# Patient Record
Sex: Male | Born: 1970 | Race: White | Hispanic: No | Marital: Single | Smoking: Former smoker
Health system: Southern US, Community
[De-identification: ages and names within clinical notes are randomized; demographics above are authoritative.]

## PROBLEM LIST (undated history)

## (undated) DIAGNOSIS — F32A Depression, unspecified: Secondary | ICD-10-CM

## (undated) DIAGNOSIS — M1990 Unspecified osteoarthritis, unspecified site: Secondary | ICD-10-CM

## (undated) DIAGNOSIS — F419 Anxiety disorder, unspecified: Secondary | ICD-10-CM

## (undated) DIAGNOSIS — K7460 Unspecified cirrhosis of liver: Secondary | ICD-10-CM

## (undated) DIAGNOSIS — L0390 Cellulitis, unspecified: Secondary | ICD-10-CM

## (undated) DIAGNOSIS — I8390 Asymptomatic varicose veins of unspecified lower extremity: Secondary | ICD-10-CM

## (undated) DIAGNOSIS — I10 Essential (primary) hypertension: Secondary | ICD-10-CM

## (undated) DIAGNOSIS — K219 Gastro-esophageal reflux disease without esophagitis: Secondary | ICD-10-CM

## (undated) DIAGNOSIS — E669 Obesity, unspecified: Secondary | ICD-10-CM

## (undated) HISTORY — PX: LIVER TRANSPLANT: SHX410

## (undated) HISTORY — DX: Essential (primary) hypertension: I10

## (undated) HISTORY — DX: Obesity, unspecified: E66.9

## (undated) HISTORY — DX: Depression, unspecified: F32.A

## (undated) HISTORY — DX: Cellulitis, unspecified: L03.90

## (undated) HISTORY — DX: Asymptomatic varicose veins of unspecified lower extremity: I83.90

## (undated) HISTORY — DX: Unspecified osteoarthritis, unspecified site: M19.90

## (undated) HISTORY — DX: Gastro-esophageal reflux disease without esophagitis: K21.9

## (undated) HISTORY — PX: CHOLECYSTECTOMY: SHX55

---

## 1998-09-06 ENCOUNTER — Emergency Department (HOSPITAL_COMMUNITY): Admission: EM | Admit: 1998-09-06 | Discharge: 1998-09-07 | Payer: Self-pay | Admitting: Emergency Medicine

## 1998-09-06 ENCOUNTER — Encounter: Payer: Self-pay | Admitting: Emergency Medicine

## 2006-02-22 ENCOUNTER — Inpatient Hospital Stay (HOSPITAL_COMMUNITY): Admission: EM | Admit: 2006-02-22 | Discharge: 2006-02-24 | Payer: Self-pay | Admitting: Emergency Medicine

## 2006-02-23 ENCOUNTER — Encounter (INDEPENDENT_AMBULATORY_CARE_PROVIDER_SITE_OTHER): Payer: Self-pay | Admitting: Specialist

## 2006-03-07 ENCOUNTER — Encounter: Admission: RE | Admit: 2006-03-07 | Discharge: 2006-03-07 | Payer: Self-pay | Admitting: Surgery

## 2006-03-28 ENCOUNTER — Observation Stay (HOSPITAL_COMMUNITY): Admission: EM | Admit: 2006-03-28 | Discharge: 2006-03-29 | Payer: Self-pay | Admitting: Emergency Medicine

## 2006-03-28 ENCOUNTER — Ambulatory Visit: Payer: Self-pay | Admitting: Family Medicine

## 2006-04-10 ENCOUNTER — Ambulatory Visit: Payer: Self-pay | Admitting: Sports Medicine

## 2006-04-25 ENCOUNTER — Ambulatory Visit: Payer: Self-pay | Admitting: Family Medicine

## 2006-05-09 ENCOUNTER — Ambulatory Visit: Payer: Self-pay | Admitting: Family Medicine

## 2006-05-25 ENCOUNTER — Ambulatory Visit: Payer: Self-pay | Admitting: Family Medicine

## 2006-08-28 ENCOUNTER — Ambulatory Visit: Payer: Self-pay | Admitting: Sports Medicine

## 2006-09-13 ENCOUNTER — Telehealth: Payer: Self-pay | Admitting: Family Medicine

## 2006-10-19 ENCOUNTER — Telehealth: Payer: Self-pay | Admitting: Family Medicine

## 2006-11-16 ENCOUNTER — Ambulatory Visit: Payer: Self-pay | Admitting: Family Medicine

## 2006-12-01 ENCOUNTER — Encounter: Payer: Self-pay | Admitting: Family Medicine

## 2006-12-08 ENCOUNTER — Ambulatory Visit: Payer: Self-pay | Admitting: Family Medicine

## 2006-12-11 ENCOUNTER — Telehealth: Payer: Self-pay | Admitting: Family Medicine

## 2007-01-05 ENCOUNTER — Encounter: Payer: Self-pay | Admitting: Family Medicine

## 2007-01-05 ENCOUNTER — Ambulatory Visit: Payer: Self-pay | Admitting: Family Medicine

## 2007-01-05 LAB — CONVERTED CEMR LAB
Alkaline Phosphatase: 48 units/L (ref 39–117)
BUN: 10 mg/dL (ref 6–23)
CO2: 24 meq/L (ref 19–32)
Creatinine, Ser: 0.82 mg/dL (ref 0.40–1.50)
Glucose, Bld: 88 mg/dL (ref 70–99)
Sodium: 140 meq/L (ref 135–145)
TSH: 1.01 microintl units/mL (ref 0.350–5.50)
Total Bilirubin: 0.7 mg/dL (ref 0.3–1.2)

## 2007-01-06 ENCOUNTER — Encounter: Payer: Self-pay | Admitting: Family Medicine

## 2007-01-15 ENCOUNTER — Telehealth: Payer: Self-pay | Admitting: Family Medicine

## 2007-01-30 ENCOUNTER — Telehealth: Payer: Self-pay | Admitting: *Deleted

## 2007-02-27 ENCOUNTER — Telehealth: Payer: Self-pay | Admitting: Family Medicine

## 2007-03-21 IMAGING — CR DG CHEST 1V PORT
1 series · 1 of 1 positions shown · non-contrast
Comparison: none

CLINICAL DATA: Chest pain. 
 PORTABLE CHEST - 1 VIEW ? 03/28/06:

[view not recorded]
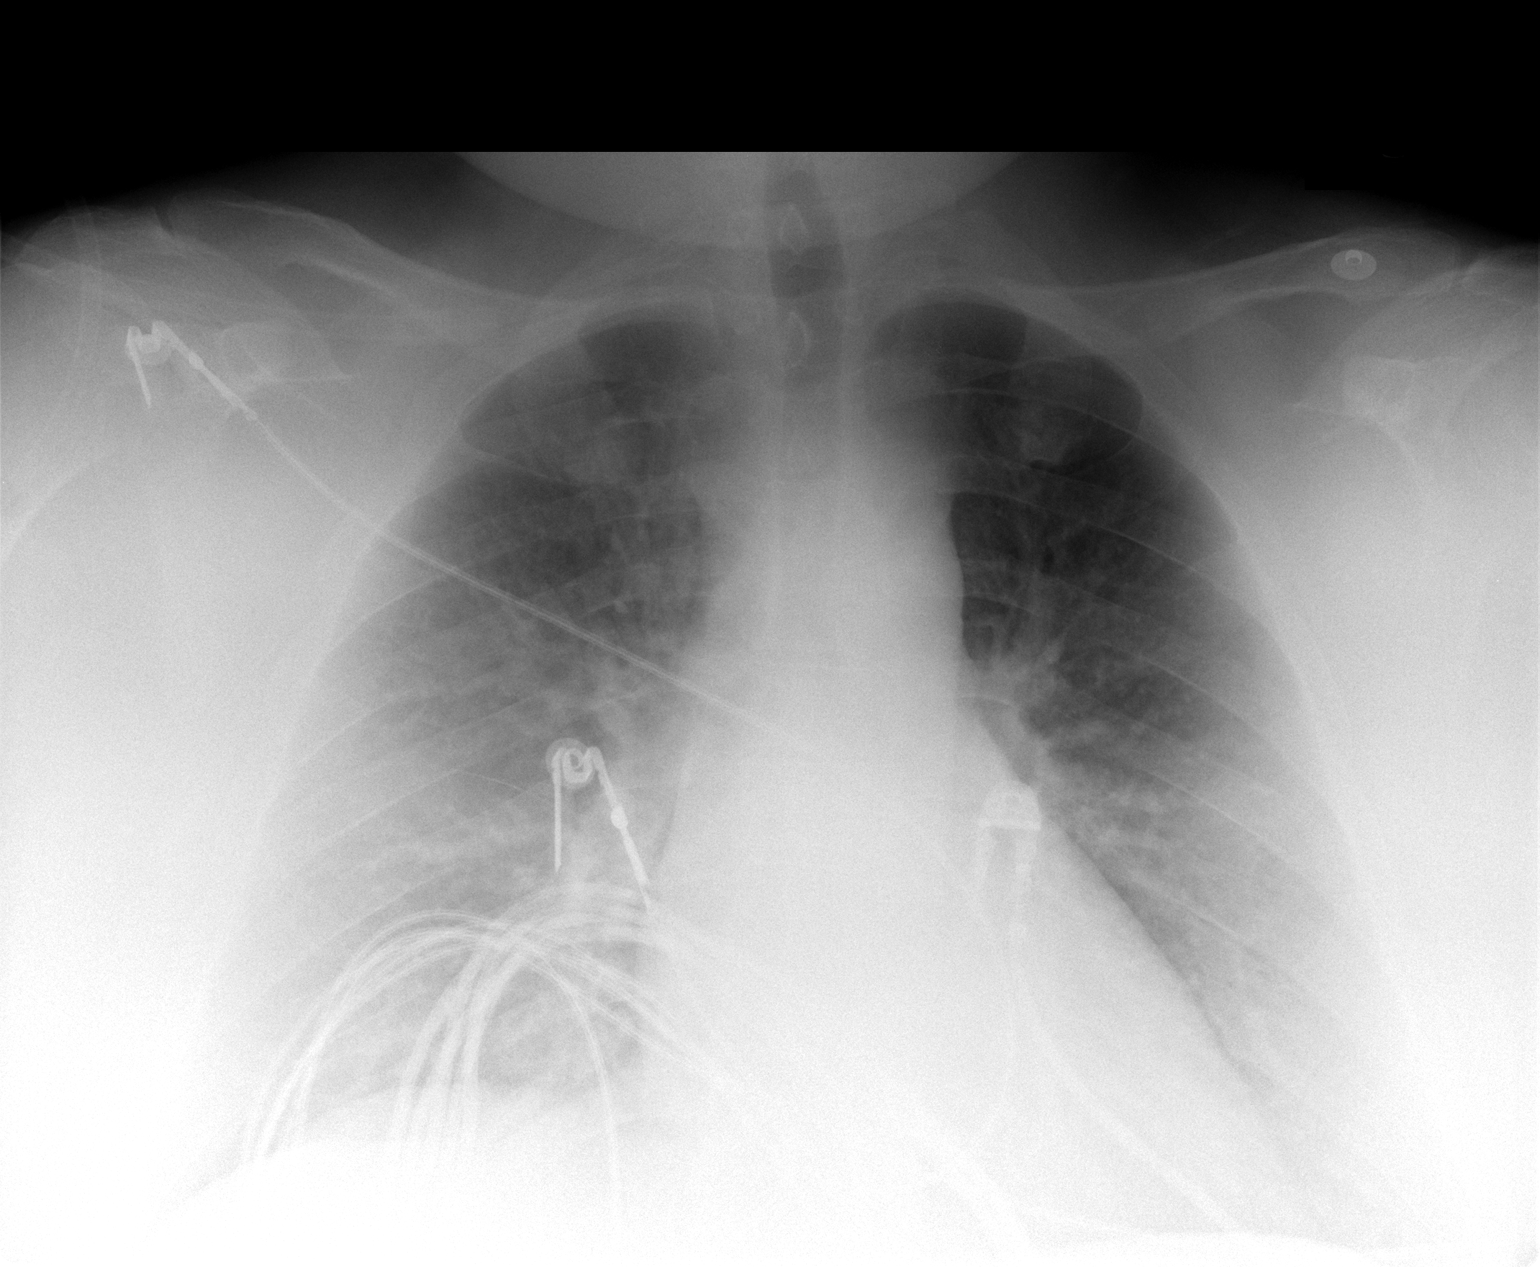

[1 of 1 positions shown; findings below may reference images not displayed]

FINDINGS: Heart size and vascularity are normal and the lungs are clear.  No appreciable bony abnormality of significance.
IMPRESSION: No acute disease.

## 2007-04-04 ENCOUNTER — Telehealth: Payer: Self-pay | Admitting: *Deleted

## 2007-05-09 ENCOUNTER — Ambulatory Visit: Payer: Self-pay | Admitting: Family Medicine

## 2007-05-23 ENCOUNTER — Telehealth: Payer: Self-pay | Admitting: Family Medicine

## 2007-07-25 ENCOUNTER — Telehealth (INDEPENDENT_AMBULATORY_CARE_PROVIDER_SITE_OTHER): Payer: Self-pay | Admitting: *Deleted

## 2007-09-10 ENCOUNTER — Encounter: Payer: Self-pay | Admitting: Family Medicine

## 2007-11-20 ENCOUNTER — Telehealth: Payer: Self-pay | Admitting: *Deleted

## 2007-11-28 ENCOUNTER — Ambulatory Visit: Payer: Self-pay | Admitting: Family Medicine

## 2007-12-11 ENCOUNTER — Telehealth: Payer: Self-pay | Admitting: *Deleted

## 2008-01-03 ENCOUNTER — Telehealth: Payer: Self-pay | Admitting: Family Medicine

## 2008-02-07 ENCOUNTER — Telehealth: Payer: Self-pay | Admitting: Family Medicine

## 2008-04-11 ENCOUNTER — Telehealth: Payer: Self-pay | Admitting: *Deleted

## 2008-06-04 ENCOUNTER — Ambulatory Visit: Payer: Self-pay | Admitting: Family Medicine

## 2009-03-28 ENCOUNTER — Encounter (INDEPENDENT_AMBULATORY_CARE_PROVIDER_SITE_OTHER): Payer: Self-pay | Admitting: *Deleted

## 2009-03-28 DIAGNOSIS — F17200 Nicotine dependence, unspecified, uncomplicated: Secondary | ICD-10-CM | POA: Insufficient documentation

## 2010-06-10 ENCOUNTER — Encounter: Payer: Self-pay | Admitting: Family Medicine

## 2010-06-29 ENCOUNTER — Emergency Department (HOSPITAL_COMMUNITY)
Admission: EM | Admit: 2010-06-29 | Discharge: 2010-06-29 | Payer: Self-pay | Source: Home / Self Care | Admitting: Emergency Medicine

## 2010-07-20 NOTE — Assessment & Plan Note (Signed)
Summary: FU/KH   Vital Signs:  Patient Profile:   40 Years Old Male Height:     71.5 inches Weight:      336 pounds Temp:     98.3 degrees F Pulse rate:   80 / minute BP sitting:   121 / 54  (left arm)  Pt. in pain?   yes    Location:   back and neck    Intensity:   6    Type:       tense  Vitals Entered By: Lillia Pauls CMA (December 08, 2006 1:43 PM)              Is Patient Diabetic? No   PCP:  Norton Blizzard MD  Chief Complaint:  f/u back and neck pain.  History of Present Illness: Patient returns today for follow-up of his anxiety, back pain and diarrhea.  He states that his anxiety is much worse after tapering off the prozac.  He is now not taking anything.  Was previously on clonazepam and prozac.  His back pain is now up to his neck but has not changed - still involves the muscle and can be so bad that he can not get out of bed in the morning.  Have discussed with him many times it is worse to do this as it will lead to continued spasming and a downward spiral.  He lifts books as his weights (no weights at home) and walks around house as much as possible.  His diarrhea is much more manageable off prozac - only has it 1-2 times a week now.  Is avoiding fatty foods as much as possible.  Uses pepto bismol as needed.  Has an appointment to see Jaynee Eagles next week for assistance.  Medications reviewed and updated in Dr. Tiajuana Amass.  Current Allergies (reviewed today): No known allergies       Physical Exam  General:     Well-developed,well-nourished,in no acute distress; alert,appropriate and cooperative throughout examination Neck:     No deformities, masses, or tenderness noted. Lungs:     Normal respiratory effort, chest expands symmetrically. Lungs with rhonchi bilaterally - better with cough but still present.  No rales or wheezes. Heart:     Normal rate and regular rhythm. S1 and S2 normal without gallop, murmur, click, rub or other extra sounds. Msk:  No deformity or scoliosis noted of thoracic or lumbar spine.  Has tenderness paraspinally throughout back.  No spinal tenderness.    Impression & Recommendations:  Problem # 1:  ANXIETY (ICD-300.00) Assessment: Deteriorated Starting clonazepam 1mg  two times a day x 1 month with bridge to buspar.  Again discussed clonazepam is only short term medication.  Now off prozac. Orders: [facility]- Est  Level 4 ([id number])   Problem # 2:  BACK PAIN (ICD-724.5) Assessment: Deteriorated Musculoskeletal etiology.  No red flags.  To take tylenol as needed for pain.  No more darvocet or narcotics.  Advised him to do strengthening exercises - wall pushups, rows, bench press, butterfly.  To do 20 reps with little weight for tone.  Advised may hurt more in short term but to work through the pain. Orders: [facility]- Est  Level 4 ([id number])   Problem # 3:  DIARRHEA, CHRONIC (ICD-787.91) Assessment: Improved Improved.  Advised patient that if this continues, may start medication for IBS. Orders: [facility]- Est  Level 4 ([id number])    Patient Instructions: 1)  It was great to see you again Mr. Slagel, keep up the good  work at home. 2)  Start clonazepam at 1mg  twice a day while increasing buspar.  3)  Take tylenol 650mg  every 4 hours as needed for pain (only use for severe pain though). 4)  Work on toning exercises instead of for bulk - target should be enough weight to do 20 repetitons of an exercise - do bench presses, butterfly, rows with weights twice a day.  Do pushups or wall pushups too.  Expect to be more sore initially but this will tone your muscles and take some pressure off your back ligaments.  Work through the pain - the more you lie around, the worse you will feel and studies have proven this - we used to recommend rest for back pain but this is no longer the case. 5)  Make a diary of what exercises you do every day and bring it to your next appointment. 6)  Read the handout on other ways to help your back. 7)  Watch  for any new numbness, weakness, problems with bowel or bladder function, loss of sensation around your bottom - call for any of these. 8)  Please schedule a follow-up appointment in 1 month.

## 2010-07-22 NOTE — Miscellaneous (Signed)
  Clinical Lists Changes  Problems: Removed problem of URI (ICD-465.9) Removed problem of IMPACTED CERUMEN (ICD-380.4) Removed problem of SEBACEOUS CYST (ICD-706.2) Removed problem of ANXIETY (ICD-300.00)

## 2010-11-05 NOTE — Discharge Summary (Signed)
NAME:  Erik Howe, Erik Howe NO.:  [id number]   MEDICAL RECORD NO.:  [id number]          PATIENT TYPE:  INP   LOCATION:  3715                         FACILITY:  [facility]   PHYSICIAN:  Ruthe Mannan, M.D.       DATE OF BIRTH:  1970/12/27   DATE OF ADMISSION:  03/28/2006  DATE OF DISCHARGE:  03/29/2006                                 DISCHARGE SUMMARY   CONSULTANTS:  None.   PROCEDURES:  1. EKG which showed regular sinus rhythm.  2. Chest x-ray normal; no acute findings.   DISCHARGE DIAGNOSES:  1. Chest pain noncardiac in origin, likely can be attributed to GERD or      chronic pain syndrome.  2. Abdominal pain status post cholecystectomy in September of 2007, likely      chronic pain issue.  3. Back pain chronic.  4. Ichthyosis.  5. Tobacco abuse.   DISCHARGE MEDICATIONS:  1. Omeprazole 20 mg p.o. every day.  2. Darvocet-N 100 one tablet q.4 h. p.r.n. pain.  3. Simvastatin 10 mg one tablet p.o. daily.   DISPOSITION:  Patient will be discharged home.  He lives with his mother who  he supports.  Patient does not work.  Encouraged moving around.   DISCHARGE FOLLOWUP:  With Dr. Norton Blizzard, phone number [phone], on  October 27th at 3:30.   FOLLOWUP ISSUES:  Patient is chronically taking Vicodin at home for chronic  back pain.  Recommended transitioning to Darvocet, as we do not want him to  be on chronic narcotics.  We will also need to follow liver function  periodically since we just started him on a statin during his hospital  admission.   DISCHARGE CONDITION:  Stable.   DISCHARGE LABS:  Troponin less than .01 x3, lipase 28, amylase 71, bilirubin  1.8, white count 9.4, hemoglobin 15.8, hematocrit 46.1, lipid profile showed  a total cholesterol of 197, triglycerides of 154, HDL of 32 and LDL of 134,  TSH within normal limits, hemoglobin A1c of 4.9.   PENDING LABS:  None.   HOSPITAL COURSE:  1. Chest pain at admission.  First EKG was normal, we then  repeated two      sets of cardiac enzymes which were both negative in the emergency      department.  We did not think this was likely cardiac in origin, but we      still admitted him to rule out MI.  We also wanted to rule out any      other possible sources.  We checked an amylase and lipase which were      also within normal limits.  We also risk stratified him for cardiac      risk factors considering the fact that he is obese and is a smoker.      His A1c was within normal limits with slightly elevated lipids and      triglycerides.  We, therefore, started him on low-dose simvastatin and      ordered two more sets of cardiac enzymes.  The morning of his  discharge, it was deemed that his chest pain had resolved and thought      that perhaps the Protonix that we gave him for prophylaxis may be      helping for possible GERD.  We, therefore, only ordered one more set of      cardiac enzymes which is negative for a total of three sets of cardiac      enzymes which were within normal limits.  We will continue patient on      omeprazole as an outpatient for possible GERD.  2. Abdominal pain.  Pain was resolving, we monitored it.  We do not think      it was an acute process; this is likely chronic in origin.  3. Tobacco abuse.  Patient states that he is going to try to cut back.  He      would like to discuss it further as an outpatient and perhaps need some      help either with Chantix or maybe a nicotine patch.  4. Ichthyosis.  Patient states that he has had this problem for a very      long time.  Suggested adding Vaseline on his legs or perhaps Eucerin      cream.           ______________________________  Ruthe Mannan, M.D.     TA/MEDQ  D:  03/29/2006  T:  03/29/2006  Job:  [id number]   cc:   Norton Blizzard, M.D.

## 2010-11-05 NOTE — Discharge Summary (Signed)
NAME:  MELROY, BOUGHER NO.:  [id number]   MEDICAL RECORD NO.:  [id number]          PATIENT TYPE:  INP   LOCATION:  5128                         FACILITY:  [facility]   PHYSICIAN:  Velora Heckler, MD      DATE OF BIRTH:  Dec 09, 1970   DATE OF ADMISSION:  02/22/2006  DATE OF DISCHARGE:  02/24/2006                               DISCHARGE SUMMARY   REASON FOR ADMISSION:  Acute cholecystitis, cholelithiasis.   BRIEF HISTORY:  The patient is a 40 year old white male presents to the  emergency department for 1 week history of right shoulder pain.  He  developed nausea, vomiting, and epigastric abdominal pain on the day of  admission.  He was evaluated in the emergency department at [facility] and noted to have an elevated white blood cell count.  CT scan  abdomen and pelvis was obtained which showed findings consistent with  acute appendicitis with acute cholecystitis.  General surgery was  consulted.   The patient was admitted on the evening of September 5 to the general  surgical service.  He was started on intravenous antibiotics and treated  for pain control and management of nausea and vomiting.  The patient was  prepared and brought to the operating room on the afternoon of February 23, 2006.  He underwent laparoscopic cholecystectomy with intraoperative  cholangiography.  Postoperatively, the patient did well.  He started  clear liquids and advanced to a regular diet rapidly.  He received 24  hours of intravenous antibiotics.  The patient was discharged home on  the morning of February 24, 2006.   DISCHARGE PLANNING:  The patient is discharged home February 24, 2006,  in good condition, tolerating a regular diet and ambulating  independently.   DISCHARGE MEDICATIONS:  Percocet p.r.n. pain.   The patient will return to my office for follow up in 2-3 weeks.   FINAL DIAGNOSIS:  Acute cholecystitis, cholelithiasis.   CONDITION AT DISCHARGE:   Good.      Velora Heckler, MD  Electronically Signed     TMG/MEDQ  D:  05/08/2006  T:  05/08/2006  Job:  [id number]

## 2010-11-05 NOTE — H&P (Signed)
NAME:  Erik Howe NO.:  [id number]   MEDICAL RECORD NO.:  [id number]          PATIENT TYPE:  INP   LOCATION:  5128                         FACILITY:  [facility]   PHYSICIAN:  Thornton Park. Daphine Deutscher, MD  DATE OF BIRTH:  [birth year]   DATE OF ADMISSION:  02/22/2006  DATE OF DISCHARGE:                                HISTORY & PHYSICAL   CHIEF COMPLAINT:  Upper abdominal pain and cholecystitis.   HISTORY:  Erik Howe is a 40 year old white male who presented to the  emergency room via ambulance this morning at 10:30 complaining of abdominal  pain, nausea and vomiting.  He describes about a week long history of right  shoulder pain, then today the pain was more in his epigastrium and was much  more severe and associated with nausea, vomiting and gagging.  He came to  the ER where he was seen initially by Dr. Verlan Friends and subsequently evaluated  by Dr. Effie Shy who asked me to see him.  Gallbladder ultrasound was obtained  which showed some gallstones but it was not until he had a CT scan that  showed gallbladder wall thickening.   PAST MEDICAL HISTORY:  He has had no prior surgery except for teeth  extractions.   SOCIAL HISTORY:  Positive for cigarette smoking but no drug abuse.  Occasionally he drinks.   FAMILY HISTORY:  His father is deceased.  His mother is alive and  chronically ill.  He actually quit work to come home, live with her and take  care of her.   REVIEW OF SYSTEMS:  Essentially noncontributory and unremarkable except for  positive for dry skin.   PHYSICAL EXAMINATION:  Afebrile, blood pressure 145/93, pulse 72,  respirations 22.  HEENT:  Head:  Normocephalic.  Sclerae are nonicteric.  Pupils are equal,  round, and reactive to light.  Nose:  Exam unremarkable.  Throat:  The  patient has false teeth.  NECK:  Supple without adenopathy.  CHEST:  Clear to auscultation.  HEART:  Sinus rhythm without murmurs or gallops.  ABDOMEN:  Tender to palpation in the  right upper quadrant with some  guarding.  No other localizing finding.  SKIN:  He has very dry skin over his elbows and he denies psoriasis.  EXTREMITIES:  Full range of motion and no evidence of a previous DVT or  history thereof.   LABORATORY DATA:  Includes a hemoglobin of 18.1, white count 12,700.  Electrolytes were normal with a sodium of 139 and potassium 4.  Creatinine  is 1.  Total bilirubin is 0.9 and alkaline phosphate is within normal range.  Lipase was 34 which is the normal range.  Urinalysis was unremarkable.  The  patient had a chest x-ray which showed prominent interstitial lung markings  with no focal opacity.  Ultrasound showed gallstones.  Also fatty liver was  noted.  CT scan showed gallbladder wall thickening.  It was consistent with  acute cholecystitis.   An open cholecystectomy was discussed with him and his mother.  We will try  to schedule him for elective laparoscopic cholecystectomy  tomorrow per the  [facility] service.      Thornton Park Daphine Deutscher, MD  Electronically Signed     MBM/MEDQ  D:  02/22/2006  T:  02/23/2006  Job:  [id number]

## 2010-11-05 NOTE — H&P (Signed)
NAME:  Erik Howe, Erik Howe NO.:  [id number]   MEDICAL RECORD NO.:  [id number]          PATIENT TYPE:  INP   LOCATION:  3715                         FACILITY:  [facility]   PHYSICIAN:  Barth Kirks, M.D.  DATE OF BIRTH:  [birth year]   DATE OF ADMISSION:  03/28/2006  DATE OF DISCHARGE:                                HISTORY & PHYSICAL   CHIEF COMPLAINT:  Chest pain.   HISTORY OF PRESENT ILLNESS:  The patient is a 40 year old morbidly obese  white male who presents with a 57-month history of radiating pain beginning  after his cholecystectomy.  The patient states that the pain started in his  abdomen then it has moved to his back and then to his chest.  It has  worsened over the weekend to where it now feels like it has settled in his  chest.  The patient describes this new chest pain as a sitting type  pressure.  He denies any diaphoresis.  He does have some nausea, but no  vomiting.  He states the pain is an 8/10 at worst.  The radiating pain comes  from his right upper quadrant to his middle quadrant and goes directly into  his back and then comes and goes to his upper chest area.  Currently, he is  chest pain free and the pain is only in his back.  He has had no recent  weight loss.  Positive diarrhea.  Positive increased urinary frequency.  The  patient has baseline dyspnea secondary to his size with minimal exertion.  The patient denies any leg edema, cough, fevers, weight loss or weight gain.  The patient does state he has dry skin that is a chronic issue.   PAST MEDICAL HISTORY:  Significant for a cholecystectomy back in 02/2006  diagnosed by abdominal CT and had a history of gallstones.   MEDICATIONS:  He uses Vicodin p.r.n.   ALLERGIES:  No known drug allergies.   SOCIAL HISTORY:  He is a smoker.  He smokes 1/2 pack per day.  He uses  alcohol occasionally.  No drugs.  He has been unemployed x5 years.  He lives  with his mom and takes care of his mom.  He lives  off his mom's pension.  The patient graduated high school and went to college for approximately a  couple of classes and since then has been living at home and been  unemployed.  He had a recent job in retail; however, has not been working  currently and he looks with minimal hygiene.  His mother actually is walking  around with a cane and looks like she has been taking care of him for the  past 1 month.   FAMILY HISTORY:  Father had CVA, emphysema, irregular heart beat and died at  age 11.  Mother has hypertension and diabetes and angina.  He has 1 brother  and 1 sister, both are healthy.   PHYSICAL EXAMINATION:  VITAL SIGNS:  T-max is 98.3, T-current 97.5, pulse 68  to 80, blood pressure 111/72 and 112/78, respirations were 20 to 22,  sats  were 100% on 2 liters nasal cannula.  GENERAL:  He looks troubled.  He has difficulty sitting upright but he is  dizzy with sitting.  He is in no acute distress.  HEENT:  Normocephalic, atraumatic.  Extraocular muscles intact.  Dry mucous  membranes.  Poor dentition.  Obese.  No neck.  CARDIOVASCULAR:  Regular rate and rhythm.  No murmurs, rubs or gallops.  There are +2 dorsalis pedis pulses bilaterally.  No JVD.  LUNGS:  Moderate wheezing bilaterally with positive rhonchi at the bases  bilaterally.  ABDOMEN:  Soft, obese, tender to palpation in the right upper quadrant,  worst positive tenderness in the middle abdomen and left lower quadrant.  He  does have positive bowel sounds.  No rebound tenderness.  BACK:  He has tenderness to palpation over his lumbar spine and bilateral  CVA.  CHEST:  He does have positive tenderness to palpation over his chest area  over the sternal region and bilateral areas of his chest; however, he  describes this as a different type pain.  EXTREMITIES:  He has chronic venostasis changes and dry skin that is in a  scaly-type pattern.  FEET:  He has poor hygiene, bunions present and poor nail cleanliness with  some  noted onychomycosis.  NEUROLOGIC:  He is alert and oriented x3 and normal sensation.   LABORATORY DATA:  Sodium is 138, potassium 3.6, chloride 104, bicarb 25.6,  BUN of 5, creatinine of 0.7, glucose of 82.  Hemoglobin 16.7, hematocrit was  49.  Venous blood gas was normal.  D-dimer was negative.  Two sets of  cardiac enzymes, I-stats in the ED were negative.  EKG was normal sinus  rhythm, no acute changes.   ASSESSMENT AND PLAN:  A 40 year old white male morbidly obese with an odd  pain pattern, inconsistent location including chest, back and abdomen.  Recently status post cholecystectomy now with bilateral wheezing.   1. Chest pain.  This is atypical.  We will check cardiac enzymes q.8h. x3      and repeat EKG in the morning due to his risk factors of tobacco and      obesity and sedentary lifestyle.  He does have some dyspnea at rest.      We will check a TSH and a fasting lipid profile to risk stratify him.      We also consider obesity hypoventilation syndrome with sleep apnea      secondary to size and a restrictive lung pattern.  We will give him      albuterol 5 mg dose q.4h. p.r.n.  His pain could also be status post      surgical pain versus pleuritis.  2. Abdominal pain.  We will give him p.r.n. morphine 2 mg IV q.4h. for      pain.  We will check a lipase and amylase; however, this is not likely      pancreatitis due to no vomiting.  He is able to eat but he does state      that the pain radiates from front to back.  This may be chronic      abdominal pain that was present prior to his prior cholecystectomy and      was not related to biliary colic.  This  may be something like      flatulent dyspepsia.  It is not likely aortic dissection due to normal      blood pressures and due to the fact that it  is a radiating pain that      moves from his chest to the back to the stomach.  3. Ichthyosis.  We will apply Vaseline daily.  This is a chronic condition     that is currently  stable.  4. Increased urinary frequency.  Will check a urinalysis, a Gram stain and      a culture.  Will check a hemoglobin A1c.  There is some concern for      diabetes versus urinary tract infection.  5. Deep venous thrombosis prophylaxis.  Pulmonary embolism is not likely      secondary to D-dimer.  We will place him on Lovenox, however, due to      his sedentary lifestyle and his obesity.  We will place TED hose      bilaterally to the knees.  6. Gastrointestinal prophylaxis.  We will place him on Protonix.  7. Venous insufficiency.  This is a chronic issue.  8. Social work consult to help for financial resources due to the fact      that he is unemployed and currently living off his mother's pension.      There may be some concern that this patient may be attempting to get      disability due to his lack of income source and vague odd pain pattern.  9. Tobacco abuse.  Will have a smoking cessation consult.      Barth Kirks, M.D.     MB/MEDQ  D:  03/28/2006  T:  03/29/2006  Job:  [id number]

## 2010-11-05 NOTE — Op Note (Signed)
NAME:  GAYLORD, SEYDEL NO.:  [id number]   MEDICAL RECORD NO.:  [id number]          PATIENT TYPE:  INP   LOCATION:  5128                         FACILITY:  [facility]   PHYSICIAN:  Velora Heckler, MD      DATE OF BIRTH:  [birth year]   DATE OF PROCEDURE:  02/23/2006  DATE OF DISCHARGE:                                 OPERATIVE REPORT   PREOPERATIVE DIAGNOSIS:  Acute cholecystitis, cholelithiasis   POSTOPERATIVE DIAGNOSIS:  Acute cholecystitis, cholelithiasis   PROCEDURE:  Laparoscopic cholecystectomy with interoperative  cholangiography.   SURGEON:  Velora Heckler, M.D., FACS   ASSISTANT:  Junious Silk, NP   ANESTHESIA:  General.   ESTIMATED BLOOD LOSS:  50 mL.   PREPARATION:  Betadine.   COMPLICATIONS:  None.   INDICATIONS:  The patient is a 40 year old white male with a one week  history of right shoulder pain.  He developed nausea, vomiting, epigastric  abdominal pain and presented to the emergency department for assessment.  The patient was noted to have an elevated white blood cell count.  CT scan  of the abdomen and pelvis showed findings consistent with acute  cholecystitis.  The patient was started on intravenous antibiotics and  admitted to the general surgical service.  He is now brought to the  operating room for cholecystectomy.   BODY OF REPORT:  The procedure is done in OR17 at [facility].  The  patient is brought to the operating room and placed in the supine position  on the operating room table.  Following the administration of general  anesthesia, the patient is prepped and draped in the usual strict aseptic  fashion.  After ascertaining that an adequate level of anesthesia been  obtained, an infraumbilical incision was made in the midline with a #15  blade.  Dissection was carried down to the fascia.  The fascia was incised  in the midline.  The peritoneal cavity is entered cautiously.  0 Vicryl  pursestring suture was placed in  the fascia.  The Hasson cannula was  introduced and secured with the pursestring suture.  The abdomen was  insufflated with carbon dioxide.  The laparoscope was introduced under  direct vision.  There is an acutely inflamed distended gallbladder in the  right upper quadrant.  Operative ports were placed along the right costal  margin in the midline, midclavicular line, and anterior axillary line.  The  fundus of the gallbladder was grasped and retracted cephalad.  Omental  adhesions to the gallbladder are taken down with gentle blunt dissection.  Hemostasis was obtained with electrocautery.  The neck of the gallbladder is  identified.  Dissection was carried down to the cystic duct.  A clip was  placed across the neck of the gallbladder.  The cystic duct is incised.  Black dark bile with flecks of debris emanate from the cystic duct.  A Cook  cholangiography catheter was introduced through a stab wound in the right  upper quadrant.  It was advanced into the cystic duct and secured with a  Liga clip.  Using C-arm fluoroscopy, real time cholangiography was  performed.  There is a long cystic duct.  There is a normal caliber common  bile duct.  There is flow of contrast distally although it is difficult to  see contrast entering the duodenum.  There are no filling defects.  There is  no obvious evidence of obstruction.  There is filling of both the right and  left hepatic ductal systems.   The clip was withdrawn and the [facility] catheter was removed from the peritoneal  cavity.  The cystic duct is triply clipped and divided.  The cystic artery  was dissected out, doubly clipped, and divided.  The posterior branch of the  cystic artery was doubly clipped and divided.  The gallbladder was then  excised from the gallbladder bed using the hook electrocautery for  hemostasis.  The gallbladder was completely extracted.  It was placed into  an EndoCatch bag.  It is withdrawn through the umbilical port  without  difficulty.  The right upper quadrant was irrigated with warm saline which  was evacuated.  Good hemostasis is achieved in the gallbladder bed.  Surgicel was placed in the gallbladder bed.  Fluid was evacuated.  Ports  were removed under direct vision.  Good hemostasis was noted at all four  port sites.  Pneumoperitoneum was released.  0 Vicryl pursestring suture was  tied securely.  The port sites were anesthetized with local anesthetic.  The  wounds were closed with interrupted 4-0 Vicryl subcuticular sutures.  The  wounds were washed and dried.  Benzoin and Steri-Strips were applied.  Sterile dressings were applied.  The patient is awakened from anesthesia and  brought to the recovery room in stable condition.  The patient tolerated the  procedure well.      Velora Heckler, MD  Electronically Signed     TMG/MEDQ  D:  02/23/2006  T:  02/23/2006  Job:  [id number]

## 2010-12-03 ENCOUNTER — Encounter: Payer: Self-pay | Admitting: Family Medicine

## 2010-12-03 ENCOUNTER — Ambulatory Visit (INDEPENDENT_AMBULATORY_CARE_PROVIDER_SITE_OTHER): Payer: Self-pay | Admitting: Family Medicine

## 2010-12-03 DIAGNOSIS — L300 Nummular dermatitis: Secondary | ICD-10-CM

## 2010-12-03 DIAGNOSIS — F329 Major depressive disorder, single episode, unspecified: Secondary | ICD-10-CM

## 2010-12-03 DIAGNOSIS — L309 Dermatitis, unspecified: Secondary | ICD-10-CM

## 2010-12-03 DIAGNOSIS — Q809 Congenital ichthyosis, unspecified: Secondary | ICD-10-CM

## 2010-12-03 DIAGNOSIS — L259 Unspecified contact dermatitis, unspecified cause: Secondary | ICD-10-CM

## 2010-12-03 MED ORDER — TRIAMCINOLONE ACETONIDE 0.5 % EX OINT: TOPICAL_OINTMENT | Freq: Two times a day (BID) | CUTANEOUS | Status: DC

## 2010-12-03 MED ORDER — AMMONIUM LACTATE 12 % EX LOTN: TOPICAL_LOTION | CUTANEOUS | Status: DC

## 2010-12-03 MED ORDER — FLUOXETINE HCL 20 MG PO CAPS: 20.0000 mg | ORAL_CAPSULE | Freq: Every day | ORAL | Status: DC

## 2010-12-03 NOTE — Progress Notes (Signed)
  Subjective:    Patient ID: NYHEEM BINETTE, male    DOB: 02/11/1971, 40 y.o.   MRN: [id number]  HPI R hand rash and irritation x 4 weeks. Has had this as a recurrent issue over the last year. 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 No hx/o fungal infections. No known hx/o eczema per pt.  No known irritant contacts per pt. Currently unemployed. Helps take care of mother at home, who is disabled. Hand generally itchy on dorsum of R hand and interdigital folds.  Mood: Pt previously on prozac in the past. Has been off > 1 year per pt. Has noted general depressed mood. No HI/SI. Feels that he may need to be restarted on medication.   Review of Systems  All other systems reviewed and are negative.   Review of Systems  All other systems reviewed and are negative.      Objective:   Physical Exam Gen: up in chair, obese,  CV: RRR, no rubs, gallops, murmurs, R Hand: Generalized R hand scaling, most prominent over distal digits,  + distal digit cracking/fissures over scales.     Assessment & Plan:  Nummular eczema: Case precepted with Dr. Sheffield Slider.  Pt placed on triamcinolone hand cream for treatment. Pt instucted to avoid excessive hand washing.  Depression: Will restart pt on prozac for symptomatic improvement. Will follow up in 2 weeks.

## 2010-12-03 NOTE — Patient Instructions (Signed)
It was good to see you today  I am starting you on triamcinolone ointment for you your hand Try to avoid lanocane as this may be the trigger for your rash I am also restarting you on prozac for your mood  Come back to see me in 2 weeks for follow up on your hand rash and mood Call with any other questions, God Bless, Doree Albee MD

## 2010-12-05 DIAGNOSIS — F329 Major depressive disorder, single episode, unspecified: Secondary | ICD-10-CM | POA: Insufficient documentation

## 2010-12-05 NOTE — Assessment & Plan Note (Signed)
Will restart pt on prozac for symptomatic improvement. Will follow up in 2 weeks

## 2010-12-05 NOTE — Assessment & Plan Note (Signed)
Case precepted with Dr. Sheffield Slider. Pt placed on triamcinolone hand cream for treatment. Pt instucted to avoid excessive hand washing

## 2010-12-14 ENCOUNTER — Ambulatory Visit: Payer: Self-pay | Admitting: Family Medicine

## 2011-01-11 ENCOUNTER — Ambulatory Visit (HOSPITAL_COMMUNITY)
Admission: RE | Admit: 2011-01-11 | Discharge: 2011-01-11 | Disposition: A | Discharge status: Home / Self Care | Payer: Self-pay | Source: Ambulatory Visit | Attending: Family Medicine | Admitting: Family Medicine

## 2011-01-11 ENCOUNTER — Encounter: Payer: Self-pay | Admitting: Family Medicine

## 2011-01-11 ENCOUNTER — Ambulatory Visit (INDEPENDENT_AMBULATORY_CARE_PROVIDER_SITE_OTHER): Payer: Self-pay | Admitting: Family Medicine

## 2011-01-11 VITALS — BP 150/86 | HR 102 | Temp 98.8°F | Wt 343.0 lb

## 2011-01-11 DIAGNOSIS — R0602 Shortness of breath: Secondary | ICD-10-CM | POA: Insufficient documentation

## 2011-01-11 DIAGNOSIS — R062 Wheezing: Secondary | ICD-10-CM | POA: Insufficient documentation

## 2011-01-11 NOTE — Progress Notes (Signed)
Subjective:     Erik Howe is a 40 y.o. male who presents with shortness of breath. Symptoms occur with one block walking. Symptoms began 3 weeks ago, gradually worsening since. Associated symptoms include  constant cough, dyspnea on exertion and pain with deep inspiration or cough.. He denies edema  or pain of lower extremities. , hemoptysis  and tightness in chest. He has not had recent travel. Weight has been stable. Symptoms are exacerbated by any exercise. Symptoms are alleviated by rest.   Previous Report Reviewed: historical medical records   The following portions of the patient's history were reviewed and updated as appropriate: allergies, current medications, past family history, past medical history, past social history, past surgical history and problem list.  Review of Systems Pertinent items are noted in HPI.    Objective:    BP 150/86  Pulse 102  Temp(Src) 98.8 F (37.1 C) (Oral)  Wt 343 lb (155.584 kg)  SpO2 94% General appearance: alert, cooperative and fatigued Eyes: conjunctivae/corneas clear. PERRL, EOM's intact. Fundi benign. Ears: normal TM's and external ear canals both ears Nose: mild congestion Throat: lips, mucosa, and tongue normal; teeth and gums normal Neck: no adenopathy, no carotid bruit, no JVD, supple, symmetrical, trachea midline and thyroid not enlarged, symmetric, no tenderness/mass/nodules Lungs: wheezes RUL and otherwise clear.  Chest wall: Tenderness with palpation of sternum Heart: regular rate and rhythm, S1, S2 normal, no murmur, click, rub or gallop Extremities: No edema, no calf tenderness.   Diagnostic Review Chest x-ray: Ordered Pulse oximetry, exercise, 94%     Assessment:   Concern for PNA vs. bronchitis  Plan:    Chest x-ray..  Will also order CBC with diff to evaluate for infection.   Will rule out heart failure with BNP.

## 2011-01-11 NOTE — Patient Instructions (Addendum)
It was good to meet you.  I am sorry you have not been feeling good. I am going to check some lab tests and a chest x-ray. I will call you with your chest x-ray results.  The chest x-ray will help me decide therapy, I will let you know if you need to take any medications when I see those results.

## 2011-01-12 ENCOUNTER — Telehealth: Payer: Self-pay | Admitting: Family Medicine

## 2011-01-12 DIAGNOSIS — R062 Wheezing: Secondary | ICD-10-CM

## 2011-01-12 LAB — CBC WITH DIFFERENTIAL/PLATELET
Basophils Absolute: 0.1 10*3/uL (ref 0.0–0.1)
Basophils Relative: 1 % (ref 0–1)
HCT: 53.9 % — ABNORMAL HIGH (ref 39.0–52.0)
Hemoglobin: 18.2 g/dL — ABNORMAL HIGH (ref 13.0–17.0)
Lymphocytes Relative: 32 % (ref 12–46)
MCHC: 33.8 g/dL (ref 30.0–36.0)
Monocytes Absolute: 1.2 10*3/uL — ABNORMAL HIGH (ref 0.1–1.0)
Monocytes Relative: 12 % (ref 3–12)
Neutro Abs: 5.1 10*3/uL (ref 1.7–7.7)
Neutrophils Relative %: 51 % (ref 43–77)
WBC: 9.9 10*3/uL (ref 4.0–10.5)

## 2011-01-12 LAB — BRAIN NATRIURETIC PEPTIDE: Brain Natriuretic Peptide: 2.9 pg/mL (ref 0.0–100.0)

## 2011-01-12 MED ORDER — ALBUTEROL SULFATE HFA 108 (90 BASE) MCG/ACT IN AERS: 2.0000 | INHALATION_SPRAY | Freq: Four times a day (QID) | RESPIRATORY_TRACT | Status: DC | PRN

## 2011-01-12 MED ORDER — GUAIFENESIN ER 600 MG PO TB12: 1200.0000 mg | ORAL_TABLET | Freq: Two times a day (BID) | ORAL | Status: DC

## 2011-01-12 NOTE — Telephone Encounter (Signed)
Notified pt no PNA on CXR, WBC count normal.  Advised likely Viral respiratory infection.  Rx Mucinex to help with mucous and Albuterol for wheezing.  Pt instructed to call office for appointment if he is not feeling better in one week.

## 2011-09-04 ENCOUNTER — Encounter (HOSPITAL_COMMUNITY): Payer: Self-pay | Admitting: *Deleted

## 2011-09-04 ENCOUNTER — Emergency Department (HOSPITAL_COMMUNITY)
Admission: EM | Admit: 2011-09-04 | Discharge: 2011-09-05 | Disposition: A | Discharge status: Home / Self Care | Payer: Self-pay | Attending: Emergency Medicine | Admitting: Emergency Medicine

## 2011-09-04 DIAGNOSIS — IMO0002 Reserved for concepts with insufficient information to code with codable children: Secondary | ICD-10-CM | POA: Insufficient documentation

## 2011-09-04 DIAGNOSIS — L02411 Cutaneous abscess of right axilla: Secondary | ICD-10-CM

## 2011-09-04 NOTE — ED Notes (Signed)
Pt noticed that his right arm was hurting Monday.  Pt noticed lumps under his right arm on Wednesday that were sore.  Friday, these sores ruptured and started to ooze blood and puss.  Pt denies fever or general malaise.  Pt denies hx of same.

## 2011-09-05 MED ORDER — SULFAMETHOXAZOLE-TRIMETHOPRIM 800-160 MG PO TABS: 1.0000 | ORAL_TABLET | Freq: Two times a day (BID) | ORAL | Status: AC

## 2011-09-05 NOTE — Discharge Instructions (Signed)
You were seen and evaluated today for an abscess infection in your armpit. This was opened to help with drainage. Use a warm compress 3-4 times a day for 20-30 minutes to help increase blood flow and help with drainage. If you develop any worsening swelling, pain, fever, chills return to the emergency room.   Abscess An abscess (boil or furuncle) is an infected area that contains a collection of pus.  SYMPTOMS Signs and symptoms of an abscess include pain, tenderness, redness, or hardness. You may feel a moveable soft area under your skin. An abscess can occur anywhere in the body.  TREATMENT  A surgical cut (incision) may be made over your abscess to drain the pus. Gauze may be packed into the space or a drain may be looped through the abscess cavity (pocket). This provides a drain that will allow the cavity to heal from the inside outwards. The abscess may be painful for a few days, but should feel much better if it was drained.  Your abscess, if seen early, may not have localized and may not have been drained. If not, another appointment may be required if it does not get better on its own or with medications. HOME CARE INSTRUCTIONS   Only take over-the-counter or prescription medicines for pain, discomfort, or fever as directed by your caregiver.   Take your antibiotics as directed if they were prescribed. Finish them even if you start to feel better.   Keep the skin and clothes clean around your abscess.   If the abscess was drained, you will need to use gauze dressing to collect any draining pus. Dressings will typically need to be changed 3 or more times a day.   The infection may spread by skin contact with others. Avoid skin contact as much as possible.   Practice good hygiene. This includes regular hand washing, cover any draining skin lesions, and do not share personal care items.   If you participate in sports, do not share athletic equipment, towels, whirlpools, or personal care  items. Shower after every practice or tournament.   If a draining area cannot be adequately covered:   Do not participate in sports.   Children should not participate in day care until the wound has healed or drainage stops.   If your caregiver has given you a follow-up appointment, it is very important to keep that appointment. Not keeping the appointment could result in a much worse infection, chronic or permanent injury, pain, and disability. If there is any problem keeping the appointment, you must call back to this facility for assistance.  SEEK MEDICAL CARE IF:   You develop increased pain, swelling, redness, drainage, or bleeding in the wound site.   You develop signs of generalized infection including muscle aches, chills, fever, or a general ill feeling.   You have an oral temperature above 102 F (38.9 C).  MAKE SURE YOU:   Understand these instructions.   Will watch your condition.   Will get help right away if you are not doing well or get worse.  Document Released: 03/16/2005 Document Revised: 05/26/2011 Document Reviewed: 01/08/2008 [facility] Patient Information 2012 [place], [place].   RESOURCE GUIDE  Dental Problems  Patients with Medicaid: [facility] [phone] [address]  [address] Phone:  [phone]                                                  Phone:  [phone]  If unable to pay or uninsured, contact:  [facility] or [facility]. to become qualified for the adult dental clinic.  Chronic Pain Problems Contact [facility]  [phone] Patients need to be referred by their primary care doctor.  Insufficient Money for Medicine Contact [employer] Way:  call "211" or [facility] [phone].  No Primary Care Doctor Call [facility]  [phone] Other agencies that provide inexpensive medical care    [facility]  [phone]    [facility] Internal Medicine  [phone]    [facility]  [phone]    [facility]  [phone]    Planned Parenthood  5340238789    [facility] Child Clinic  [phone]  Psychological Services [facility] Behavioral Health  [phone] [facility] Services  [phone] [facility]   [phone] (emergency services [phone])  Substance Abuse Resources [facility] Drug Services  [phone] [facility] [phone] [facility] [place] [phone] Floydene Flock [phone] Residential & Outpatient Substance Abuse Program  [phone]  Abuse/Neglect [facility] Child Abuse Hotline 330-589-5720 [facility] Child Abuse Hotline (531)557-8286 (After Hours)  Emergency Shelter [facility] [phone]  Maternity Homes Room at the [place] [facility] [place] 513-411-0913 [facility] Services (226)[phone]  MRSA Hotline #:   (915) 390-5036    [facility] Resources  [facility] [place]     [employer]                          [facility] Dept. [address] [place]                       [address]      [address]  Blondell Reveal Phone:  [phone]                                   Phone:  [phone]                 Phone:  [phone]  [facility] Mental Health Phone:  [phone]  [facility] Child Abuse Hotline (928)182-8674 661-187-2172 (After Hours)

## 2011-09-05 NOTE — ED Provider Notes (Signed)
History     CSN: [id number]  Arrival date & time 09/04/11  2235   First MD Initiated Contact with Patient 09/05/11 0145      Chief Complaint  Patient presents with  . Wound Check     HPI  History provided by the patient. Patient is a 41 year old male with no significant past medical history presents with complaints of skin infection to right axilla training and bleeding. Patient reports having some swelling to the right axilla over the past few weeks. 3 days ago patient noticed pus and blood draining from his armpit. Area has been tender to touch. Drainage is worsened when patient lifts his arm up. Drainage and bleeding stops when he closes his arm. Symptoms are described as mild. Patient denies any other aggravating or alleviating factors. Patient denies similar symptoms previously.    History reviewed. No pertinent past medical history.  Past Surgical History  Procedure Date  . Cholecystectomy     No family history on file.  History  Substance Use Topics  . Smoking status: Former Games developer  . Smokeless tobacco: Not on file  . Alcohol Use: No      Review of Systems  Constitutional: Negative for fever and chills.  All other systems reviewed and are negative.    Allergies  Review of patient's allergies indicates no known allergies.  Home Medications   Current Outpatient Rx  Name Route Sig Dispense Refill  . DIPHENHYDRAMINE HCL 25 MG PO TABS Oral Take 25 mg by mouth every 6 (six) hours as needed. For allergies    . IBUPROFEN 200 MG PO TABS Oral Take 400 mg by mouth every 6 (six) hours as needed.    Marland Kitchen BACITRACIN-NEOMYCIN-POLYMYXIN OINTMENT TUBE Topical Apply 1 application topically as needed. For place under arm      BP 143/86  Pulse 95  Temp(Src) 98.5 F (36.9 C) (Oral)  Resp 20  SpO2 99%  Physical Exam  Nursing note and vitals reviewed. Constitutional: He is oriented to person, place, and time. He appears well-developed and well-nourished. No distress.    HENT:  Head: Normocephalic and atraumatic.  Cardiovascular: Normal rate and regular rhythm.   Pulmonary/Chest: Effort normal and breath sounds normal. No respiratory distress. He has no wheezes. He has no rales.  Musculoskeletal: Normal range of motion.  Neurological: He is alert and oriented to person, place, and time.  Skin: Skin is warm.       Open draining abscess from right axilla with blood and pus. Area tender to palpation. Dry scaly skin throughout body  Psychiatric: He has a normal mood and affect. His behavior is normal.    ED Course  Procedures   INCISION AND DRAINAGE Performed by: Angus Seller Consent: Verbal consent obtained. Risks and benefits: risks, benefits and alternatives were discussed Type: abscess  Body area: Right axilla  Anesthesia: local infiltration  Local anesthetic: lidocaine 1 % without epinephrine  Anesthetic total: 4 ml  Complexity: complex Blunt dissection to break up loculations  Drainage: purulent  Drainage amount: Large   Packing material: None   Patient tolerance: Patient tolerated the procedure well with no immediate complications.    1. Abscess of right axilla       MDM  Patient seen and evaluated. Patient no acute distress.        Angus Seller, PA 09/05/11 0300

## 2011-09-05 NOTE — ED Notes (Signed)
Pt is also C/O generalized weakness and malaise.

## 2011-09-09 NOTE — ED Provider Notes (Signed)
Medical screening examination/treatment/procedure(s) were performed by non-physician practitioner and as supervising physician I was immediately available for consultation/collaboration.  Emani Taussig, MD 09/09/11 1926 

## 2012-12-18 DIAGNOSIS — L0390 Cellulitis, unspecified: Secondary | ICD-10-CM

## 2012-12-18 HISTORY — DX: Cellulitis, unspecified: L03.90

## 2013-03-18 ENCOUNTER — Encounter (HOSPITAL_COMMUNITY): Payer: Self-pay | Admitting: *Deleted

## 2013-03-18 ENCOUNTER — Emergency Department (HOSPITAL_COMMUNITY)
Admission: EM | Admit: 2013-03-18 | Discharge: 2013-03-18 | Disposition: A | Discharge status: Home / Self Care | Payer: Self-pay | Attending: Emergency Medicine | Admitting: Emergency Medicine

## 2013-03-18 DIAGNOSIS — L02419 Cutaneous abscess of limb, unspecified: Secondary | ICD-10-CM | POA: Insufficient documentation

## 2013-03-18 DIAGNOSIS — L03116 Cellulitis of left lower limb: Secondary | ICD-10-CM

## 2013-03-18 DIAGNOSIS — Q809 Congenital ichthyosis, unspecified: Secondary | ICD-10-CM | POA: Insufficient documentation

## 2013-03-18 DIAGNOSIS — Q800 Ichthyosis vulgaris: Secondary | ICD-10-CM

## 2013-03-18 DIAGNOSIS — I10 Essential (primary) hypertension: Secondary | ICD-10-CM | POA: Insufficient documentation

## 2013-03-18 DIAGNOSIS — F17200 Nicotine dependence, unspecified, uncomplicated: Secondary | ICD-10-CM | POA: Insufficient documentation

## 2013-03-18 LAB — CBC WITH DIFFERENTIAL/PLATELET
Eosinophils Absolute: 0.2 10*3/uL (ref 0.0–0.7)
Hemoglobin: 16.8 g/dL (ref 13.0–17.0)
Lymphocytes Relative: 37 % (ref 12–46)
Lymphs Abs: 3.8 10*3/uL (ref 0.7–4.0)
MCH: 33.3 pg (ref 26.0–34.0)
MCV: 91.1 fL (ref 78.0–100.0)
Monocytes Relative: 8 % (ref 3–12)
Neutrophils Relative %: 53 % (ref 43–77)
Platelets: 232 10*3/uL (ref 150–400)
RBC: 5.05 MIL/uL (ref 4.22–5.81)
WBC: 10.4 10*3/uL (ref 4.0–10.5)

## 2013-03-18 LAB — BASIC METABOLIC PANEL
BUN: 6 mg/dL (ref 6–23)
CO2: 27 mEq/L (ref 19–32)
Glucose, Bld: 107 mg/dL — ABNORMAL HIGH (ref 70–99)
Potassium: 3.6 mEq/L (ref 3.5–5.1)
Sodium: 138 mEq/L (ref 135–145)

## 2013-03-18 MED ORDER — HYDROCODONE-ACETAMINOPHEN 5-325 MG PO TABS: 1.0000 | ORAL_TABLET | ORAL | Status: DC | PRN

## 2013-03-18 MED ORDER — CEPHALEXIN 500 MG PO CAPS: 500.0000 mg | ORAL_CAPSULE | Freq: Four times a day (QID) | ORAL | Status: DC

## 2013-03-18 NOTE — ED Notes (Signed)
Pt states that his left ankle has a sore that has been there for a couple months; pt states that it has progressed to ankle pain and swelling; difficult to bare weight due to pain; pt reports some clear drainage at times; pt c/o swelling to area

## 2013-03-18 NOTE — ED Provider Notes (Signed)
Medical screening examination/treatment/procedure(s) were performed by non-physician practitioner and as supervising physician I was immediately available for consultation/collaboration.  Olivia Mackie, MD 03/18/13 [phone]

## 2013-03-18 NOTE — ED Provider Notes (Signed)
6:42 AM  Sign out received from Ivonne Andrew, PA-C at change of shift.  Pt with cellulitis of left ankle, gradually progressing over 1 month.  Plan is for CBC, BMP, d/c home with keflex until labs indicate need for broader coverage (such as undiagnosed diabetes).  I spoke with patient who is resting comfortably presently.  Has PCP follow up 03/28/13. He verbalizes understanding and agrees with current plan. Return precautions given.   Results for orders placed during the hospital encounter of 03/18/13  CBC WITH DIFFERENTIAL      Result Value Range   WBC 10.4  4.0 - 10.5 K/uL   RBC 5.05  4.22 - 5.81 MIL/uL   Hemoglobin 16.8  13.0 - 17.0 g/dL   HCT 08.6  57.8 - 46.9 %   MCV 91.1  78.0 - 100.0 fL   MCH 33.3  26.0 - 34.0 pg   MCHC 36.5 (*) 30.0 - 36.0 g/dL   RDW 62.9  52.8 - 41.3 %   Platelets 232  150 - 400 K/uL   Neutrophils Relative % 53  43 - 77 %   Neutro Abs 5.5  1.7 - 7.7 K/uL   Lymphocytes Relative 37  12 - 46 %   Lymphs Abs 3.8  0.7 - 4.0 K/uL   Monocytes Relative 8  3 - 12 %   Monocytes Absolute 0.8  0.1 - 1.0 K/uL   Eosinophils Relative 2  0 - 5 %   Eosinophils Absolute 0.2  0.0 - 0.7 K/uL   Basophils Relative 0  0 - 1 %   Basophils Absolute 0.0  0.0 - 0.1 K/uL  BASIC METABOLIC PANEL      Result Value Range   Sodium 138  135 - 145 mEq/L   Potassium 3.6  3.5 - 5.1 mEq/L   Chloride 100  96 - 112 mEq/L   CO2 27  19 - 32 mEq/L   Glucose, Bld 107 (*) 70 - 99 mg/dL   BUN 6  6 - 23 mg/dL   Creatinine, Ser 2.44  0.50 - 1.35 mg/dL   Calcium 9.1  8.4 - 01.0 mg/dL   GFR calc non Af Amer >90  >90 mL/min   GFR calc Af Amer >90  >90 mL/min   No results found.    Baker, PA-C 03/18/13 [phone]

## 2013-03-18 NOTE — ED Provider Notes (Signed)
CSN: [id number]     Arrival date & time 03/18/13  0506 History   First MD Initiated Contact with Patient 03/18/13 [phone]     Chief Complaint  Patient presents with  . Ankle Pain   HPI  History provided by the patient. The patient is a 42 year old male with history of cholecystectomy who presents with complaints of to the worsening redness, swelling and warmth of his left lower leg and ankle. Patient reports having a chronic irritation and the medial aspect of his left ankle and leg area past several months. Over the past several days has been increasing swelling, mild redness and increased warmth of the skin this area. He has also had clear liquid draining from the area. Denies bleeding. He has been using Neosporin ointment over the area without any significant improvement. He has also been taking ibuprofen for the pain and throbbing to the area. Patient currently is without a primary care provider is no other known medical history. He does report that he may appointment on practice clinic a week from Thursday. Denies any other aggravating or alleviating factors. No other associated symptoms. No fever, chills or sweats. No chest pain or shortness of breath.     History reviewed. No pertinent past medical history. Past Surgical History  Procedure Laterality Date  . Cholecystectomy     No family history on file. History  Substance Use Topics  . Smoking status: Current Every Day Smoker -- 0.15 packs/day  . Smokeless tobacco: Not on file  . Alcohol Use: No    Review of Systems  Constitutional: Negative for fever, chills and diaphoresis.  Respiratory: Negative for shortness of breath.   Cardiovascular: Negative for chest pain.  All other systems reviewed and are negative.    Allergies  Review of patient's allergies indicates no known allergies.  Home Medications   Current Outpatient Rx  Name  Route  Sig  Dispense  Refill  . diphenhydrAMINE (BENADRYL) 25 MG tablet   Oral   Take 25  mg by mouth every 6 (six) hours as needed. For allergies         . ibuprofen (ADVIL,MOTRIN) 200 MG tablet   Oral   Take 400 mg by mouth every 6 (six) hours as needed.         . neomycin-bacitracin-polymyxin (NEOSPORIN) OINT   Topical   Apply 1 application topically as needed. For place under arm          BP 153/99  Pulse 99  Temp(Src) 98.8 F (37.1 C) (Oral)  Resp 16  SpO2 94% Physical Exam  Nursing note and vitals reviewed. Constitutional: He is oriented to person, place, and time. He appears well-developed and well-nourished. No distress.  HENT:  Head: Normocephalic.  Cardiovascular: Normal rate and regular rhythm.   Pulmonary/Chest: Effort normal and breath sounds normal. No respiratory distress. He has no wheezes. He has no rales.  Abdominal: Soft.  Musculoskeletal: Normal range of motion.  Swelling over the left medial malleolus area with mild edema and increased warmth of the skin. There is mild weeping without purulence. No bleeding. Mild erythema streaking up the medial aspect of the lower leg into the calf.  Neurological: He is alert and oriented to person, place, and time.  Skin: Skin is warm.  Significant dry and scaly skin of the lower extremities with ichthyosis vulgaris appearance.  Toenails long and unkempt.   Psychiatric: He has a normal mood and affect. His behavior is normal.    ED Course  Procedures   Results for orders placed during the hospital encounter of 03/18/13  CBC WITH DIFFERENTIAL      Result Value Range   WBC 10.4  4.0 - 10.5 K/uL   RBC 5.05  4.22 - 5.81 MIL/uL   Hemoglobin 16.8  13.0 - 17.0 g/dL   HCT 40.9  81.1 - 91.4 %   MCV 91.1  78.0 - 100.0 fL   MCH 33.3  26.0 - 34.0 pg   MCHC 36.5 (*) 30.0 - 36.0 g/dL   RDW 78.2  95.6 - 21.3 %   Platelets 232  150 - 400 K/uL   Neutrophils Relative % 53  43 - 77 %   Neutro Abs 5.5  1.7 - 7.7 K/uL   Lymphocytes Relative 37  12 - 46 %   Lymphs Abs 3.8  0.7 - 4.0 K/uL   Monocytes Relative 8   3 - 12 %   Monocytes Absolute 0.8  0.1 - 1.0 K/uL   Eosinophils Relative 2  0 - 5 %   Eosinophils Absolute 0.2  0.0 - 0.7 K/uL   Basophils Relative 0  0 - 1 %   Basophils Absolute 0.0  0.0 - 0.1 K/uL     MDM   1. Cellulitis of left lower leg   2. Ichthyosis vulgaris   3. Hypertension      5:20 a.m. patient seen and evaluated. Patient well appearing no distress. Plan check basic lab test. Will likely provide prescriptions for antibiotics with outpatient followup.  Pt discussed in sign out with Trixie Dredge PA-C.  She will follow up on labs.  Angus Seller, PA-C 03/18/13 [phone]

## 2013-03-18 NOTE — ED Notes (Signed)
Patient was educated not to drive, operate heavy machinery, or drink alcohol while taking narcotic medication.  

## 2013-03-18 NOTE — ED Provider Notes (Signed)
Medical screening examination/treatment/procedure(s) were performed by non-physician practitioner and as supervising physician I was immediately available for consultation/collaboration.  Olivia Mackie, MD 03/18/13 [phone]

## 2013-03-28 ENCOUNTER — Ambulatory Visit: Payer: Self-pay

## 2013-03-28 ENCOUNTER — Encounter: Payer: Self-pay | Admitting: Internal Medicine

## 2013-03-28 ENCOUNTER — Ambulatory Visit: Payer: Self-pay | Attending: Internal Medicine | Admitting: Internal Medicine

## 2013-03-28 VITALS — BP 137/95 | HR 92 | Temp 99.3°F | Resp 15 | Wt 340.0 lb

## 2013-03-28 DIAGNOSIS — I872 Venous insufficiency (chronic) (peripheral): Secondary | ICD-10-CM

## 2013-03-28 DIAGNOSIS — Q800 Ichthyosis vulgaris: Secondary | ICD-10-CM | POA: Insufficient documentation

## 2013-03-28 DIAGNOSIS — Q809 Congenital ichthyosis, unspecified: Secondary | ICD-10-CM

## 2013-03-28 DIAGNOSIS — L03116 Cellulitis of left lower limb: Secondary | ICD-10-CM

## 2013-03-28 DIAGNOSIS — I8310 Varicose veins of unspecified lower extremity with inflammation: Secondary | ICD-10-CM

## 2013-03-28 DIAGNOSIS — L02419 Cutaneous abscess of limb, unspecified: Secondary | ICD-10-CM | POA: Insufficient documentation

## 2013-03-28 MED ORDER — CEPHALEXIN 500 MG PO CAPS: 500.0000 mg | ORAL_CAPSULE | Freq: Four times a day (QID) | ORAL | Status: DC

## 2013-03-28 MED ORDER — TRAMADOL HCL 50 MG PO TABS: 50.0000 mg | ORAL_TABLET | Freq: Three times a day (TID) | ORAL | Status: DC | PRN

## 2013-03-28 MED ORDER — MUPIROCIN CALCIUM 2 % EX CREA: TOPICAL_CREAM | Freq: Three times a day (TID) | CUTANEOUS | Status: DC

## 2013-03-28 MED ORDER — FUROSEMIDE 20 MG PO TABS: 20.0000 mg | ORAL_TABLET | Freq: Every day | ORAL | Status: DC

## 2013-03-28 NOTE — Progress Notes (Signed)
Patient states has had a abrasion to his left inner ankle for past  6 months has gotten worse the past two months-red swollen with flaky Skin around the area

## 2013-03-28 NOTE — Patient Instructions (Signed)
Obesity Obesity is defined as having too much total body fat and a body mass index (BMI) of 30 or more. BMI is an estimate of body fat and is calculated from your height and weight. Obesity happens when you consume more calories than you can burn by exercising or performing daily physical tasks. Prolonged obesity can cause major illnesses or emergencies, such as:   A stroke.  Heart disease.  Diabetes.  Cancer.  Arthritis.  High blood pressure (hypertension).  High cholesterol.  Sleep apnea.  Erectile dysfunction.  Infertility problems. CAUSES   Regularly eating unhealthy foods.  Physical inactivity.  Certain disorders, such as an underactive thyroid (hypothyroidism), Cushing's syndrome, and polycystic ovarian syndrome.  Certain medicines, such as steroids, some depression medicines, and antipsychotics.  Genetics.  Lack of sleep. DIAGNOSIS  A caregiver can diagnose obesity after calculating your BMI. Obesity will be diagnosed if your BMI is 30 or higher.  There are other methods of measuring obesity levels. Some other methods include measuring your skin fold thickness, your waist circumference, and comparing your hip circumference to your waist circumference. TREATMENT  A healthy treatment program includes some or all of the following:  Long-term dietary changes.  Exercise and physical activity.  Behavioral and lifestyle changes.  Medicine only under the supervision of your caregiver. Medicines may help, but only if they are used with diet and exercise programs. An unhealthy treatment program includes:  Fasting.  Fad diets.  Supplements and drugs. These choices do not succeed in long-term weight control.  HOME CARE INSTRUCTIONS   Exercise and perform physical activity as directed by your caregiver. To increase physical activity, try the following:  Use stairs instead of elevators.  Park farther away from store entrances.  Garden, bike, or walk instead of  watching television or using the computer.  Eat healthy, low-calorie foods and drinks on a regular basis. Eat more fruits and vegetables. Use low-calorie cookbooks or take healthy cooking classes.  Limit fast food, sweets, and processed snack foods.  Eat smaller portions.  Keep a daily journal of everything you eat. There are many free websites to help you with this. It may be helpful to measure your foods so you can determine if you are eating the correct portion sizes.  Avoid drinking alcohol. Drink more water and drinks without calories.  Take vitamins and supplements only as recommended by your caregiver.  Weight-loss support groups, Registered Dieticians, counselors, and stress reduction education can also be very helpful. SEEK IMMEDIATE MEDICAL CARE IF:  You have chest pain or tightness.  You have trouble breathing or feel short of breath.  You have weakness or leg numbness.  You feel confused or have trouble talking.  You have sudden changes in your vision. MAKE SURE YOU:  Understand these instructions.  Will watch your condition.  Will get help right away if you are not doing well or get worse. Document Released: 07/14/2004 Document Revised: 12/06/2011 Document Reviewed: 07/13/2011 Exit[employer] Patient Information 2014 [employer].  

## 2013-03-28 NOTE — Progress Notes (Signed)
Patient ID: Erik Howe, male   DOB: 10-06-1970, 42 y.o.   MRN: [id number] Patient Demographics  Erik Howe, is a 42 y.o. male  WUJ:[id number]  [id number]  DOB - May 09, 1971  CC:  Chief Complaint  Patient presents with  . Wound Infection       HPI: Erik Howe is a 42 y.o. male here today to establish medical care. He has no significant past medical history except for bilateral leg swelling, recently the skin of his left leg broke down around the ankle joint and became red, he was seen in the ER, diagnosed with cellulitis and treated with Keflex for 7 days, last dose was a few days ago but there is no significant improvement. Left ankle is still red and painful, with weeping lesion. He also applies Neosporin to the skin without significant improvement. He does not have hypertension or diabetes.  Patient has No headache, No chest pain, No abdominal pain - No Nausea, No new weakness tingling or numbness, No Cough - SOB.  No Known Allergies History reviewed. No pertinent past medical history. Current Outpatient Prescriptions on File Prior to Visit  Medication Sig Dispense Refill  . diphenhydrAMINE (BENADRYL) 25 MG tablet Take 25 mg by mouth every 6 (six) hours as needed. For allergies      . HYDROcodone-acetaminophen (NORCO/VICODIN) 5-325 MG per tablet Take 1 tablet by mouth every 4 (four) hours as needed for pain.  15 tablet  0  . ibuprofen (ADVIL,MOTRIN) 200 MG tablet Take 400 mg by mouth every 6 (six) hours as needed.      . neomycin-bacitracin-polymyxin (NEOSPORIN) OINT Apply 1 application topically as needed. For place under arm      . [DISCONTINUED] albuterol (PROVENTIL HFA;VENTOLIN HFA) 108 (90 BASE) MCG/ACT inhaler Inhale 2 puffs into the lungs every 6 (six) hours as needed for wheezing.  1 Inhaler  2  . [DISCONTINUED] FLUoxetine (PROZAC) 20 MG capsule Take 1 capsule (20 mg total) by mouth daily.  60 capsule  2   No current facility-administered medications on file prior to visit.    History reviewed. No pertinent family history. History   Social History  . Marital Status: Single    Spouse Name: N/A    Number of Children: N/A  . Years of Education: N/A   Occupational History  . Not on file.   Social History Main Topics  . Smoking status: Current Every Day Smoker -- 0.15 packs/day  . Smokeless tobacco: Not on file  . Alcohol Use: No  . Drug Use: No  . Sexual Activity: Not on file   Other Topics Concern  . Not on file   Social History Narrative  . No narrative on file    Review of Systems: Constitutional: Negative for fever, chills, diaphoresis, activity change, appetite change and fatigue. HENT: Negative for ear pain, nosebleeds, congestion, facial swelling, rhinorrhea, neck pain, neck stiffness and ear discharge.  Eyes: Negative for pain, discharge, redness, itching and visual disturbance. Respiratory: Negative for cough, choking, chest tightness, shortness of breath, wheezing and stridor.  Cardiovascular: Negative for chest pain, palpitations and leg swelling. Gastrointestinal: Negative for abdominal distention. Genitourinary: Negative for dysuria, urgency, frequency, hematuria, flank pain, decreased urine volume, difficulty urinating and dyspareunia.  Neurological: Negative for dizziness, tremors, seizures, syncope, facial asymmetry, speech difficulty, weakness, light-headedness, numbness and headaches.  Hematological: Negative for adenopathy. Does not bruise/bleed easily. Psychiatric/Behavioral: Negative for hallucinations, behavioral problems, confusion, dysphoric mood, decreased concentration and agitation.    Objective:   Filed Vitals:  03/28/13 1415  BP: 137/95  Pulse: 92  Temp: 99.3 F (37.4 C)  Resp: 15    Physical Exam: Constitutional: Patient appears well-developed and well-nourished. No distress. HENT: Normocephalic, atraumatic, External right and left ear normal. Oropharynx is clear and moist.  Eyes: Conjunctivae and EOM are  normal. PERRLA, no scleral icterus. Neck: Normal ROM. Neck supple. No JVD. No tracheal deviation. No thyromegaly. CVS: RRR, S1/S2 +, no murmurs, no gallops, no carotid bruit.  Pulmonary: Effort and breath sounds normal, no stridor, rhonchi, wheezes, rales.  Abdominal: Soft. BS +, no distension, tenderness, rebound or guarding.  Musculoskeletal: Lateral lower leg edema, left leg is worse with area of maceration, erythema, and weeping lesion around the ankle joint, not foul-smelling. Evidence of poor hygiene, with overgrown nails and calluses.  Bilateral pitting pedal edema , generalized scaly and flaky skin  Lymphadenopathy: No lymphadenopathy noted, cervical, inguinal or axillary Neuro: Alert. Normal reflexes, muscle tone coordination. No cranial nerve deficit. Skin: Skin is warm and dry. Flaky and scaly skin generalized  Psychiatric: Normal mood and affect. Behavior, judgment, thought content normal.  Lab Results  Component Value Date   WBC 10.4 03/18/2013   HGB 16.8 03/18/2013   HCT 46.0 03/18/2013   MCV 91.1 03/18/2013   PLT 232 03/18/2013   Lab Results  Component Value Date   CREATININE 0.75 03/18/2013   BUN 6 03/18/2013   NA 138 03/18/2013   K 3.6 03/18/2013   CL 100 03/18/2013   CO2 27 03/18/2013    No results found for this basename: HGBA1C   Lipid Panel  No results found for this basename: chol, trig, hdl, cholhdl, vldl, ldlcalc       Assessment and plan:   Patient Active Problem List   Diagnosis Date Noted  . Cellulitis of leg, left 03/28/2013  . 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 Stasis dermatitis of both legs 03/28/2013  . Severe obesity (BMI >= 40) 03/28/2013  . Nummular eczema 12/05/2010  . Depression 12/05/2010  . TOBACCO USER 03/28/2009  . WHEEZING 06/04/2008  . INSOMNIA-SLEEP DISORDER-UNSPEC 11/28/2007  . ELEVATED BP READING WITHOUT DX HYPERTENSION 05/09/2007  . DIARRHEA, CHRONIC 11/16/2006  . OBESITY 08/28/2006  . ANXIETY DISORDER, GENERALIZED 08/28/2006  .  GERD 08/28/2006  . BACK PAIN 08/28/2006  . ICHTHYOSIS 08/28/2006    Plan: 1. Cellulitis of left leg: Keflex 500 mg tablet by mouth 4 times a day for another 7 days to complete a total of 14 days antibiotics Tramadol 50 mg tablet by mouth 3 times a day when necessary pain  2. Ichthyosis vulgaris: Patient advised on personal hygiene, use of Eucerin cream, constant moisturizer  3. Trial of Lasix 20 mg tablet by mouth daily for bilateral lower leg stasis dermatitis and edema  Patient has been counseled extensively about nutrition and exercise Patient also counseled extensively about smoking cessation He has been given resources on weight control   Follow up in 4 weeks or when necessary   The patient was given clear instructions to go to ER or return to medical center if symptoms don't improve, worsen or new problems develop. The patient verbalized understanding. The patient was told to call to get lab results if they haven't heard anything in the next week.   Jeanann Lewandowsky, MD, MHA, FACP, FAAP [facility] [place], [place] [phone]   03/28/2013, 2:36 PM

## 2013-04-22 ENCOUNTER — Ambulatory Visit: Payer: No Typology Code available for payment source | Attending: Internal Medicine

## 2013-04-25 ENCOUNTER — Encounter (INDEPENDENT_AMBULATORY_CARE_PROVIDER_SITE_OTHER): Payer: Self-pay

## 2013-04-25 ENCOUNTER — Ambulatory Visit: Payer: No Typology Code available for payment source | Attending: Internal Medicine | Admitting: Internal Medicine

## 2013-04-25 ENCOUNTER — Encounter: Payer: Self-pay | Admitting: Internal Medicine

## 2013-04-25 VITALS — BP 134/86 | HR 88 | Temp 98.2°F | Resp 16 | Wt 339.0 lb

## 2013-04-25 DIAGNOSIS — Q800 Ichthyosis vulgaris: Secondary | ICD-10-CM

## 2013-04-25 DIAGNOSIS — L02419 Cutaneous abscess of limb, unspecified: Secondary | ICD-10-CM

## 2013-04-25 DIAGNOSIS — L03116 Cellulitis of left lower limb: Secondary | ICD-10-CM

## 2013-04-25 DIAGNOSIS — I872 Venous insufficiency (chronic) (peripheral): Secondary | ICD-10-CM

## 2013-04-25 DIAGNOSIS — I8310 Varicose veins of unspecified lower extremity with inflammation: Secondary | ICD-10-CM

## 2013-04-25 DIAGNOSIS — Q809 Congenital ichthyosis, unspecified: Secondary | ICD-10-CM

## 2013-04-25 MED ORDER — TRAMADOL HCL 50 MG PO TABS: 50.0000 mg | ORAL_TABLET | Freq: Three times a day (TID) | ORAL | Status: DC | PRN

## 2013-04-25 MED ORDER — BACITRACIN-NEOMYCIN-POLYMYXIN OINTMENT TUBE: 1.0000 "application " | TOPICAL_OINTMENT | CUTANEOUS | Status: DC | PRN

## 2013-04-25 MED ORDER — FLUOXETINE HCL 20 MG PO TABS: 20.0000 mg | ORAL_TABLET | Freq: Every day | ORAL | Status: DC

## 2013-04-25 MED ORDER — ACETAMINOPHEN-CODEINE #3 300-30 MG PO TABS: 1.0000 | ORAL_TABLET | ORAL | Status: DC | PRN

## 2013-04-25 NOTE — Progress Notes (Signed)
Patient here for wound on left foot Complains not healing Has some anxiety as well

## 2013-04-25 NOTE — Patient Instructions (Signed)
Stasis Dermatitis °Stasis dermatitis occurs when veins lose the ability to pump blood back to the heart (poor venous circulation). It causes a reddish-purple to brownish scaly, itchy rash on the legs. The rash comes from pooling of blood (stasis). °CAUSES  °This occurs because the veins do not work very well anymore or because pressure may be increased in the veins due to other conditions. With blood pooling, the increased pressure in the tiny blood vessels (capillaries) causes fluid to leak out of the capillaries into the tissue. The extra fluid makes it harder for the blood to feed the cells and get rid of waste products. °SYMPTOMS  °Stasis dermatitis appears as red, scaly, itchy patches on the legs. A yellowish or light brown discoloration is also present. Due to scratching or other injury, these patches can become an ulcer. This ulcer may remain for long periods of time. The ulcer can also become infected. Swelling of the legs is often present with stasis dermatitis. If the leg is swollen, this increases the risk of infection and further damage to the skin. Sometimes, intense itching, tingling, and burning occurs before signs of stasis dermatitis appear. You may find yourself scratching the insides of your ankles or rubbing your ankles together before the rash appears. After healing, there are often brown spots on the affected skin. °DIAGNOSIS  °Your caregiver makes this diagnosis based on an exam. Other tests may be done to better understand the cause. °TREATMENT  °If underlying conditions are present, they must be treated. Some of these conditions are heart failure, thyroid problems, poor nutrition, and varicose veins. °· Cortisone creams and ointments applied to the skin (topically) may be needed, as well as medicine to reduce swelling in the legs (diuretics). °· Compression stockings or an elastic wrap may also be needed to reduce swelling. °· If there is an infection, antibiotic medicines may also be  used. °HOME CARE INSTRUCTIONS  °· Try to rest and raise (elevate) the affected leg above the level of the heart, if possible. °· Burow's solution wet packs applied for 30 minutes, 3 times daily, will help the weepy rash. Stop using the packs before your skin gets too dry. You can also use a mixture of 3 parts white vinegar to 1 quart water. °· Grease your legs daily with ointments, such as petroleum jelly, to fight dryness. °· Avoid scratching or injuring the area. °SEEK IMMEDIATE MEDICAL CARE IF:  °· Your rash gets worse. °· An ulcer forms. °· You have an oral temperature above 102° F (38.9° C), not controlled by medicine. °· You have any other severe symptoms. °Document Released: 09/15/2005 Document Revised: 08/29/2011 Document Reviewed: 10/26/2009 °[employer]® Patient Information ©2014 [employer]. ° °

## 2013-04-25 NOTE — Progress Notes (Signed)
Patient ID: JD MCCASTER, male   DOB: [birth year], 42 y.o.   MRN: [id number] Patient Demographics  Erik Howe, is a 42 y.o. male  [id number]  [id number]  DOB - 03-Sep-1970  Chief Complaint  Patient presents with  . Wound Check        Subjective:   Erik Howe is a 42 y.o. male here today for a follow up visit. Patient came to show how his progress and with a wound on his left foot caused by stasis dermatitis and dry skin. Patient is to morbidly obese, he claims he needs something stronger for pain, the tramadol does not do much. No new complaints, no fever, no discharge from wound. Patient has No headache, No chest pain, No abdominal pain - No Nausea, No new weakness tingling or numbness, No Cough - SOB.  ALLERGIES: No Known Allergies  PAST MEDICAL HISTORY: History reviewed. No pertinent past medical history.  MEDICATIONS AT HOME: Prior to Admission medications   Medication Sig Start Date End Date Taking? Authorizing Provider  acetaminophen-codeine (TYLENOL #3) 300-30 MG per tablet Take 1 tablet by mouth every 4 (four) hours as needed for moderate pain. 04/25/13   Jeanann Lewandowsky, MD  cephALEXin (KEFLEX) 500 MG capsule Take 1 capsule (500 mg total) by mouth 4 (four) times daily. 03/28/13   Jeanann Lewandowsky, MD  diphenhydrAMINE (BENADRYL) 25 MG tablet Take 25 mg by mouth every 6 (six) hours as needed. For allergies    Historical Provider, MD  furosemide (LASIX) 20 MG tablet Take 1 tablet (20 mg total) by mouth daily. 03/28/13   Jeanann Lewandowsky, MD  HYDROcodone-acetaminophen (NORCO/VICODIN) 5-325 MG per tablet Take 1 tablet by mouth every 4 (four) hours as needed for pain. 03/18/13   Trixie Dredge, PA-C  ibuprofen (ADVIL,MOTRIN) 200 MG tablet Take 400 mg by mouth every 6 (six) hours as needed.    Historical Provider, MD  mupirocin cream (BACTROBAN) 2 % Apply topically 3 (three) times daily. 03/28/13   Jeanann Lewandowsky, MD  neomycin-bacitracin-polymyxin (NEOSPORIN) OINT Apply 1  application topically as needed. For place under arm 04/25/13   Jeanann Lewandowsky, MD  traMADol (ULTRAM) 50 MG tablet Take 1 tablet (50 mg total) by mouth every 8 (eight) hours as needed. 04/25/13   Jeanann Lewandowsky, MD     Objective:   Filed Vitals:   04/25/13 1305  BP: 134/86  Pulse: 88  Temp: 98.2 F (36.8 C)  Resp: 16  Weight: 339 lb (153.769 kg)  SpO2: 97%    Exam General appearance : Awake, alert, not in any distress. Speech Clear. Not toxic looking,  morbidly obese, looks unkempt. multiple flaky skin and hair HEENT: Atraumatic and Normocephalic, pupils equally reactive to light and accomodation Neck: supple, no JVD. No cervical lymphadenopathy.  Chest:Good air entry bilaterally, no added sounds  CVS: S1 S2 regular, no murmurs.  Abdomen: Bowel sounds present, Non tender and not distended with no gaurding, rigidity or rebound. Extremities: B/L Lower Ext shows edema with maceration of the left foot, no sign of infection or inflammation, there's multiple scaly lesions both feet  mildly tender to touch Neurology: Awake alert, and oriented X 3, CN II-XII intact, Non focal Skin:No Rash Wounds:N/A   Data Review   CBC No results found for this basename: WBC, HGB, HCT, PLT, MCV, MCH, MCHC, RDW, NEUTRABS, LYMPHSABS, MONOABS, EOSABS, BASOSABS, BANDABS, BANDSABD,  in the last 168 hours  Chemistries   No results found for this basename: NA, K, CL, CO2, GLUCOSE, BUN, CREATININE, GFRCGP, CALCIUM,  MG, AST, ALT, ALKPHOS, BILITOT,  in the last 168 hours ------------------------------------------------------------------------------------------------------------------ No results found for this basename: HGBA1C,  in the last 72 hours ------------------------------------------------------------------------------------------------------------------ No results found for this basename: CHOL, HDL, LDLCALC, TRIG, CHOLHDL, LDLDIRECT,  in the last 72  hours ------------------------------------------------------------------------------------------------------------------ No results found for this basename: TSH, T4TOTAL, FREET3, T3FREE, THYROIDAB,  in the last 72 hours ------------------------------------------------------------------------------------------------------------------ No results found for this basename: VITAMINB12, FOLATE, FERRITIN, TIBC, IRON, RETICCTPCT,  in the last 72 hours  Coagulation profile  No results found for this basename: INR, PROTIME,  in the last 168 hours    Assessment & Plan   Patient Active Problem List   Diagnosis Date Noted  . Cellulitis of leg, left 03/28/2013  . 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 Stasis dermatitis of both legs 03/28/2013  . Severe obesity (BMI >= 40) 03/28/2013  . Nummular eczema 12/05/2010  . Depression 12/05/2010  . TOBACCO USER 03/28/2009  . WHEEZING 06/04/2008  . INSOMNIA-SLEEP DISORDER-UNSPEC 11/28/2007  . ELEVATED BP READING WITHOUT DX HYPERTENSION 05/09/2007  . DIARRHEA, CHRONIC 11/16/2006  . OBESITY 08/28/2006  . ANXIETY DISORDER, GENERALIZED 08/28/2006  . GERD 08/28/2006  . BACK PAIN 08/28/2006  . ICHTHYOSIS 08/28/2006     Plan: Continue tramadol 50 mg tablet by mouth Q8 hour when necessary pain Add Tylenol No. 3 one tablet by mouth every 6 hour when necessary pain Triple antibiotic (Neosporin) ointment local application to wound Ambulatory referral to dermatologist  Patient extensively counseled about nutrition and exercise as well as her personal hygiene Patient counseled extensively about smoking cessation  Follow up in 4 weeks or when necessary  The patient was given clear instructions to go to ER or return to medical center if symptoms don't improve, worsen or new problems develop. The patient verbalized understanding. The patient was told to call to get lab results if they haven't heard anything in the next week.    Jeanann Lewandowsky, MD, MHA, FACP,  FAAP [facility] [place], [place] [phone]   04/25/2013, 1:29 PM

## 2013-05-24 ENCOUNTER — Ambulatory Visit: Payer: No Typology Code available for payment source | Attending: Internal Medicine | Admitting: Internal Medicine

## 2013-05-24 ENCOUNTER — Encounter: Payer: Self-pay | Admitting: Internal Medicine

## 2013-05-24 VITALS — BP 151/97 | HR 84 | Temp 98.2°F | Resp 16 | Ht 73.0 in | Wt 330.0 lb

## 2013-05-24 DIAGNOSIS — Q800 Ichthyosis vulgaris: Secondary | ICD-10-CM

## 2013-05-24 DIAGNOSIS — I8310 Varicose veins of unspecified lower extremity with inflammation: Secondary | ICD-10-CM

## 2013-05-24 DIAGNOSIS — Q809 Congenital ichthyosis, unspecified: Secondary | ICD-10-CM

## 2013-05-24 DIAGNOSIS — F329 Major depressive disorder, single episode, unspecified: Secondary | ICD-10-CM

## 2013-05-24 DIAGNOSIS — L02419 Cutaneous abscess of limb, unspecified: Secondary | ICD-10-CM | POA: Insufficient documentation

## 2013-05-24 DIAGNOSIS — L02619 Cutaneous abscess of unspecified foot: Secondary | ICD-10-CM

## 2013-05-24 DIAGNOSIS — L03116 Cellulitis of left lower limb: Secondary | ICD-10-CM

## 2013-05-24 DIAGNOSIS — I872 Venous insufficiency (chronic) (peripheral): Secondary | ICD-10-CM

## 2013-05-24 MED ORDER — ACETAMINOPHEN-CODEINE #3 300-30 MG PO TABS: 1.0000 | ORAL_TABLET | ORAL | Status: DC | PRN

## 2013-05-24 NOTE — Patient Instructions (Signed)
Stasis Dermatitis Stasis dermatitis occurs when veins lose the ability to pump blood back to the heart (poor venous circulation). It causes a reddish-purple to brownish scaly, itchy rash on the legs. The rash comes from pooling of blood (stasis). CAUSES  This occurs because the veins do not work very well anymore or because pressure may be increased in the veins due to other conditions. With blood pooling, the increased pressure in the tiny blood vessels (capillaries) causes fluid to leak out of the capillaries into the tissue. The extra fluid makes it harder for the blood to feed the cells and get rid of waste products. SYMPTOMS  Stasis dermatitis appears as red, scaly, itchy patches on the legs. A yellowish or light brown discoloration is also present. Due to scratching or other injury, these patches can become an ulcer. This ulcer may remain for long periods of time. The ulcer can also become infected. Swelling of the legs is often present with stasis dermatitis. If the leg is swollen, this increases the risk of infection and further damage to the skin. Sometimes, intense itching, tingling, and burning occurs before signs of stasis dermatitis appear. You may find yourself scratching the insides of your ankles or rubbing your ankles together before the rash appears. After healing, there are often brown spots on the affected skin. DIAGNOSIS  Your caregiver makes this diagnosis based on an exam. Other tests may be done to better understand the cause. TREATMENT  If underlying conditions are present, they must be treated. Some of these conditions are heart failure, thyroid problems, poor nutrition, and varicose veins.  Cortisone creams and ointments applied to the skin (topically) may be needed, as well as medicine to reduce swelling in the legs (diuretics).  Compression stockings or an elastic wrap may also be needed to reduce swelling.  If there is an infection, antibiotic medicines may also be  used. HOME CARE INSTRUCTIONS   Try to rest and raise (elevate) the affected leg above the level of the heart, if possible.  Burow's solution wet packs applied for 30 minutes, 3 times daily, will help the weepy rash. Stop using the packs before your skin gets too dry. You can also use a mixture of 3 parts white vinegar to 1 quart water.  Grease your legs daily with ointments, such as petroleum jelly, to fight dryness.  Avoid scratching or injuring the area. SEEK IMMEDIATE MEDICAL CARE IF:   Your rash gets worse.  An ulcer forms.  You have an oral temperature above 102 F (38.9 C), not controlled by medicine.  You have any other severe symptoms. Document Released: 09/15/2005 Document Revised: 08/29/2011 Document Reviewed: 10/26/2009 [facility] Patient Information 2014 [place], [place]. Cellulitis Cellulitis is an infection of the skin and the tissue beneath it. The infected area is usually red and tender. Cellulitis occurs most often in the arms and lower legs.  CAUSES  Cellulitis is caused by bacteria that enter the skin through cracks or cuts in the skin. The most common types of bacteria that cause cellulitis are Staphylococcus and Streptococcus. SYMPTOMS   Redness and warmth.  Swelling.  Tenderness or pain.  Fever. DIAGNOSIS  Your caregiver can usually determine what is wrong based on a physical exam. Blood tests may also be done. TREATMENT  Treatment usually involves taking an antibiotic medicine. HOME CARE INSTRUCTIONS   Take your antibiotics as directed. Finish them even if you start to feel better.  Keep the infected arm or leg elevated to reduce swelling.  Apply  a warm cloth to the affected area up to 4 times per day to relieve pain.  Only take over-the-counter or prescription medicines for pain, discomfort, or fever as directed by your caregiver.  Keep all follow-up appointments as directed by your caregiver. SEEK MEDICAL CARE IF:   You notice red streaks  coming from the infected area.  Your red area gets larger or turns dark in color.  Your bone or joint underneath the infected area becomes painful after the skin has healed.  Your infection returns in the same area or another area.  You notice a swollen bump in the infected area.  You develop new symptoms. SEEK IMMEDIATE MEDICAL CARE IF:   You have a fever.  You feel very sleepy.  You develop vomiting or diarrhea.  You have a general ill feeling (malaise) with muscle aches and pains. MAKE SURE YOU:   Understand these instructions.  Will watch your condition.  Will get help right away if you are not doing well or get worse. Document Released: 03/16/2005 Document Revised: 12/06/2011 Document Reviewed: 08/22/2011 [facility] Patient Information 2014 [place], [place].

## 2013-05-24 NOTE — Progress Notes (Signed)
Patient ID: Erik Howe, male   DOB: 1970-12-10, 42 y.o.   MRN: [id number]  CC: left foot swelling  HPI: 42 year old male with past medical history of depression, obesity and left foot cellulitis and swelling presented to clinic for followup and evaluation of left foot swelling. Patient was already on antibiotics including Keflex and what pt remembers doxycycline. This swelling and erythema it has not improved. Patient reports significant pain on ambulation.  No Known Allergies History reviewed. No pertinent past medical history. Current Outpatient Prescriptions on File Prior to Visit  Medication Sig Dispense Refill  . diphenhydrAMINE (BENADRYL) 25 MG tablet Take 25 mg by mouth every 6 (six) hours as needed. For allergies      . FLUoxetine (PROZAC) 20 MG tablet Take 1 tablet (20 mg total) by mouth daily.  30 tablet  3  . ibuprofen (ADVIL,MOTRIN) 200 MG tablet Take 400 mg by mouth every 6 (six) hours as needed.      . neomycin-bacitracin-polymyxin (NEOSPORIN) OINT Apply 1 application topically as needed. For place under arm  2 Tube  3  . traMADol (ULTRAM) 50 MG tablet Take 1 tablet (50 mg total) by mouth every 8 (eight) hours as needed.  45 tablet  0  . cephALEXin (KEFLEX) 500 MG capsule Take 1 capsule (500 mg total) by mouth 4 (four) times daily.  28 capsule  0  . furosemide (LASIX) 20 MG tablet Take 1 tablet (20 mg total) by mouth daily.  30 tablet  1  . HYDROcodone-acetaminophen (NORCO/VICODIN) 5-325 MG per tablet Take 1 tablet by mouth every 4 (four) hours as needed for pain.  15 tablet  0  . mupirocin cream (BACTROBAN) 2 % Apply topically 3 (three) times daily.  15 g  0  . [DISCONTINUED] albuterol (PROVENTIL HFA;VENTOLIN HFA) 108 (90 BASE) MCG/ACT inhaler Inhale 2 puffs into the lungs every 6 (six) hours as needed for wheezing.  1 Inhaler  2   No current facility-administered medications on file prior to visit.   Hypertension in mother.  History   Social History  . Marital Status:  Single    Spouse Name: N/A    Number of Children: N/A  . Years of Education: N/A   Occupational History  . Not on file.   Social History Main Topics  . Smoking status: Current Every Day Smoker -- 0.15 packs/day  . Smokeless tobacco: Not on file  . Alcohol Use: No  . Drug Use: No  . Sexual Activity: Not on file   Other Topics Concern  . Not on file   Social History Narrative  . No narrative on file    Review of Systems  Constitutional: Negative for fever, chills, diaphoresis, activity change, appetite change and fatigue.  HENT: Negative for ear pain, nosebleeds, congestion, facial swelling, rhinorrhea, neck pain, neck stiffness and ear discharge.   Eyes: Negative for pain, discharge, redness, itching and visual disturbance.  Respiratory: Negative for cough, choking, chest tightness, shortness of breath, wheezing and stridor.   Cardiovascular: Negative for chest pain, palpitations and leg swelling.  Gastrointestinal: Negative for abdominal distention.  Genitourinary: Negative for dysuria, urgency, frequency, hematuria, flank pain, decreased urine volume, difficulty urinating and dyspareunia.  Musculoskeletal: Negative for back pain, joint swelling, arthralgias and gait problem.  Neurological: Negative for dizziness, tremors, seizures, syncope, facial asymmetry, speech difficulty, weakness, light-headedness, numbness and headaches.  Hematological: Negative for adenopathy. Does not bruise/bleed easily.  Psychiatric/Behavioral: Negative for hallucinations, behavioral problems, confusion, dysphoric mood, decreased concentration and agitation.  Objective:   Filed Vitals:   05/24/13 1049  BP: 151/97  Pulse: 84  Temp: 98.2 F (36.8 C)  Resp: 16    Physical Exam  Constitutional: Appears well-developed and well-nourished. No distress.  HENT: Normocephalic. External right and left ear normal. Oropharynx is clear and moist.  Eyes: Conjunctivae and EOM are normal. PERRLA, no  scleral icterus.  Neck: Normal ROM. Neck supple. No JVD. No tracheal deviation. No thyromegaly.  CVS: RRR, S1/S2 +, no murmurs, no gallops, no carotid bruit.  Pulmonary: Effort and breath sounds normal, no stridor, rhonchi, wheezes, rales.  Abdominal: Soft. BS +,  no distension, tenderness, rebound or guarding.  Musculoskeletal: Normal range of motion. No edema and no tenderness.  Lymphadenopathy: No lymphadenopathy noted, cervical, inguinal. Neuro: Alert. Normal reflexes, muscle tone coordination. No cranial nerve deficit. Skin: Skin is warm and dry. Left foot cellulitis, scaling, stasis dermatitis  Psychiatric: Normal mood and affect. Behavior, judgment, thought content normal.   Lab Results  Component Value Date   WBC 10.4 03/18/2013   HGB 16.8 03/18/2013   HCT 46.0 03/18/2013   MCV 91.1 03/18/2013   PLT 232 03/18/2013   Lab Results  Component Value Date   CREATININE 0.75 03/18/2013   BUN 6 03/18/2013   NA 138 03/18/2013   K 3.6 03/18/2013   CL 100 03/18/2013   CO2 27 03/18/2013    No results found for this basename: HGBA1C   Lipid Panel  No results found for this basename: chol, trig, hdl, cholhdl, vldl, ldlcalc       Assessment and plan:   Patient Active Problem List   Diagnosis Date Noted  . Cellulitis of leg, left 03/28/2013    Priority: High - Obtain. Her Doppler study on the left leg  - Podiatry and orthopedic surgery referral for now  - Wound care referral   . Depression 12/05/2010    Priority: Medium - Continue Prozac

## 2013-05-24 NOTE — Progress Notes (Signed)
Pt is here following up on the wound that wont heal on his left foot.

## 2013-05-28 ENCOUNTER — Ambulatory Visit (HOSPITAL_COMMUNITY)
Admission: RE | Admit: 2013-05-28 | Discharge: 2013-05-28 | Disposition: A | Discharge status: Home / Self Care | Payer: No Typology Code available for payment source | Source: Ambulatory Visit | Attending: Internal Medicine | Admitting: Internal Medicine

## 2013-05-28 DIAGNOSIS — X58XXXA Exposure to other specified factors, initial encounter: Secondary | ICD-10-CM | POA: Insufficient documentation

## 2013-05-28 DIAGNOSIS — L02619 Cutaneous abscess of unspecified foot: Secondary | ICD-10-CM | POA: Insufficient documentation

## 2013-05-28 DIAGNOSIS — S91309A Unspecified open wound, unspecified foot, initial encounter: Secondary | ICD-10-CM | POA: Insufficient documentation

## 2013-05-28 DIAGNOSIS — R0989 Other specified symptoms and signs involving the circulatory and respiratory systems: Secondary | ICD-10-CM

## 2013-05-28 DIAGNOSIS — L03116 Cellulitis of left lower limb: Secondary | ICD-10-CM

## 2013-05-28 NOTE — Progress Notes (Signed)
VASCULAR LAB PRELIMINARY  ARTERIAL  ABI completed:    RIGHT    LEFT    PRESSURE WAVEFORM  PRESSURE WAVEFORM  BRACHIAL  Triphasic  BRACHIAL  Triphasic   DP   DP    AT 175 Triphasic  AT 173 Triphasic   PT 171 Triphasic PT 167 Triphasic   PER   PER    GREAT TOE  NA GREAT TOE  NA    RIGHT LEFT  ABI 1.14 1.13     Fayrene Towner, RVT 05/28/2013, 11:16 AM

## 2013-06-19 ENCOUNTER — Encounter (HOSPITAL_BASED_OUTPATIENT_CLINIC_OR_DEPARTMENT_OTHER): Payer: No Typology Code available for payment source | Attending: General Surgery

## 2013-06-19 DIAGNOSIS — F3289 Other specified depressive episodes: Secondary | ICD-10-CM | POA: Insufficient documentation

## 2013-06-19 DIAGNOSIS — Z79899 Other long term (current) drug therapy: Secondary | ICD-10-CM | POA: Insufficient documentation

## 2013-06-19 DIAGNOSIS — E669 Obesity, unspecified: Secondary | ICD-10-CM | POA: Insufficient documentation

## 2013-06-19 DIAGNOSIS — L97909 Non-pressure chronic ulcer of unspecified part of unspecified lower leg with unspecified severity: Secondary | ICD-10-CM | POA: Insufficient documentation

## 2013-06-19 DIAGNOSIS — F329 Major depressive disorder, single episode, unspecified: Secondary | ICD-10-CM | POA: Insufficient documentation

## 2013-06-19 DIAGNOSIS — I872 Venous insufficiency (chronic) (peripheral): Secondary | ICD-10-CM | POA: Insufficient documentation

## 2013-06-19 DIAGNOSIS — I87319 Chronic venous hypertension (idiopathic) with ulcer of unspecified lower extremity: Secondary | ICD-10-CM | POA: Insufficient documentation

## 2013-06-19 DIAGNOSIS — Z9089 Acquired absence of other organs: Secondary | ICD-10-CM | POA: Insufficient documentation

## 2013-06-20 NOTE — Progress Notes (Signed)
[facility]  NAME:  Erik Howe, Erik Howe NO.:  [id number]  MEDICAL RECORD NO.:  [id number]      DATE OF BIRTH:  [birth year]  PHYSICIAN:  Judene Companion, M.D.           VISIT DATE:                                  OFFICE VISIT   This is a 1st visit for this 43 year old, obese gentleman who comes to [place] with venous stasis and venous ulcers from his venous hypertension. When he came here today, he was found to have a blood pressure 141/93, respirations 17, pulse 93, temperature 98.7.  He weighs 350 pounds.  He has a history of depression along with his obesity.  His only surgery was a cholecystectomy.  His medicines that he is on is hydrocodone for pain and fluoxetine that he takes 20 mg a day.  He was examined today and he was found to have, along with his obesity.  His legs are very dry and scaly with small ulcers on the left leg.  He would definitely be diagnosed with venous hypertension and chronic venous ulcers.  I am going to treat him with compression and because of all the dry skin we put a lot of bag balm on him and along with the Unna boots.  He was instructed to elevate his feet whenever he can and will have him come back here in a week.  So, his diagnosis is chronic venous hypertension; chronic venous ulcers, bilateral legs; obesity; and history of depression.     Judene Companion, M.D.     PP/MEDQ  D:  06/19/2013  T:  06/20/2013  Job:  [id number]

## 2013-06-24 ENCOUNTER — Ambulatory Visit: Payer: No Typology Code available for payment source | Attending: Internal Medicine | Admitting: Internal Medicine

## 2013-06-24 ENCOUNTER — Encounter: Payer: Self-pay | Admitting: Internal Medicine

## 2013-06-24 DIAGNOSIS — Q800 Ichthyosis vulgaris: Secondary | ICD-10-CM

## 2013-06-24 DIAGNOSIS — I8310 Varicose veins of unspecified lower extremity with inflammation: Secondary | ICD-10-CM

## 2013-06-24 DIAGNOSIS — Q809 Congenital ichthyosis, unspecified: Secondary | ICD-10-CM

## 2013-06-24 DIAGNOSIS — E669 Obesity, unspecified: Secondary | ICD-10-CM | POA: Insufficient documentation

## 2013-06-24 DIAGNOSIS — I872 Venous insufficiency (chronic) (peripheral): Secondary | ICD-10-CM | POA: Insufficient documentation

## 2013-06-24 DIAGNOSIS — L03119 Cellulitis of unspecified part of limb: Secondary | ICD-10-CM

## 2013-06-24 DIAGNOSIS — L02419 Cutaneous abscess of limb, unspecified: Secondary | ICD-10-CM

## 2013-06-24 DIAGNOSIS — L03116 Cellulitis of left lower limb: Secondary | ICD-10-CM

## 2013-06-24 DIAGNOSIS — Z23 Encounter for immunization: Secondary | ICD-10-CM

## 2013-06-24 MED ORDER — ACETAMINOPHEN-CODEINE #3 300-30 MG PO TABS: 1.0000 | ORAL_TABLET | ORAL | Status: DC | PRN

## 2013-06-24 NOTE — Progress Notes (Signed)
Pt is here following up on his open wound on his left foot.

## 2013-06-24 NOTE — Progress Notes (Signed)
Patient ID: Erik Howe, male   DOB: [birth year], 43 y.o.   MRN: [id number] Patient Demographics  Erik Howe, is a 43 y.o. male  [id number]  [id number]  DOB - 01-08-1971  Chief Complaint  Patient presents with  . Follow-up        Subjective:   Erik Howe is a 43 y.o. male here today for a follow up visit. Patient is known to have stasis dermatitis with recurrent cellulitis of the left leg, he has had multiple antibiotics regimen, now following up with Wound Care. Erythema and discharge improved significantly. Patient is here only to followup as scheduled. No new complaints. Pain is moderate, patient is working on his weight, claims compliant with medications and exercise regimen. He has appointment with wound care on Wednesday this week. He continues to smoke cigarette, denies the use of alcohol. He has all his medications except for pain. No fever. Patient has No headache, No chest pain, No abdominal pain - No Nausea, No new weakness tingling or numbness, No Cough - SOB.  ALLERGIES: No Known Allergies  PAST MEDICAL HISTORY: History reviewed. No pertinent past medical history.  MEDICATIONS AT HOME: Prior to Admission medications   Medication Sig Start Date End Date Taking? Authorizing Provider  acetaminophen-codeine (TYLENOL #3) 300-30 MG per tablet Take 1 tablet by mouth every 4 (four) hours as needed for moderate pain. 06/24/13   Angelica Chessman, MD  cephALEXin (KEFLEX) 500 MG capsule Take 1 capsule (500 mg total) by mouth 4 (four) times daily. 03/28/13   Angelica Chessman, MD  diphenhydrAMINE (BENADRYL) 25 MG tablet Take 25 mg by mouth every 6 (six) hours as needed. For allergies    Historical Provider, MD  FLUoxetine (PROZAC) 20 MG tablet Take 1 tablet (20 mg total) by mouth daily. 04/25/13   Angelica Chessman, MD  furosemide (LASIX) 20 MG tablet Take 1 tablet (20 mg total) by mouth daily. 03/28/13   Angelica Chessman, MD  HYDROcodone-acetaminophen (NORCO/VICODIN) 5-325 MG per  tablet Take 1 tablet by mouth every 4 (four) hours as needed for pain. 03/18/13   Clayton Bibles, PA-C  ibuprofen (ADVIL,MOTRIN) 200 MG tablet Take 400 mg by mouth every 6 (six) hours as needed.    Historical Provider, MD  mupirocin cream (BACTROBAN) 2 % Apply topically 3 (three) times daily. 03/28/13   Angelica Chessman, MD  neomycin-bacitracin-polymyxin (NEOSPORIN) OINT Apply 1 application topically as needed. For place under arm 04/25/13   Angelica Chessman, MD  traMADol (ULTRAM) 50 MG tablet Take 1 tablet (50 mg total) by mouth every 8 (eight) hours as needed. 04/25/13   Angelica Chessman, MD     Objective:   Filed Vitals:   06/24/13 0951  BP: 159/98  Pulse: 95  Temp: 98.8 F (37.1 C)  TempSrc: Oral  Resp: 16  Height: 6\' 2"  (1.88 m)  Weight: 325 lb (147.419 kg)  SpO2: 98%    Exam General appearance : Awake, alert, not in any distress. Speech Clear. Not toxic looking, obese HEENT: Atraumatic and Normocephalic, pupils equally reactive to light and accomodation Neck: supple, no JVD. No cervical lymphadenopathy.  Chest:Good air entry bilaterally, no added sounds  CVS: S1 S2 regular, no murmurs.  Abdomen: Bowel sounds present, Non tender and not distended with no gaurding, rigidity or rebound. Extremities: Stasis dermatitis both legs worse on the left and dry scales,  No erythema, no discharge Neurology: Awake alert, and oriented X 3, CN II-XII intact, Non focal Skin:No Rash Wounds:N/A   Data Review   CBC  No results found for this basename: WBC, HGB, HCT, PLT, MCV, MCH, MCHC, RDW, NEUTRABS, LYMPHSABS, MONOABS, EOSABS, BASOSABS, BANDABS, BANDSABD,  in the last 168 hours  Chemistries   No results found for this basename: NA, K, CL, CO2, GLUCOSE, BUN, CREATININE, GFRCGP, CALCIUM, MG, AST, ALT, ALKPHOS, BILITOT,  in the last 168 hours ------------------------------------------------------------------------------------------------------------------ No results found for this basename:  HGBA1C,  in the last 72 hours ------------------------------------------------------------------------------------------------------------------ No results found for this basename: CHOL, HDL, LDLCALC, TRIG, CHOLHDL, LDLDIRECT,  in the last 72 hours ------------------------------------------------------------------------------------------------------------------ No results found for this basename: TSH, T4TOTAL, FREET3, T3FREE, THYROIDAB,  in the last 72 hours ------------------------------------------------------------------------------------------------------------------ No results found for this basename: VITAMINB12, FOLATE, FERRITIN, TIBC, IRON, RETICCTPCT,  in the last 72 hours  Coagulation profile  No results found for this basename: INR, PROTIME,  in the last 168 hours    Assessment & Plan   1. Severe obesity (BMI >= 40) Patient extensively counseled about nutrition and exercise  2. Stasis dermatitis of both legs with pain Followup with wound care as arranged - acetaminophen-codeine (TYLENOL #3) 300-30 MG per tablet; Take 1 tablet by mouth every 4 (four) hours as needed for moderate pain.  Dispense: 60 tablet; Refill: 0  3. Ichthyosis vulgaris  Health Maintenance -Vaccinations:  -Influenza given today  Patient extensively counseled about smoking cessation  Follow up in 4 weeks call when necessary   The patient was given clear instructions to go to ER or return to medical center if symptoms don't improve, worsen or new problems develop. The patient verbalized understanding. The patient was told to call to get lab results if they haven't heard anything in the next week.    Angelica Chessman, MD, [place], [place], [place] and [place], [place]   06/24/2013, 10:26 AM

## 2013-06-24 NOTE — Patient Instructions (Signed)
Wound Care Wound care helps prevent pain and infection.  You may need a tetanus shot if:  You cannot remember when you had your last tetanus shot.  You have never had a tetanus shot.  The injury broke your skin. If you need a tetanus shot and you choose not to have one, you may get tetanus. Sickness from tetanus can be serious. HOME CARE   Only take medicine as told by your doctor.  Clean the wound daily with mild soap and water.  Change any bandages (dressings) as told by your doctor.  Put medicated cream and a bandage on the wound as told by your doctor.  Change the bandage if it gets wet, dirty, or starts to smell.  Take showers. Do not take baths, swim, or do anything that puts your wound under water.  Rest and raise (elevate) the wound until the pain and puffiness (swelling) are better.  Keep all doctor visits as told. GET HELP RIGHT AWAY IF:   Yellowish-white fluid (pus) comes from the wound.  Medicine does not lessen your pain.  There is a red streak going away from the wound.  You have a fever. MAKE SURE YOU:   Understand these instructions.  Will watch your condition.  Will get help right away if you are not doing well or get worse. Document Released: 03/15/2008 Document Revised: 08/29/2011 Document Reviewed: 10/10/2010 [employer] Patient Information 2014 [employer].  

## 2013-06-26 ENCOUNTER — Encounter (HOSPITAL_BASED_OUTPATIENT_CLINIC_OR_DEPARTMENT_OTHER): Payer: No Typology Code available for payment source | Attending: General Surgery

## 2013-06-26 DIAGNOSIS — L97909 Non-pressure chronic ulcer of unspecified part of unspecified lower leg with unspecified severity: Principal | ICD-10-CM

## 2013-06-26 DIAGNOSIS — I87339 Chronic venous hypertension (idiopathic) with ulcer and inflammation of unspecified lower extremity: Secondary | ICD-10-CM | POA: Insufficient documentation

## 2013-06-26 DIAGNOSIS — L02419 Cutaneous abscess of limb, unspecified: Secondary | ICD-10-CM | POA: Insufficient documentation

## 2013-06-26 DIAGNOSIS — B369 Superficial mycosis, unspecified: Secondary | ICD-10-CM | POA: Insufficient documentation

## 2013-06-26 DIAGNOSIS — L03119 Cellulitis of unspecified part of limb: Secondary | ICD-10-CM

## 2013-07-12 ENCOUNTER — Other Ambulatory Visit (HOSPITAL_COMMUNITY): Payer: Self-pay | Admitting: General Surgery

## 2013-07-12 DIAGNOSIS — I739 Peripheral vascular disease, unspecified: Secondary | ICD-10-CM

## 2013-07-16 ENCOUNTER — Ambulatory Visit (HOSPITAL_COMMUNITY)
Admission: RE | Admit: 2013-07-16 | Discharge: 2013-07-16 | Disposition: A | Discharge status: Home / Self Care | Payer: No Typology Code available for payment source | Source: Ambulatory Visit | Attending: Cardiology | Admitting: Cardiology

## 2013-07-16 DIAGNOSIS — I739 Peripheral vascular disease, unspecified: Secondary | ICD-10-CM

## 2013-07-16 DIAGNOSIS — I872 Venous insufficiency (chronic) (peripheral): Secondary | ICD-10-CM | POA: Insufficient documentation

## 2013-07-16 DIAGNOSIS — I83893 Varicose veins of bilateral lower extremities with other complications: Secondary | ICD-10-CM | POA: Insufficient documentation

## 2013-07-16 DIAGNOSIS — M7989 Other specified soft tissue disorders: Secondary | ICD-10-CM

## 2013-07-16 DIAGNOSIS — L03116 Cellulitis of left lower limb: Secondary | ICD-10-CM

## 2013-07-16 NOTE — Progress Notes (Signed)
Venous Duplex Lower Ext. Completed. No evidence of DVT. Bilateral venous insufficiency in both the deep and the superficial systems.  Oda Cogan, BS, RDMS, RVT

## 2013-07-22 ENCOUNTER — Encounter: Payer: Self-pay | Admitting: Internal Medicine

## 2013-07-22 ENCOUNTER — Ambulatory Visit: Payer: No Typology Code available for payment source | Attending: Internal Medicine | Admitting: Internal Medicine

## 2013-07-22 DIAGNOSIS — L03116 Cellulitis of left lower limb: Secondary | ICD-10-CM

## 2013-07-22 DIAGNOSIS — I8310 Varicose veins of unspecified lower extremity with inflammation: Secondary | ICD-10-CM

## 2013-07-22 DIAGNOSIS — I872 Venous insufficiency (chronic) (peripheral): Secondary | ICD-10-CM | POA: Insufficient documentation

## 2013-07-22 DIAGNOSIS — F32A Depression, unspecified: Secondary | ICD-10-CM

## 2013-07-22 DIAGNOSIS — Q809 Congenital ichthyosis, unspecified: Secondary | ICD-10-CM

## 2013-07-22 DIAGNOSIS — F329 Major depressive disorder, single episode, unspecified: Secondary | ICD-10-CM

## 2013-07-22 DIAGNOSIS — Q800 Ichthyosis vulgaris: Secondary | ICD-10-CM

## 2013-07-22 DIAGNOSIS — E669 Obesity, unspecified: Secondary | ICD-10-CM | POA: Insufficient documentation

## 2013-07-22 DIAGNOSIS — L02419 Cutaneous abscess of limb, unspecified: Secondary | ICD-10-CM | POA: Insufficient documentation

## 2013-07-22 DIAGNOSIS — L03119 Cellulitis of unspecified part of limb: Secondary | ICD-10-CM

## 2013-07-22 DIAGNOSIS — F3289 Other specified depressive episodes: Secondary | ICD-10-CM

## 2013-07-22 LAB — POCT GLYCOSYLATED HEMOGLOBIN (HGB A1C): HEMOGLOBIN A1C: 4.9

## 2013-07-22 MED ORDER — FLUOXETINE HCL 20 MG PO TABS: 40.0000 mg | ORAL_TABLET | Freq: Every day | ORAL | Status: DC

## 2013-07-22 MED ORDER — ACETAMINOPHEN-CODEINE #3 300-30 MG PO TABS: 1.0000 | ORAL_TABLET | ORAL | Status: DC | PRN

## 2013-07-22 MED ORDER — SULFAMETHOXAZOLE-TMP DS 800-160 MG PO TABS: 1.0000 | ORAL_TABLET | Freq: Two times a day (BID) | ORAL | Status: DC

## 2013-07-22 NOTE — Progress Notes (Signed)
Patient ID: Erik Howe, male   DOB: [birth year], 43 y.o.   MRN: [id number] Patient Demographics  Erik Howe, is a 43 y.o. male  XFG:[id number]  RCV:[id number]  DOB - [birth year]  Chief Complaint  Patient presents with  . Follow-up        Subjective:   Erik Howe is a 43 y.o. male here today for a follow up visit. Patient is known to have stasis dermatitis with nonhealing ulcer of the left leg. He recently saw vascular surgeon, Doppler ultrasound of the legs were done which showed venous insufficiency but no DVT. There was no intervention from a vascular standpoint at this time. He continues to follow up with wound care. He is complaining of severe pain especially of the left leg which prevents him from proper ambulation and even working. He continues to smoke cigarette despite repeated counseling, he does not drink alcohol. He claims to still be depressed despite Prozac 20 mg tablet by mouth daily. He however denied suicidal attempt or ideation. Patient has No headache, No chest pain, No abdominal pain - No Nausea, No new weakness tingling or numbness, No Cough - SOB.  ALLERGIES: No Known Allergies  PAST MEDICAL HISTORY: History reviewed. No pertinent past medical history.  MEDICATIONS AT HOME: Prior to Admission medications   Medication Sig Start Date End Date Taking? Authorizing Provider  acetaminophen-codeine (TYLENOL #3) 300-30 MG per tablet Take 1 tablet by mouth every 4 (four) hours as needed for moderate pain. 07/22/13  Yes Angelica Chessman, MD  FLUoxetine (PROZAC) 20 MG tablet Take 2 tablets (40 mg total) by mouth daily. 07/22/13  Yes Angelica Chessman, MD  cephALEXin (KEFLEX) 500 MG capsule Take 1 capsule (500 mg total) by mouth 4 (four) times daily. 03/28/13   Angelica Chessman, MD  diphenhydrAMINE (BENADRYL) 25 MG tablet Take 25 mg by mouth every 6 (six) hours as needed. For allergies    Historical Provider, MD  furosemide (LASIX) 20 MG tablet Take 1 tablet (20 mg total) by mouth  daily. 03/28/13   Angelica Chessman, MD  HYDROcodone-acetaminophen (NORCO/VICODIN) 5-325 MG per tablet Take 1 tablet by mouth every 4 (four) hours as needed for pain. 03/18/13   Clayton Bibles, PA-C  ibuprofen (ADVIL,MOTRIN) 200 MG tablet Take 400 mg by mouth every 6 (six) hours as needed.    Historical Provider, MD  mupirocin cream (BACTROBAN) 2 % Apply topically 3 (three) times daily. 03/28/13   Angelica Chessman, MD  neomycin-bacitracin-polymyxin (NEOSPORIN) OINT Apply 1 application topically as needed. For place under arm 04/25/13   Angelica Chessman, MD  sulfamethoxazole-trimethoprim (BACTRIM DS) 800-160 MG per tablet Take 1 tablet by mouth 2 (two) times daily. 07/22/13   Angelica Chessman, MD  traMADol (ULTRAM) 50 MG tablet Take 1 tablet (50 mg total) by mouth every 8 (eight) hours as needed. 04/25/13   Angelica Chessman, MD     Objective:   Filed Vitals:   07/22/13 1053 07/22/13 1054  BP:  147/94  Pulse:  75  Temp:  98.1 F (36.7 C)  TempSrc:  Oral  Resp:  16  Height: 6\' 2"  (1.88 m)   Weight: 325 lb (147.419 kg)   SpO2:  96%    Exam General appearance : Awake, alert, not in any distress. Speech Clear. Not toxic looking, unkempt HEENT: Atraumatic and Normocephalic, pupils equally reactive to light and accomodation Neck: supple, no JVD. No cervical lymphadenopathy.  Chest:Good air entry bilaterally, no added sounds  CVS: S1 S2 regular, no murmurs.  Abdomen: Bowel sounds  present, Non tender and not distended with no gaurding, rigidity or rebound. Extremities: Left leg in elastic bandage, slightly weeping, no active redness or pus discharge but concerning for impending infection.  Neurology: Awake alert, and oriented X 3, CN II-XII intact, Non focal Skin:No Rash Wounds:N/A   Data Review   CBC No results found for this basename: WBC, HGB, HCT, PLT, MCV, MCH, MCHC, RDW, NEUTRABS, LYMPHSABS, MONOABS, EOSABS, BASOSABS, BANDABS, BANDSABD,  in the last 168 hours  Chemistries   No  results found for this basename: NA, K, CL, CO2, GLUCOSE, BUN, CREATININE, GFRCGP, CALCIUM, MG, AST, ALT, ALKPHOS, BILITOT,  in the last 168 hours ------------------------------------------------------------------------------------------------------------------ No results found for this basename: HGBA1C,  in the last 72 hours ------------------------------------------------------------------------------------------------------------------ No results found for this basename: CHOL, HDL, LDLCALC, TRIG, CHOLHDL, LDLDIRECT,  in the last 72 hours ------------------------------------------------------------------------------------------------------------------ No results found for this basename: TSH, T4TOTAL, FREET3, T3FREE, THYROIDAB,  in the last 72 hours ------------------------------------------------------------------------------------------------------------------ No results found for this basename: VITAMINB12, FOLATE, FERRITIN, TIBC, IRON, RETICCTPCT,  in the last 72 hours  Coagulation profile  No results found for this basename: INR, PROTIME,  in the last 168 hours    Assessment & Plan   1. Severe obesity (BMI >= 40) Patient extensively counseled about nutrition and exercise  2. Stasis dermatitis of both legs For pain: - acetaminophen-codeine (TYLENOL #3) 300-30 MG per tablet; Take 1 tablet by mouth every 4 (four) hours as needed for moderate pain.  Dispense: 90 tablet; Refill: 0  3. Cellulitis of leg, left - sulfamethoxazole-trimethoprim (BACTRIM DS) 800-160 MG per tablet; Take 1 tablet by mouth 2 (two) times daily.  Dispense: 20 tablet; Refill: 0 - HgB A1c  4. Depression  - FLUoxetine (PROZAC) 20 MG tablet; Take 2 tablets (40 mg total) by mouth daily.  Dispense: 60 tablet; Refill: 3  Patient was counseled extensively about smoking cessation and personal hygiene  Follow up in 3 months or when necessary  The patient was given clear instructions to go to ER or return to medical  center if symptoms don't improve, worsen or new problems develop. The patient verbalized understanding. The patient was told to call to get lab results if they haven't heard anything in the next week.    Angelica Chessman, MD, [place], [place], [place] [facility] [place], [place]   07/22/2013, 11:51 AM

## 2013-07-22 NOTE — Progress Notes (Signed)
Pt is here following up on the non healing wound on his left foot.

## 2013-07-24 ENCOUNTER — Encounter (HOSPITAL_BASED_OUTPATIENT_CLINIC_OR_DEPARTMENT_OTHER): Payer: No Typology Code available for payment source | Attending: General Surgery

## 2013-07-24 DIAGNOSIS — L97509 Non-pressure chronic ulcer of other part of unspecified foot with unspecified severity: Secondary | ICD-10-CM | POA: Insufficient documentation

## 2013-07-26 ENCOUNTER — Ambulatory Visit: Payer: Self-pay

## 2013-08-21 ENCOUNTER — Encounter (HOSPITAL_BASED_OUTPATIENT_CLINIC_OR_DEPARTMENT_OTHER): Payer: No Typology Code available for payment source | Attending: General Surgery

## 2013-08-21 DIAGNOSIS — L97509 Non-pressure chronic ulcer of other part of unspecified foot with unspecified severity: Secondary | ICD-10-CM | POA: Insufficient documentation

## 2013-08-23 ENCOUNTER — Encounter: Payer: Self-pay | Admitting: Vascular Surgery

## 2013-08-26 ENCOUNTER — Ambulatory Visit (INDEPENDENT_AMBULATORY_CARE_PROVIDER_SITE_OTHER): Payer: No Typology Code available for payment source | Admitting: Vascular Surgery

## 2013-08-26 ENCOUNTER — Encounter: Payer: Self-pay | Admitting: Vascular Surgery

## 2013-08-26 VITALS — BP 138/87 | HR 82 | Resp 18 | Ht 74.0 in | Wt 323.0 lb

## 2013-08-26 DIAGNOSIS — I83893 Varicose veins of bilateral lower extremities with other complications: Secondary | ICD-10-CM | POA: Insufficient documentation

## 2013-08-26 NOTE — Progress Notes (Signed)
Subjective:     Patient ID: Erik Howe, male   DOB: 1971-05-11, 43 y.o.   MRN: [id number]  HPI this 43 year old male was referred to the wound center with a recently healed left lower extremity stasis ulcer. Patient had an Unna boots are approximately 2 months. Ulcers now currently healed. He has no history of DVT, thrombophlebitis, or previous stasis ulcers. He has had varicose veins for many years. The skin has become much darker in the lower third of the leg over the past year or so. He does not wear elastic compression stockings were elevate his legs were taken a medication on a regular basis.   Past Medical History  Diagnosis Date  . Varicose veins     History  Substance Use Topics  . Smoking status: Current Every Day Smoker -- 0.15 packs/day  . Smokeless tobacco: Never Used  . Alcohol Use: No    Family History  Problem Relation Age of Onset  . Varicose Veins Mother   . Heart disease Mother   . Diabetes Mother   . Arthritis Mother   . Hypertension Mother   . Stroke Father   . Heart disease Father     No Known Allergies  Current outpatient prescriptions:acetaminophen-codeine (TYLENOL #3) 300-30 MG per tablet, Take 1 tablet by mouth every 4 (four) hours as needed for moderate pain., Disp: 90 tablet, Rfl: 0;  diphenhydrAMINE (BENADRYL) 25 MG tablet, Take 25 mg by mouth every 6 (six) hours as needed. For allergies, Disp: , Rfl: ;  FLUoxetine (PROZAC) 20 MG tablet, Take 2 tablets (40 mg total) by mouth daily., Disp: 60 tablet, Rfl: 3 ibuprofen (ADVIL,MOTRIN) 200 MG tablet, Take 400 mg by mouth every 6 (six) hours as needed., Disp: , Rfl: ;  cephALEXin (KEFLEX) 500 MG capsule, Take 1 capsule (500 mg total) by mouth 4 (four) times daily., Disp: 28 capsule, Rfl: 0;  furosemide (LASIX) 20 MG tablet, Take 1 tablet (20 mg total) by mouth daily., Disp: 30 tablet, Rfl: 1 HYDROcodone-acetaminophen (NORCO/VICODIN) 5-325 MG per tablet, Take 1 tablet by mouth every 4 (four) hours as needed for  pain., Disp: 15 tablet, Rfl: 0;  mupirocin cream (BACTROBAN) 2 %, Apply topically 3 (three) times daily., Disp: 15 g, Rfl: 0;  neomycin-bacitracin-polymyxin (NEOSPORIN) OINT, Apply 1 application topically as needed. For place under arm, Disp: 2 Tube, Rfl: 3 sulfamethoxazole-trimethoprim (BACTRIM DS) 800-160 MG per tablet, Take 1 tablet by mouth 2 (two) times daily., Disp: 20 tablet, Rfl: 0;  traMADol (ULTRAM) 50 MG tablet, Take 1 tablet (50 mg total) by mouth every 8 (eight) hours as needed., Disp: 45 tablet, Rfl: 0;  [DISCONTINUED] albuterol (PROVENTIL HFA;VENTOLIN HFA) 108 (90 BASE) MCG/ACT inhaler, Inhale 2 puffs into the lungs every 6 (six) hours as needed for wheezing., Disp: 1 Inhaler, Rfl: 2  BP 138/87  Pulse 82  Resp 18  Ht 6\' 2"  (1.88 m)  Wt 323 lb (146.512 kg)  BMI 41.45 kg/m2  Body mass index is 41.45 kg/(m^2).          Review of Systems patient complains of dyspnea on exertion. Swelling in legs, weakness in arms and legs, numbness in arms and legs, dizziness, aching, throbbing, burning, discomfort in the left leg. Other systems negative and complete review of systems other than morbid obesity    Objective:   Physical Exam BP 138/87  Pulse 82  Resp 18  Ht 6\' 2"  (1.88 m)  Wt 323 lb (146.512 kg)  BMI 41.45 kg/m2  Gen.-alert and  oriented x3 in no apparent distress-morbidly obese  HEENT normal for age Lungs no rhonchi or wheezing Cardiovascular regular rhythm no murmurs carotid pulses 3+ palpable no bruits audible Abdomen soft nontender no palpable masses-obese Musculoskeletal free of  major deformities Skin clear -hyperpigmentation left leg lower third. No active ulceration noted. Neurologic normal Lower extremities 3+ femoral and dorsalis pedis pulses palpable bilaterally with 1+ edema bilaterally. No active ulcer noted. Bulging varicosities in both legs in the medial thigh and medial calf regions.  Today are reviewed the venous duplex exam which was performed at  another facility which revealed gross reflux in the left great saphenous vein as well as the right great saphenous vein with also reflux in the deep systems.       Assessment:     Recently healed stasis ulcer left ankle with gross reflux left great saphenous system and deep system    Plan:     Patient needs laser ablation left great saphenous vein We'll schedule this in the near future

## 2013-08-29 ENCOUNTER — Other Ambulatory Visit: Payer: Self-pay | Admitting: *Deleted

## 2013-08-29 DIAGNOSIS — I83893 Varicose veins of bilateral lower extremities with other complications: Secondary | ICD-10-CM

## 2013-09-10 ENCOUNTER — Encounter: Payer: Self-pay | Admitting: Vascular Surgery

## 2013-09-10 ENCOUNTER — Encounter (HOSPITAL_COMMUNITY): Payer: Self-pay

## 2013-09-11 ENCOUNTER — Encounter: Payer: Self-pay | Admitting: Internal Medicine

## 2013-09-11 ENCOUNTER — Ambulatory Visit: Payer: No Typology Code available for payment source | Attending: Internal Medicine | Admitting: Internal Medicine

## 2013-09-11 VITALS — BP 132/87 | HR 94 | Temp 98.4°F | Resp 16 | Wt 326.8 lb

## 2013-09-11 DIAGNOSIS — Z760 Encounter for issue of repeat prescription: Secondary | ICD-10-CM | POA: Insufficient documentation

## 2013-09-11 DIAGNOSIS — R0981 Nasal congestion: Secondary | ICD-10-CM

## 2013-09-11 DIAGNOSIS — I83893 Varicose veins of bilateral lower extremities with other complications: Secondary | ICD-10-CM

## 2013-09-11 DIAGNOSIS — Z79899 Other long term (current) drug therapy: Secondary | ICD-10-CM | POA: Insufficient documentation

## 2013-09-11 DIAGNOSIS — J329 Chronic sinusitis, unspecified: Secondary | ICD-10-CM

## 2013-09-11 DIAGNOSIS — J3489 Other specified disorders of nose and nasal sinuses: Secondary | ICD-10-CM | POA: Insufficient documentation

## 2013-09-11 DIAGNOSIS — M79609 Pain in unspecified limb: Secondary | ICD-10-CM | POA: Insufficient documentation

## 2013-09-11 DIAGNOSIS — M79606 Pain in leg, unspecified: Secondary | ICD-10-CM

## 2013-09-11 DIAGNOSIS — F17200 Nicotine dependence, unspecified, uncomplicated: Secondary | ICD-10-CM | POA: Insufficient documentation

## 2013-09-11 MED ORDER — ACETAMINOPHEN-CODEINE #3 300-30 MG PO TABS: 1.0000 | ORAL_TABLET | ORAL | Status: DC | PRN

## 2013-09-11 MED ORDER — FLUTICASONE PROPIONATE 50 MCG/ACT NA SUSP: 2.0000 | Freq: Every day | NASAL | Status: DC

## 2013-09-11 MED ORDER — SULFAMETHOXAZOLE-TMP DS 800-160 MG PO TABS: 1.0000 | ORAL_TABLET | Freq: Two times a day (BID) | ORAL | Status: DC

## 2013-09-11 NOTE — Progress Notes (Signed)
Patient is here for follow up Still having pain to his left leg Complains of cough and chest congestion

## 2013-09-11 NOTE — Progress Notes (Signed)
MRN: [id number] Name: Erik Howe  Sex: male Age: 43 y.o. DOB: 07-10-1970  Allergies: Review of patient's allergies indicates no known allergies.  Chief Complaint  Patient presents with  . Follow-up    HPI: Patient is 43 y.o. male who has chronic lower leg pain varicose veins stasis dermatitis, comes today requesting refill on pain medication, he was recently seen by a vascular Dr. and is going to have laser ablation done, patient also reported to have lot of nasal congestion postnasal drip some productive cough denies any chest pain or shortness of breath.  Past Medical History  Diagnosis Date  . Varicose veins     Past Surgical History  Procedure Laterality Date  . Cholecystectomy        Medication List       This list is accurate as of: 09/11/13  5:11 PM.  Always use your most recent med list.               acetaminophen-codeine 300-30 MG per tablet  Commonly known as:  TYLENOL #3  Take 1 tablet by mouth every 4 (four) hours as needed for moderate pain.     cephALEXin 500 MG capsule  Commonly known as:  KEFLEX  Take 1 capsule (500 mg total) by mouth 4 (four) times daily.     diphenhydrAMINE 25 MG tablet  Commonly known as:  BENADRYL  Take 25 mg by mouth every 6 (six) hours as needed. For allergies     FLUoxetine 20 MG tablet  Commonly known as:  PROZAC  Take 2 tablets (40 mg total) by mouth daily.     fluticasone 50 MCG/ACT nasal spray  Commonly known as:  FLONASE  Place 2 sprays into both nostrils daily.     furosemide 20 MG tablet  Commonly known as:  LASIX  Take 1 tablet (20 mg total) by mouth daily.     HYDROcodone-acetaminophen 5-325 MG per tablet  Commonly known as:  NORCO/VICODIN  Take 1 tablet by mouth every 4 (four) hours as needed for pain.     ibuprofen 200 MG tablet  Commonly known as:  ADVIL,MOTRIN  Take 400 mg by mouth every 6 (six) hours as needed.     mupirocin cream 2 %  Commonly known as:  BACTROBAN  Apply topically 3  (three) times daily.     neomycin-bacitracin-polymyxin Oint  Commonly known as:  NEOSPORIN  Apply 1 application topically as needed. For place under arm     sulfamethoxazole-trimethoprim 800-160 MG per tablet  Commonly known as:  BACTRIM DS  Take 1 tablet by mouth 2 (two) times daily.     sulfamethoxazole-trimethoprim 800-160 MG per tablet  Commonly known as:  BACTRIM DS  Take 1 tablet by mouth 2 (two) times daily.     traMADol 50 MG tablet  Commonly known as:  ULTRAM  Take 1 tablet (50 mg total) by mouth every 8 (eight) hours as needed.        Meds ordered this encounter  Medications  . sulfamethoxazole-trimethoprim (BACTRIM DS) 800-160 MG per tablet    Sig: Take 1 tablet by mouth 2 (two) times daily.    Dispense:  20 tablet    Refill:  0  . fluticasone (FLONASE) 50 MCG/ACT nasal spray    Sig: Place 2 sprays into both nostrils daily.    Dispense:  16 g    Refill:  6  . acetaminophen-codeine (TYLENOL #3) 300-30 MG per tablet    Sig: Take  1 tablet by mouth every 4 (four) hours as needed for moderate pain.    Dispense:  90 tablet    Refill:  0    Immunization History  Administered Date(s) Administered  . Influenza Whole 05/09/2007  . Influenza,inj,Quad PF,36+ Mos 06/24/2013    Family History  Problem Relation Age of Onset  . Varicose Veins Mother   . Heart disease Mother   . Diabetes Mother   . Arthritis Mother   . Hypertension Mother   . Stroke Father   . Heart disease Father     History  Substance Use Topics  . Smoking status: Current Every Day Smoker -- 0.15 packs/day  . Smokeless tobacco: Never Used  . Alcohol Use: No    Review of Systems   As noted in HPI  Filed Vitals:   09/11/13 1700  BP: 132/87  Pulse: 94  Temp: 98.4 F (36.9 C)  Resp: 16    Physical Exam  Physical Exam  HENT:  Nasal congestion, sinus tenderness   Eyes: EOM are normal. Pupils are equal, round, and reactive to light.  Cardiovascular: Normal rate and regular rhythm.    Pulmonary/Chest: Breath sounds normal. No respiratory distress. He has no wheezes. He has no rales.  Musculoskeletal:  Left leg chronic stasis changes varicose veins     CBC    Component Value Date/Time   WBC 10.4 03/18/2013 0557   RBC 5.05 03/18/2013 0557   HGB 16.8 03/18/2013 0557   HCT 46.0 03/18/2013 0557   PLT 232 03/18/2013 0557   MCV 91.1 03/18/2013 0557   LYMPHSABS 3.8 03/18/2013 0557   MONOABS 0.8 03/18/2013 0557   EOSABS 0.2 03/18/2013 0557   BASOSABS 0.0 03/18/2013 0557    CMP     Component Value Date/Time   NA 138 03/18/2013 0557   K 3.6 03/18/2013 0557   CL 100 03/18/2013 0557   CO2 27 03/18/2013 0557   GLUCOSE 107* 03/18/2013 0557   BUN 6 03/18/2013 0557   CREATININE 0.75 03/18/2013 0557   CALCIUM 9.1 03/18/2013 0557   PROT 7.1 01/05/2007 2208   ALBUMIN 4.2 01/05/2007 2208   AST 20 01/05/2007 2208   ALT 28 01/05/2007 2208   ALKPHOS 48 01/05/2007 2208   BILITOT 0.7 01/05/2007 2208   GFRNONAA >90 03/18/2013 0557   GFRAA >90 03/18/2013 0557    No results found for this basename: chol, tri, ldl    No components found with this basename: hga1c    Lab Results  Component Value Date/Time   AST 20 01/05/2007 10:08 PM    Assessment and Plan  Varicose veins of lower extremities with other complications Following up with her vascular.  Leg pain - Plan: Pain medication refill done acetaminophen-codeine (TYLENOL #3) 300-30 MG per tablet  Sinusitis - Plan: sulfamethoxazole-trimethoprim (BACTRIM DS) 800-160 MG per tablet  Nasal congestion - Plan: fluticasone (FLONASE) 50 MCG/ACT nasal spray Advised patient for saltwater gargles Follow up as scheduled  Lorayne Marek, MD

## 2013-09-17 ENCOUNTER — Other Ambulatory Visit: Payer: Self-pay | Admitting: *Deleted

## 2013-09-17 DIAGNOSIS — I83893 Varicose veins of bilateral lower extremities with other complications: Secondary | ICD-10-CM

## 2013-09-27 ENCOUNTER — Encounter: Payer: Self-pay | Admitting: Vascular Surgery

## 2013-09-30 ENCOUNTER — Ambulatory Visit (INDEPENDENT_AMBULATORY_CARE_PROVIDER_SITE_OTHER): Payer: No Typology Code available for payment source | Admitting: Vascular Surgery

## 2013-09-30 ENCOUNTER — Other Ambulatory Visit: Payer: Self-pay | Admitting: *Deleted

## 2013-09-30 ENCOUNTER — Encounter: Payer: Self-pay | Admitting: Vascular Surgery

## 2013-09-30 VITALS — BP 141/86 | HR 81 | Resp 18 | Ht 73.0 in | Wt 370.0 lb

## 2013-09-30 DIAGNOSIS — I83893 Varicose veins of bilateral lower extremities with other complications: Secondary | ICD-10-CM

## 2013-09-30 HISTORY — PX: ENDOVENOUS ABLATION SAPHENOUS VEIN W/ LASER: SUR449

## 2013-09-30 NOTE — Progress Notes (Signed)
   Laser Ablation Procedure      Date: 09/30/2013    Erik Howe DOB:12/06/1970  Consent signed: Yes  Surgeon:J.D. Kellie Simmering  Procedure: Laser Ablation: left Greater Saphenous Vein  BP 141/86  Pulse 81  Resp 18  Ht 6\' 1"  (1.854 m)  Wt 370 lb (167.831 kg)  BMI 48.83 kg/m2  Start time: 10:50   End time: 11:30  Tumescent Anesthesia: 300 cc 0.9% NaCl with 50 cc Lidocaine HCL with 1% Epi and 15 cc 8.4% NaHCO3  Local Anesthesia: 5 cc Lidocaine HCL and NaHCO3 (ratio 2:1)  Pulsed mode: 15 watts, 564ms delay, 1.0 duration Total energy: 1901, total pulses: 127, total time: 2:07     Patient tolerated procedure well: Yes  Notes:   Description of Procedure:  After marking the course of the saphenous vein and the secondary varicosities in the standing position, the patient was placed on the operating table in the supine position, and the left leg was prepped and draped in sterile fashion. Local anesthetic was administered, and under ultrasound guidance the saphenous vein was accessed with a micro needle and guide wire; then the micro puncture sheath was placed. A guide wire was inserted to the saphenofemoral junction, followed by a 5 french sheath.  The position of the sheath and then the laser fiber below the junction was confirmed using the ultrasound and visualization of the aiming beam.  Tumescent anesthesia was administered along the course of the saphenous vein using ultrasound guidance. Protective laser glasses were placed on the patient, and the laser was fired at 15 watt pulsed mode advancing 1-2 mm per sec.  For a total of 1901 joules.  A steri strip was applied to the puncture site.    ABD pads and thigh high compression stockings were applied.  Ace wrap bandages were applied over the phlebectomy sites and at the top of the saphenofemoral junction.  Blood loss was less than 15 cc.  The patient ambulated out of the operating room having tolerated the procedure well.

## 2013-09-30 NOTE — Progress Notes (Signed)
Subjective:     Patient ID: Erik Howe, male   DOB: 27-Nov-1970, 44 y.o.   MRN: [id number]  HPI this 43 year old male had laser ablation of the left great saphenous vein performed under local tumescent anesthesia for venous hypertension with a history of venous stasis ulcers in the left ankle. He tolerated the procedure well. A total of 1901 J of energy was utilized.  Review of Systems     Objective:   Physical Exam BP 141/86  Pulse 81  Resp 18  Ht 6\' 1"  (1.854 m)  Wt 370 lb (167.831 kg)  BMI 48.83 kg/m2       Assessment:     Well-tolerated laser ablation left great saphenous vein performed under local tumescent anesthesia for venous hypertension with history of venous stasis ulcers    Plan:     Return in one week for venous duplex exam to confirm closure left great saphenous vein. He will then go to the wound center for further treatment if any recurrent ulcerations of her

## 2013-10-01 ENCOUNTER — Telehealth: Payer: Self-pay | Admitting: *Deleted

## 2013-10-01 NOTE — Telephone Encounter (Signed)
Pt doing well. Slight discomfort. Following all instructions. 

## 2013-10-02 ENCOUNTER — Encounter: Payer: Self-pay | Admitting: Vascular Surgery

## 2013-10-07 ENCOUNTER — Encounter: Payer: Self-pay | Admitting: Vascular Surgery

## 2013-10-08 ENCOUNTER — Ambulatory Visit (HOSPITAL_COMMUNITY)
Admission: RE | Admit: 2013-10-08 | Discharge: 2013-10-08 | Disposition: A | Discharge status: Home / Self Care | Payer: No Typology Code available for payment source | Source: Ambulatory Visit | Attending: Vascular Surgery | Admitting: Vascular Surgery

## 2013-10-08 ENCOUNTER — Ambulatory Visit (INDEPENDENT_AMBULATORY_CARE_PROVIDER_SITE_OTHER): Payer: No Typology Code available for payment source | Admitting: Vascular Surgery

## 2013-10-08 ENCOUNTER — Encounter: Payer: Self-pay | Admitting: Vascular Surgery

## 2013-10-08 VITALS — BP 145/94 | HR 87 | Resp 18 | Ht 73.0 in | Wt 340.6 lb

## 2013-10-08 DIAGNOSIS — I83893 Varicose veins of bilateral lower extremities with other complications: Secondary | ICD-10-CM

## 2013-10-08 NOTE — Progress Notes (Signed)
[facility] [place] HISTORY AND PHYSICAL    Angelica Chessman, MD  HPI: This is a 43 y.o. male who is s/p left greater saphenous vein ablation by Dr. Kellie Simmering.  His only complaint today is his leg is still painful.  Otherwise, he states he is still wearing his compression on his LLE.  He returns for follow up today.  Past Medical History  Diagnosis Date  . Varicose veins    Past Surgical History  Procedure Laterality Date  . Cholecystectomy    . Endovenous ablation saphenous vein w/ laser Left 09-30-2013    left greater saphenous vein    No Known Allergies  Current Outpatient Prescriptions  Medication Sig Dispense Refill  . acetaminophen-codeine (TYLENOL #3) 300-30 MG per tablet Take 1 tablet by mouth every 4 (four) hours as needed for moderate pain.  90 tablet  0  . cephALEXin (KEFLEX) 500 MG capsule Take 1 capsule (500 mg total) by mouth 4 (four) times daily.  28 capsule  0  . diphenhydrAMINE (BENADRYL) 25 MG tablet Take 25 mg by mouth every 6 (six) hours as needed. For allergies      . FLUoxetine (PROZAC) 20 MG tablet Take 2 tablets (40 mg total) by mouth daily.  60 tablet  3  . fluticasone (FLONASE) 50 MCG/ACT nasal spray Place 2 sprays into both nostrils daily.  16 g  6  . furosemide (LASIX) 20 MG tablet Take 1 tablet (20 mg total) by mouth daily.  30 tablet  1  . HYDROcodone-acetaminophen (NORCO/VICODIN) 5-325 MG per tablet Take 1 tablet by mouth every 4 (four) hours as needed for pain.  15 tablet  0  . ibuprofen (ADVIL,MOTRIN) 200 MG tablet Take 400 mg by mouth every 6 (six) hours as needed.      . mupirocin cream (BACTROBAN) 2 % Apply topically 3 (three) times daily.  15 g  0  . neomycin-bacitracin-polymyxin (NEOSPORIN) OINT Apply 1 application topically as needed. For place under arm  2 Tube  3  . sulfamethoxazole-trimethoprim (BACTRIM DS) 800-160 MG per tablet Take 1 tablet by mouth 2 (two) times daily.  20 tablet  0  . sulfamethoxazole-trimethoprim  (BACTRIM DS) 800-160 MG per tablet Take 1 tablet by mouth 2 (two) times daily.  20 tablet  0  . traMADol (ULTRAM) 50 MG tablet Take 1 tablet (50 mg total) by mouth every 8 (eight) hours as needed.  45 tablet  0  . [DISCONTINUED] albuterol (PROVENTIL HFA;VENTOLIN HFA) 108 (90 BASE) MCG/ACT inhaler Inhale 2 puffs into the lungs every 6 (six) hours as needed for wheezing.  1 Inhaler  2   No current facility-administered medications for this visit.    Family History  Problem Relation Age of Onset  . Varicose Veins Mother   . Heart disease Mother   . Diabetes Mother   . Arthritis Mother   . Hypertension Mother   . Stroke Father   . Heart disease Father     History   Social History  . Marital Status: Single    Spouse Name: N/A    Number of Children: N/A  . Years of Education: N/A   Occupational History  . Not on file.   Social History Main Topics  . Smoking status: Current Every Day Smoker -- 0.15 packs/day  . Smokeless tobacco: Never Used  . Alcohol Use: No  . Drug Use: No  . Sexual Activity: Not on file   Other Topics Concern  . Not on file  Social History Narrative  . No narrative on file     ROS: [x]  Positive   [ ]  Negative   [x ] All sytems reviewed and are negative  Cardiovascular: []  chest pain/pressure []  palpitations []  SOB lying flat []  DOE []  pain in legs while walking []  pain in feet when lying flat []  hx of DVT []  hx of phlebitis []  swelling in legs []  varicose veins  Pulmonary: []  productive cough []  asthma []  wheezing  Neurologic: []  weakness in []  arms []  legs []  numbness in []  arms []  legs [] difficulty speaking or slurred speech []  temporary loss of vision in one eye []  dizziness  Hematologic: []  bleeding problems []  problems with blood clotting easily  GI []  vomiting blood []  blood in stool  GU: []  burning with urination []  blood in urine  Psychiatric: []  hx of major depression  Integumentary: []  rashes []   ulcers  Constitutional: []  fever []  chills   PHYSICAL EXAMINATION:  Filed Vitals:   10/08/13 1532  BP: 145/94  Pulse: 87  Resp: 18   Body mass index is 44.95 kg/(m^2).  General:  WDWN in NAD Gait: Normal HENT: WNL, normocephalic Eyes: Pupils equal Pulmonary: normal non-labored breathing , without Rales, rhonchi,  wheezing Skin: without rashes, without ulcers; excoriation of anterior shin at the foot. Vascular Exam/Pulses:ecchymosis upper left thigh and tenderness to palpation Extremities: without ischemic changes, without Gangrene , without cellulitis; without open wounds;  Musculoskeletal: no muscle wasting or atrophy  Neurologic: A&O X 3; Appropriate Affect ; SENSATION: normal; MOTOR FUNCTION:  moving all extremities equally. Speech is fluent/normal   Non-Invasive Vascular Imaging:  Lower extremity venous duplex evaluation-post ablation 10/08/13. Impression: Successful ablation of the left great saphenous vein evidence of deep vein involvement    ASSESSMENT: 43 y.o. male who is s/p laser ablation of left great saphenous vein performed under local anesthesia for venous hypertension with hx of venous stasis ulcer, which was successful.  PLAN: -pt doing well with but still with pain throughout his LLE.  Continue thigh high compression for one week and then can continue with compression to the knee thereafter until venous ulcer heals.  He will f/u with [place] as needed.   Leontine Locket, PA-C [facility] [phone]  Clinic MD:  Pt seen and examined in conjunction with Dr. Donnetta Hutching

## 2013-10-14 ENCOUNTER — Other Ambulatory Visit: Payer: Self-pay | Admitting: Vascular Surgery

## 2013-10-21 ENCOUNTER — Ambulatory Visit: Payer: Self-pay | Admitting: Internal Medicine

## 2013-10-21 ENCOUNTER — Encounter (HOSPITAL_COMMUNITY): Payer: Self-pay

## 2013-10-21 ENCOUNTER — Ambulatory Visit: Payer: Self-pay | Admitting: Vascular Surgery

## 2013-10-31 ENCOUNTER — Ambulatory Visit: Payer: No Typology Code available for payment source | Admitting: Internal Medicine

## 2013-11-05 ENCOUNTER — Ambulatory Visit: Payer: No Typology Code available for payment source | Attending: Internal Medicine | Admitting: Internal Medicine

## 2013-11-05 ENCOUNTER — Encounter: Payer: Self-pay | Admitting: Internal Medicine

## 2013-11-05 VITALS — BP 124/86 | HR 81 | Temp 98.9°F | Resp 16 | Ht 74.0 in | Wt 339.8 lb

## 2013-11-05 DIAGNOSIS — L97929 Non-pressure chronic ulcer of unspecified part of left lower leg with unspecified severity: Secondary | ICD-10-CM

## 2013-11-05 DIAGNOSIS — M79609 Pain in unspecified limb: Secondary | ICD-10-CM

## 2013-11-05 DIAGNOSIS — Z79899 Other long term (current) drug therapy: Secondary | ICD-10-CM | POA: Insufficient documentation

## 2013-11-05 DIAGNOSIS — F17200 Nicotine dependence, unspecified, uncomplicated: Secondary | ICD-10-CM | POA: Insufficient documentation

## 2013-11-05 DIAGNOSIS — I83009 Varicose veins of unspecified lower extremity with ulcer of unspecified site: Secondary | ICD-10-CM | POA: Insufficient documentation

## 2013-11-05 DIAGNOSIS — I83029 Varicose veins of left lower extremity with ulcer of unspecified site: Secondary | ICD-10-CM

## 2013-11-05 DIAGNOSIS — M79606 Pain in leg, unspecified: Secondary | ICD-10-CM

## 2013-11-05 DIAGNOSIS — L97909 Non-pressure chronic ulcer of unspecified part of unspecified lower leg with unspecified severity: Principal | ICD-10-CM | POA: Insufficient documentation

## 2013-11-05 MED ORDER — ACETAMINOPHEN-CODEINE #3 300-30 MG PO TABS: 1.0000 | ORAL_TABLET | Freq: Three times a day (TID) | ORAL | Status: DC | PRN

## 2013-11-05 NOTE — Progress Notes (Signed)
Patient ID: Erik Howe, male   DOB: [birth year], 43 y.o.   MRN: [id number]  CC: ankle/leg pain  HPI: Patient reports the same ongoing leg pain since September of last year.  He reports that he saw a blister in September which progressively got worse and was referred to wound care in October. He should eventually and has increased pain and had vein ablation surgery.  He reports that since surgery last month he has been wearing compression hose daily. He notes that pain is throbbing from his knee down to his foot feels it is worse.  Patient reports standing makes the pain worse and sitting helps relieve the pain. Patient reports the old venous stasis ulcer is very pruritic.    No Known Allergies Past Medical History  Diagnosis Date  . Varicose veins    Current Outpatient Prescriptions on File Prior to Visit  Medication Sig Dispense Refill  . acetaminophen-codeine (TYLENOL #3) 300-30 MG per tablet Take 1 tablet by mouth every 4 (four) hours as needed for moderate pain.  90 tablet  0  . diphenhydrAMINE (BENADRYL) 25 MG tablet Take 25 mg by mouth every 6 (six) hours as needed. For allergies      . FLUoxetine (PROZAC) 20 MG tablet Take 2 tablets (40 mg total) by mouth daily.  60 tablet  3  . fluticasone (FLONASE) 50 MCG/ACT nasal spray Place 2 sprays into both nostrils daily.  16 g  6  . ibuprofen (ADVIL,MOTRIN) 200 MG tablet Take 400 mg by mouth every 6 (six) hours as needed.      . cephALEXin (KEFLEX) 500 MG capsule Take 1 capsule (500 mg total) by mouth 4 (four) times daily.  28 capsule  0  . furosemide (LASIX) 20 MG tablet Take 1 tablet (20 mg total) by mouth daily.  30 tablet  1  . HYDROcodone-acetaminophen (NORCO/VICODIN) 5-325 MG per tablet Take 1 tablet by mouth every 4 (four) hours as needed for pain.  15 tablet  0  . mupirocin cream (BACTROBAN) 2 % Apply topically 3 (three) times daily.  15 g  0  . neomycin-bacitracin-polymyxin (NEOSPORIN) OINT Apply 1 application topically as needed. For  place under arm  2 Tube  3  . sulfamethoxazole-trimethoprim (BACTRIM DS) 800-160 MG per tablet Take 1 tablet by mouth 2 (two) times daily.  20 tablet  0  . sulfamethoxazole-trimethoprim (BACTRIM DS) 800-160 MG per tablet Take 1 tablet by mouth 2 (two) times daily.  20 tablet  0  . traMADol (ULTRAM) 50 MG tablet Take 1 tablet (50 mg total) by mouth every 8 (eight) hours as needed.  45 tablet  0  . [DISCONTINUED] albuterol (PROVENTIL HFA;VENTOLIN HFA) 108 (90 BASE) MCG/ACT inhaler Inhale 2 puffs into the lungs every 6 (six) hours as needed for wheezing.  1 Inhaler  2   No current facility-administered medications on file prior to visit.   Family History  Problem Relation Age of Onset  . Varicose Veins Mother   . Heart disease Mother   . Diabetes Mother   . Arthritis Mother   . Hypertension Mother   . Stroke Father   . Heart disease Father    History   Social History  . Marital Status: Single    Spouse Name: N/A    Number of Children: N/A  . Years of Education: N/A   Occupational History  . Not on file.   Social History Main Topics  . Smoking status: Current Every Day Smoker -- 0.15 packs/day  .  Smokeless tobacco: Never Used  . Alcohol Use: No  . Drug Use: No  . Sexual Activity: Not on file   Other Topics Concern  . Not on file   Social History Narrative  . No narrative on file   Review of Systems  Constitutional: Negative for fever, chills and malaise/fatigue.  Respiratory: Negative.   Cardiovascular: Positive for leg swelling (LLE, erythema). Negative for chest pain, palpitations and claudication.  Skin:       LLE statis ulcer. Wears compression hose since vein ablation surgery     Objective:   Filed Vitals:   11/05/13 1529  BP: 124/86  Pulse: 81  Temp: 98.9 F (37.2 C)  Resp: 16   Physical Exam  Vitals reviewed. Cardiovascular: Normal rate, regular rhythm, normal heart sounds and intact distal pulses.   Pulmonary/Chest: Effort normal and breath sounds  normal.  Abdominal: Soft. Bowel sounds are normal. He exhibits no distension.  Musculoskeletal: Normal range of motion. He exhibits edema and tenderness.       Left ankle: He exhibits swelling.       Feet:  LLE erythematous and tender to touch. Healed venous ulcer on lateral ankle. Very dry skin      Lab Results  Component Value Date   WBC 10.4 03/18/2013   HGB 16.8 03/18/2013   HCT 46.0 03/18/2013   MCV 91.1 03/18/2013   PLT 232 03/18/2013   Lab Results  Component Value Date   CREATININE 0.75 03/18/2013   BUN 6 03/18/2013   NA 138 03/18/2013   K 3.6 03/18/2013   CL 100 03/18/2013   CO2 27 03/18/2013    Lab Results  Component Value Date   HGBA1C 4.9 07/22/2013   Lipid Panel  No results found for this basename: chol, trig, hdl, cholhdl, vldl, ldlcalc       Assessment and plan:   [place] was seen today for follow-up.  Diagnoses and associated orders for this visit:  Leg pain - acetaminophen-codeine (TYLENOL #3) 300-30 MG per tablet; Take 1 tablet by mouth every 8 (eight) hours as needed for moderate pain.  Stasis ulcer of left lower extremity Explained to patient signs and symptoms of infection and when to return to clinic for evaluation.  Return if symptoms worsen or fail to improve.      Chari Manning, NP-C [facility] [phone] 11/06/2013, 2:57 PM

## 2013-11-05 NOTE — Progress Notes (Signed)
Patient here to F/U for left-sided leg, ankle and foot pain. Rates 6/10 at present. Referred to social worker for PHQ-9 score of 15. Provider aware.

## 2013-11-05 NOTE — Patient Instructions (Addendum)
Stasis Ulcer °Stasis ulcers occur in the legs when the circulation is damaged. An ulcer may look like a small hole in the skin.  °CAUSES °Stasis ulcers occur because your veins do not work properly. Veins have valves that help the blood return to the heart. If these valves do not work right, blood flows backwards and backs up into the veins near the skin. This condition causes the veins to become larger because of increased pressure and may lead to a stasis ulcer. °SYMPTOMS  °· Shallow (superficial) sore on the leg. °· Clear drainage or weeping from the sore. °· Leg pain or a feeling of heaviness. This may be worse at the end of the day. °· Leg swelling. °· Skin color changes. °DIAGNOSIS  °Your caregiver will make a diagnosis by examining your leg. Your caregiver may order tests such as an ultrasound or other studies to evaluate the blood flow of the leg. °HOME CARE INSTRUCTIONS  °· Do not stand or sit in one position for long periods of time. Do not sit with your legs crossed. Rest with your legs raised during the day. If possible, it is best if you can elevate your legs above your heart for 30 minutes, 3 to 4 times a day. °· Wear elastic stockings or support hose. Do not wear other tight encircling garments around legs, pelvis, or waist. This causes increased pressure in your veins. If your caregiver has applied compressive medicated wraps, use them as instructed. °· Walk as much as possible to increase blood flow. If you are taking long rides in a car or plane, take a break to walk around every 2 hours. If not already on aspirin, take a baby aspirin before long trips unless you have medical reasons that prohibit this. °· Raise the foot of your bed at night with 2-inch blocks if approved by your caregiver. This may not be desirable if you have heart failure or breathing problems. °· If you get a cut in the skin over the vein and the vein bleeds, lie down with your leg raised and gently clean the area with a clean  cloth. Apply pressure on the cut until the bleeding stops. Then place a dressing on the cut. See your caregiver if it continues to bleed or needs stitches. Also, see your caregiver if you develop an infection. Signs of an infection include a fever, redness, increased pain, and drainage of pus. °· If your caregiver has given you a follow-up appointment, it is very important to keep that appointment. Not keeping the appointment could result in a chronic or permanent injury, pain, and disability. If there is any problem keeping the appointment, call your caregiver for assistance. °SEEK IMMEDIATE MEDICAL CARE IF: °· The ulcer area starts to break down. °· You have pain, redness, tenderness, pus, or hard swelling in your leg over a vein or near the ulcer. °· Your leg pain is uncomfortable. °· You develop an unexplained fever. °· You develop chest pain or shortness of breath. °Document Released: 03/01/2001 Document Revised: 08/29/2011 Document Reviewed: 09/26/2010 °ExitCare® Patient Information ©2014 [employer]. ° °

## 2013-11-18 ENCOUNTER — Telehealth: Payer: Self-pay | Admitting: Internal Medicine

## 2013-11-18 NOTE — Telephone Encounter (Signed)
Seth Bake from [facility] calling to obtain diagnosis code to put on file since pt is received refills for pain meds. Please f/u with pharmacy.

## 2013-11-19 NOTE — Telephone Encounter (Signed)
Spoke with [facility]- diagnostic codes  Given for Ultram and Tylenol #3

## 2013-12-18 ENCOUNTER — Encounter: Payer: Self-pay | Admitting: Internal Medicine

## 2013-12-18 ENCOUNTER — Ambulatory Visit: Payer: No Typology Code available for payment source | Attending: Internal Medicine | Admitting: Internal Medicine

## 2013-12-18 VITALS — BP 130/83 | HR 92 | Temp 98.5°F | Resp 14 | Ht 74.0 in | Wt 336.0 lb

## 2013-12-18 DIAGNOSIS — L309 Dermatitis, unspecified: Secondary | ICD-10-CM

## 2013-12-18 DIAGNOSIS — M79605 Pain in left leg: Secondary | ICD-10-CM

## 2013-12-18 DIAGNOSIS — L259 Unspecified contact dermatitis, unspecified cause: Secondary | ICD-10-CM | POA: Insufficient documentation

## 2013-12-18 DIAGNOSIS — F17200 Nicotine dependence, unspecified, uncomplicated: Secondary | ICD-10-CM | POA: Insufficient documentation

## 2013-12-18 DIAGNOSIS — R21 Rash and other nonspecific skin eruption: Secondary | ICD-10-CM | POA: Insufficient documentation

## 2013-12-18 DIAGNOSIS — M79609 Pain in unspecified limb: Secondary | ICD-10-CM | POA: Insufficient documentation

## 2013-12-18 MED ORDER — TRIAMCINOLONE ACETONIDE 0.1 % EX CREA: 1.0000 "application " | TOPICAL_CREAM | Freq: Two times a day (BID) | CUTANEOUS | Status: DC

## 2013-12-18 MED ORDER — HYDROXYZINE HCL 25 MG PO TABS: 25.0000 mg | ORAL_TABLET | Freq: Three times a day (TID) | ORAL | Status: DC | PRN

## 2013-12-18 MED ORDER — ACETAMINOPHEN-CODEINE #3 300-30 MG PO TABS: 1.0000 | ORAL_TABLET | Freq: Three times a day (TID) | ORAL | Status: DC | PRN

## 2013-12-18 NOTE — Progress Notes (Signed)
Patient presents for F/U left leg pain, rates 6/10 at present. Worsens with standing. C/O itchy generalized rash for 1 month. No relief from benedryl

## 2013-12-18 NOTE — Patient Instructions (Signed)
Eczema Eczema, also called atopic dermatitis, is a skin disorder that causes inflammation of the skin. It causes a red rash and dry, scaly skin. The skin becomes very itchy. Eczema is generally worse during the cooler winter months and often improves with the warmth of summer. Eczema usually starts showing signs in infancy. Some children outgrow eczema, but it may last through adulthood.  CAUSES  The exact cause of eczema is not known, but it appears to run in families. People with eczema often have a family history of eczema, allergies, asthma, or hay fever. Eczema is not contagious. Flare-ups of the condition may be caused by:   Contact with something you are sensitive or allergic to.   Stress. SIGNS AND SYMPTOMS  Dry, scaly skin.   Red, itchy rash.   Itchiness. This may occur before the skin rash and may be very intense.  DIAGNOSIS  The diagnosis of eczema is usually made based on symptoms and medical history. TREATMENT  Eczema cannot be cured, but symptoms usually can be controlled with treatment and other strategies. A treatment plan might include:  Controlling the itching and scratching.   Use over-the-counter antihistamines as directed for itching. This is especially useful at night when the itching tends to be worse.   Use over-the-counter steroid creams as directed for itching.   Avoid scratching. Scratching makes the rash and itching worse. It may also result in a skin infection (impetigo) due to a break in the skin caused by scratching.   Keeping the skin well moisturized with creams every day. This will seal in moisture and help prevent dryness. Lotions that contain alcohol and water should be avoided because they can dry the skin.   Limiting exposure to things that you are sensitive or allergic to (allergens).   Recognizing situations that cause stress.   Developing a plan to manage stress.  HOME CARE INSTRUCTIONS   Only take over-the-counter or  prescription medicines as directed by your health care provider.   Do not use anything on the skin without checking with your health care provider.   Keep baths or showers short (5 minutes) in warm (not hot) water. Use mild cleansers for bathing. These should be unscented. You may add nonperfumed bath oil to the bath water. It is best to avoid soap and bubble bath.   Immediately after a bath or shower, when the skin is still damp, apply a moisturizing ointment to the entire body. This ointment should be a petroleum ointment. This will seal in moisture and help prevent dryness. The thicker the ointment, the better. These should be unscented.   Keep fingernails cut short. Children with eczema may need to wear soft gloves or mittens at night after applying an ointment.   Dress in clothes made of cotton or cotton blends. Dress lightly, because heat increases itching.   A child with eczema should stay away from anyone with fever blisters or cold sores. The virus that causes fever blisters (herpes simplex) can cause a serious skin infection in children with eczema. SEEK MEDICAL CARE IF:   Your itching interferes with sleep.   Your rash gets worse or is not better within 1 week after starting treatment.   You see pus or soft yellow scabs in the rash area.   You have a fever.   You have a rash flare-up after contact with someone who has fever blisters.  Document Released: 06/03/2000 Document Revised: 03/27/2013 Document Reviewed: 01/07/2013 ExitCare Patient Information 2015 [employer]. This information   is not intended to replace advice given to you by your health care provider. Make sure you discuss any questions you have with your health care provider.  

## 2013-12-18 NOTE — Progress Notes (Signed)
Patient ID: Erik Howe, male   DOB: 12/07/1970, 43 y.o.   MRN: [id number]  CC: leg pain and rash  HPI:  Patient presents with generalized rash for over one month.  Patient reports that Erik Howe has had dry skin similar to this over two years but it has never been as bad as this occurrence.  Patient reports that Erik Howe has not been exposed to any new foods or chemicals.  Has tried OTC cortisone cream and benadryl for severe itching without much relief. Patient reports that Erik Howe is still being followed by vascular for circulation in left leg and stasis ulcer.  Patient reports that Erik Howe continues to elevate leg at night but continues to have some swelling and pain in left leg after standing.  No Known Allergies Past Medical History  Diagnosis Date  . Varicose veins   . Cellulitis 12/18/2012    left foot   Current Outpatient Prescriptions on File Prior to Visit  Medication Sig Dispense Refill  . acetaminophen-codeine (TYLENOL #3) 300-30 MG per tablet Take 1 tablet by mouth every 8 (eight) hours as needed for moderate pain.  90 tablet  0  . diphenhydrAMINE (BENADRYL) 25 MG tablet Take 25 mg by mouth every 6 (six) hours as needed. For allergies      . fluticasone (FLONASE) 50 MCG/ACT nasal spray Place 2 sprays into both nostrils daily.  16 g  6  . ibuprofen (ADVIL,MOTRIN) 200 MG tablet Take 400 mg by mouth every 6 (six) hours as needed.      . cephALEXin (KEFLEX) 500 MG capsule Take 1 capsule (500 mg total) by mouth 4 (four) times daily.  28 capsule  0  . FLUoxetine (PROZAC) 20 MG tablet Take 2 tablets (40 mg total) by mouth daily.  60 tablet  3  . furosemide (LASIX) 20 MG tablet Take 1 tablet (20 mg total) by mouth daily.  30 tablet  1  . HYDROcodone-acetaminophen (NORCO/VICODIN) 5-325 MG per tablet Take 1 tablet by mouth every 4 (four) hours as needed for pain.  15 tablet  0  . mupirocin cream (BACTROBAN) 2 % Apply topically 3 (three) times daily.  15 g  0  . neomycin-bacitracin-polymyxin (NEOSPORIN) OINT Apply  1 application topically as needed. For place under arm  2 Tube  3  . sulfamethoxazole-trimethoprim (BACTRIM DS) 800-160 MG per tablet Take 1 tablet by mouth 2 (two) times daily.  20 tablet  0  . sulfamethoxazole-trimethoprim (BACTRIM DS) 800-160 MG per tablet Take 1 tablet by mouth 2 (two) times daily.  20 tablet  0  . traMADol (ULTRAM) 50 MG tablet Take 1 tablet (50 mg total) by mouth every 8 (eight) hours as needed.  45 tablet  0  . [DISCONTINUED] albuterol (PROVENTIL HFA;VENTOLIN HFA) 108 (90 BASE) MCG/ACT inhaler Inhale 2 puffs into the lungs every 6 (six) hours as needed for wheezing.  1 Inhaler  2   No current facility-administered medications on file prior to visit.   Family History  Problem Relation Age of Onset  . Varicose Veins Mother   . Heart disease Mother   . Diabetes Mother   . Arthritis Mother   . Hypertension Mother   . Stroke Father   . Heart disease Father    History   Social History  . Marital Status: Single    Spouse Name: N/A    Number of Children: N/A  . Years of Education: N/A   Occupational History  . Not on file.   Social History  Main Topics  . Smoking status: Current Every Day Smoker -- 0.15 packs/day  . Smokeless tobacco: Never Used  . Alcohol Use: No  . Drug Use: No  . Sexual Activity: Not on file   Other Topics Concern  . Not on file   Social History Narrative  . No narrative on file   Review of Systems  Constitutional: Negative.   Musculoskeletal:       Left leg pain   Skin: Positive for itching and rash (generalized).     Objective:   Filed Vitals:   12/18/13 1144  BP: 130/83  Pulse: 92  Temp: 98.5 F (36.9 C)  Resp: 14   Physical Exam  Cardiovascular: Normal rate, regular rhythm and normal heart sounds.   Pulmonary/Chest: Effort normal and breath sounds normal.  Abdominal: Soft. Bowel sounds are normal. Erik Howe exhibits no distension.  Musculoskeletal: Normal range of motion. Erik Howe exhibits edema (left leg). Erik Howe exhibits no  tenderness.  Left leg with very dry scaly skin. Patient has improved statis ulcer or left leg with severe varicose veins.  Neurological: Erik Howe is alert.  Skin: Skin is warm and dry. Rash (dry hyperpigemented areas in flexor surfave of arms and legs) noted. There is erythema.     Lab Results  Component Value Date   WBC 10.4 03/18/2013   HGB 16.8 03/18/2013   HCT 46.0 03/18/2013   MCV 91.1 03/18/2013   PLT 232 03/18/2013   Lab Results  Component Value Date   CREATININE 0.75 03/18/2013   BUN 6 03/18/2013   NA 138 03/18/2013   K 3.6 03/18/2013   CL 100 03/18/2013   CO2 27 03/18/2013    Lab Results  Component Value Date   HGBA1C 4.9 07/22/2013   Lipid Panel  No results found for this basename: chol, trig, hdl, cholhdl, vldl, ldlcalc       Assessment and plan:   Erik Howe was seen today for follow-up, leg pain and rash.  Diagnoses and associated orders for this visit:  Pain of left lower extremity - acetaminophen-codeine (TYLENOL #3) 300-30 MG per tablet; Take 1 tablet by mouth every 8 (eight) hours as needed for moderate pain.  Eczema - triamcinolone cream (KENALOG) 0.1 %; Apply 1 application topically 2 (two) times daily. - hydrOXYzine (ATARAX/VISTARIL) 25 MG tablet; Take 1 tablet (25 mg total) by mouth 3 (three) times daily as needed for itching. Educated patient to do short cool showers and use thick moisturizer cream right after patting skin dry TOBACCO USER Smoke cessation discussed  Return if symptoms worsen or fail to improve.       Chari Manning, NP-C [facility] [phone] 12/24/2013, 12:57 PM

## 2014-06-02 ENCOUNTER — Telehealth: Payer: Self-pay | Admitting: Internal Medicine

## 2014-06-02 NOTE — Telephone Encounter (Signed)
Pt.called to speak to nurse, pt. Believes he has shingles and would like to see the doctor. Please f/u with pt.

## 2014-06-03 ENCOUNTER — Encounter (HOSPITAL_COMMUNITY): Payer: Self-pay | Admitting: Emergency Medicine

## 2014-06-03 ENCOUNTER — Emergency Department (HOSPITAL_COMMUNITY)
Admission: EM | Admit: 2014-06-03 | Discharge: 2014-06-03 | Disposition: A | Discharge status: Home / Self Care | Payer: Self-pay | Attending: Emergency Medicine | Admitting: Emergency Medicine

## 2014-06-03 DIAGNOSIS — I872 Venous insufficiency (chronic) (peripheral): Secondary | ICD-10-CM | POA: Insufficient documentation

## 2014-06-03 DIAGNOSIS — L03116 Cellulitis of left lower limb: Secondary | ICD-10-CM | POA: Insufficient documentation

## 2014-06-03 DIAGNOSIS — Z720 Tobacco use: Secondary | ICD-10-CM | POA: Insufficient documentation

## 2014-06-03 DIAGNOSIS — Z792 Long term (current) use of antibiotics: Secondary | ICD-10-CM | POA: Insufficient documentation

## 2014-06-03 DIAGNOSIS — Z79899 Other long term (current) drug therapy: Secondary | ICD-10-CM | POA: Insufficient documentation

## 2014-06-03 DIAGNOSIS — Z7951 Long term (current) use of inhaled steroids: Secondary | ICD-10-CM | POA: Insufficient documentation

## 2014-06-03 DIAGNOSIS — L0390 Cellulitis, unspecified: Secondary | ICD-10-CM

## 2014-06-03 DIAGNOSIS — L309 Dermatitis, unspecified: Secondary | ICD-10-CM | POA: Insufficient documentation

## 2014-06-03 DIAGNOSIS — L03115 Cellulitis of right lower limb: Secondary | ICD-10-CM | POA: Insufficient documentation

## 2014-06-03 MED ORDER — HYDROXYZINE HCL 25 MG PO TABS: 25.0000 mg | ORAL_TABLET | Freq: Four times a day (QID) | ORAL | Status: DC

## 2014-06-03 MED ORDER — CEPHALEXIN 500 MG PO CAPS: 500.0000 mg | ORAL_CAPSULE | Freq: Four times a day (QID) | ORAL | Status: DC

## 2014-06-03 MED ORDER — PREDNISONE 20 MG PO TABS: ORAL_TABLET | ORAL | Status: DC

## 2014-06-03 MED ORDER — PREDNISONE 20 MG PO TABS
60.0000 mg | ORAL_TABLET | Freq: Once | ORAL | Status: AC
  Administered 2014-06-03: 60 mg via ORAL
  Filled 2014-06-03: qty 3

## 2014-06-03 MED ORDER — CEPHALEXIN 500 MG PO CAPS
500.0000 mg | ORAL_CAPSULE | Freq: Once | ORAL | Status: AC
  Administered 2014-06-03: 500 mg via ORAL
  Filled 2014-06-03: qty 1

## 2014-06-03 MED ORDER — HYDROCODONE-ACETAMINOPHEN 5-325 MG PO TABS: 2.0000 | ORAL_TABLET | ORAL | Status: DC | PRN

## 2014-06-03 NOTE — ED Notes (Signed)
Pt is c/o rash to various parts of his body that itch  Pt states the rash started on Thursday  Pt states he also has sores on his legs and feet that are painful

## 2014-06-03 NOTE — ED Notes (Signed)
Patient has cellulitis to lower extremities. Dry, flaky skin to forearms.

## 2014-06-03 NOTE — Discharge Instructions (Signed)
Keep areas of drainage and redness clean and dry. Elevate your legs when possible. Use a moisturizer of choice to thickened dry areas.  Eczema Eczema, also called atopic dermatitis, is a skin disorder that causes inflammation of the skin. It causes a red rash and dry, scaly skin. The skin becomes very itchy. Eczema is generally worse during the cooler winter months and often improves with the warmth of summer. Eczema usually starts showing signs in infancy. Some children outgrow eczema, but it may last through adulthood.  CAUSES  The exact cause of eczema is not known, but it appears to run in families. People with eczema often have a family history of eczema, allergies, asthma, or hay fever. Eczema is not contagious. Flare-ups of the condition may be caused by:   Contact with something you are sensitive or allergic to.   Stress. SIGNS AND SYMPTOMS  Dry, scaly skin.   Red, itchy rash.   Itchiness. This may occur before the skin rash and may be very intense.  DIAGNOSIS  The diagnosis of eczema is usually made based on symptoms and medical history. TREATMENT  Eczema cannot be cured, but symptoms usually can be controlled with treatment and other strategies. A treatment plan might include:  Controlling the itching and scratching.   Use over-the-counter antihistamines as directed for itching. This is especially useful at night when the itching tends to be worse.   Use over-the-counter steroid creams as directed for itching.   Avoid scratching. Scratching makes the rash and itching worse. It may also result in a skin infection (impetigo) due to a break in the skin caused by scratching.   Keeping the skin well moisturized with creams every day. This will seal in moisture and help prevent dryness. Lotions that contain alcohol and water should be avoided because they can dry the skin.   Limiting exposure to things that you are sensitive or allergic to (allergens).   Recognizing  situations that cause stress.   Developing a plan to manage stress.  HOME CARE INSTRUCTIONS   Only take over-the-counter or prescription medicines as directed by your health care provider.   Do not use anything on the skin without checking with your health care provider.   Keep baths or showers short (5 minutes) in warm (not hot) water. Use mild cleansers for bathing. These should be unscented. You may add nonperfumed bath oil to the bath water. It is best to avoid soap and bubble bath.   Immediately after a bath or shower, when the skin is still damp, apply a moisturizing ointment to the entire body. This ointment should be a petroleum ointment. This will seal in moisture and help prevent dryness. The thicker the ointment, the better. These should be unscented.   Keep fingernails cut short. Children with eczema may need to wear soft gloves or mittens at night after applying an ointment.   Dress in clothes made of cotton or cotton blends. Dress lightly, because heat increases itching.   A child with eczema should stay away from anyone with fever blisters or cold sores. The virus that causes fever blisters (herpes simplex) can cause a serious skin infection in children with eczema. SEEK MEDICAL CARE IF:   Your itching interferes with sleep.   Your rash gets worse or is not better within 1 week after starting treatment.   You see pus or soft yellow scabs in the rash area.   You have a fever.   You have a rash flare-up after contact  with someone who has fever blisters.  Document Released: 06/03/2000 Document Revised: 03/27/2013 Document Reviewed: 01/07/2013 [facility] Patient Information 2015 [place], [place]. This information is not intended to replace advice given to you by your health care provider. Make sure you discuss any questions you have with your health care provider.  Stasis Dermatitis Stasis dermatitis occurs when veins lose the ability to pump blood back to the  heart (poor venous circulation). Dry skin commonly occurs along with poor venous circulation. Stasis dermatitis appears as red, scaly, itchy patches on the legs. A yellowish or light brown discoloration is also present. Due to scratching or other injury, these patches can become an ulcer. This ulcer may remain for long periods of time. The ulcer can also become infected. Swelling of the legs is often present with stasis dermatitis. If the leg is swollen, this increases the risk of infection and further damage to the skin. Sometimes, intense itching, tingling, and burning occurs before signs of stasis dermatitis appear. You may find yourself scratching the insides of your ankles or rubbing your ankles together before the rash appears. After healing, there are often brown spots on the affected skin. Treatment includes resting and elevating the affected leg above the level of the heart, if possible. Cortisone creams and ointments applied to the skin (topically) may be needed, as well as medicine to reduce swelling in the legs (diuretics). Compression stockings or an elastic wrap may also be needed to reduce swelling. If there is an infection, antibiotic medicines may also be used. Your skin may react to topical medicine (sensitization). Your skin may react to ingredients in wool wax alcohols, fragrances, and even topical corticosteroids. Burow's solution wet packs applied for 30 minutes, 3 times daily, will help the weepy rash. Stop using the packs before your skin gets too dry. You can also use a mixture of 3 parts white vinegar to 1 quart water. Grease your legs daily with ointments, such as petroleum jelly, to fight dryness. Avoid scratching or injuring the affected area. Call your caregiver right away if your rash gets worse, if an ulcer forms, or if you have a fever or other severe symptoms. Document Released: 07/14/2004 Document Revised: 08/29/2011 Document Reviewed: 10/26/2009 [facility] Patient Information  2015 [place], [place]. This information is not intended to replace advice given to you by your health care provider. Make sure you discuss any questions you have with your health care provider.  Cellulitis Cellulitis is an infection of the skin and the tissue beneath it. The infected area is usually red and tender. Cellulitis occurs most often in the arms and lower legs.  CAUSES  Cellulitis is caused by bacteria that enter the skin through cracks or cuts in the skin. The most common types of bacteria that cause cellulitis are staphylococci and streptococci. SIGNS AND SYMPTOMS   Redness and warmth.  Swelling.  Tenderness or pain.  Fever. DIAGNOSIS  Your health care provider can usually determine what is wrong based on a physical exam. Blood tests may also be done. TREATMENT  Treatment usually involves taking an antibiotic medicine. HOME CARE INSTRUCTIONS   Take your antibiotic medicine as directed by your health care provider. Finish the antibiotic even if you start to feel better.  Keep the infected arm or leg elevated to reduce swelling.  Apply a warm cloth to the affected area up to 4 times per day to relieve pain.  Take medicines only as directed by your health care provider.  Keep all follow-up visits as directed  by your health care provider. SEEK MEDICAL CARE IF:   You notice red streaks coming from the infected area.  Your red area gets larger or turns dark in color.  Your bone or joint underneath the infected area becomes painful after the skin has healed.  Your infection returns in the same area or another area.  You notice a swollen bump in the infected area.  You develop new symptoms.  You have a fever. SEEK IMMEDIATE MEDICAL CARE IF:   You feel very sleepy.  You develop vomiting or diarrhea.  You have a general ill feeling (malaise) with muscle aches and pains. MAKE SURE YOU:   Understand these instructions.  Will watch your condition.  Will get help  right away if you are not doing well or get worse. Document Released: 03/16/2005 Document Revised: 10/21/2013 Document Reviewed: 08/22/2011 [facility] Patient Information 2015 [place], [place]. This information is not intended to replace advice given to you by your health care provider. Make sure you discuss any questions you have with your health care provider.

## 2014-06-07 NOTE — ED Provider Notes (Signed)
CSN: [id number]     Arrival date & time 06/03/14  0454 History   First MD Initiated Contact with Patient 06/03/14 0505     Chief Complaint  Patient presents with  . Rash      HPI  She presents for evaluation of her rash. Has a diffuse rash. History of eczema. Some areas on his lower legs has become more red and sore over the last few days and presents for evaluation here. No fevers or chills. No spreading erythema or lymphangitis.  Past Medical History  Diagnosis Date  . Varicose veins   . Cellulitis 12/18/2012    left foot   Past Surgical History  Procedure Laterality Date  . Cholecystectomy    . Endovenous ablation saphenous vein w/ laser Left 09-30-2013    left greater saphenous vein   Family History  Problem Relation Age of Onset  . Varicose Veins Mother   . Heart disease Mother   . Diabetes Mother   . Arthritis Mother   . Hypertension Mother   . Stroke Father   . Heart disease Father    History  Substance Use Topics  . Smoking status: Current Every Day Smoker -- 0.15 packs/day    Types: Cigarettes  . Smokeless tobacco: Never Used  . Alcohol Use: No    Review of Systems  Constitutional: Negative for fever, chills, diaphoresis, appetite change and fatigue.  HENT: Negative for mouth sores, sore throat and trouble swallowing.   Eyes: Negative for visual disturbance.  Respiratory: Negative for cough, chest tightness, shortness of breath and wheezing.   Cardiovascular: Negative for chest pain.  Gastrointestinal: Negative for nausea, vomiting, abdominal pain, diarrhea and abdominal distention.  Endocrine: Negative for polydipsia, polyphagia and polyuria.  Genitourinary: Negative for dysuria, frequency and hematuria.  Musculoskeletal: Negative for gait problem.  Skin: Positive for color change and rash. Negative for pallor.  Neurological: Negative for dizziness, syncope, light-headedness and headaches.  Hematological: Does not bruise/bleed easily.   Psychiatric/Behavioral: Negative for behavioral problems and confusion.      Allergies  Review of patient's allergies indicates no known allergies.  Home Medications   Prior to Admission medications   Medication Sig Start Date End Date Taking? Authorizing Provider  acetaminophen-codeine (TYLENOL #3) 300-30 MG per tablet Take 1 tablet by mouth every 8 (eight) hours as needed for moderate pain. 12/18/13   Lance Bosch, NP  cephALEXin (KEFLEX) 500 MG capsule Take 1 capsule (500 mg total) by mouth 4 (four) times daily. 03/28/13   Tresa Garter, MD  cephALEXin (KEFLEX) 500 MG capsule Take 1 capsule (500 mg total) by mouth 4 (four) times daily. 06/03/14   Tanna Furry, MD  diphenhydrAMINE (BENADRYL) 25 MG tablet Take 25 mg by mouth every 6 (six) hours as needed. For allergies    Historical Provider, MD  FLUoxetine (PROZAC) 20 MG tablet Take 2 tablets (40 mg total) by mouth daily. 07/22/13   Tresa Garter, MD  fluticasone (FLONASE) 50 MCG/ACT nasal spray Place 2 sprays into both nostrils daily. 09/11/13   Lorayne Marek, MD  furosemide (LASIX) 20 MG tablet Take 1 tablet (20 mg total) by mouth daily. 03/28/13   Tresa Garter, MD  HYDROcodone-acetaminophen (NORCO/VICODIN) 5-325 MG per tablet Take 2 tablets by mouth every 4 (four) hours as needed. 06/03/14   Tanna Furry, MD  hydrOXYzine (ATARAX/VISTARIL) 25 MG tablet Take 1 tablet (25 mg total) by mouth every 6 (six) hours. 06/03/14   Tanna Furry, MD  ibuprofen (ADVIL,MOTRIN) 200  MG tablet Take 400 mg by mouth every 6 (six) hours as needed.    Historical Provider, MD  mupirocin cream (BACTROBAN) 2 % Apply topically 3 (three) times daily. 03/28/13   Tresa Garter, MD  neomycin-bacitracin-polymyxin (NEOSPORIN) OINT Apply 1 application topically as needed. For place under arm 04/25/13   Tresa Garter, MD  predniSONE (DELTASONE) 20 MG tablet 1 po bid x 7 days, then 1 po q day x 7 days 06/03/14   Tanna Furry, MD   sulfamethoxazole-trimethoprim (BACTRIM DS) 800-160 MG per tablet Take 1 tablet by mouth 2 (two) times daily. 07/22/13   Tresa Garter, MD  sulfamethoxazole-trimethoprim (BACTRIM DS) 800-160 MG per tablet Take 1 tablet by mouth 2 (two) times daily. 09/11/13   Lorayne Marek, MD  traMADol (ULTRAM) 50 MG tablet Take 1 tablet (50 mg total) by mouth every 8 (eight) hours as needed. 04/25/13   Tresa Garter, MD  triamcinolone cream (KENALOG) 0.1 % Apply 1 application topically 2 (two) times daily. 12/18/13   Lance Bosch, NP   BP 137/86 mmHg  Pulse 94  Temp(Src) 98.4 F (36.9 C) (Oral)  Resp 18  Ht 6\' 2"  (1.88 m)  Wt 350 lb (158.759 kg)  BMI 44.92 kg/m2  SpO2 97% Physical Exam  Constitutional: He is oriented to person, place, and time. He appears well-developed and well-nourished. No distress.  HENT:  Head: Normocephalic.  Eyes: Conjunctivae are normal. Pupils are equal, round, and reactive to light. No scleral icterus.  Neck: Normal range of motion. Neck supple. No thyromegaly present.  Cardiovascular: Normal rate and regular rhythm.  Exam reveals no gallop and no friction rub.   No murmur heard. Pulmonary/Chest: Effort normal and breath sounds normal. No respiratory distress. He has no wheezes. He has no rales.  Abdominal: Soft. Bowel sounds are normal. He exhibits no distension. There is no tenderness. There is no rebound.  Musculoskeletal: Normal range of motion.  Neurological: He is alert and oriented to person, place, and time.  Skin: Skin is warm and dry. No rash noted.  Multiple areas of diffuse thickened scaly rash. Flexor and extensor surfaces. Chest and back. Extremities. Several one similar areas of erythema and wound with patient has excoriated the skin in the areas of eczema.  Psychiatric: He has a normal mood and affect. His behavior is normal.    ED Course  Procedures (including critical care time) Labs Review Labs Reviewed - No data to display  Imaging  Review No results found.   EKG Interpretation None      MDM   Final diagnoses:  Eczema  Venous stasis dermatitis of both lower extremities  Cellulitis, unspecified cellulitis site, unspecified extremity site, unspecified laterality    Diffuse eczema. Multiple areas of lichenified skin. Some areas of wound infection in his lower legs. Plan is by mouth steroids, by mouth antibiotics. Primary care follow-up.    Tanna Furry, MD 06/07/14 1040

## 2014-07-07 ENCOUNTER — Ambulatory Visit: Payer: Self-pay | Attending: Internal Medicine | Admitting: Internal Medicine

## 2014-07-07 ENCOUNTER — Encounter: Payer: Self-pay | Admitting: Internal Medicine

## 2014-07-07 VITALS — BP 144/90 | HR 97 | Temp 98.0°F | Resp 16 | Wt 344.6 lb

## 2014-07-07 DIAGNOSIS — Z7951 Long term (current) use of inhaled steroids: Secondary | ICD-10-CM | POA: Insufficient documentation

## 2014-07-07 DIAGNOSIS — F17210 Nicotine dependence, cigarettes, uncomplicated: Secondary | ICD-10-CM | POA: Insufficient documentation

## 2014-07-07 DIAGNOSIS — M79605 Pain in left leg: Secondary | ICD-10-CM | POA: Insufficient documentation

## 2014-07-07 DIAGNOSIS — I8312 Varicose veins of left lower extremity with inflammation: Secondary | ICD-10-CM | POA: Insufficient documentation

## 2014-07-07 DIAGNOSIS — Z792 Long term (current) use of antibiotics: Secondary | ICD-10-CM | POA: Insufficient documentation

## 2014-07-07 DIAGNOSIS — L309 Dermatitis, unspecified: Secondary | ICD-10-CM

## 2014-07-07 DIAGNOSIS — I872 Venous insufficiency (chronic) (peripheral): Secondary | ICD-10-CM | POA: Insufficient documentation

## 2014-07-07 DIAGNOSIS — Z9049 Acquired absence of other specified parts of digestive tract: Secondary | ICD-10-CM | POA: Insufficient documentation

## 2014-07-07 DIAGNOSIS — I8311 Varicose veins of right lower extremity with inflammation: Secondary | ICD-10-CM

## 2014-07-07 MED ORDER — TRIAMCINOLONE ACETONIDE 0.1 % EX CREA: 1.0000 "application " | TOPICAL_CREAM | Freq: Two times a day (BID) | CUTANEOUS | Status: DC

## 2014-07-07 MED ORDER — SULFAMETHOXAZOLE-TRIMETHOPRIM 800-160 MG PO TABS: 1.0000 | ORAL_TABLET | Freq: Two times a day (BID) | ORAL | Status: DC

## 2014-07-07 MED ORDER — ACETAMINOPHEN-CODEINE #3 300-30 MG PO TABS: 1.0000 | ORAL_TABLET | ORAL | Status: DC | PRN

## 2014-07-07 NOTE — Progress Notes (Signed)
Patient complains of having sores to bilateral feet and behind both legs Patient was being treated at the wound center and was not sure if he had to come here first To get another referral

## 2014-07-07 NOTE — Progress Notes (Signed)
MRN: [id number] Name: Erik Howe  Sex: male Age: 44 y.o. DOB: [birth year]  Allergies: Review of patient's allergies indicates no known allergies.  Chief Complaint  Patient presents with  . leg sores    HPI: Patient is 44 y.o. male who her history of lower extremity varicose veins, stasis dermatitis, history of cellulitis in the month of December he went to the emergency room and was treated with Keflex as per patient he was also following up with the vascular surgery In the past as well as saw a dermatologist, as per patient he was prescribed some cream which was expensive, currently denies any fever chills has some leg pain and is requesting some pain medication as per patient when he takes antibiotic if symptoms improve.  Past Medical History  Diagnosis Date  . Varicose veins   . Cellulitis 12/18/2012    left foot    Past Surgical History  Procedure Laterality Date  . Cholecystectomy    . Endovenous ablation saphenous vein w/ laser Left 09-30-2013    left greater saphenous vein      Medication List       This list is accurate as of: 07/07/14 11:26 AM.  Always use your most recent med list.               acetaminophen-codeine 300-30 MG per tablet  Commonly known as:  TYLENOL #3  Take 1 tablet by mouth every 8 (eight) hours as needed for moderate pain.     acetaminophen-codeine 300-30 MG per tablet  Commonly known as:  TYLENOL #3  Take 1 tablet by mouth every 4 (four) hours as needed for moderate pain.     cephALEXin 500 MG capsule  Commonly known as:  KEFLEX  Take 1 capsule (500 mg total) by mouth 4 (four) times daily.     cephALEXin 500 MG capsule  Commonly known as:  KEFLEX  Take 1 capsule (500 mg total) by mouth 4 (four) times daily.     diphenhydrAMINE 25 MG tablet  Commonly known as:  BENADRYL  Take 25 mg by mouth every 6 (six) hours as needed. For allergies     FLUoxetine 20 MG tablet  Commonly known as:  PROZAC  Take 2 tablets (40 mg total) by  mouth daily.     fluticasone 50 MCG/ACT nasal spray  Commonly known as:  FLONASE  Place 2 sprays into both nostrils daily.     furosemide 20 MG tablet  Commonly known as:  LASIX  Take 1 tablet (20 mg total) by mouth daily.     HYDROcodone-acetaminophen 5-325 MG per tablet  Commonly known as:  NORCO/VICODIN  Take 2 tablets by mouth every 4 (four) hours as needed.     hydrOXYzine 25 MG tablet  Commonly known as:  ATARAX/VISTARIL  Take 1 tablet (25 mg total) by mouth every 6 (six) hours.     ibuprofen 200 MG tablet  Commonly known as:  ADVIL,MOTRIN  Take 400 mg by mouth every 6 (six) hours as needed.     mupirocin cream 2 %  Commonly known as:  BACTROBAN  Apply topically 3 (three) times daily.     neomycin-bacitracin-polymyxin Oint  Commonly known as:  NEOSPORIN  Apply 1 application topically as needed. For place under arm     predniSONE 20 MG tablet  Commonly known as:  DELTASONE  1 po bid x 7 days, then 1 po q day x 7 days     sulfamethoxazole-trimethoprim 800-160  MG per tablet  Commonly known as:  BACTRIM DS  Take 1 tablet by mouth 2 (two) times daily.     sulfamethoxazole-trimethoprim 800-160 MG per tablet  Commonly known as:  BACTRIM DS  Take 1 tablet by mouth 2 (two) times daily.     sulfamethoxazole-trimethoprim 800-160 MG per tablet  Commonly known as:  BACTRIM DS,SEPTRA DS  Take 1 tablet by mouth 2 (two) times daily.     traMADol 50 MG tablet  Commonly known as:  ULTRAM  Take 1 tablet (50 mg total) by mouth every 8 (eight) hours as needed.     triamcinolone cream 0.1 %  Commonly known as:  KENALOG  Apply 1 application topically 2 (two) times daily.        Meds ordered this encounter  Medications  . triamcinolone cream (KENALOG) 0.1 %    Sig: Apply 1 application topically 2 (two) times daily.    Dispense:  45 g    Refill:  3  . acetaminophen-codeine (TYLENOL #3) 300-30 MG per tablet    Sig: Take 1 tablet by mouth every 4 (four) hours as needed for  moderate pain.    Dispense:  30 tablet    Refill:  0  . sulfamethoxazole-trimethoprim (BACTRIM DS,SEPTRA DS) 800-160 MG per tablet    Sig: Take 1 tablet by mouth 2 (two) times daily.    Dispense:  20 tablet    Refill:  0    Immunization History  Administered Date(s) Administered  . Influenza Whole 05/09/2007  . Influenza,inj,Quad PF,36+ Mos 06/24/2013    Family History  Problem Relation Age of Onset  . Varicose Veins Mother   . Heart disease Mother   . Diabetes Mother   . Arthritis Mother   . Hypertension Mother   . Stroke Father   . Heart disease Father     History  Substance Use Topics  . Smoking status: Current Every Day Smoker -- 0.15 packs/day    Types: Cigarettes  . Smokeless tobacco: Never Used  . Alcohol Use: No    Review of Systems   As noted in HPI  Filed Vitals:   07/07/14 1052  BP: 144/90  Pulse: 97  Temp: 98 F (36.7 C)  Resp: 16    Physical Exam  Physical Exam  Cardiovascular: Normal rate and regular rhythm.   Pulmonary/Chest: Breath sounds normal. No respiratory distress. He has no wheezes. He has no rales.  Musculoskeletal:  Bilateral legs dry scaly skin minimal warmth and tenderness of right leg     CBC    Component Value Date/Time   WBC 10.4 03/18/2013 0557   RBC 5.05 03/18/2013 0557   HGB 16.8 03/18/2013 0557   HCT 46.0 03/18/2013 0557   PLT 232 03/18/2013 0557   MCV 91.1 03/18/2013 0557   LYMPHSABS 3.8 03/18/2013 0557   MONOABS 0.8 03/18/2013 0557   EOSABS 0.2 03/18/2013 0557   BASOSABS 0.0 03/18/2013 0557    CMP     Component Value Date/Time   NA 138 03/18/2013 0557   K 3.6 03/18/2013 0557   CL 100 03/18/2013 0557   CO2 27 03/18/2013 0557   GLUCOSE 107* 03/18/2013 0557   BUN 6 03/18/2013 0557   CREATININE 0.75 03/18/2013 0557   CALCIUM 9.1 03/18/2013 0557   PROT 7.1 01/05/2007 2208   ALBUMIN 4.2 01/05/2007 2208   AST 20 01/05/2007 2208   ALT 28 01/05/2007 2208   ALKPHOS 48 01/05/2007 2208   BILITOT 0.7  01/05/2007 2208  GFRNONAA >90 03/18/2013 0557   GFRAA >90 03/18/2013 0557    No results found for: CHOL  No components found for: HGA1C  Lab Results  Component Value Date/Time   AST 20 01/05/2007 10:08 PM    Assessment and Plan  Pain of left lower extremity - Plan: acetaminophen-codeine (TYLENOL #3) 300-30 MG per tablet  Stasis dermatitis of both legs - Plan: we'll treat him for cellulitis sulfamethoxazole-trimethoprim (BACTRIM DS,SEPTRA DS) 800-160 MG per tablet  Eczema - Plan: triamcinolone cream (KENALOG) 0.1 %.  Discussed with patient he needs to follow with vascular as well as recommended to follow with dermatology , patient is alert and see a dermatologist. I prescribed Kenalog.  Lorayne Marek, MD

## 2014-07-09 ENCOUNTER — Other Ambulatory Visit: Payer: Self-pay | Admitting: Internal Medicine

## 2014-09-02 ENCOUNTER — Encounter: Payer: Self-pay | Admitting: Internal Medicine

## 2014-09-02 ENCOUNTER — Ambulatory Visit: Payer: Self-pay | Attending: Internal Medicine | Admitting: Internal Medicine

## 2014-09-02 VITALS — BP 133/87 | HR 103 | Temp 98.0°F | Resp 16 | Wt 333.6 lb

## 2014-09-02 DIAGNOSIS — I8311 Varicose veins of right lower extremity with inflammation: Secondary | ICD-10-CM

## 2014-09-02 DIAGNOSIS — M79605 Pain in left leg: Secondary | ICD-10-CM | POA: Insufficient documentation

## 2014-09-02 DIAGNOSIS — I872 Venous insufficiency (chronic) (peripheral): Secondary | ICD-10-CM | POA: Insufficient documentation

## 2014-09-02 DIAGNOSIS — I8310 Varicose veins of unspecified lower extremity with inflammation: Secondary | ICD-10-CM | POA: Insufficient documentation

## 2014-09-02 DIAGNOSIS — Z23 Encounter for immunization: Secondary | ICD-10-CM | POA: Insufficient documentation

## 2014-09-02 DIAGNOSIS — I8312 Varicose veins of left lower extremity with inflammation: Secondary | ICD-10-CM

## 2014-09-02 MED ORDER — SULFAMETHOXAZOLE-TRIMETHOPRIM 800-160 MG PO TABS: 1.0000 | ORAL_TABLET | Freq: Two times a day (BID) | ORAL | Status: DC

## 2014-09-02 MED ORDER — ACETAMINOPHEN-CODEINE #3 300-30 MG PO TABS: 1.0000 | ORAL_TABLET | Freq: Three times a day (TID) | ORAL | Status: DC | PRN

## 2014-09-02 MED ORDER — GABAPENTIN 300 MG PO CAPS: 300.0000 mg | ORAL_CAPSULE | Freq: Three times a day (TID) | ORAL | Status: DC

## 2014-09-02 MED ORDER — TETANUS-DIPHTH-ACELL PERTUSSIS 5-2.5-18.5 LF-MCG/0.5 IM SUSP
0.5000 mL | Freq: Once | INTRAMUSCULAR | Status: AC
  Administered 2014-09-02: 0.5 mL via INTRAMUSCULAR

## 2014-09-02 NOTE — Progress Notes (Signed)
Patient here for bilateral swelling and pain to both legs Both legs are swollen and red Requesting refill on his pain medication

## 2014-09-02 NOTE — Progress Notes (Signed)
MRN: [id number] Name: Erik Howe  Sex: male Age: 44 y.o. DOB: [birth year]  Allergies: Review of patient's allergies indicates no known allergies.  Chief Complaint  Patient presents with  . Follow-up    HPI: Patient is 44 y.o. male who history of cellulitis, status dermatitis, history of varicose veins comes today for followup and requesting some pain medication also he has been applying topical cream in the past he was following up with vascular as per patient he is waiting on continue orange card then he'll make an appointment with specialist. Complains of occasional chills denies any fever patient is also requesting antibiotic.  Past Medical History  Diagnosis Date  . Varicose veins   . Cellulitis 12/18/2012    left foot    Past Surgical History  Procedure Laterality Date  . Cholecystectomy    . Endovenous ablation saphenous vein w/ laser Left 09-30-2013    left greater saphenous vein      Medication List       This list is accurate as of: 09/02/14 12:08 PM.  Always use your most recent med list.               acetaminophen-codeine 300-30 MG per tablet  Commonly known as:  TYLENOL #3  Take 1 tablet by mouth every 4 (four) hours as needed for moderate pain.     acetaminophen-codeine 300-30 MG per tablet  Commonly known as:  TYLENOL #3  Take 1 tablet by mouth every 8 (eight) hours as needed for moderate pain.     cephALEXin 500 MG capsule  Commonly known as:  KEFLEX  Take 1 capsule (500 mg total) by mouth 4 (four) times daily.     cephALEXin 500 MG capsule  Commonly known as:  KEFLEX  Take 1 capsule (500 mg total) by mouth 4 (four) times daily.     diphenhydrAMINE 25 MG tablet  Commonly known as:  BENADRYL  Take 25 mg by mouth every 6 (six) hours as needed. For allergies     FLUoxetine 20 MG tablet  Commonly known as:  PROZAC  Take 2 tablets (40 mg total) by mouth daily.     fluticasone 50 MCG/ACT nasal spray  Commonly known as:  FLONASE  Place 2  sprays into both nostrils daily.     furosemide 20 MG tablet  Commonly known as:  LASIX  Take 1 tablet (20 mg total) by mouth daily.     gabapentin 300 MG capsule  Commonly known as:  NEURONTIN  Take 1 capsule (300 mg total) by mouth 3 (three) times daily.     HYDROcodone-acetaminophen 5-325 MG per tablet  Commonly known as:  NORCO/VICODIN  Take 2 tablets by mouth every 4 (four) hours as needed.     hydrOXYzine 25 MG tablet  Commonly known as:  ATARAX/VISTARIL  Take 1 tablet (25 mg total) by mouth every 6 (six) hours.     ibuprofen 200 MG tablet  Commonly known as:  ADVIL,MOTRIN  Take 400 mg by mouth every 6 (six) hours as needed.     mupirocin cream 2 %  Commonly known as:  BACTROBAN  Apply topically 3 (three) times daily.     neomycin-bacitracin-polymyxin Oint  Commonly known as:  NEOSPORIN  Apply 1 application topically as needed. For place under arm     predniSONE 20 MG tablet  Commonly known as:  DELTASONE  1 po bid x 7 days, then 1 po q day x 7 days  sulfamethoxazole-trimethoprim 800-160 MG per tablet  Commonly known as:  BACTRIM DS  Take 1 tablet by mouth 2 (two) times daily.     sulfamethoxazole-trimethoprim 800-160 MG per tablet  Commonly known as:  BACTRIM DS,SEPTRA DS  Take 1 tablet by mouth 2 (two) times daily.     traMADol 50 MG tablet  Commonly known as:  ULTRAM  Take 1 tablet (50 mg total) by mouth every 8 (eight) hours as needed.     triamcinolone cream 0.1 %  Commonly known as:  KENALOG  Apply 1 application topically 2 (two) times daily.        Meds ordered this encounter  Medications  . acetaminophen-codeine (TYLENOL #3) 300-30 MG per tablet    Sig: Take 1 tablet by mouth every 8 (eight) hours as needed for moderate pain.    Dispense:  60 tablet    Refill:  0  . sulfamethoxazole-trimethoprim (BACTRIM DS,SEPTRA DS) 800-160 MG per tablet    Sig: Take 1 tablet by mouth 2 (two) times daily.    Dispense:  20 tablet    Refill:  0  .  gabapentin (NEURONTIN) 300 MG capsule    Sig: Take 1 capsule (300 mg total) by mouth 3 (three) times daily.    Dispense:  90 capsule    Refill:  3  . Tdap (BOOSTRIX) injection 0.5 mL    Sig:     Immunization History  Administered Date(s) Administered  . Influenza Whole 05/09/2007  . Influenza,inj,Quad PF,36+ Mos 06/24/2013, 09/02/2014  . Tdap 09/02/2014    Family History  Problem Relation Age of Onset  . Varicose Veins Mother   . Heart disease Mother   . Diabetes Mother   . Arthritis Mother   . Hypertension Mother   . Stroke Father   . Heart disease Father     History  Substance Use Topics  . Smoking status: Current Every Day Smoker -- 0.15 packs/day    Types: Cigarettes  . Smokeless tobacco: Never Used  . Alcohol Use: No    Review of Systems   As noted in HPI  Filed Vitals:   09/02/14 1147  BP: 133/87  Pulse: 103  Temp: 98 F (36.7 C)  Resp: 16    Physical Exam  Physical Exam  Constitutional:  Obese male sitting comfortably not in acute distress  Eyes: EOM are normal. Pupils are equal, round, and reactive to light.  Musculoskeletal:  Again noted Bilateral legs dry scaly skin minimal warmth and tenderness of right leg      CBC    Component Value Date/Time   WBC 10.4 03/18/2013 0557   RBC 5.05 03/18/2013 0557   HGB 16.8 03/18/2013 0557   HCT 46.0 03/18/2013 0557   PLT 232 03/18/2013 0557   MCV 91.1 03/18/2013 0557   LYMPHSABS 3.8 03/18/2013 0557   MONOABS 0.8 03/18/2013 0557   EOSABS 0.2 03/18/2013 0557   BASOSABS 0.0 03/18/2013 0557    CMP     Component Value Date/Time   NA 138 03/18/2013 0557   K 3.6 03/18/2013 0557   CL 100 03/18/2013 0557   CO2 27 03/18/2013 0557   GLUCOSE 107* 03/18/2013 0557   BUN 6 03/18/2013 0557   CREATININE 0.75 03/18/2013 0557   CALCIUM 9.1 03/18/2013 0557   PROT 7.1 01/05/2007 2208   ALBUMIN 4.2 01/05/2007 2208   AST 20 01/05/2007 2208   ALT 28 01/05/2007 2208   ALKPHOS 48 01/05/2007 2208   BILITOT  0.7 01/05/2007 2208  GFRNONAA >90 03/18/2013 0557   GFRAA >90 03/18/2013 0557    No results found for: CHOL  No components found for: HGA1C  Lab Results  Component Value Date/Time   AST 20 01/05/2007 10:08 PM    Assessment and Plan  Pain of left lower extremity - Plan: acetaminophen-codeine (TYLENOL #3) 300-30 MG per tablet, gabapentin (NEURONTIN) 300 MG capsule  Stasis dermatitis of both legs - Plan: sulfamethoxazole-trimethoprim (BACTRIM DS,SEPTRA DS) 800-160 MG per tablet  Patient to make a followup appointment with vascular/dermatologist.  Needs flu shot  Need for Tdap vaccination - Plan: Tdap (BOOSTRIX) injection 0.5 mL   Health Maintenance  -Vaccinations:  Flu shot and tdap  given today.  Return if symptoms worsen or fail to improve.   This note has been created with Surveyor, quantity. Any transcriptional errors are unintentional.    Lorayne Marek, MD

## 2014-10-15 ENCOUNTER — Other Ambulatory Visit: Payer: Self-pay | Admitting: Internal Medicine

## 2014-11-24 ENCOUNTER — Telehealth: Payer: Self-pay | Admitting: Internal Medicine

## 2014-11-24 ENCOUNTER — Ambulatory Visit: Payer: Self-pay | Attending: Internal Medicine

## 2014-11-24 NOTE — Telephone Encounter (Signed)
Patient was just approved for financial assistance and would like a referral placed to Dermatology; please f/u with patient

## 2014-11-28 ENCOUNTER — Telehealth: Payer: Self-pay

## 2014-11-28 DIAGNOSIS — Z1389 Encounter for screening for other disorder: Secondary | ICD-10-CM

## 2014-11-28 NOTE — Telephone Encounter (Signed)
Pt returning nurse's call, please use mobile number.

## 2014-11-28 NOTE — Telephone Encounter (Signed)
Returned patient phone call Patient not available Referral for dermatology placed in epic Left message for patient to return our call

## 2014-12-27 ENCOUNTER — Encounter (HOSPITAL_COMMUNITY): Payer: Self-pay | Admitting: *Deleted

## 2014-12-27 ENCOUNTER — Emergency Department (HOSPITAL_COMMUNITY): Payer: Self-pay

## 2014-12-27 ENCOUNTER — Emergency Department (HOSPITAL_COMMUNITY)
Admission: EM | Admit: 2014-12-27 | Discharge: 2014-12-27 | Disposition: A | Discharge status: Home / Self Care | Payer: Self-pay | Attending: Emergency Medicine | Admitting: Emergency Medicine

## 2014-12-27 DIAGNOSIS — R0789 Other chest pain: Secondary | ICD-10-CM | POA: Insufficient documentation

## 2014-12-27 DIAGNOSIS — R110 Nausea: Secondary | ICD-10-CM | POA: Insufficient documentation

## 2014-12-27 DIAGNOSIS — R0602 Shortness of breath: Secondary | ICD-10-CM | POA: Insufficient documentation

## 2014-12-27 DIAGNOSIS — Z720 Tobacco use: Secondary | ICD-10-CM | POA: Insufficient documentation

## 2014-12-27 DIAGNOSIS — Z872 Personal history of diseases of the skin and subcutaneous tissue: Secondary | ICD-10-CM | POA: Insufficient documentation

## 2014-12-27 DIAGNOSIS — Z7982 Long term (current) use of aspirin: Secondary | ICD-10-CM | POA: Insufficient documentation

## 2014-12-27 LAB — CBC
HCT: 48.3 % (ref 39.0–52.0)
HEMOGLOBIN: 16.8 g/dL (ref 13.0–17.0)
MCH: 32 pg (ref 26.0–34.0)
MCHC: 34.8 g/dL (ref 30.0–36.0)
MCV: 92 fL (ref 78.0–100.0)
Platelets: 236 10*3/uL (ref 150–400)
RBC: 5.25 MIL/uL (ref 4.22–5.81)
RDW: 13.2 % (ref 11.5–15.5)
WBC: 8.3 10*3/uL (ref 4.0–10.5)

## 2014-12-27 LAB — BASIC METABOLIC PANEL
Anion gap: 11 (ref 5–15)
BUN: 12 mg/dL (ref 6–20)
CALCIUM: 9.3 mg/dL (ref 8.9–10.3)
CO2: 26 mmol/L (ref 22–32)
Chloride: 102 mmol/L (ref 101–111)
Creatinine, Ser: 0.75 mg/dL (ref 0.61–1.24)
GFR calc Af Amer: >60 mL/min (ref >60)
GFR calc non Af Amer: >60 mL/min (ref >60)
GLUCOSE: 98 mg/dL (ref 65–99)
Potassium: 3.6 mmol/L (ref 3.5–5.1)
Sodium: 139 mmol/L (ref 135–145)

## 2014-12-27 LAB — I-STAT TROPONIN, ED: TROPONIN I, POC: 0 ng/mL (ref 0.00–0.08)

## 2014-12-27 MED ORDER — ONDANSETRON 4 MG PO TBDP: ORAL_TABLET | ORAL | Status: DC

## 2014-12-27 MED ORDER — ONDANSETRON 8 MG PO TBDP
8.0000 mg | ORAL_TABLET | Freq: Once | ORAL | Status: AC
  Administered 2014-12-27: 8 mg via ORAL
  Filled 2014-12-27: qty 1

## 2014-12-27 NOTE — ED Provider Notes (Signed)
CSN: [id number]     Arrival date & time 12/27/14  6295 History   First MD Initiated Contact with Patient 12/27/14 [phone]     Chief Complaint  Patient presents with  . Chest Pain  . Nausea  . Shortness of Breath     (Consider location/radiation/quality/duration/timing/severity/associated sxs/prior Treatment) HPI Erik Howe is a 44 y.o. male history of chronic dermatitis, comes in for evaluation of chest discomfort and nausea. Patient states he has been nauseous since Wednesday intermittently. Denies any vomiting. Has not tried anything to improve the symptoms. Nothing makes them better or worse. He also reports intermittent chest discomfort since Wednesday. Onset at rest, nonexertional. Most recent episode was approximately one half hours ago. Episodes are described as sharp, burning discomfort they will originate in his lower abdomen and migrate to his lower chest, upper chest and left shoulder. Episodes last a couple minutes and go away on their own. Nothing makes these better or worse. Rates the discomfort as 3/10. Denies headache, changes in vision, diaphoresis, shortness of breath, numbness or weakness, fevers or chills. Currently takes 81 mg ASA. Smokes 2 cigarettes per day.  Past Medical History  Diagnosis Date  . Varicose veins   . Cellulitis 12/18/2012    left foot   Past Surgical History  Procedure Laterality Date  . Cholecystectomy    . Endovenous ablation saphenous vein w/ laser Left 09-30-2013    left greater saphenous vein   Family History  Problem Relation Age of Onset  . Varicose Veins Mother   . Heart disease Mother   . Diabetes Mother   . Arthritis Mother   . Hypertension Mother   . Stroke Father   . Heart disease Father    History  Substance Use Topics  . Smoking status: Current Every Day Smoker -- 0.15 packs/day    Types: Cigarettes  . Smokeless tobacco: Never Used  . Alcohol Use: No    Review of Systems A 10 point review of systems was completed and was  negative except for pertinent positives and negatives as mentioned in the history of present illness     Allergies  Review of patient's allergies indicates no known allergies.  Home Medications   Prior to Admission medications   Medication Sig Start Date End Date Taking? Authorizing Provider  aspirin EC 81 MG tablet Take 81 mg by mouth daily.   Yes Historical Provider, MD  bismuth subsalicylate (PEPTO BISMOL) 262 MG chewable tablet Chew 524 mg by mouth as needed for indigestion.   Yes Historical Provider, MD  diphenhydrAMINE (BENADRYL) 25 MG tablet Take 25 mg by mouth every 6 (six) hours as needed. For allergies   Yes Historical Provider, MD  hydrOXYzine (ATARAX/VISTARIL) 25 MG tablet Take 25 mg by mouth 3 (three) times daily as needed for itching.   Yes Historical Provider, MD  ondansetron (ZOFRAN ODT) 4 MG disintegrating tablet 4mg  ODT q4 hours prn nausea/vomit 12/27/14   Ziaire Bieser, PA-C   BP 135/92 mmHg  Pulse 80  Temp(Src) 98.2 F (36.8 C) (Oral)  Resp 17  Ht 6\' 2"  (1.88 m)  Wt 325 lb (147.419 kg)  BMI 41.71 kg/m2  SpO2 96% Physical Exam  Constitutional: He is oriented to person, place, and time. He appears well-developed and well-nourished.  HENT:  Head: Normocephalic and atraumatic.  Mouth/Throat: Oropharynx is clear and moist.  Eyes: Conjunctivae are normal. Pupils are equal, round, and reactive to light. Right eye exhibits no discharge. Left eye exhibits no discharge. No scleral  icterus.  Neck: Neck supple.  Cardiovascular: Normal rate, regular rhythm and normal heart sounds.   Pulmonary/Chest: Effort normal and breath sounds normal. No respiratory distress. He has no wheezes. He has no rales.  Abdominal: Soft. There is no tenderness.  Obese. Mild discomfort with palpation to the umbilical area and upper abdomen. Abdomen is otherwise soft, nondistended without any tenderness. No rebound or guarding. No lesions or deformities noted.  Musculoskeletal: He exhibits no  tenderness.  Neurological: He is alert and oriented to person, place, and time.  Cranial Nerves II-XII grossly intact  Skin: Skin is warm and dry. No rash noted.  Dry, scaling noted diffusely throughout all extremities.  Psychiatric: He has a normal mood and affect.  Nursing note and vitals reviewed.   ED Course  Procedures (including critical care time) Labs Review Labs Reviewed  BASIC METABOLIC PANEL  [place], ED    Imaging Review Dg Chest 2 View  12/27/2014   CLINICAL DATA:  Patient with diffuse chest pain radiating to the shoulders. Nausea.  EXAM: CHEST  2 VIEW  COMPARISON:  Chest radiograph 01/11/2011  FINDINGS: Monitoring leads overlie the patient. Stable cardiac and mediastinal contours. No consolidative pulmonary opacities. No pleural effusion or pneumothorax. Regional skeleton is unremarkable.  IMPRESSION: No acute cardiopulmonary process.   Electronically Signed   By: Lovey Newcomer M.D.   On: 12/27/2014 08:07     EKG Interpretation   Date/Time:  Saturday December 27 2014 06:56:11 EDT Ventricular Rate:  83 PR Interval:  176 QRS Duration: 105 QT Interval:  380 QTC Calculation: 446 R Axis:   59 Text Interpretation:  Sinus rhythm No significant change since last  tracing Confirmed by Debby Freiberg [phone]) on 12/27/2014 7:05:38 AM     Meds given in ED:  Medications  ondansetron (ZOFRAN-ODT) disintegrating tablet 8 mg (not administered)    New Prescriptions   ONDANSETRON (ZOFRAN ODT) 4 MG DISINTEGRATING TABLET    4mg  ODT q4 hours prn nausea/vomit   Filed Vitals:   12/27/14 0702 12/27/14 0727 12/27/14 0800 12/27/14 0830  BP:  130/69 152/86 135/92  Pulse:  76 73 80  Temp:  98.2 F (36.8 C)    TempSrc: Oral Oral    Resp:  18 15 17   Height:      Weight:      SpO2:  98% 96% 96%     MDM  Vitals stable - WNL -afebrile Pt resting comfortably in ED. No chest discomfort in ED PE--Lung exam benign. Cardiac auscultation reveals no murmurs rubs or gallops.  Grossly Benign Physical Exam Labwork: Troponin negative. EKG reassuring. Labs otherwise noncontributory Imaging: CXR shows no acute cardio pulmonary pathology I personally reviewed the imaging and agree with the results as interpreted by the radiologist.  DDX: Patient with atypical, non exertional, migrating abdominal and chest discomfort since Wednesday 7/6. Clinical picture and exam today not consistent with ACS/dissection. Heart score 2, secondary to risk factors of smoking, obesity and family history. No evidence of spontaneous pneumothorax, esophageal rupture or other mediastinitis. PERC negative, doubt PE. No evidence of myocarditis, endocarditis, pericarditis.   I discussed all relevant lab findings and imaging results with pt and they verbalized understanding. Discussed f/u with PCP within 48 hrs and return precautions, pt very amenable to plan.   Final diagnoses:  Nausea  Chest discomfort       Comer Locket, PA-C 12/27/14 6314  Debby Freiberg, MD 12/29/14 [phone]

## 2014-12-27 NOTE — ED Notes (Signed)
Awake. Verbally responsive. A/O x4. Resp even and unlabored. No audible adventitious breath sounds noted. ABC's intact. SR on monitor. Pt denies SHOB and diaphoresis. Pt reported having nausea without emesis and diarrhea. Pt reported RUQ abd intermittent pain. LBM was 0200.

## 2014-12-27 NOTE — ED Notes (Signed)
Pt states he has chest pain,  Nausea,  Close to vomiting prior to arrival  Pain goes to back and left shoulder,  Sob and some dizziness,  Pain 4/10 to left shoulder

## 2014-12-27 NOTE — Discharge Instructions (Signed)
You were evaluated in the ED stay for your chest discomfort and nausea. There does not appear to be an emergent cause for your symptoms at this time. Your exam, labs, EKG and chest x-ray were all reassuring. Please take your medications as needed for nausea. Please follow-up with your primary care for further valuation management of your symptoms. Return to ED for worsening symptoms.  Chest Pain (Nonspecific) It is often hard to give a specific diagnosis for the cause of chest pain. There is always a chance that your pain could be related to something serious, such as a heart attack or a blood clot in the lungs. You need to follow up with your health care provider for further evaluation. CAUSES   Heartburn.  Pneumonia or bronchitis.  Anxiety or stress.  Inflammation around your heart (pericarditis) or lung (pleuritis or pleurisy).  A blood clot in the lung.  A collapsed lung (pneumothorax). It can develop suddenly on its own (spontaneous pneumothorax) or from trauma to the chest.  Shingles infection (herpes zoster virus). The chest wall is composed of bones, muscles, and cartilage. Any of these can be the source of the pain.  The bones can be bruised by injury.  The muscles or cartilage can be strained by coughing or overwork.  The cartilage can be affected by inflammation and become sore (costochondritis). DIAGNOSIS  Lab tests or other studies may be needed to find the cause of your pain. Your health care provider may have you take a test called an ambulatory electrocardiogram (ECG). An ECG records your heartbeat patterns over a 24-hour period. You may also have other tests, such as:  Transthoracic echocardiogram (TTE). During echocardiography, sound waves are used to evaluate how blood flows through your heart.  Transesophageal echocardiogram (TEE).  Cardiac monitoring. This allows your health care provider to monitor your heart rate and rhythm in real time.  Holter monitor. This  is a portable device that records your heartbeat and can help diagnose heart arrhythmias. It allows your health care provider to track your heart activity for several days, if needed.  Stress tests by exercise or by giving medicine that makes the heart beat faster. TREATMENT   Treatment depends on what may be causing your chest pain. Treatment may include:  Acid blockers for heartburn.  Anti-inflammatory medicine.  Pain medicine for inflammatory conditions.  Antibiotics if an infection is present.  You may be advised to change lifestyle habits. This includes stopping smoking and avoiding alcohol, caffeine, and chocolate.  You may be advised to keep your head raised (elevated) when sleeping. This reduces the chance of acid going backward from your stomach into your esophagus. Most of the time, nonspecific chest pain will improve within 2-3 days with rest and mild pain medicine.  HOME CARE INSTRUCTIONS   If antibiotics were prescribed, take them as directed. Finish them even if you start to feel better.  For the next few days, avoid physical activities that bring on chest pain. Continue physical activities as directed.  Do not use any tobacco products, including cigarettes, chewing tobacco, or electronic cigarettes.  Avoid drinking alcohol.  Only take medicine as directed by your health care provider.  Follow your health care provider's suggestions for further testing if your chest pain does not go away.  Keep any follow-up appointments you made. If you do not go to an appointment, you could develop lasting (chronic) problems with pain. If there is any problem keeping an appointment, call to reschedule. SEEK MEDICAL CARE IF:  Your chest pain does not go away, even after treatment.  You have a rash with blisters on your chest.  You have a fever. SEEK IMMEDIATE MEDICAL CARE IF:   You have increased chest pain or pain that spreads to your arm, neck, jaw, back, or  abdomen.  You have shortness of breath.  You have an increasing cough, or you cough up blood.  You have severe back or abdominal pain.  You feel nauseous or vomit.  You have severe weakness.  You faint.  You have chills. This is an emergency. Do not wait to see if the pain will go away. Get medical help at once. Call your local emergency services (911 in [place] Do not drive yourself to the hospital. MAKE SURE YOU:   Understand these instructions.  Will watch your condition.  Will get help right away if you are not doing well or get worse. Document Released: 03/16/2005 Document Revised: 06/11/2013 Document Reviewed: 01/10/2008 [facility] Patient Information 2015 [place], [place]. This information is not intended to replace advice given to you by your health care provider. Make sure you discuss any questions you have with your health care provider.  Nausea, Adult Nausea is the feeling that you have an upset stomach or have to vomit. Nausea by itself is not likely a serious concern, but it may be an early sign of more serious medical problems. As nausea gets worse, it can lead to vomiting. If vomiting develops, there is the risk of dehydration.  CAUSES   Viral infections.  Food poisoning.  Medicines.  Pregnancy.  Motion sickness.  Migraine headaches.  Emotional distress.  Severe pain from any source.  Alcohol intoxication. HOME CARE INSTRUCTIONS  Get plenty of rest.  Ask your caregiver about specific rehydration instructions.  Eat small amounts of food and sip liquids more often.  Take all medicines as told by your caregiver. SEEK MEDICAL CARE IF:  You have not improved after 2 days, or you get worse.  You have a headache. SEEK IMMEDIATE MEDICAL CARE IF:   You have a fever.  You faint.  You keep vomiting or have blood in your vomit.  You are extremely weak or dehydrated.  You have dark or bloody stools.  You have severe chest or abdominal pain. MAKE  SURE YOU:  Understand these instructions.  Will watch your condition.  Will get help right away if you are not doing well or get worse. Document Released: 07/14/2004 Document Revised: 02/29/2012 Document Reviewed: 02/16/2011 [facility] Patient Information 2015 [place], [place]. This information is not intended to replace advice given to you by your health care provider. Make sure you discuss any questions you have with your health care provider.

## 2014-12-27 NOTE — ED Notes (Signed)
Pt taken to x-ray and returned without distress noted.

## 2014-12-27 NOTE — ED Notes (Signed)
Awake. Verbally responsive. A/O x4. Resp even and unlabored. No audible adventitious breath sounds noted. ABC's intact.  

## 2014-12-27 NOTE — ED Notes (Signed)
Pt took one baby ASA prior to arrival

## 2014-12-27 NOTE — ED Notes (Signed)
Awake. Verbally responsive. A/O x4. Resp even and unlabored. No audible adventitious breath sounds noted. ABC's intact. SR on monitor. IV saline lock patent and intact. 

## 2015-01-13 ENCOUNTER — Telehealth: Payer: Self-pay | Admitting: Internal Medicine

## 2015-01-13 ENCOUNTER — Telehealth: Payer: Self-pay

## 2015-01-13 MED ORDER — HYDROXYZINE HCL 25 MG PO TABS: 25.0000 mg | ORAL_TABLET | Freq: Three times a day (TID) | ORAL | Status: DC | PRN

## 2015-01-13 NOTE — Telephone Encounter (Signed)
Returned patient phone call Patient not available Left message on voice mail to return our call Prescription sent to [facility] on file

## 2015-01-13 NOTE — Telephone Encounter (Signed)
Patient called to request a med refill for hydrOXYzine (ATARAX/VISTARIL) 25 MG tablet , patient uses [facility] on [address]. Please f/u

## 2015-01-13 NOTE — Telephone Encounter (Signed)
Returned patient phone call and he is aware His prescription was refilled and sent to the pharmacy

## 2015-06-16 NOTE — Telephone Encounter (Signed)
error 

## 2017-12-10 ENCOUNTER — Encounter (HOSPITAL_COMMUNITY): Payer: Self-pay | Admitting: Emergency Medicine

## 2017-12-10 ENCOUNTER — Other Ambulatory Visit: Payer: Self-pay

## 2017-12-10 ENCOUNTER — Emergency Department (HOSPITAL_COMMUNITY)
Admission: EM | Admit: 2017-12-10 | Discharge: 2017-12-10 | Disposition: A | Discharge status: Home / Self Care | Payer: Self-pay | Attending: Emergency Medicine | Admitting: Emergency Medicine

## 2017-12-10 DIAGNOSIS — M79662 Pain in left lower leg: Secondary | ICD-10-CM | POA: Insufficient documentation

## 2017-12-10 DIAGNOSIS — F17210 Nicotine dependence, cigarettes, uncomplicated: Secondary | ICD-10-CM | POA: Insufficient documentation

## 2017-12-10 DIAGNOSIS — L03119 Cellulitis of unspecified part of limb: Secondary | ICD-10-CM | POA: Insufficient documentation

## 2017-12-10 DIAGNOSIS — R21 Rash and other nonspecific skin eruption: Secondary | ICD-10-CM | POA: Insufficient documentation

## 2017-12-10 DIAGNOSIS — L309 Dermatitis, unspecified: Secondary | ICD-10-CM | POA: Insufficient documentation

## 2017-12-10 DIAGNOSIS — M79661 Pain in right lower leg: Secondary | ICD-10-CM | POA: Insufficient documentation

## 2017-12-10 LAB — COMPREHENSIVE METABOLIC PANEL
ALBUMIN: 3.6 g/dL (ref 3.5–5.0)
ALT: 24 U/L (ref 17–63)
ANION GAP: 7 (ref 5–15)
AST: 28 U/L (ref 15–41)
Alkaline Phosphatase: 87 U/L (ref 38–126)
BILIRUBIN TOTAL: 1.1 mg/dL (ref 0.3–1.2)
BUN: 10 mg/dL (ref 6–20)
CHLORIDE: 104 mmol/L (ref 101–111)
CO2: 27 mmol/L (ref 22–32)
Calcium: 8.6 mg/dL — ABNORMAL LOW (ref 8.9–10.3)
Creatinine, Ser: 0.95 mg/dL (ref 0.61–1.24)
GFR calc Af Amer: >60 mL/min (ref >60)
GFR calc non Af Amer: >60 mL/min (ref >60)
GLUCOSE: 107 mg/dL — AB (ref 65–99)
POTASSIUM: 3.3 mmol/L — AB (ref 3.5–5.1)
SODIUM: 138 mmol/L (ref 135–145)
Total Protein: 6.9 g/dL (ref 6.5–8.1)

## 2017-12-10 LAB — CBC WITH DIFFERENTIAL/PLATELET
BASOS ABS: 0 10*3/uL (ref 0.0–0.1)
Basophils Relative: 0 %
EOS PCT: 3 %
Eosinophils Absolute: 0.3 10*3/uL (ref 0.0–0.7)
HEMATOCRIT: 46.1 % (ref 39.0–52.0)
Hemoglobin: 16.4 g/dL (ref 13.0–17.0)
LYMPHS ABS: 1.9 10*3/uL (ref 0.7–4.0)
LYMPHS PCT: 20 %
MCH: 34 pg (ref 26.0–34.0)
MCHC: 35.6 g/dL (ref 30.0–36.0)
MCV: 95.6 fL (ref 78.0–100.0)
MONO ABS: 1 10*3/uL (ref 0.1–1.0)
MONOS PCT: 10 %
NEUTROS ABS: 6.3 10*3/uL (ref 1.7–7.7)
Neutrophils Relative %: 67 %
PLATELETS: 305 10*3/uL (ref 150–400)
RBC: 4.82 MIL/uL (ref 4.22–5.81)
RDW: 13.2 % (ref 11.5–15.5)
WBC: 9.4 10*3/uL (ref 4.0–10.5)

## 2017-12-10 LAB — SEDIMENTATION RATE: Sed Rate: 2 mm/hr (ref 0–16)

## 2017-12-10 MED ORDER — ACETAMINOPHEN 500 MG PO TABS
1000.0000 mg | ORAL_TABLET | Freq: Once | ORAL | Status: AC
  Administered 2017-12-10: 1000 mg via ORAL
  Filled 2017-12-10: qty 2

## 2017-12-10 MED ORDER — HYDROCODONE-ACETAMINOPHEN 5-325 MG PO TABS: 1.0000 | ORAL_TABLET | Freq: Four times a day (QID) | ORAL | 0 refills | Status: DC | PRN

## 2017-12-10 MED ORDER — FUROSEMIDE 40 MG PO TABS
20.0000 mg | ORAL_TABLET | Freq: Once | ORAL | Status: AC
Start: 2017-12-10 — End: 2017-12-10
  Administered 2017-12-10: 20 mg via ORAL
  Filled 2017-12-10: qty 1

## 2017-12-10 MED ORDER — TRAMADOL HCL 50 MG PO TABS
100.0000 mg | ORAL_TABLET | Freq: Once | ORAL | Status: AC
  Administered 2017-12-10: 100 mg via ORAL
  Filled 2017-12-10: qty 2

## 2017-12-10 MED ORDER — CEPHALEXIN 500 MG PO CAPS
1000.0000 mg | ORAL_CAPSULE | Freq: Once | ORAL | Status: AC
  Administered 2017-12-10: 1000 mg via ORAL
  Filled 2017-12-10: qty 2

## 2017-12-10 MED ORDER — CEPHALEXIN 500 MG PO CAPS: 1000.0000 mg | ORAL_CAPSULE | Freq: Two times a day (BID) | ORAL | 0 refills | Status: DC

## 2017-12-10 MED ORDER — FUROSEMIDE 20 MG PO TABS: 20.0000 mg | ORAL_TABLET | Freq: Every day | ORAL | 0 refills | Status: DC

## 2017-12-10 NOTE — Discharge Instructions (Signed)
1.  You need to see a dermatologist for your chronic and severe skin rash.  You should be contacted by the case manager to help you with establishing medical care.  You can also call the referral number in your discharge instructions for help finding a physician. 2.  At this time, take antibiotics as prescribed.  Take Lasix daily for the next 3 to 5 days.  You may then take the Lasix occasionally if your swelling is increasing beyond the normal level.  Keep your feet elevated as much as possible.  Take Vicodin 1 to 2 tablets if needed for severe pain. 3.  It is extremely important that you have a family physician who is monitoring the degree of swelling in your legs your rash and use of Lasix.

## 2017-12-10 NOTE — Care Management Note (Signed)
Case Management Note  Patient Details  Name: Erik Howe MRN: [id number] Date of Birth: [birth year]  Subjective/Objective:    cellulitis                Action/Plan: Attempted call to pt and left HIPAA compliant message on voicemail for return call. Pt has been seen to [facility] in the past. No insurance listed.   Expected Discharge Date:                  Expected Discharge Plan:  Home/Self Care  In-House Referral:  NA  Discharge planning Services  CM Consult  Post Acute Care Choice:  NA Choice offered to:  NA  DME Arranged:  N/A DME Agency:  NA  HH Arranged:  NA HH Agency:  NA  Status of Service:  Completed, signed off  If discussed at [place] of Stay Meetings, dates discussed:    Additional Comments:  Erenest Rasher, RN 12/10/2017, 3:52 PM

## 2017-12-10 NOTE — ED Provider Notes (Signed)
[facility] DEPT Provider Note   CSN: [id number] Arrival date & time: 12/10/17  0553     History   Chief Complaint Chief Complaint  Patient presents with  . Leg Pain    bilateral    HPI Erik Howe is a 47 y.o. male.  HPI Patient has problems with chronic severe rash and swelling of his legs.  He reports it has gotten worse over the past week or so.  He reports both feet of gotten very swollen and they are painful for walking.  He reports his skin continuously sloughs and leaves raw patches.  He reports once that heals over then the process starts again.  He reports he has been seen by a dermatologist once a number of years ago and was prescribed a cream that was way too expensive.  He does not report ever having continuous management of his dermatitis condition.  Patient denies he has had any fever or chills.  He denies he is had chest pain.  He reports he gets some chest congestion chronically with seasonal changes.  Patient reports he lives with his elderly mother for whom he is caring.  He reports she has dementia and is needing increasing levels of care.  He reports he has no insurance and has not been able to seek treatment on a continuous basis for his health care. Past Medical History:  Diagnosis Date  . Cellulitis 12/18/2012   left foot  . Varicose veins     Patient Active Problem List   Diagnosis Date Noted  . Varicose veins of lower extremities with other complications 24/40/1027  . Stasis dermatitis of both legs 07/22/2013  . Cellulitis of leg, left 03/28/2013  . 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 Severe obesity (BMI >= 40) ([place]) 03/28/2013  . Depression 12/05/2010  . TOBACCO USER 03/28/2009    Past Surgical History:  Procedure Laterality Date  . CHOLECYSTECTOMY    . ENDOVENOUS ABLATION SAPHENOUS VEIN W/ LASER Left 09-30-2013   left greater saphenous vein        Home Medications    Prior to Admission medications     Medication Sig Start Date End Date Taking? Authorizing Provider  acetaminophen (TYLENOL) 500 MG tablet Take 500 mg by mouth every 6 (six) hours as needed for moderate pain or headache.   Yes [provider]  cephALEXin (KEFLEX) 500 MG capsule Take 2 capsules (1,000 mg total) by mouth 2 (two) times daily. 12/10/17   Charlesetta Shanks, MD  furosemide (LASIX) 20 MG tablet Take 1 tablet (20 mg total) by mouth daily. Take 1 tablet daily for 3 to 5 days while treating your lower leg swelling. 12/10/17   Charlesetta Shanks, MD  HYDROcodone-acetaminophen (NORCO/VICODIN) 5-325 MG tablet Take 1-2 tablets by mouth every 6 (six) hours as needed for moderate pain or severe pain. 12/10/17   Charlesetta Shanks, MD  hydrOXYzine (ATARAX/VISTARIL) 25 MG tablet Take 1 tablet (25 mg total) by mouth 3 (three) times daily as needed for itching. Patient not taking: Reported on 12/10/2017 01/13/15   Lorayne Marek, MD  ondansetron (ZOFRAN ODT) 4 MG disintegrating tablet 4mg  ODT q4 hours prn nausea/vomit Patient not taking: Reported on 12/10/2017 12/27/14   Comer Locket, PA-C  albuterol (PROVENTIL HFA;VENTOLIN HFA) 108 (90 BASE) MCG/ACT inhaler Inhale 2 puffs into the lungs every 6 (six) hours as needed for wheezing. 01/12/11 09/05/11  Cletus Gash, MD    Family History Family History  Problem Relation Age of Onset  . Varicose  Veins Mother   . Heart disease Mother   . Diabetes Mother   . Arthritis Mother   . Hypertension Mother   . Stroke Father   . Heart disease Father     Social History Social History   Tobacco Use  . Smoking status: Current Every Day Smoker    Packs/day: 0.15    Types: Cigarettes  . Smokeless tobacco: Never Used  Substance Use Topics  . Alcohol use: No  . Drug use: No     Allergies   Patient has no known allergies.   Review of Systems Review of Systems 10 Systems reviewed and are negative for acute change except as noted in the HPI.  Physical Exam Updated Vital  Signs BP (!) 159/117 (BP Location: Left Arm)   Pulse (!) 101   Temp 98.2 F (36.8 C) (Oral)   Resp 18   Ht 6\' 2"  (1.88 m)   Wt (!) 149.7 kg (330 lb)   SpO2 98%   BMI 42.37 kg/m   Physical Exam  Constitutional: He is oriented to person, place, and time.  Patient is alert and nontoxic.  No respiratory distress.  Morbid obesity.  HENT:  Head: Normocephalic and atraumatic.  Face is fairly well spared from rash.  Oral cavity pink and moist.  No oral lesions.  Eyes: Pupils are equal, round, and reactive to light. EOM are normal.  Cardiovascular: Normal rate, regular rhythm, normal heart sounds and intact distal pulses.  Pulmonary/Chest:  Occasional cough.  No respiratory distress.  Scattered rhonchi to auscultation clears with cough.  Abdominal: Soft. He exhibits no distension. There is no tenderness. There is no guarding.  Musculoskeletal:  Patient has severe bilateral peripheral edema 3+.  Scaling plaque of skin dense in certain patches.  Erythema of the feet with severe nail changes.  See attached images.  Neurological: He is alert and oriented to person, place, and time. He exhibits normal muscle tone. Coordination normal.  Skin: Skin is warm and dry. Rash noted.  Psychiatric: He has a normal mood and affect.             ED Treatments / Results  Labs (all labs ordered are listed, but only abnormal results are displayed) Labs Reviewed  COMPREHENSIVE METABOLIC PANEL - Abnormal; Notable for the following components:      Result Value   Potassium 3.3 (*)    Glucose, Bld 107 (*)    Calcium 8.6 (*)    All other components within normal limits  CBC WITH DIFFERENTIAL/PLATELET  SEDIMENTATION RATE    EKG None  Radiology No results found.  Procedures Procedures (including critical care time)  Medications Ordered in ED Medications  cephALEXin (KEFLEX) capsule 1,000 mg (1,000 mg Oral Given 12/10/17 1037)  furosemide (LASIX) tablet 20 mg (20 mg Oral Given 12/10/17 1036)   traMADol (ULTRAM) tablet 100 mg (100 mg Oral Given 12/10/17 1037)  acetaminophen (TYLENOL) tablet 1,000 mg (1,000 mg Oral Given 12/10/17 1037)     Initial Impression / Assessment and Plan / ED Course  I have reviewed the triage vital signs and the nursing notes.  Pertinent labs & imaging results that were available during my care of the patient were reviewed by me and considered in my medical decision making (see chart for details).      Final Clinical Impressions(s) / ED Diagnoses   Final diagnoses:  Cellulitis of lower extremity, unspecified laterality  Eczema, unspecified type  Patient has a chronic rash.  Etiology not clearly documented  by EMR.  Differential includes severe eczema, ichthyosis, psoriasis.  Patient does not describe any consistent treatment for this.  He does also at this time have peripheral edema with erythema of the feet and lower legs.  He reports over the past 2 weeks the swelling pain and redness have worsened.  By description, this waxes and wanes.  He does not have associated symptoms of fever or generalized constitutional symptoms or leukocytosis to suggest severe infection.  At this time I feel he is stable to start outpatient treatment with Keflex, Lasix for peripheral edema and Vicodin as needed pain.  I have counseled him on the importance of chronic management of the rash with a primary care doctor plus specialty dermatology.  Social work has been consulted to try to assist the patient in accessing appropriate medical care.  He will be called at home.  Return precautions reviewed.  ED Discharge Orders        Ordered    cephALEXin (KEFLEX) 500 MG capsule  2 times daily     12/10/17 1038    HYDROcodone-acetaminophen (NORCO/VICODIN) 5-325 MG tablet  Every 6 hours PRN     12/10/17 1038    furosemide (LASIX) 20 MG tablet  Daily     12/10/17 1038       Charlesetta Shanks, MD 12/10/17 1048

## 2017-12-10 NOTE — Progress Notes (Signed)
CSW consulted. CSW followed up with patient's EDP. EDP informed CSW that patient needs a dermatologist, CSW agreed to notify Weekend RNCM. CSW notified weekend [facility] RNCM agreed to follow up with patient. CSW signing off, no other needs identified at this time.  Abundio Miu, [place] Social Worker [facility] Cell#: [phone]

## 2017-12-10 NOTE — ED Triage Notes (Signed)
Pt complaining of bilateral leg/foot pain and swelling x2 weeks. Pt has not taken anything for pain.

## 2018-02-11 ENCOUNTER — Encounter (HOSPITAL_COMMUNITY): Payer: Self-pay | Admitting: Emergency Medicine

## 2018-02-11 ENCOUNTER — Emergency Department (HOSPITAL_BASED_OUTPATIENT_CLINIC_OR_DEPARTMENT_OTHER)
Admission: EM | Admit: 2018-02-11 | Discharge: 2018-02-11 | Disposition: A | Discharge status: Home / Self Care | Payer: Self-pay | Source: Home / Self Care

## 2018-02-11 ENCOUNTER — Emergency Department (HOSPITAL_COMMUNITY)
Admission: EM | Admit: 2018-02-11 | Discharge: 2018-02-11 | Disposition: A | Discharge status: Home / Self Care | Payer: Self-pay | Attending: Emergency Medicine | Admitting: Emergency Medicine

## 2018-02-11 ENCOUNTER — Emergency Department (HOSPITAL_COMMUNITY): Payer: Self-pay

## 2018-02-11 DIAGNOSIS — M79604 Pain in right leg: Secondary | ICD-10-CM | POA: Insufficient documentation

## 2018-02-11 DIAGNOSIS — F17210 Nicotine dependence, cigarettes, uncomplicated: Secondary | ICD-10-CM | POA: Insufficient documentation

## 2018-02-11 DIAGNOSIS — R05 Cough: Secondary | ICD-10-CM | POA: Insufficient documentation

## 2018-02-11 DIAGNOSIS — Z7982 Long term (current) use of aspirin: Secondary | ICD-10-CM | POA: Insufficient documentation

## 2018-02-11 DIAGNOSIS — L03115 Cellulitis of right lower limb: Secondary | ICD-10-CM | POA: Insufficient documentation

## 2018-02-11 DIAGNOSIS — R0989 Other specified symptoms and signs involving the circulatory and respiratory systems: Secondary | ICD-10-CM | POA: Insufficient documentation

## 2018-02-11 DIAGNOSIS — M7989 Other specified soft tissue disorders: Secondary | ICD-10-CM

## 2018-02-11 DIAGNOSIS — M79605 Pain in left leg: Secondary | ICD-10-CM | POA: Insufficient documentation

## 2018-02-11 DIAGNOSIS — Q800 Ichthyosis vulgaris: Secondary | ICD-10-CM | POA: Insufficient documentation

## 2018-02-11 LAB — CBC WITH DIFFERENTIAL/PLATELET
BASOS ABS: 0 10*3/uL (ref 0.0–0.1)
Basophils Relative: 0 %
Eosinophils Absolute: 0.2 10*3/uL (ref 0.0–0.7)
Eosinophils Relative: 2 %
HEMATOCRIT: 44.1 % (ref 39.0–52.0)
HEMOGLOBIN: 15.5 g/dL (ref 13.0–17.0)
LYMPHS PCT: 21 %
Lymphs Abs: 1.8 10*3/uL (ref 0.7–4.0)
MCH: 32.9 pg (ref 26.0–34.0)
MCHC: 35.1 g/dL (ref 30.0–36.0)
MCV: 93.6 fL (ref 78.0–100.0)
MONO ABS: 1 10*3/uL (ref 0.1–1.0)
MONOS PCT: 11 %
NEUTROS ABS: 5.5 10*3/uL (ref 1.7–7.7)
Neutrophils Relative %: 66 %
Platelets: 361 10*3/uL (ref 150–400)
RBC: 4.71 MIL/uL (ref 4.22–5.81)
RDW: 13 % (ref 11.5–15.5)
WBC: 8.4 10*3/uL (ref 4.0–10.5)

## 2018-02-11 LAB — C-REACTIVE PROTEIN: CRP: 1.8 mg/dL — ABNORMAL HIGH (ref <1.0)

## 2018-02-11 LAB — HEPATIC FUNCTION PANEL
ALK PHOS: 81 U/L (ref 38–126)
ALT: 21 U/L (ref 0–44)
AST: 27 U/L (ref 15–41)
Albumin: 3.4 g/dL — ABNORMAL LOW (ref 3.5–5.0)
Bilirubin, Direct: 0.2 mg/dL (ref 0.0–0.2)
Indirect Bilirubin: 0.6 mg/dL (ref 0.3–0.9)
TOTAL PROTEIN: 6.6 g/dL (ref 6.5–8.1)
Total Bilirubin: 0.8 mg/dL (ref 0.3–1.2)

## 2018-02-11 LAB — BASIC METABOLIC PANEL
Anion gap: 10 (ref 5–15)
BUN: 11 mg/dL (ref 6–20)
CO2: 26 mmol/L (ref 22–32)
Calcium: 8.8 mg/dL — ABNORMAL LOW (ref 8.9–10.3)
Chloride: 101 mmol/L (ref 98–111)
Creatinine, Ser: 1.12 mg/dL (ref 0.61–1.24)
GFR calc Af Amer: >60 mL/min (ref >60)
GFR calc non Af Amer: >60 mL/min (ref >60)
GLUCOSE: 88 mg/dL (ref 70–99)
POTASSIUM: 3.2 mmol/L — AB (ref 3.5–5.1)
Sodium: 137 mmol/L (ref 135–145)

## 2018-02-11 LAB — SEDIMENTATION RATE: Sed Rate: 22 mm/hr — ABNORMAL HIGH (ref 0–16)

## 2018-02-11 LAB — BRAIN NATRIURETIC PEPTIDE: B Natriuretic Peptide: 23.3 pg/mL (ref 0.0–100.0)

## 2018-02-11 MED ORDER — HYDROXYZINE HCL 25 MG PO TABS: 25.0000 mg | ORAL_TABLET | Freq: Three times a day (TID) | ORAL | 0 refills | Status: DC | PRN

## 2018-02-11 MED ORDER — MELOXICAM 7.5 MG PO TABS: 7.5000 mg | ORAL_TABLET | Freq: Every day | ORAL | 0 refills | Status: DC

## 2018-02-11 MED ORDER — MELOXICAM 7.5 MG PO TABS: 7.5000 mg | ORAL_TABLET | Freq: Every day | ORAL | 0 refills | Status: AC

## 2018-02-11 MED ORDER — HYDROXYZINE HCL 25 MG PO TABS
25.0000 mg | ORAL_TABLET | Freq: Once | ORAL | Status: AC
  Administered 2018-02-11: 25 mg via ORAL
  Filled 2018-02-11: qty 1

## 2018-02-11 MED ORDER — CEPHALEXIN 500 MG PO CAPS: 500.0000 mg | ORAL_CAPSULE | Freq: Four times a day (QID) | ORAL | 0 refills | Status: AC

## 2018-02-11 MED ORDER — CEPHALEXIN 500 MG PO CAPS: 500.0000 mg | ORAL_CAPSULE | Freq: Four times a day (QID) | ORAL | 0 refills | Status: DC

## 2018-02-11 NOTE — ED Provider Notes (Signed)
[facility] DEPT Provider Note   CSN: [id number] Arrival date & time: 02/11/18  4098     History   Chief Complaint Chief Complaint  Patient presents with  . Leg Pain  . Foot Pain    HPI Erik FLOREK is a 47 y.o. male.  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 Denies any falls or injuries. Patient has a history of cellulitis in his legs previously. No other complaints or concerns.      Past Medical History:  Diagnosis Date  . Cellulitis 12/18/2012   left foot  . Varicose veins     Patient Active Problem List   Diagnosis Date Noted  . Varicose veins of lower extremities with other complications 11/91/4782  . Stasis dermatitis of both legs 07/22/2013  . Cellulitis of leg, left 03/28/2013  . 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 Severe obesity (BMI >= 40) ([place]) 03/28/2013  . Depression 12/05/2010  . TOBACCO USER 03/28/2009    Past Surgical History:  Procedure Laterality Date  . CHOLECYSTECTOMY    . ENDOVENOUS ABLATION SAPHENOUS VEIN W/ LASER Left 09-30-2013   left greater saphenous vein        Home Medications    Prior to Admission medications   Medication Sig Start Date End Date Taking? Authorizing Provider  aspirin 325 MG tablet Take 325 mg by mouth daily.   Yes [provider]  ibuprofen (ADVIL,MOTRIN) 200 MG tablet Take 400 mg by mouth every 6 (six) hours as needed for moderate pain.   Yes [provider]  cephALEXin (KEFLEX) 500 MG capsule Take 1 capsule (500 mg total) by mouth 4 (four) times daily for 10 days. 02/11/18 02/21/18  Tacy Learn, PA-C  hydrOXYzine (ATARAX/VISTARIL) 25 MG tablet Take 1 tablet (25 mg total) by mouth every 8 (eight) hours as needed for itching. 02/11/18   Tacy Learn, PA-C  meloxicam (MOBIC) 7.5 MG tablet Take 1 tablet (7.5 mg total) by  mouth daily for 10 days. 02/11/18 02/21/18  Tacy Learn, PA-C  albuterol (PROVENTIL HFA;VENTOLIN HFA) 108 (90 BASE) MCG/ACT inhaler Inhale 2 puffs into the lungs every 6 (six) hours as needed for wheezing. 01/12/11 09/05/11  Cletus Gash, MD    Family History Family History  Problem Relation Age of Onset  . Varicose Veins Mother   . Heart disease Mother   . Diabetes Mother   . Arthritis Mother   . Hypertension Mother   . Stroke Father   . Heart disease Father     Social History Social History   Tobacco Use  . Smoking status: Current Every Day Smoker    Packs/day: 0.15    Types: Cigarettes  . Smokeless tobacco: Never Used  Substance Use Topics  . Alcohol use: No  . Drug use: No     Allergies   Patient has no known allergies.   Review of Systems Review of Systems  Constitutional: Negative for chills and fever.  Respiratory: Positive for cough. Negative for shortness of breath and wheezing.   Cardiovascular: Positive for leg swelling. Negative for chest pain.  Gastrointestinal: Negative for nausea and vomiting.  Musculoskeletal: Positive for arthralgias and myalgias. Negative for back pain, gait problem and joint swelling.  Skin: Positive for color change and rash. Negative for wound.  Neurological: Negative for weakness and numbness.  Hematological:  Does not bruise/bleed easily.  Psychiatric/Behavioral: Negative for confusion.  All other systems reviewed and are negative.    Physical Exam Updated Vital Signs BP (!) 155/106   Pulse 93   Temp 98 F (36.7 C) (Oral)   Resp 18   Ht 6\' 2"  (1.88 m)   Wt (!) 149.7 kg   SpO2 98%   BMI 42.37 kg/m   Physical Exam  Constitutional: He is oriented to person, place, and time. He appears well-developed and well-nourished. No distress.  HENT:  Head: Normocephalic and atraumatic.  Eyes: Conjunctivae are normal.  Neck: Neck supple.  Cardiovascular: Normal rate, regular rhythm, normal heart sounds and intact distal  pulses.  No murmur heard. Pulmonary/Chest: Effort normal. He has rales in the right lower field and the left lower field.  Abdominal: Soft. He exhibits no distension. There is no tenderness.  Musculoskeletal: He exhibits tenderness.       Left hip: He exhibits tenderness. He exhibits normal range of motion.       Lumbar back: He exhibits no tenderness and no bony tenderness.       Right foot: There is tenderness.       Left foot: There is tenderness.  Neurological: He is alert and oriented to person, place, and time.  Skin: Skin is warm and dry. He is not diaphoretic. There is erythema.     Psychiatric: He has a normal mood and affect. His behavior is normal.  Nursing note and vitals reviewed.    ED Treatments / Results  Labs (all labs ordered are listed, but only abnormal results are displayed) Labs Reviewed  BASIC METABOLIC PANEL - Abnormal; Notable for the following components:      Result Value   Potassium 3.2 (*)    Calcium 8.8 (*)    All other components within normal limits  SEDIMENTATION RATE - Abnormal; Notable for the following components:   Sed Rate 22 (*)    All other components within normal limits  HEPATIC FUNCTION PANEL - Abnormal; Notable for the following components:   Albumin 3.4 (*)    All other components within normal limits  CBC WITH DIFFERENTIAL/PLATELET  BRAIN NATRIURETIC PEPTIDE  C-REACTIVE PROTEIN    EKG None  Radiology Dg Chest 2 View  Result Date: 02/11/2018 CLINICAL DATA:  Cellulitis, pain EXAM: CHEST - 2 VIEW COMPARISON:  12/27/2014 chest radiograph. FINDINGS: Stable cardiomediastinal silhouette with normal heart size. No pneumothorax. No pleural effusion. Lungs appear clear, with no acute consolidative airspace disease and no pulmonary edema. IMPRESSION: No active cardiopulmonary disease. Electronically Signed   By: Ilona Sorrel M.D.   On: 02/11/2018 08:38   Dg Foot Complete Left  Result Date: 02/11/2018 CLINICAL DATA:  Pain, cellulitis,  no reported injury EXAM: LEFT FOOT - COMPLETE 3+ VIEW COMPARISON:  05/28/2013 left foot radiographs FINDINGS: Mild diffuse soft tissue swelling. No radiopaque foreign body. No fracture or dislocation. No suspicious focal osseous lesion. No significant arthropathy. No osseous erosions. IMPRESSION: Mild diffuse soft tissue swelling without osseous abnormality. Electronically Signed   By: Ilona Sorrel M.D.   On: 02/11/2018 08:40   Dg Foot Complete Right  Result Date: 02/11/2018 CLINICAL DATA:  Pain, cellulitis, no reported injury EXAM: RIGHT FOOT COMPLETE - 3+ VIEW COMPARISON:  None. FINDINGS: Diffuse soft tissue swelling. No fracture or dislocation. No suspicious focal osseous lesion. No osseous erosions. No significant arthropathy. No radiopaque foreign body. IMPRESSION: Diffuse soft tissue swelling with no acute osseous abnormality. Electronically Signed   By: Corene Cornea  A Poff M.D.   On: 02/11/2018 08:41    Procedures Procedures (including critical care time)  Medications Ordered in ED Medications  hydrOXYzine (ATARAX/VISTARIL) tablet 25 mg (25 mg Oral Given 02/11/18 1231)     Initial Impression / Assessment and Plan / ED Course  I have reviewed the triage vital signs and the nursing notes.  Pertinent labs & imaging results that were available during my care of the patient were reviewed by me and considered in my medical decision making (see chart for details).  Clinical Course as of Feb 11 1329  Sun Feb 11, 2018  1055 Venous doppler RLE is negative for obvious DVT, limited study.   [LM]  5081 47 year old male with chronic medical problems presents with complaint of worsening swelling in his right lower leg with warmth and redness on the right lower leg as well as pain in his feet and left hip.  Patient has a wet nonproductive cough which he attributes to his smoking.  On exam patient has mild rales bilateral bases, chronic skin changes consistent with history with erythema to the right lower leg  with small areas of bleeding, dried, from patient's scratching.  Concern for cellulitis secondary to the itching.  Patient was also seen today by Dr. Sedonia Small, ER attending.  ER work-up includes chest x-ray which is unremarkable, no cardiomegaly or effusion.  BNP is normal.  CBC normal, BMP with very mild hypokalemia at 3.2.  Hepatic function unremarkable, CRP pending (patient does not want to wait any longer for this test) sees.  Sed rate is mildly elevated at 22.  Venous Doppler was negative for DVT.  Patient be given Keflex for cellulitis of the leg, meloxicam for his pain, requests Atarax for his itching, prescription given for this as well.  Patient does not have a PCP, community resource follow-up given, return to ER for any worsening or concerning symptoms.   [LM]    Clinical Course User Index [LM] Tacy Learn, PA-C   Final Clinical Impressions(s) / ED Diagnoses   Final diagnoses:  Cellulitis of right lower extremity  Pain in both lower extremities    ED Discharge Orders         Ordered    cephALEXin (KEFLEX) 500 MG capsule  4 times daily,   Status:  Discontinued     02/11/18 1248    hydrOXYzine (ATARAX/VISTARIL) 25 MG tablet  Every 8 hours PRN,   Status:  Discontinued     02/11/18 1248    meloxicam (MOBIC) 7.5 MG tablet  Daily,   Status:  Discontinued     02/11/18 1248    cephALEXin (KEFLEX) 500 MG capsule  4 times daily     02/11/18 1319    hydrOXYzine (ATARAX/VISTARIL) 25 MG tablet  Every 8 hours PRN     02/11/18 1319    meloxicam (MOBIC) 7.5 MG tablet  Daily     02/11/18 1319           Tacy Learn, PA-C 02/11/18 1330    Maudie Flakes, MD 02/11/18 1404

## 2018-02-11 NOTE — Discharge Instructions (Addendum)
Take Keflex as prescribed and complete the full course.  Take meloxicam as needed for pain.  Take Atarax as needed for itching. Referral given for community resource follow-up.  Return to ER for worsening or concerning symptoms.

## 2018-02-11 NOTE — Progress Notes (Signed)
Right lower extremity venous duplex has been completed. Negative for obvious evidence of DVT. Results were given to Suella Broad PA.  02/11/18 10:59 AM Carlos Levering RVT

## 2018-02-11 NOTE — ED Triage Notes (Signed)
Patient here from home with complaints of bilateral leg and feet pain. Ambulatory but with difficulty. Ibuprofen with no relief.

## 2020-04-20 ENCOUNTER — Telehealth: Payer: Self-pay | Admitting: Family

## 2020-04-20 DIAGNOSIS — R300 Dysuria: Secondary | ICD-10-CM

## 2020-04-20 NOTE — Progress Notes (Signed)
Based on what you shared with me, I feel your condition warrants further evaluation and I recommend that you be seen for a face to face office visit.  Male bladder infections are not very common.  We worry about prostate or kidney conditions.  The standard of care is to examine the abdomen and kidneys, and to do a urine and blood test to make sure that something more serious is not going on.  We recommend that you see a provider today.  If your doctor's office is closed [facility] has the following Urgent Cares:    NOTE: If you entered your credit card information for this eVisit, you will not be charged. You may see a "hold" on your card for the $35 but that hold will drop off and you will not have a charge processed.   If you are having a true medical emergency please call 911.     For an urgent face to face visit, [facility] has four urgent care centers for your convenience:    NEW:  [facility] [address] [place], [place] [address] .  Monday - Friday 10 am - 6 pm    . [facility]    [phone]                  Get Driving Directions  [address] [place], [place] [address] . 10 am to 8 pm Monday-Friday . 12 pm to 8 pm Saturday-Sunday   . [facility] at [place]                  Get Driving Directions  [address], [place], [place] [address] . 8 am to 8 pm Monday-Friday . 9 am to 6 pm Saturday . 11 am to 6 pm Sunday   . [facility] at [place]                  Get Driving Directions   [address], [place] [address] . 8 am to 8 pm Monday-Friday . 8 am to 4 pm Saturday-Sunday    . [facility]gent [facility] at [place]                    Get Driving Directions  [phone]  [address], [place],  [address]  . Monday-Friday, 12 PM to 6 PM    Your e-visit  answers were reviewed by a board certified advanced clinical practitioner to complete your personal care plan.  Thank you for using e-Visits.

## 2020-05-27 ENCOUNTER — Telehealth: Payer: Self-pay | Admitting: Family

## 2020-05-27 DIAGNOSIS — L03119 Cellulitis of unspecified part of limb: Secondary | ICD-10-CM

## 2020-05-27 MED ORDER — CEPHALEXIN 500 MG PO CAPS: 500.0000 mg | ORAL_CAPSULE | Freq: Three times a day (TID) | ORAL | 0 refills | Status: DC

## 2020-05-27 NOTE — Addendum Note (Signed)
Addended by: Evelina Dun A on: 05/27/2020 08:13 PM   Modules accepted: Orders

## 2020-05-27 NOTE — Progress Notes (Signed)
E Visit for Cellulitis  We are sorry that you are not feeling well. Here is how we plan to help!  Based on what you shared with me it looks like you have cellulitis.  Cellulitis looks like areas of skin redness, swelling, and warmth; it develops as a result of bacteria entering under the skin. Little red spots and/or bleeding can be seen in skin, and tiny surface sacs containing fluid can occur. Fever can be present. Cellulitis is almost always on one side of a body, and the lower limbs are the most common site of involvement.   I have prescribed:  Keflex 500mg  take one by mouth three times a day for 10 days.  I will go ahead and send you an antibiotic. However you do need a follow up with your PCP within the next week or need to be seen at the urgent care if your legs worsen.  HOME CARE:  . Take your medications as ordered and take all of them, even if the skin irritation appears to be healing.   GET HELP RIGHT AWAY IF:  . Symptoms that don't begin to go away within 48 hours. . Severe redness persists or worsens . If the area turns color, spreads or swells. . If it blisters and opens, develops yellow-brown crust or bleeds. . You develop a fever or chills. . If the pain increases or becomes unbearable.  . Are unable to keep fluids and food down.  MAKE SURE YOU    Understand these instructions.  Will watch your condition.  Will get help right away if you are not doing well or get worse.  Thank you for choosing an e-visit. Your e-visit answers were reviewed by a board certified advanced clinical practitioner to complete your personal care plan. Depending upon the condition, your plan could have included both over the counter or prescription medications. Please review your pharmacy choice. Make sure the pharmacy is open so you can pick up prescription now. If there is a problem, you may contact your provider through [employer] and have the prescription routed to another  pharmacy. Your safety is important to [place]. If you have drug allergies check your prescription carefully.  For the next 24 hours you can use MyChart to ask questions about today's visit, request a non-urgent call back, or ask for a work or school excuse. You will get an email in the next two days asking about your experience. I hope that your e-visit has been valuable and will speed your recovery.

## 2020-05-27 NOTE — Progress Notes (Signed)
Based on what you shared with me, I feel your condition warrants further evaluation and I recommend that you be seen for a face to face office visit.    Given your rash and pain you need to be seen face-to-face.  NOTE: If you entered your credit card information for this eVisit, you will not be charged. You may see a "hold" on your card for the $35 but that hold will drop off and you will not have a charge processed.   If you are having a true medical emergency please call 911.      For an urgent face to face visit, [place] has five urgent care centers for your convenience:     [facility] [place] [facility] [place] Get Driving Directions [phone] [place], [place] [address] . 10 am - 6pm Monday - Friday    [facility] [place] [facility]) Get Driving Directions [phone] [address] [place], [place] [address] . 10 am to 8 pm Monday-Friday . 12 pm to 8 pm [facility] Get Driving Directions [phone] [facility] [place], [place], [place] [address] . 8 am to 8 pm Monday-Friday . 9 am to 6 pm Saturday . 11 am to 6 pm Sunday     [facility] at [facility] Get Driving Directions  [phone] [address] [place], [place] [address] . 8 am to 8 pm Monday-Friday . 8 am to 4 pm [facility] at [place] Get Driving Directions [phone] [address] [place], [place] [address] . 12 pm to 6 pm Monday-Friday      Your e-visit answers were reviewed by a board certified advanced clinical practitioner to complete your personal care plan.  Thank you for using e-Visits.

## 2020-06-16 ENCOUNTER — Other Ambulatory Visit: Payer: Self-pay | Admitting: Family

## 2020-06-16 NOTE — Progress Notes (Signed)
Approximately 5 minutes was spent documenting and reviewing patient's chart.   

## 2021-05-12 ENCOUNTER — Telehealth: Payer: Self-pay

## 2021-05-12 ENCOUNTER — Ambulatory Visit
Admission: RE | Admit: 2021-05-12 | Discharge: 2021-05-12 | Disposition: A | Discharge status: Home / Self Care | Payer: Medicaid Other | Source: Ambulatory Visit | Attending: Emergency Medicine | Admitting: Emergency Medicine

## 2021-05-12 ENCOUNTER — Other Ambulatory Visit: Payer: Self-pay

## 2021-05-12 VITALS — BP 146/86 | HR 84 | Temp 98.7°F | Resp 18

## 2021-05-12 DIAGNOSIS — R030 Elevated blood-pressure reading, without diagnosis of hypertension: Secondary | ICD-10-CM

## 2021-05-12 MED ORDER — AMLODIPINE BESYLATE 5 MG PO TABS: 5.0000 mg | ORAL_TABLET | Freq: Every morning | ORAL | 0 refills | Status: DC

## 2021-05-12 MED ORDER — HYDROCHLOROTHIAZIDE 25 MG PO TABS: 25.0000 mg | ORAL_TABLET | Freq: Every morning | ORAL | 0 refills | Status: DC

## 2021-05-12 NOTE — ED Triage Notes (Signed)
Patient presents to [facility] for evaluation of 2-3 weeks of head pounding and "flushing" sound in his ear with some dizziness, which prompted him to start checking his blood pressure, and has had readings of greater than 227 systolic, mostly 737H over 110s this week.  Patient states if he lays down it seems to get better.  The flushing/dizziness episodes last approx 10-30 seconds.

## 2021-05-12 NOTE — ED Provider Notes (Signed)
[facility]    CSN: [id number] Arrival date & time: 05/12/21  1220    HISTORY   Chief Complaint  Patient presents with   APPT 1230   Hypertension   Headache   HPI Erik Howe is a 50 y.o. male. Patient presents to [facility] for evaluation of 2-3 weeks of head pounding and "flushing" sound in his ear with some dizziness, which prompted him to start checking his blood pressure, and has had readings of greater than 102 systolic, mostly 585I over 110s this week.  Patient states if he lays down it seems to get better.  The flushing/dizziness episodes last approx 10-30 seconds.  Blood pressure on arrival to urgent care today was 146/86, normal heart rate, normal respirations, normal temperature.  The history is provided by the patient.  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 Authorizing Provider  amLODipine (NORVASC) 5 MG tablet Take 1 tablet (5 mg total) by mouth in the morning. 05/12/21 06/11/21 Yes Lynden Oxford Scales, PA-C  hydrochlorothiazide (HYDRODIURIL) 25 MG tablet Take 1 tablet (25 mg total) by mouth in the morning. 05/12/21 06/11/21 Yes Lynden Oxford Scales, PA-C  aspirin 325 MG tablet Take 325 mg by mouth daily.    [provider]  cephALEXin (KEFLEX) 500 MG capsule Take 1 capsule (500 mg total) by  mouth 3 (three) times daily. 05/27/20   Sharion Balloon, FNP  hydrOXYzine (ATARAX/VISTARIL) 25 MG tablet Take 1 tablet (25 mg total) by mouth every 8 (eight) hours as needed for itching. 02/11/18   Tacy Learn, PA-C  ibuprofen (ADVIL,MOTRIN) 200 MG tablet Take 400 mg by mouth every 6 (six) hours as needed for moderate pain.    [provider]  albuterol (PROVENTIL HFA;VENTOLIN HFA) 108 (90 BASE) MCG/ACT inhaler Inhale 2 puffs into the lungs every 6 (six) hours as needed for wheezing. 01/12/11 09/05/11  Cletus Gash, MD   Family History Family History  Problem Relation Age of Onset   Varicose Veins Mother    Heart disease Mother    Diabetes Mother    Arthritis Mother    Hypertension Mother    Stroke Father    Heart disease Father    Social History Social History   Tobacco Use   Smoking status: Every Day    Packs/day: 0.15    Types: Cigarettes   Smokeless tobacco: Never  Substance Use Topics   Alcohol use: No   Drug use: No   Allergies   Patient has no known allergies.  Review of Systems Review of Systems Pertinent findings noted in history of present illness.   Physical Exam Triage Vital Signs ED Triage Vitals  Enc Vitals Group     BP 04/16/21 0827 (!) 147/82     Pulse Rate 04/16/21 0827 72  Resp 04/16/21 0827 18     Temp 04/16/21 0827 98.3 F (36.8 C)     Temp Source 04/16/21 0827 Oral     SpO2 04/16/21 0827 98 %     Weight --      Height --      Head Circumference --      Peak Flow --      Pain Score 04/16/21 0826 5     Pain Loc --      Pain Edu? --      Excl. in [name]? --   No data found.  Updated Vital Signs BP (!) 146/86 (BP Location: Right Arm)   Pulse 84   Temp 98.7 F (37.1 C) (Oral)   Resp 18   SpO2 96%   Physical Exam Vitals and nursing note reviewed.  Constitutional:      General: He is not in acute distress.    Appearance: Normal appearance. He is obese. He is not ill-appearing.  HENT:     Head: Normocephalic and  atraumatic.  Eyes:     General: Lids are normal.        Right eye: No discharge.        Left eye: No discharge.     Extraocular Movements: Extraocular movements intact.     Conjunctiva/sclera: Conjunctivae normal.     Right eye: Right conjunctiva is not injected.     Left eye: Left conjunctiva is not injected.  Neck:     Trachea: Trachea and phonation normal.  Cardiovascular:     Rate and Rhythm: Normal rate and regular rhythm.     Pulses: Normal pulses.     Heart sounds: Normal heart sounds. No murmur heard.   No friction rub. No gallop.  Pulmonary:     Effort: Pulmonary effort is normal. No accessory muscle usage, prolonged expiration or respiratory distress.     Breath sounds: Normal breath sounds. No stridor, decreased air movement or transmitted upper airway sounds. No decreased breath sounds, wheezing, rhonchi or rales.  Chest:     Chest wall: No tenderness.  Musculoskeletal:        General: Normal range of motion.     Cervical back: Normal range of motion and neck supple. Normal range of motion.  Lymphadenopathy:     Cervical: No cervical adenopathy.  Skin:    General: Skin is warm and dry.     Findings: No erythema or rash.  Neurological:     General: No focal deficit present.     Mental Status: He is alert and oriented to person, place, and time.  Psychiatric:        Mood and Affect: Mood normal.        Behavior: Behavior normal.    Visual Acuity Right Eye Distance:   Left Eye Distance:   Bilateral Distance:    Right Eye Near:   Left Eye Near:    Bilateral Near:     UC Couse / Diagnostics / Procedures:    EKG  Radiology No results found.  Procedures Procedures (including critical care time)  UC Diagnoses / Final Clinical Impressions(s)    Final diagnoses:  Elevated blood pressure reading without diagnosis of hypertension   I have reviewed the triage vital signs and the nursing notes.  Pertinent labs & imaging results that were available during  my care of the patient were reviewed by me and considered in my medical decision making (see chart for details).    Patient provided with prescriptions  for hydrochlorothiazide and amlodipine.  Smoking cessation encouraged.  Patient given contact information for local [facility] so that he can follow-up for his hypertension.  Patient/parent/caregiver verbalized understanding and agreement of plan as discussed.  All questions were addressed during visit.  Please see discharge instructions below for further details of plan.  ED Prescriptions     Medication Sig Dispense Auth. Provider   hydrochlorothiazide (HYDRODIURIL) 25 MG tablet Take 1 tablet (25 mg total) by mouth in the morning. 30 tablet Lynden Oxford Scales, PA-C   amLODipine (NORVASC) 5 MG tablet Take 1 tablet (5 mg total) by mouth in the morning. 30 tablet Lynden Oxford Scales, PA-C      PDMP not reviewed this encounter.  Pending results:  Labs Reviewed - No data to display   Medications Ordered in UC: Medications - No data to display  Discharge Instructions:   Discharge Instructions      For your elevated blood pressure, please begin hydrochlorothiazide which is a fluid pill and amlodipine which is a calcium channel blocker.  Together these medications should nicely reduce your blood pressure.  In order to make sure that you have reached your goal of 130/80, please follow-up with a provider at [facility], I provided you with contact information for their office.  As we discussed, they are federally [place] and see all patients regardless of their ability to pay.      Disposition Upon Discharge:  Patient presented with an acute illness with associated systemic symptoms and significant discomfort requiring urgent management. In my opinion, this is a condition that a prudent lay person (someone who possesses an average knowledge of health and medicine) may potentially expect to result in  complications if not addressed urgently such as respiratory distress, impairment of bodily function or dysfunction of bodily organs.   Routine symptom specific, illness specific and/or disease specific instructions were discussed with the patient and/or caregiver at length.   As such, the patient has been evaluated and assessed, work-up was performed and treatment was provided in alignment with urgent care protocols and evidence based medicine.  Patient/parent/caregiver has been advised that the patient may require follow up for further testing and treatment if the symptoms continue in spite of treatment, as clinically indicated and appropriate.  If the patient was tested for COVID-19, Influenza and/or RSV, then the patient/parent/guardian was advised to isolate at home pending the results of his/her diagnostic coronavirus test and potentially longer if they're positive. I have also advised pt that if his/her COVID-19 test returns positive, it's recommended to self-isolate for at least 10 days after symptoms first appeared AND until fever-free for 24 hours without fever reducer AND other symptoms have improved or resolved. Discussed self-isolation recommendations as well as instructions for household member/close contacts as per the [facility] and [place] DHHS, and also gave patient the [place] packet with this information.  Patient/parent/caregiver has been advised to return to the [facility] or PCP in 3-5 days if no better; to PCP or the Emergency Department if new signs and symptoms develop, or if the current signs or symptoms continue to change or worsen for further workup, evaluation and treatment as clinically indicated and appropriate  The patient will follow up with their current PCP if and as advised. If the patient does not currently have a PCP we will assist them in obtaining one.   The patient may need specialty follow up if the symptoms continue, in spite of conservative treatment and management, for further  workup, evaluation, consultation and treatment as clinically indicated and appropriate.  Condition: stable for discharge home Home: take medications as prescribed; routine discharge instructions as discussed; follow up as advised.    Lynden Oxford Scales, PA-C 05/12/21 1306

## 2021-05-12 NOTE — Discharge Instructions (Addendum)
For your elevated blood pressure, please begin hydrochlorothiazide which is a fluid pill and amlodipine which is a calcium channel blocker.  Together these medications should nicely reduce your blood pressure.  In order to make sure that you have reached your goal of 130/80, please follow-up with a provider at [facility], I provided you with contact information for their office.  As we discussed, they are federally [place] and see all patients regardless of their ability to pay.

## 2021-09-17 ENCOUNTER — Other Ambulatory Visit: Payer: Self-pay

## 2021-09-17 MED ORDER — IBUPROFEN 800 MG PO TABS
ORAL_TABLET | ORAL | 5 refills | Status: DC
  Filled 2021-09-17: qty 90, 30d supply, fill #0

## 2021-09-17 MED ORDER — TRAZODONE HCL 100 MG PO TABS
ORAL_TABLET | ORAL | 1 refills | Status: DC
  Filled 2021-09-17: qty 30, 30d supply, fill #0

## 2021-09-17 MED ORDER — TRIAMCINOLONE ACETONIDE 0.1 % EX OINT
TOPICAL_OINTMENT | CUTANEOUS | 5 refills | Status: DC
  Filled 2021-09-17: qty 454, 30d supply, fill #0

## 2021-09-20 ENCOUNTER — Other Ambulatory Visit: Payer: Self-pay

## 2021-09-21 ENCOUNTER — Other Ambulatory Visit: Payer: Self-pay

## 2021-09-23 ENCOUNTER — Other Ambulatory Visit: Payer: Self-pay | Admitting: Family Medicine

## 2021-09-23 ENCOUNTER — Ambulatory Visit
Admission: RE | Admit: 2021-09-23 | Discharge: 2021-09-23 | Disposition: A | Discharge status: Home / Self Care | Payer: No Typology Code available for payment source | Source: Ambulatory Visit | Attending: Family Medicine | Admitting: Family Medicine

## 2021-09-23 DIAGNOSIS — M25552 Pain in left hip: Secondary | ICD-10-CM

## 2021-09-23 DIAGNOSIS — M25562 Pain in left knee: Secondary | ICD-10-CM

## 2021-09-24 ENCOUNTER — Other Ambulatory Visit: Payer: Self-pay

## 2021-09-28 ENCOUNTER — Other Ambulatory Visit: Payer: Self-pay

## 2021-10-13 ENCOUNTER — Other Ambulatory Visit (HOSPITAL_COMMUNITY): Payer: Self-pay

## 2021-10-13 ENCOUNTER — Other Ambulatory Visit: Payer: Self-pay

## 2021-10-13 MED ORDER — HYDROXYZINE HCL 25 MG PO TABS
ORAL_TABLET | ORAL | 5 refills | Status: DC
  Filled 2021-10-13: qty 90, 30d supply, fill #0

## 2021-10-14 ENCOUNTER — Other Ambulatory Visit: Payer: Self-pay

## 2021-10-18 ENCOUNTER — Other Ambulatory Visit: Payer: Self-pay

## 2021-11-19 ENCOUNTER — Encounter: Payer: Self-pay | Admitting: Gastroenterology

## 2021-12-15 ENCOUNTER — Other Ambulatory Visit: Payer: Self-pay

## 2021-12-15 MED ORDER — PAROXETINE HCL 20 MG PO TABS
20.0000 mg | ORAL_TABLET | Freq: Every morning | ORAL | 1 refills | Status: DC
  Filled 2021-12-15: qty 30, 30d supply, fill #0
  Filled 2022-01-17: qty 30, 30d supply, fill #1
  Filled 2022-02-17: qty 30, 30d supply, fill #2

## 2021-12-15 MED ORDER — CYCLOBENZAPRINE HCL 10 MG PO TABS
ORAL_TABLET | ORAL | 5 refills | Status: DC
  Filled 2021-12-15: qty 30, 30d supply, fill #0
  Filled 2022-01-17: qty 30, 30d supply, fill #1
  Filled 2022-02-17: qty 30, 30d supply, fill #2
  Filled 2022-04-11: qty 30, 30d supply, fill #3
  Filled 2022-06-02: qty 30, 30d supply, fill #4
  Filled 2022-07-07: qty 21, 21d supply, fill #5
  Filled 2022-08-18: qty 9, 9d supply, fill #6

## 2021-12-20 ENCOUNTER — Other Ambulatory Visit: Payer: Self-pay

## 2021-12-23 ENCOUNTER — Ambulatory Visit: Payer: No Typology Code available for payment source | Admitting: Gastroenterology

## 2022-01-17 ENCOUNTER — Other Ambulatory Visit: Payer: Self-pay

## 2022-01-20 ENCOUNTER — Other Ambulatory Visit: Payer: Self-pay

## 2022-02-17 ENCOUNTER — Other Ambulatory Visit: Payer: Self-pay

## 2022-02-18 ENCOUNTER — Other Ambulatory Visit: Payer: Self-pay

## 2022-03-10 ENCOUNTER — Other Ambulatory Visit: Payer: Self-pay | Admitting: Family Medicine

## 2022-03-10 ENCOUNTER — Emergency Department (HOSPITAL_COMMUNITY): Payer: No Typology Code available for payment source

## 2022-03-10 ENCOUNTER — Encounter (HOSPITAL_COMMUNITY): Payer: Self-pay

## 2022-03-10 ENCOUNTER — Ambulatory Visit
Admission: RE | Admit: 2022-03-10 | Discharge: 2022-03-10 | Disposition: A | Discharge status: Home / Self Care | Payer: No Typology Code available for payment source | Source: Ambulatory Visit | Attending: Family Medicine | Admitting: Family Medicine

## 2022-03-10 ENCOUNTER — Other Ambulatory Visit: Payer: Self-pay

## 2022-03-10 ENCOUNTER — Emergency Department (HOSPITAL_COMMUNITY)
Admission: EM | Admit: 2022-03-10 | Discharge: 2022-03-11 | Disposition: A | Discharge status: Home / Self Care | Payer: No Typology Code available for payment source | Attending: Emergency Medicine | Admitting: Emergency Medicine

## 2022-03-10 DIAGNOSIS — R1084 Generalized abdominal pain: Secondary | ICD-10-CM

## 2022-03-10 DIAGNOSIS — K769 Liver disease, unspecified: Secondary | ICD-10-CM | POA: Insufficient documentation

## 2022-03-10 DIAGNOSIS — K529 Noninfective gastroenteritis and colitis, unspecified: Secondary | ICD-10-CM

## 2022-03-10 LAB — CBC WITH DIFFERENTIAL/PLATELET
Abs Immature Granulocytes: 0.02 10*3/uL (ref 0.00–0.07)
Basophils Absolute: 0.1 10*3/uL (ref 0.0–0.1)
Basophils Relative: 1 %
Eosinophils Absolute: 0.1 10*3/uL (ref 0.0–0.5)
Eosinophils Relative: 1 %
HCT: 40.6 % (ref 39.0–52.0)
Hemoglobin: 13.9 g/dL (ref 13.0–17.0)
Immature Granulocytes: 0 %
Lymphocytes Relative: 28 %
Lymphs Abs: 2.5 10*3/uL (ref 0.7–4.0)
MCH: 35.1 pg — ABNORMAL HIGH (ref 26.0–34.0)
MCHC: 34.2 g/dL (ref 30.0–36.0)
MCV: 102.5 fL — ABNORMAL HIGH (ref 80.0–100.0)
Monocytes Absolute: 0.9 10*3/uL (ref 0.1–1.0)
Monocytes Relative: 10 %
Neutro Abs: 5.6 10*3/uL (ref 1.7–7.7)
Neutrophils Relative %: 60 %
Platelets: 206 10*3/uL (ref 150–400)
RBC: 3.96 MIL/uL — ABNORMAL LOW (ref 4.22–5.81)
RDW: 14.2 % (ref 11.5–15.5)
WBC: 9.2 10*3/uL (ref 4.0–10.5)
nRBC: 0 % (ref 0.0–0.2)

## 2022-03-10 LAB — COMPREHENSIVE METABOLIC PANEL
ALT: 34 U/L (ref 0–44)
AST: 69 U/L — ABNORMAL HIGH (ref 15–41)
Albumin: 2.7 g/dL — ABNORMAL LOW (ref 3.5–5.0)
Alkaline Phosphatase: 205 U/L — ABNORMAL HIGH (ref 38–126)
Anion gap: 9 (ref 5–15)
BUN: 6 mg/dL (ref 6–20)
CO2: 21 mmol/L — ABNORMAL LOW (ref 22–32)
Calcium: 8 mg/dL — ABNORMAL LOW (ref 8.9–10.3)
Chloride: 104 mmol/L (ref 98–111)
Creatinine, Ser: 0.94 mg/dL (ref 0.61–1.24)
GFR, Estimated: >60 mL/min (ref >60)
Glucose, Bld: 97 mg/dL (ref 70–99)
Potassium: 3.5 mmol/L (ref 3.5–5.1)
Sodium: 134 mmol/L — ABNORMAL LOW (ref 135–145)
Total Bilirubin: 2.5 mg/dL — ABNORMAL HIGH (ref 0.3–1.2)
Total Protein: 6.3 g/dL — ABNORMAL LOW (ref 6.5–8.1)

## 2022-03-10 LAB — URINALYSIS, ROUTINE W REFLEX MICROSCOPIC
Bilirubin Urine: NEGATIVE
Glucose, UA: NEGATIVE mg/dL
Hgb urine dipstick: NEGATIVE
Ketones, ur: NEGATIVE mg/dL
Leukocytes,Ua: NEGATIVE
Nitrite: NEGATIVE
Protein, ur: 30 mg/dL — AB
Specific Gravity, Urine: 1.017 (ref 1.005–1.030)
pH: 6 (ref 5.0–8.0)

## 2022-03-10 LAB — LIPASE, BLOOD: Lipase: 41 U/L (ref 11–51)

## 2022-03-10 LAB — AMMONIA: Ammonia: 27 umol/L (ref 9–35)

## 2022-03-10 MED ORDER — LACTATED RINGERS IV BOLUS
1000.0000 mL | Freq: Once | INTRAVENOUS | Status: AC
  Administered 2022-03-10: 1000 mL via INTRAVENOUS

## 2022-03-10 MED ORDER — MORPHINE SULFATE (PF) 4 MG/ML IV SOLN
4.0000 mg | Freq: Once | INTRAVENOUS | Status: AC
  Administered 2022-03-10: 4 mg via INTRAVENOUS
  Filled 2022-03-10: qty 1

## 2022-03-10 MED ORDER — IOHEXOL 350 MG/ML SOLN
75.0000 mL | Freq: Once | INTRAVENOUS | Status: AC | PRN
  Administered 2022-03-10: 75 mL via INTRAVENOUS

## 2022-03-10 MED ORDER — ONDANSETRON HCL 4 MG/2ML IJ SOLN
4.0000 mg | Freq: Once | INTRAMUSCULAR | Status: AC
  Administered 2022-03-10: 4 mg via INTRAVENOUS
  Filled 2022-03-10: qty 2

## 2022-03-10 NOTE — ED Triage Notes (Signed)
Sent over due to abnormal liver enzymes and PCP thinks he is in uncompensated cirrohosis

## 2022-03-10 NOTE — ED Provider Triage Note (Signed)
Emergency Medicine Provider Triage Evaluation Note  Erik Howe , a 51 y.o. male  was evaluated in triage.  Pt complains of abdominal pain onset 1.5-2 weeks.  Noticed distention to the area.  Notes his abdominal pain is localized to the right upper quadrant epigastric region.  Was evaluated by his primary care provider and told to come to the emergency department after his labs show an elevated LFTs.  Patient also had ultrasound completed with his primary care provider (were not able to visualize that on our end).  Has associated shortness of breath due to pain.  Denies EtOH use.  Gallbladder is removed in 2007.  Denies chest pain, nausea, vomiting.   Per patient chart review: Patient has had elevated LFTs ongoing since March 2023.  Review of Systems  Positive:  Negative:   Physical Exam  BP 122/78 (BP Location: Right Arm)   Pulse (!) 111   Temp 98.6 F (37 C) (Oral)   Resp 18   Ht '6\' 2"'$  (1.88 m)   Wt (!) 149.7 kg   SpO2 100%   BMI 42.37 kg/m  Gen:   Awake, no distress   Resp:  Normal effort  MSK:   Moves extremities without difficulty  Other:  Abdominal distention noted on exam.  Diffuse abdominal tenderness to palpation.  Medical Decision Making  Medically screening exam initiated at 6:23 PM.  Appropriate orders placed.  Erik Howe was informed that the remainder of the evaluation will be completed by another provider, this initial triage assessment does not replace that evaluation, and the importance of remaining in the ED until their evaluation is complete.  6:36 PM - Discussed with RN that patient is in need of a room immediately. RN aware and working on room placement. Work-up initiated   Erik Howe A, PA-C 03/10/22 1836

## 2022-03-10 NOTE — ED Provider Notes (Signed)
[facility] EMERGENCY DEPARTMENT Provider Note   CSN: [id number] Arrival date & time: 03/10/22  1729     History Chief Complaint  Patient presents with   Abdominal Pain    HPI Erik Howe is a 51 y.o. male presenting for abdominal pain.  He has a minimal medical history but recently started following with a primary care provider in the outpatient setting secondary to weeks of abdominal pain with acute worsening.  Labs were concerning for development of cirrhosis.   He denies fevers or chills endorses nausea without vomiting is otherwise ambulatory tolerating p.o. intake. No known sick contacts.  He does feel distended in his abdomen.   Patient's recorded medical, surgical, social, medication list and allergies were reviewed in the Snapshot window as part of the initial history.   Review of Systems   Review of Systems  Constitutional:  Negative for chills and fever.  HENT:  Negative for ear pain and sore throat.   Eyes:  Negative for pain and visual disturbance.  Respiratory:  Negative for cough and shortness of breath.   Cardiovascular:  Negative for chest pain and palpitations.  Gastrointestinal:  Positive for abdominal pain and nausea. Negative for vomiting.  Genitourinary:  Negative for dysuria and hematuria.  Musculoskeletal:  Negative for arthralgias and back pain.  Skin:  Negative for color change and rash.  Neurological:  Negative for seizures and syncope.  All other systems reviewed and are negative.   Physical Exam Updated Vital Signs BP 105/72   Pulse 88   Temp 98.6 F (37 C) (Oral)   Resp 20   Ht '6\' 2"'$  (1.88 m)   Wt (!) 149.7 kg   SpO2 100%   BMI 42.37 kg/m  Physical Exam Vitals and nursing note reviewed.  Constitutional:      General: He is not in acute distress.    Appearance: He is well-developed.  HENT:     Head: Normocephalic and atraumatic.  Eyes:     Conjunctiva/sclera: Conjunctivae normal.  Cardiovascular:     Rate and  Rhythm: Normal rate and regular rhythm.     Heart sounds: No murmur heard. Pulmonary:     Effort: Pulmonary effort is normal. No respiratory distress.     Breath sounds: Normal breath sounds.  Abdominal:     Palpations: Abdomen is soft. There is shifting dullness and fluid wave.     Tenderness: There is abdominal tenderness. There is no right CVA tenderness, left CVA tenderness or guarding.  Musculoskeletal:        General: No swelling.     Cervical back: Neck supple.  Skin:    General: Skin is warm and dry.     Capillary Refill: Capillary refill takes less than 2 seconds.  Neurological:     Mental Status: He is alert.  Psychiatric:        Mood and Affect: Mood normal.      ED Course/ Medical Decision Making/ A&P    Procedures ABDOMINAL PARACENTESIS  Date/Time: 03/11/2022 12:14 AM  Performed by: Tretha Sciara, MD Authorized by: Tretha Sciara, MD  Consent: Verbal consent obtained. Written consent obtained. Risks and benefits: risks, benefits and alternatives were discussed Time out: Immediately prior to procedure a "time out" was called to verify the correct patient, procedure, equipment, support staff and site/side marked as required. Preparation: Patient was prepped and draped in the usual sterile fashion. Local anesthesia used: yes Anesthesia: local infiltration  Anesthesia: Local anesthesia used: yes Local Anesthetic: lidocaine  1% with epinephrine Patient tolerance: patient tolerated the procedure well with no immediate complications Comments: 2L Therapeutic      Medications Ordered in ED Medications  morphine (PF) 4 MG/ML injection 4 mg (4 mg Intravenous Given 03/10/22 2042)  lactated ringers bolus 1,000 mL (0 mLs Intravenous Stopped 03/10/22 2257)  ondansetron (ZOFRAN) injection 4 mg (4 mg Intravenous Given 03/10/22 2042)  iohexol (OMNIPAQUE) 350 MG/ML injection 75 mL (75 mLs Intravenous Contrast Given 03/10/22 2046)    Medical Decision Making:     [name] is a 51 y.o. male who presented to the ED today with multiple detailed above.     Patient's presentation is complicated by their history of obesity.  Patient placed on continuous vitals and telemetry monitoring while in ED which was reviewed periodically.   Complete initial physical exam performed, notably the patient  was HDS in NAD.      Reviewed and confirmed nursing documentation for past medical history, family history, social history.    Initial Assessment:   With the patient's presentation of abdominal pain, most likely diagnosis is large volume ascities. Other diagnoses were considered including (but not limited to) SBP. These are considered less likely due to history of present illness and physical exam findings.   This is most consistent with an acute life/limb threatening illness complicated by underlying chronic conditions.  Initial Plan:  Screening labs including CBC and Metabolic panel to evaluate for infectious or metabolic etiology of disease.  Urinalysis with reflex culture ordered to evaluate for UTI or relevant urologic/nephrologic pathology.  CXR to evaluate for structural/infectious intrathoracic pathology.  EKG to evaluate for cardiac pathology. CTA/P with contrast to evaluate for appendicitis and chole and pancreatitis Paracentesis for eval for SBP Objective evaluation as below reviewed with plan for close reassessment  Initial Study Results:   Laboratory  All laboratory results reviewed without evidence of clinically relevant pathology.   Concern for chronic liver disease with multiple metabolic disruptions. None emergent  EKG EKG was reviewed independently. Rate, rhythm, axis, intervals all examined and without medically relevant abnormality. ST segments without concerns for elevations.    Radiology  All images reviewed independently. Agree with radiology report at this time.   CT ABDOMEN PELVIS W CONTRAST  Result Date: 03/10/2022 CLINICAL  DATA:  Abdominal pain EXAM: CT ABDOMEN AND PELVIS WITH CONTRAST TECHNIQUE: Multidetector CT imaging of the abdomen and pelvis was performed using the standard protocol following bolus administration of intravenous contrast. RADIATION DOSE REDUCTION: This exam was performed according to the departmental dose-optimization program which includes automated exposure control, adjustment of the mA and/or kV according to patient size and/or use of iterative reconstruction technique. CONTRAST:  49m OMNIPAQUE IOHEXOL 350 MG/ML SOLN COMPARISON:  02/22/2006 FINDINGS: Lower chest: Visualized lower lung fields are clear. Hepatobiliary: Mild lobulations are seen in the margin of the liver. No focal abnormalities are seen. There is no dilation of bile ducts. Surgical clips are seen in gallbladder fossa. Pancreas: No focal abnormalities are seen. Spleen: Spleen measures 13.6 cm in maximum diameter. Adrenals/Urinary Tract: Adrenals are unremarkable. There is no hydronephrosis. There are no renal or ureteral stones. There are few smooth marginated low-density lesions in the left kidney suggesting possible cysts. Urinary bladder is not distended. Stomach/Bowel: Small hiatal hernia is seen. Small bowel loops are not dilated. Appendix is not seen. There is no significant wall thickening in colon. Vascular/Lymphatic: Scattered arterial calcifications are seen. Reproductive: Prostate appears smaller than usual in size. Other: There is large ascites. There  is no pneumoperitoneum. Small umbilical hernia containing fat is seen. There is subcutaneous edema, possibly anasarca. Musculoskeletal: No acute findings are seen. IMPRESSION: There is no evidence of intestinal obstruction or pneumoperitoneum. There is no hydronephrosis. Lobulations in the margin of liver suggests possible cirrhosis. Spleen is enlarged in size. There is large ascites. Small hiatal hernia. Other findings as described in the body of the report. Electronically Signed   By:  Elmer Picker M.D.   On: 03/10/2022 20:55   US Abdomen Complete  Result Date: 03/10/2022 CLINICAL DATA:  General abdominal pain EXAM: ABDOMEN ULTRASOUND COMPLETE COMPARISON:  03/07/2006 FINDINGS: Gallbladder: Surgically absent Common bile duct: Diameter: Not visualized Liver: Coarse nodular appearance favoring cirrhosis. Poor sonic penetration. Portal vein is patent on color Doppler imaging with normal direction of blood flow towards the liver. IVC: Not visualized Pancreas: Not visualized Spleen: Size and appearance within normal limits. Splenic volume 203 cc (within normal limits). Right Kidney: Length: 14.3. Echogenicity within normal limits. No mass or hydronephrosis visualized. Left Kidney: Length: 13.2. Echogenicity within normal limits. No mass or hydronephrosis visualized. Abdominal aorta: Not visualized Other findings: At least moderate ascites. Body habitus limits visualization of various structures. IMPRESSION: 1. Coarse nodular liver favoring cirrhosis. Poor sonic penetration of the liver. 2. Nonvisualization of various structures including the aorta, IVC, pancreas, common bile duct primarily attributable to body habitus. 3. Moderate ascites. Electronically Signed   By: Van Clines M.D.   On: 03/10/2022 16:08      Final Assessment and Plan:   Patient's HPI and PE findings are more consistent with CLD rather than ALF. Pending Para results at time of handoff. Follow up results per oncoming provider. Likely stable for DC with follow up with PCP  Clinical Impression:  1. Chronic liver disease      Data Unavailable   Final Clinical Impression(s) / ED Diagnoses Final diagnoses:  Chronic liver disease    Rx / DC Orders ED Discharge Orders     None         Tretha Sciara, MD 03/11/22 [phone]

## 2022-03-11 LAB — GLUCOSE, PLEURAL OR PERITONEAL FLUID: Glucose, Fluid: 102 mg/dL

## 2022-03-11 LAB — PROTIME-INR
INR: 1.6 — ABNORMAL HIGH (ref 0.8–1.2)
Prothrombin Time: 18.9 seconds — ABNORMAL HIGH (ref 11.4–15.2)

## 2022-03-11 LAB — GRAM STAIN: Gram Stain: NONE SEEN

## 2022-03-11 LAB — ALBUMIN, PLEURAL OR PERITONEAL FLUID: Albumin, Fluid: <1.5 g/dL

## 2022-03-11 LAB — PROTEIN, PLEURAL OR PERITONEAL FLUID: Total protein, fluid: <3 g/dL

## 2022-03-11 LAB — LACTATE DEHYDROGENASE, PLEURAL OR PERITONEAL FLUID: LD, Fluid: 45 U/L — ABNORMAL HIGH (ref 3–23)

## 2022-03-11 NOTE — ED Provider Notes (Addendum)
Care assumed from Dr. Oswald Hillock, patient with ascites and newly diagnosed cirrhosis, pending results of paracentesis.  Peritoneal fluid is significant only for elevated LDH which is nonspecific.  No evidence of infection.  He is felt to be safe for discharge with follow-up with PCP.  Results for orders placed or performed during the hospital encounter of 03/10/22  Culture, body fluid w Gram Stain-bottle   Specimen: Fluid  Result Value Ref Range   Specimen Description FLUID    Special Requests       PERI Performed at [facility], [address] [place], [place] [address]    Culture PENDING    Report Status PENDING   Gram stain   Specimen: Fluid  Result Value Ref Range   Specimen Description FLUID    Special Requests  PERI    Gram Stain      NO ORGANISMS SEEN NO WBC SEEN Performed at [facility], [place] [address], [place], [place] [address]    Report Status 03/11/2022 FINAL   CBC with Differential  Result Value Ref Range   WBC 9.2 4.0 - 10.5 K/uL   RBC 3.96 (L) 4.22 - 5.81 MIL/uL   Hemoglobin 13.9 13.0 - 17.0 g/dL   HCT 40.6 39.0 - 52.0 %   MCV 102.5 (H) 80.0 - 100.0 fL   MCH 35.1 (H) 26.0 - 34.0 pg   MCHC 34.2 30.0 - 36.0 g/dL   RDW 14.2 11.5 - 15.5 %   Platelets 206 150 - 400 K/uL   nRBC 0.0 0.0 - 0.2 %   Neutrophils Relative % 60 %   Neutro Abs 5.6 1.7 - 7.7 K/uL   Lymphocytes Relative 28 %   Lymphs Abs 2.5 0.7 - 4.0 K/uL   Monocytes Relative 10 %   Monocytes Absolute 0.9 0.1 - 1.0 K/uL   Eosinophils Relative 1 %   Eosinophils Absolute 0.1 0.0 - 0.5 K/uL   Basophils Relative 1 %   Basophils Absolute 0.1 0.0 - 0.1 K/uL   Immature Granulocytes 0 %   Abs Immature Granulocytes 0.02 0.00 - 0.07 K/uL  Comprehensive metabolic panel  Result Value Ref Range   Sodium 134 (L) 135 - 145 mmol/L   Potassium 3.5 3.5 - 5.1 mmol/L   Chloride 104 98 - 111 mmol/L   CO2 21 (L) 22 - 32 mmol/L   Glucose, Bld 97 70 - 99 mg/dL   BUN 6 6 - 20 mg/dL   Creatinine, Ser  0.94 0.61 - 1.24 mg/dL   Calcium 8.0 (L) 8.9 - 10.3 mg/dL   Total Protein 6.3 (L) 6.5 - 8.1 g/dL   Albumin 2.7 (L) 3.5 - 5.0 g/dL   AST 69 (H) 15 - 41 U/L   ALT 34 0 - 44 U/L   Alkaline Phosphatase 205 (H) 38 - 126 U/L   Total Bilirubin 2.5 (H) 0.3 - 1.2 mg/dL   GFR, Estimated >60 >60 mL/min   Anion gap 9 5 - 15  Lipase, blood  Result Value Ref Range   Lipase 41 11 - 51 U/L  Urinalysis, Routine w reflex microscopic  Result Value Ref Range   Color, Urine AMBER (A) YELLOW   APPearance HAZY (A) CLEAR   Specific Gravity, Urine 1.017 1.005 - 1.030   pH 6.0 5.0 - 8.0   Glucose, UA NEGATIVE NEGATIVE mg/dL   Hgb urine dipstick NEGATIVE NEGATIVE   Bilirubin Urine NEGATIVE NEGATIVE   Ketones, ur NEGATIVE NEGATIVE mg/dL   Protein, ur 30 (  A) NEGATIVE mg/dL   Nitrite NEGATIVE NEGATIVE   Leukocytes,Ua NEGATIVE NEGATIVE   RBC / HPF 0-5 0 - 5 RBC/hpf   WBC, UA 6-10 0 - 5 WBC/hpf   Bacteria, UA RARE (A) NONE SEEN   Squamous Epithelial / LPF 0-5 0 - 5   Mucus PRESENT    Hyaline Casts, UA PRESENT   Ammonia  Result Value Ref Range   Ammonia 27 9 - 35 umol/L  Lactate dehydrogenase (pleural or peritoneal fluid)  Result Value Ref Range   LD, Fluid 45 (H) 3 - 23 U/L   Fluid Type-FLDH PERITONEAL CAVITY   Glucose, pleural or peritoneal fluid  Result Value Ref Range   Glucose, Fluid 102 mg/dL   Fluid Type-FGLU PERITONEAL CAVITY   Protein, pleural or peritoneal fluid  Result Value Ref Range   Total protein, fluid <3.0 g/dL   Fluid Type-FTP PERITONEAL CAVITY   Albumin, pleural or peritoneal fluid   Result Value Ref Range   Albumin, Fluid <1.5 g/dL   Fluid Type-FALB PERITONEAL CAVITY   Protime-INR  Result Value Ref Range   Prothrombin Time 18.9 (H) 11.4 - 15.2 seconds   INR 1.6 (H) 0.8 - 1.2      Delora Fuel, MD 07/68/08 8110    Delora Fuel, MD 31/59/45 [phone]

## 2022-03-16 LAB — CULTURE, BODY FLUID W GRAM STAIN -BOTTLE: Culture: NO GROWTH

## 2022-03-21 ENCOUNTER — Encounter: Payer: Self-pay | Admitting: Gastroenterology

## 2022-03-30 ENCOUNTER — Other Ambulatory Visit: Payer: Self-pay

## 2022-03-30 ENCOUNTER — Emergency Department (HOSPITAL_COMMUNITY): Payer: Self-pay

## 2022-03-30 ENCOUNTER — Encounter (HOSPITAL_COMMUNITY): Payer: Self-pay

## 2022-03-30 ENCOUNTER — Emergency Department (HOSPITAL_COMMUNITY)
Admission: EM | Admit: 2022-03-30 | Discharge: 2022-03-30 | Disposition: A | Discharge status: Home / Self Care | Payer: Self-pay | Attending: Emergency Medicine | Admitting: Emergency Medicine

## 2022-03-30 DIAGNOSIS — Z7982 Long term (current) use of aspirin: Secondary | ICD-10-CM | POA: Insufficient documentation

## 2022-03-30 DIAGNOSIS — R188 Other ascites: Secondary | ICD-10-CM | POA: Insufficient documentation

## 2022-03-30 DIAGNOSIS — R1084 Generalized abdominal pain: Secondary | ICD-10-CM | POA: Insufficient documentation

## 2022-03-30 HISTORY — DX: Unspecified cirrhosis of liver: K74.60

## 2022-03-30 LAB — LACTATE DEHYDROGENASE, PLEURAL OR PERITONEAL FLUID: LD, Fluid: 38 U/L — ABNORMAL HIGH (ref 3–23)

## 2022-03-30 LAB — PROTEIN, PLEURAL OR PERITONEAL FLUID: Total protein, fluid: <3 g/dL

## 2022-03-30 LAB — GLUCOSE, PLEURAL OR PERITONEAL FLUID: Glucose, Fluid: 106 mg/dL

## 2022-03-30 LAB — BODY FLUID CELL COUNT WITH DIFFERENTIAL
Lymphs, Fluid: 62 %
Monocyte-Macrophage-Serous Fluid: 26 % — ABNORMAL LOW (ref 50–90)
Neutrophil Count, Fluid: 12 % (ref 0–25)
Total Nucleated Cell Count, Fluid: 402 cu mm (ref 0–1000)

## 2022-03-30 LAB — COMPREHENSIVE METABOLIC PANEL
ALT: 32 U/L (ref 0–44)
AST: 61 U/L — ABNORMAL HIGH (ref 15–41)
Albumin: 2.6 g/dL — ABNORMAL LOW (ref 3.5–5.0)
Alkaline Phosphatase: 217 U/L — ABNORMAL HIGH (ref 38–126)
Anion gap: 4 — ABNORMAL LOW (ref 5–15)
BUN: 8 mg/dL (ref 6–20)
CO2: 26 mmol/L (ref 22–32)
Calcium: 8.2 mg/dL — ABNORMAL LOW (ref 8.9–10.3)
Chloride: 105 mmol/L (ref 98–111)
Creatinine, Ser: 0.78 mg/dL (ref 0.61–1.24)
GFR, Estimated: >60 mL/min (ref >60)
Glucose, Bld: 93 mg/dL (ref 70–99)
Potassium: 3.4 mmol/L — ABNORMAL LOW (ref 3.5–5.1)
Sodium: 135 mmol/L (ref 135–145)
Total Bilirubin: 2.1 mg/dL — ABNORMAL HIGH (ref 0.3–1.2)
Total Protein: 6.6 g/dL (ref 6.5–8.1)

## 2022-03-30 LAB — CBC
HCT: 40.8 % (ref 39.0–52.0)
Hemoglobin: 13.6 g/dL (ref 13.0–17.0)
MCH: 35.3 pg — ABNORMAL HIGH (ref 26.0–34.0)
MCHC: 33.3 g/dL (ref 30.0–36.0)
MCV: 106 fL — ABNORMAL HIGH (ref 80.0–100.0)
Platelets: 144 10*3/uL — ABNORMAL LOW (ref 150–400)
RBC: 3.85 MIL/uL — ABNORMAL LOW (ref 4.22–5.81)
RDW: 14 % (ref 11.5–15.5)
WBC: 7.2 10*3/uL (ref 4.0–10.5)
nRBC: 0 % (ref 0.0–0.2)

## 2022-03-30 LAB — LIPASE, BLOOD: Lipase: 40 U/L (ref 11–51)

## 2022-03-30 MED ORDER — LIDOCAINE HCL 1 % IJ SOLN
INTRAMUSCULAR | Status: AC
  Administered 2022-03-30: 15 mL
  Filled 2022-03-30: qty 20

## 2022-03-30 NOTE — ED Provider Notes (Signed)
[facility] DEPT Provider Note   CSN: [id number] Arrival date & time: 03/30/22  0747     History  Chief Complaint  Patient presents with   Abdominal Pain    Erik Howe is a 51 y.o. male.  HPI Patient presents with abdominal pain.  He notes that he is recently diagnosed with cirrhosis, requiring paracentesis a month ago.  He is scheduled to follow-up with GI in another month, for further evaluation of his hepatic dysfunction, but he has no known etiology for his cirrhosis.  He denies substantial alcohol use, acknowledges long-term use of SSRI.    Home Medications Prior to Admission medications   Medication Sig Start Date End Date Taking? Authorizing Provider  aspirin EC 81 MG tablet Take 81 mg by mouth daily. Swallow whole.   Yes [provider]  cyclobenzaprine (FLEXERIL) 10 MG tablet Take 1 Tablet by mouth nightly at bedtime as needed for muscle spasms 12/15/21  Yes   EPINEPHrine (PRIMATENE MIST) 0.125 MG/ACT AERO Inhale 1 puff into the lungs daily as needed (asthma symptoms).   Yes [provider]  hydrOXYzine (ATARAX) 25 MG tablet Take 1 tablet by mouth 3 times per day as needed Patient taking differently: Take 25 mg by mouth 3 (three) times daily as needed for itching. 10/13/21  Yes   ibuprofen (ADVIL,MOTRIN) 200 MG tablet Take 400 mg by mouth daily as needed for moderate pain or headache.   Yes [provider]  triamcinolone ointment (KENALOG) 0.1 % apply a thin layer to the affected area(s) by topical route 2 times per day Patient taking differently: Apply 1 Application topically daily as needed (eczema). 09/17/21  Yes   albuterol (PROVENTIL HFA;VENTOLIN HFA) 108 (90 BASE) MCG/ACT inhaler Inhale 2 puffs into the lungs every 6 (six) hours as needed for wheezing. 01/12/11 09/05/11  Cletus Gash, MD      Allergies    Paxil [paroxetine], Tylenol [acetaminophen], and Ultram [tramadol]    Review of Systems   Review  of Systems  All other systems reviewed and are negative.   Physical Exam Updated Vital Signs BP 126/80   Pulse 89   Temp 98.3 F (36.8 C) (Oral)   Resp 18   Ht '6\' 2"'$  (1.88 m)   Wt (!) 140.6 kg   SpO2 100%   BMI 39.80 kg/m  Physical Exam Vitals and nursing note reviewed.  Constitutional:      General: He is not in acute distress.    Appearance: He is well-developed.  HENT:     Head: Normocephalic and atraumatic.  Eyes:     Conjunctiva/sclera: Conjunctivae normal.  Cardiovascular:     Rate and Rhythm: Regular rhythm. Tachycardia present.  Pulmonary:     Effort: Pulmonary effort is normal. No respiratory distress.     Breath sounds: No stridor.  Abdominal:     General: There is distension.     Tenderness: There is abdominal tenderness. There is guarding.  Skin:    General: Skin is warm and dry.     Coloration: Skin is pale.  Neurological:     Mental Status: He is alert and oriented to person, place, and time.     ED Results / Procedures / Treatments   Labs (all labs ordered are listed, but only abnormal results are displayed) Labs Reviewed  COMPREHENSIVE METABOLIC PANEL - Abnormal; Notable for the following components:      Result Value   Potassium 3.4 (*)    Calcium 8.2 (*)  Albumin 2.6 (*)    AST 61 (*)    Alkaline Phosphatase 217 (*)    Total Bilirubin 2.1 (*)    Anion gap 4 (*)    All other components within normal limits  CBC - Abnormal; Notable for the following components:   RBC 3.85 (*)    MCV 106.0 (*)    MCH 35.3 (*)    Platelets 144 (*)    All other components within normal limits  LACTATE DEHYDROGENASE, PLEURAL OR PERITONEAL FLUID - Abnormal; Notable for the following components:   LD, Fluid 38 (*)    All other components within normal limits  BODY FLUID CELL COUNT WITH DIFFERENTIAL - Abnormal; Notable for the following components:   Color, Fluid YELLOW (*)    Monocyte-Macrophage-Serous Fluid 26 (*)    All other components within normal  limits  BODY FLUID CULTURE W GRAM STAIN  LIPASE, BLOOD  PROTEIN, PLEURAL OR PERITONEAL FLUID  GLUCOSE, PLEURAL OR PERITONEAL FLUID  URINALYSIS, ROUTINE W REFLEX MICROSCOPIC  TRIGLYCERIDES, BODY FLUIDS  LIPASE, FLUID  CYTOLOGY - NON PAP    EKG None  Radiology US Paracentesis  Result Date: 03/30/2022 INDICATION: Patient with recent diagnosis of cirrhosis. Recurrent abdominal distention, ascites. Request made for diagnostic and therapeutic paracentesis. EXAM: ULTRASOUND GUIDED DIAGNOSTIC AND THERAPEUTIC PARACENTESIS MEDICATIONS: 10 mL 1% lidocaine COMPLICATIONS: None immediate. PROCEDURE: Informed written consent was obtained from the patient after a discussion of the risks, benefits and alternatives to treatment. A timeout was performed prior to the initiation of the procedure. Initial ultrasound scanning demonstrates a large amount of ascites within the right lower abdominal quadrant. The right lower abdomen was prepped and draped in the usual sterile fashion. 1% lidocaine was used for local anesthesia. Following this, a 19 gauge, 7-cm, Yueh catheter was introduced. An ultrasound image was saved for documentation purposes. The paracentesis was performed. The catheter was removed and a dressing was applied. The patient tolerated the procedure well without immediate post procedural complication. FINDINGS: A total of approximately 5.0 liters of yellow fluid was removed. Samples were sent to the laboratory as requested by the clinical team. IMPRESSION: Successful ultrasound-guided paracentesis yielding 5.0 liters of peritoneal fluid. Read by: Brynda Greathouse PA-C PLAN: If the patient eventually requires >/=2 paracenteses in a 30 day period, candidacy for formal evaluation by the [facility] will be assessed. Electronically Signed   By: Corrie Mckusick D.O.   On: 03/30/2022 11:36    Procedures Procedures    Medications Ordered in ED Medications   lidocaine (XYLOCAINE) 1 % (with pres) injection (15 mLs  Given 03/30/22 0959)    ED Course/ Medical Decision Making/ A&P This patient with a Hx of depression, obesity, newly diagnosed cirrhosis presents to the ED for concern of abdominal distention, discomfort, anorexia, this involves an extensive number of treatment options, and is a complaint that carries with it a high risk of complications and morbidity.    The differential diagnosis includes SBP, ascites, worsening hepatobiliary dysfunction   Social Determinants of Health:  Obesity, depression  Additional history obtained:  Additional history and/or information obtained from chart review, notable for prior notation for paracentesis performed without complication 1 month ago   After the initial evaluation, orders, including: Labs paracentesis were initiated.   Patient placed on Cardiac and Pulse-Oximetry Monitors. The patient was maintained on a cardiac monitor.  The cardiac monitored showed an rhythm of 105 sinus tach abnormal The patient was also maintained on pulse oximetry. The readings  were typically 100% room air normal   On repeat evaluation of the patient improved  Lab Tests:  I personally interpreted labs.  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  No evidence for bacteremia, sepsis.  Hospitalization a consideration given the patient's abdominal pain, hepatic dysfunction.  However, patient had successful drainage of his ascites, was discharged to follow-up with  gastroenterology.  Final Clinical Impression(s) / ED Diagnoses Final diagnoses:  Generalized abdominal pain  Other ascites     Carmin Muskrat, MD 03/30/22 1254

## 2022-03-30 NOTE — Procedures (Signed)
PROCEDURE SUMMARY:  Successful US guided paracentesis from right lateral abdomen.  Yielded 5.0 liters of yellow fluid.  No immediate complications.  Pt tolerated well.   Specimen was sent for labs.  EBL < 23m  KDocia BarrierPA-C 03/30/2022 10:00 AM

## 2022-03-30 NOTE — Discharge Instructions (Signed)
Please be sure to follow-up with your gastroenterology team.  Call today and let them know that you are seen here, if they do not contact you for more expeditious follow-up.  Return here for concerning changes in your condition.
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
## 2022-03-31 LAB — TRIGLYCERIDES, BODY FLUIDS: Triglycerides, Fluid: 27 mg/dL

## 2022-03-31 LAB — CYTOLOGY - NON PAP

## 2022-04-02 LAB — BODY FLUID CULTURE W GRAM STAIN: Culture: NO GROWTH

## 2022-04-02 LAB — LIPASE, FLUID: Lipase-Fluid: 8 U/L

## 2022-04-11 ENCOUNTER — Other Ambulatory Visit: Payer: Self-pay

## 2022-04-11 MED ORDER — SPIRONOLACTONE 100 MG PO TABS
100.0000 mg | ORAL_TABLET | Freq: Every day | ORAL | 2 refills | Status: DC
  Filled 2022-04-11: qty 30, 30d supply, fill #0

## 2022-04-11 MED ORDER — FUROSEMIDE 40 MG PO TABS
40.0000 mg | ORAL_TABLET | Freq: Every day | ORAL | 2 refills | Status: DC
  Filled 2022-04-11: qty 30, 30d supply, fill #0

## 2022-04-11 MED ORDER — ALBUTEROL SULFATE HFA 108 (90 BASE) MCG/ACT IN AERS
2.0000 | INHALATION_SPRAY | Freq: Four times a day (QID) | RESPIRATORY_TRACT | 2 refills | Status: DC | PRN
  Filled 2022-04-11: qty 6.7, 25d supply, fill #0
  Filled 2022-06-23 – 2022-06-24 (×2): qty 6.7, 25d supply, fill #1

## 2022-04-12 ENCOUNTER — Encounter (HOSPITAL_COMMUNITY): Payer: Self-pay

## 2022-04-12 ENCOUNTER — Emergency Department (HOSPITAL_COMMUNITY)
Admission: EM | Admit: 2022-04-12 | Discharge: 2022-04-12 | Disposition: A | Discharge status: Home / Self Care | Payer: No Typology Code available for payment source | Attending: Emergency Medicine | Admitting: Emergency Medicine

## 2022-04-12 ENCOUNTER — Other Ambulatory Visit: Payer: Self-pay

## 2022-04-12 DIAGNOSIS — R188 Other ascites: Secondary | ICD-10-CM | POA: Diagnosis not present

## 2022-04-12 DIAGNOSIS — K7460 Unspecified cirrhosis of liver: Secondary | ICD-10-CM | POA: Insufficient documentation

## 2022-04-12 DIAGNOSIS — Z7982 Long term (current) use of aspirin: Secondary | ICD-10-CM | POA: Diagnosis not present

## 2022-04-12 DIAGNOSIS — R109 Unspecified abdominal pain: Secondary | ICD-10-CM | POA: Diagnosis present

## 2022-04-12 DIAGNOSIS — E871 Hypo-osmolality and hyponatremia: Secondary | ICD-10-CM | POA: Diagnosis not present

## 2022-04-12 LAB — CBC
HCT: 37.7 % — ABNORMAL LOW (ref 39.0–52.0)
Hemoglobin: 12.7 g/dL — ABNORMAL LOW (ref 13.0–17.0)
MCH: 35.6 pg — ABNORMAL HIGH (ref 26.0–34.0)
MCHC: 33.7 g/dL (ref 30.0–36.0)
MCV: 105.6 fL — ABNORMAL HIGH (ref 80.0–100.0)
Platelets: 199 10*3/uL (ref 150–400)
RBC: 3.57 MIL/uL — ABNORMAL LOW (ref 4.22–5.81)
RDW: 13.4 % (ref 11.5–15.5)
WBC: 7.4 10*3/uL (ref 4.0–10.5)
nRBC: 0 % (ref 0.0–0.2)

## 2022-04-12 LAB — COMPREHENSIVE METABOLIC PANEL
ALT: 32 U/L (ref 0–44)
AST: 61 U/L — ABNORMAL HIGH (ref 15–41)
Albumin: 2.5 g/dL — ABNORMAL LOW (ref 3.5–5.0)
Alkaline Phosphatase: 190 U/L — ABNORMAL HIGH (ref 38–126)
Anion gap: 5 (ref 5–15)
BUN: 11 mg/dL (ref 6–20)
CO2: 25 mmol/L (ref 22–32)
Calcium: 7.9 mg/dL — ABNORMAL LOW (ref 8.9–10.3)
Chloride: 102 mmol/L (ref 98–111)
Creatinine, Ser: 0.87 mg/dL (ref 0.61–1.24)
GFR, Estimated: >60 mL/min (ref >60)
Glucose, Bld: 102 mg/dL — ABNORMAL HIGH (ref 70–99)
Potassium: 4.2 mmol/L (ref 3.5–5.1)
Sodium: 132 mmol/L — ABNORMAL LOW (ref 135–145)
Total Bilirubin: 2 mg/dL — ABNORMAL HIGH (ref 0.3–1.2)
Total Protein: 6 g/dL — ABNORMAL LOW (ref 6.5–8.1)

## 2022-04-12 LAB — URINALYSIS, ROUTINE W REFLEX MICROSCOPIC
Bacteria, UA: NONE SEEN
Bilirubin Urine: NEGATIVE
Glucose, UA: NEGATIVE mg/dL
Hgb urine dipstick: NEGATIVE
Ketones, ur: NEGATIVE mg/dL
Leukocytes,Ua: NEGATIVE
Nitrite: NEGATIVE
Protein, ur: 100 mg/dL — AB
Specific Gravity, Urine: 1.021 (ref 1.005–1.030)
pH: 5 (ref 5.0–8.0)

## 2022-04-12 LAB — APTT: aPTT: 37 seconds — ABNORMAL HIGH (ref 24–36)

## 2022-04-12 LAB — LIPASE, BLOOD: Lipase: 41 U/L (ref 11–51)

## 2022-04-12 LAB — PROTIME-INR
INR: 1.5 — ABNORMAL HIGH (ref 0.8–1.2)
Prothrombin Time: 17.8 seconds — ABNORMAL HIGH (ref 11.4–15.2)

## 2022-04-12 MED ORDER — HYDROMORPHONE HCL 1 MG/ML IJ SOLN
1.0000 mg | Freq: Once | INTRAMUSCULAR | Status: AC
  Administered 2022-04-12: 1 mg via INTRAVENOUS
  Filled 2022-04-12: qty 1

## 2022-04-12 NOTE — ED Provider Notes (Signed)
[place] DEPT Provider Note   CSN: [id number] Arrival date & time: 04/12/22  0740     History  Chief Complaint  Patient presents with   Abdominal Pain   Nausea   Back Pain    Erik Howe is a 51 y.o. male with medical history of cirrhosis of the liver.  Patient presents to the ED for evaluation of abdominal bloating, distention and pain.  The patient reports that he had a paracentesis done on 10/11 here in the ED.  The patient reports that about 5 days after his paracentesis, he developed progressive bloating and distention of his abdomen.  The patient states that he does have a diagnosis of cirrhosis, he is unsure how he has developed cirrhosis.  The patient states he has not been seen by gastroenterology yet as he has not been able to get into see a gastroenterologist.  The patient reports that he has been dealing with his cirrhosis since Labor Day weekend.  The patient denies any excessive drinking, history of hepatitis.  Patient denies any recent fevers, nausea, vomiting, diarrhea.  Patient is scheduled to follow-up with GI for initial patient visit on November 13.   Abdominal Pain Associated symptoms: no diarrhea, no fever, no nausea and no vomiting   Back Pain Associated symptoms: abdominal pain   Associated symptoms: no fever        Home Medications Prior to Admission medications   Medication Sig Start Date End Date Taking? Authorizing Provider  albuterol (VENTOLIN HFA) 108 (90 Base) MCG/ACT inhaler Inhale 2 puffs into the lungs every 6 (six) hours as needed for shortness of breath 04/11/22     aspirin EC 81 MG tablet Take 81 mg by mouth daily. Swallow whole.    [provider]  cyclobenzaprine (FLEXERIL) 10 MG tablet Take 1 Tablet by mouth nightly at bedtime as needed for muscle spasms 12/15/21     EPINEPHrine (PRIMATENE MIST) 0.125 MG/ACT AERO Inhale 1 puff into the lungs daily as needed (asthma symptoms).    [provider]  furosemide (LASIX) 40 MG tablet Take 1 Tablet by mouth once daily 04/11/22     hydrOXYzine (ATARAX) 25 MG tablet Take 1 tablet by mouth 3 times per day as needed Patient taking differently: Take 25 mg by mouth 3 (three) times daily as needed for itching. 10/13/21     ibuprofen (ADVIL,MOTRIN) 200 MG tablet Take 400 mg by mouth daily as needed for moderate pain or headache.    [provider]  spironolactone (ALDACTONE) 100 MG tablet Take 1 Tablet by mouth once daily 04/11/22     triamcinolone ointment (KENALOG) 0.1 % apply a thin layer to the affected area(s) by topical route 2 times per day Patient taking differently: Apply 1 Application topically daily as needed (eczema). 09/17/21         Allergies    Paxil [paroxetine], Tylenol [acetaminophen], and Ultram [tramadol]    Review of Systems   Review of Systems  Constitutional:  Negative for fever.  Gastrointestinal:  Positive for abdominal pain. Negative for diarrhea, nausea and vomiting.  All other systems reviewed and are negative.   Physical Exam Updated Vital Signs BP (!) 134/102   Pulse 89   Temp 97.8 F (36.6 C) (Oral)   Resp 20   Ht 6' 2"  (1.88 m)   Wt (!) 154.2 kg   SpO2 99%   BMI 43.65 kg/m  Physical Exam Vitals and nursing note reviewed.  Constitutional:  General: He is not in acute distress.    Appearance: Normal appearance. He is not ill-appearing, toxic-appearing or diaphoretic.  HENT:     Head: Normocephalic and atraumatic.     Nose: Nose normal. No congestion.     Mouth/Throat:     Mouth: Mucous membranes are moist.     Pharynx: Oropharynx is clear.  Eyes:     Extraocular Movements: Extraocular movements intact.     Conjunctiva/sclera: Conjunctivae normal.     Pupils: Pupils are equal, round, and reactive to light.  Cardiovascular:     Rate and Rhythm: Normal rate and regular rhythm.  Pulmonary:     Effort: Pulmonary effort is normal.     Breath sounds: Normal breath sounds. No wheezing.   Abdominal:     General: Abdomen is flat. Bowel sounds are normal. There is distension.     Palpations: Abdomen is soft.     Tenderness: There is abdominal tenderness.     Comments: Appreciable fluid wave noted  Musculoskeletal:     Cervical back: Normal range of motion.  Skin:    General: Skin is warm and dry.     Capillary Refill: Capillary refill takes less than 2 seconds.  Neurological:     Mental Status: He is alert and oriented to person, place, and time.     ED Results / Procedures / Treatments   Labs (all labs ordered are listed, but only abnormal results are displayed) Labs Reviewed  COMPREHENSIVE METABOLIC PANEL - Abnormal; Notable for the following components:      Result Value   Sodium 132 (*)    Glucose, Bld 102 (*)    Calcium 7.9 (*)    Total Protein 6.0 (*)    Albumin 2.5 (*)    AST 61 (*)    Alkaline Phosphatase 190 (*)    Total Bilirubin 2.0 (*)    All other components within normal limits  CBC - Abnormal; Notable for the following components:   RBC 3.57 (*)    Hemoglobin 12.7 (*)    HCT 37.7 (*)    MCV 105.6 (*)    MCH 35.6 (*)    All other components within normal limits  URINALYSIS, ROUTINE W REFLEX MICROSCOPIC - Abnormal; Notable for the following components:   Color, Urine AMBER (*)    APPearance HAZY (*)    Protein, ur 100 (*)    All other components within normal limits  PROTIME-INR - Abnormal; Notable for the following components:   Prothrombin Time 17.8 (*)    INR 1.5 (*)    All other components within normal limits  APTT - Abnormal; Notable for the following components:   aPTT 37 (*)    All other components within normal limits  BODY FLUID CULTURE W GRAM STAIN  LIPASE, BLOOD  LACTATE DEHYDROGENASE, PLEURAL OR PERITONEAL FLUID  GLUCOSE, PLEURAL OR PERITONEAL FLUID  PROTEIN, PLEURAL OR PERITONEAL FLUID  ALBUMIN, PLEURAL OR PERITONEAL FLUID     EKG None  Radiology No results found.  Procedures .Paracentesis  Date/Time:  04/12/2022 10:12 PM  Performed by: Azucena Cecil, PA-C Authorized by: Azucena Cecil, PA-C   Consent:    Consent obtained:  Verbal and written   Consent given by:  Patient   Risks, benefits, and alternatives were discussed: yes     Risks discussed:  Bleeding, bowel perforation, infection and pain   Alternatives discussed:  No treatment Universal protocol:    Patient identity confirmed:  Verbally with patient and  arm band Pre-procedure details:    Procedure purpose:  Therapeutic   Preparation: Patient was prepped and draped in usual sterile fashion   Anesthesia:    Anesthesia method:  Local infiltration   Local anesthetic:  Lidocaine 2% WITH epi Procedure details:    Needle gauge:  18   Ultrasound guidance: yes     Puncture site:  R lower quadrant   Fluid removed amount:  4L   Fluid appearance:  Cloudy and yellow   Dressing:  4x4 sterile gauze Post-procedure details:    Procedure completion:  Tolerated well, no immediate complications    Medications Ordered in ED Medications  HYDROmorphone (DILAUDID) injection 1 mg (1 mg Intravenous Given 04/12/22 1822)    ED Course/ Medical Decision Making/ A&P Clinical Course as of 04/12/22 2218  Tue Oct 24, 450  [id number] 51 year old male with recent diagnosis of cirrhosis status post paracentesis here now with increased abdominal pain and distention.  Otherwise nontoxic-appearing.  By ultrasound he has a fairly good sized pocket of fluid.  We proceeded with paracentesis after informed consent.  Patient tolerated procedure well.  4 L cloudy yellow fluid removed. [MB]    Clinical Course User Index [MB] Hayden Rasmussen, MD                           Medical Decision Making Amount and/or Complexity of Data Reviewed Labs: ordered.  Risk Prescription drug management.   51 year old male presents to the ED for evaluation.  Please see HPI for further details.  On examination the patient is afebrile, nontachycardic.  Patient  sounds are clear bilaterally, he is not hypoxic.  Patient abdomen is distended without appreciable fluid wave.  The patient neurological examination shows no focal neurodeficits.  Patient PT/INR elevated at 17.8, 1.5 respectively.  Patient lipase unremarkable.  Patient CBC unremarkable, no leukocytosis.  The patient CMP shows decreased sodium to 132, decreased albumin to 2.5, elevated AST to 61, bilirubin of 2, alk phos of 190.  Patient APTT 37.  Patient urinalysis unremarkable.  Bedside paracentesis was conducted by myself and my attending Dr. Melina Copa.  4 L of cloudy yellow fluid removed and sent off for analysis.  The patient has a follow-up with gastroenterology on November 13.  The patient will be discharged home and advised to follow-up with GI in November 13.  The patient was given return precautions and he voiced understanding.  The patient had all of his questions answered to his satisfaction.  The patient stable at this time.  Final Clinical Impression(s) / ED Diagnoses Final diagnoses:  Other ascites  Cirrhosis of liver with ascites, unspecified hepatic cirrhosis type [facility])    Rx / DC Orders ED Discharge Orders     None         Lawana Chambers 04/12/22 2218    Hayden Rasmussen, MD 04/13/22 1018

## 2022-04-12 NOTE — ED Notes (Signed)
Unsuccessful attempt for labs in triage x2

## 2022-04-12 NOTE — ED Provider Triage Note (Signed)
Emergency Medicine Provider Triage Evaluation Note  Erik Howe , a 51 y.o. male  was evaluated in triage.  Pt complains of abdominal distention.  History of cirrhosis, unclear etiology.  Has needed multiple paracentesis previously.  He feels like his weight is up.  Recently had paracentesis 2 weeks ago, his symptoms improved however have recurred.  Does have some shortness of breath or he states this typically occurs when he needs a paracentesis.  Cough, chest pain.  States his abdomen feels tight.  No EtOH use.  Cannot be seen by GI for another 3 weeks.  Review of Systems  Positive: ascites Negative: fever  Physical Exam  BP (!) 135/113   Pulse (!) 109   Temp 98.3 F (36.8 C) (Oral)   Resp 20   Ht '6\' 2"'$  (1.88 m)   Wt (!) 154.2 kg   SpO2 99%   BMI 43.65 kg/m  Gen:   Awake, no distress   Resp:  Normal effort  MSK:   Moves extremities without difficulty  ABD:  Distended, fluid wave, non tender Other:    Medical Decision Making  Medically screening exam initiated at 8:09 AM.  Appropriate orders placed.  Erik Howe was informed that the remainder of the evaluation will be completed by another provider, this initial triage assessment does not replace that evaluation, and the importance of remaining in the ED until their evaluation is complete.  Ascites    Alaena Strader A, PA-C 04/12/22 2992

## 2022-04-12 NOTE — ED Triage Notes (Signed)
Patient reports that he has a history of cirrhosis of the liver.  Patient reports abdominal pain, abdominal  distention, and nausea x 1 month and getting progressively worse.

## 2022-04-12 NOTE — ED Notes (Signed)
Pt given coke to drink, okay with provider

## 2022-04-12 NOTE — ED Notes (Signed)
Patient refused lab draw.

## 2022-04-12 NOTE — ED Notes (Signed)
Pt resting and watching TV, NAD noted, observed even RR and unlabored, side rails up x2 for safety, plan of care ongoing, pt expresses no needs or concerns at this time, call light within reach, no further concerns as of present.  

## 2022-04-12 NOTE — ED Notes (Signed)
Providers at the bedside for paracentesis, NT Faith to assist

## 2022-04-12 NOTE — ED Notes (Signed)
NT Faith to hand deliver peritoneal fluid specimen to lab 4 liters pulled during paracentesis (4 bottles) yellow in color

## 2022-04-12 NOTE — ED Notes (Signed)
Pt was discharge by provider Astrid Drafts, PA-C

## 2022-04-12 NOTE — Discharge Instructions (Addendum)
Please return to the ED with any new or worsening signs or symptoms Please follow-up with gastroenterology on November 13 Please read attached guide concerning ascites as well as cirrhosis

## 2022-04-13 LAB — ALBUMIN, PLEURAL OR PERITONEAL FLUID: Albumin, Fluid: <1.5 g/dL

## 2022-04-13 LAB — PROTEIN, PLEURAL OR PERITONEAL FLUID: Total protein, fluid: <3 g/dL

## 2022-04-13 LAB — LACTATE DEHYDROGENASE, PLEURAL OR PERITONEAL FLUID: LD, Fluid: 40 U/L — ABNORMAL HIGH (ref 3–23)

## 2022-04-13 LAB — GLUCOSE, PLEURAL OR PERITONEAL FLUID: Glucose, Fluid: 105 mg/dL

## 2022-04-14 ENCOUNTER — Other Ambulatory Visit: Payer: Self-pay

## 2022-04-16 LAB — BODY FLUID CULTURE W GRAM STAIN: Culture: NO GROWTH

## 2022-05-02 ENCOUNTER — Other Ambulatory Visit (INDEPENDENT_AMBULATORY_CARE_PROVIDER_SITE_OTHER): Payer: No Typology Code available for payment source

## 2022-05-02 ENCOUNTER — Ambulatory Visit: Payer: No Typology Code available for payment source | Admitting: Gastroenterology

## 2022-05-02 ENCOUNTER — Encounter: Payer: Self-pay | Admitting: Gastroenterology

## 2022-05-02 VITALS — BP 110/80 | HR 112 | Ht 74.0 in | Wt 312.2 lb

## 2022-05-02 DIAGNOSIS — R188 Other ascites: Secondary | ICD-10-CM

## 2022-05-02 DIAGNOSIS — R195 Other fecal abnormalities: Secondary | ICD-10-CM

## 2022-05-02 DIAGNOSIS — K7469 Other cirrhosis of liver: Secondary | ICD-10-CM

## 2022-05-02 DIAGNOSIS — R197 Diarrhea, unspecified: Secondary | ICD-10-CM

## 2022-05-02 DIAGNOSIS — K7460 Unspecified cirrhosis of liver: Secondary | ICD-10-CM

## 2022-05-02 LAB — BASIC METABOLIC PANEL
BUN: 7 mg/dL (ref 6–23)
CO2: 28 mEq/L (ref 19–32)
Calcium: 8.5 mg/dL (ref 8.4–10.5)
Chloride: 101 mEq/L (ref 96–112)
Creatinine, Ser: 0.78 mg/dL (ref 0.40–1.50)
GFR: 103.54 mL/min (ref >60.00)
Glucose, Bld: 91 mg/dL (ref 70–99)
Potassium: 3.8 mEq/L (ref 3.5–5.1)
Sodium: 135 mEq/L (ref 135–145)

## 2022-05-02 LAB — IBC + FERRITIN
Ferritin: 841.2 ng/mL — ABNORMAL HIGH (ref 22.0–322.0)
Iron: 153 ug/dL (ref 42–165)
Saturation Ratios: 81 % — ABNORMAL HIGH (ref 20.0–50.0)
TIBC: 189 ug/dL — ABNORMAL LOW (ref 250.0–450.0)
Transferrin: 135 mg/dL — ABNORMAL LOW (ref 212.0–360.0)

## 2022-05-02 LAB — HEPATIC FUNCTION PANEL
ALT: 26 U/L (ref 0–53)
AST: 41 U/L — ABNORMAL HIGH (ref 0–37)
Albumin: 2.9 g/dL — ABNORMAL LOW (ref 3.5–5.2)
Alkaline Phosphatase: 169 U/L — ABNORMAL HIGH (ref 39–117)
Bilirubin, Direct: 0.6 mg/dL — ABNORMAL HIGH (ref 0.0–0.3)
Total Bilirubin: 1.5 mg/dL — ABNORMAL HIGH (ref 0.2–1.2)
Total Protein: 6.8 g/dL (ref 6.0–8.3)

## 2022-05-02 LAB — VITAMIN D 25 HYDROXY (VIT D DEFICIENCY, FRACTURES): VITD: 9.64 ng/mL — ABNORMAL LOW (ref 30.00–100.00)

## 2022-05-02 NOTE — Progress Notes (Unsigned)
HPI : Erik Howe is a pleasant 51 year old male who was referred to [place] by Dr. Vassie Moment for colonoscopy after a positive fit test.  Of note the patient was also recently diagnosed with decompensated cirrhosis.  He presented to the emergency department September 21 with worsening abdominal pain and distention.  CT scan showed a cirrhotic appearing liver with large amount of ascites.  He underwent a therapeutic paracentesis which was negative for SBP, and consistent with ascites secondary to portal hypertension. He presented to the emergency department again 3 weeks later with symptomatic ascites and again underwent a therapeutic paracentesis (5 L).  He presented a third time only 2 weeks later (October 24) and again had another therapeutic paracentesis, removing 4 L of fluid.  He was started on 40 mg of Lasix and 100 mg of Aldactone by his primary doctor on October 23.   Noted to have elevated liver enzymes in April.  Late August/early Sept, developed  Has had increasing abdominal swelling, feels ready to get fluid out again.  Never been a heavy drinker.  Drank beer when he was younger.  Last time he had alcohol was late May/early June No family history of liver disease No diabetes.  NO history of HLP or HTN. No hx of IVDU  Looked after his mother for 20 years.  She died last year.  He was her primary caretaker No history of blood tranfusion.  One tattoo from the 1990s.    Low appetite, nauseated; doesn't really improve when fluid is removed.  Diarrhea after meals for about a year.  4-5 bowel movements/day Rare solid stool. Occasional blood in stool.  No prior colonoscopy    An ultrasound in 2007 showed normal liver parenchyma.  Liver enzymes in 2019 were unremarkable.  Apr 12, 2022 MELD 14 MELDNa 18  Sept 21:  Presented to ED with ascites, underwent therapeutic paracentesis in ED Oct 11:  Therapeutic paracentesis in ED Oct 23:  Started lasix/aldactone Oct 24:  Therapeutic  paracentesis in ED  Past Medical History:  Diagnosis Date   Cellulitis 12/18/2012   left foot   Cirrhosis of liver (HCC)    Varicose veins      Past Surgical History:  Procedure Laterality Date   CHOLECYSTECTOMY     ENDOVENOUS ABLATION SAPHENOUS VEIN W/ LASER Left 09-30-2013   left greater saphenous vein   Family History  Problem Relation Age of Onset   Varicose Veins Mother    Heart disease Mother    Diabetes Mother    Arthritis Mother    Hypertension Mother    Stroke Father    Heart disease Father    Social History   Tobacco Use   Smoking status: Every Day    Packs/day: 0.15    Types: Cigarettes   Smokeless tobacco: Never  Vaping Use   Vaping Use: Never used  Substance Use Topics   Alcohol use: No   Drug use: No   Current Outpatient Medications  Medication Sig Dispense Refill   albuterol (VENTOLIN HFA) 108 (90 Base) MCG/ACT inhaler Inhale 2 puffs into the lungs every 6 (six) hours as needed for shortness of breath 6.7 g 2   aspirin EC 81 MG tablet Take 81 mg by mouth daily. Swallow whole.     cyclobenzaprine (FLEXERIL) 10 MG tablet Take 1 Tablet by mouth nightly at bedtime as needed for muscle spasms 30 tablet 5   EPINEPHrine (PRIMATENE MIST) 0.125 MG/ACT AERO Inhale 1 puff into the lungs daily as  needed (asthma symptoms).     furosemide (LASIX) 40 MG tablet Take 1 Tablet by mouth once daily 30 tablet 2   hydrOXYzine (ATARAX) 25 MG tablet Take 1 tablet by mouth 3 times per day as needed (Patient taking differently: Take 25 mg by mouth 3 (three) times daily as needed for itching.) 90 tablet 5   ibuprofen (ADVIL,MOTRIN) 200 MG tablet Take 400 mg by mouth daily as needed for moderate pain or headache.     spironolactone (ALDACTONE) 100 MG tablet Take 1 Tablet by mouth once daily 30 tablet 2   triamcinolone ointment (KENALOG) 0.1 % apply a thin layer to the affected area(s) by topical route 2 times per day (Patient taking differently: Apply 1 Application topically  daily as needed (eczema).) 454 g 5   No current facility-administered medications for this visit.   Allergies  Allergen Reactions   Paxil [Paroxetine] Other (See Comments)    Stomach issues Felt "wrong"   Tylenol [Acetaminophen] Other (See Comments)    Told to avoid due to liver issues   Ultram [Tramadol] Nausea Only     Review of Systems: All systems reviewed and negative except where noted in HPI.    No results found.  Physical Exam: There were no vitals taken for this visit. Constitutional: Pleasant,well-developed, ***male in no acute distress. HEENT: Normocephalic and atraumatic. Conjunctivae are normal. No scleral icterus. Neck supple.  Cardiovascular: Normal rate, regular rhythm.  Pulmonary/chest: Effort normal and breath sounds normal. No wheezing, rales or rhonchi. Abdominal: Soft, nondistended, nontender. Bowel sounds active throughout. There are no masses palpable. No hepatomegaly. Extremities: no edema Lymphadenopathy: No cervical adenopathy noted. Neurological: Alert and oriented to person place and time. Skin: Skin is warm and dry. No rashes noted. Psychiatric: Normal mood and affect. Behavior is normal.  CBC    Component Value Date/Time   WBC 7.4 04/12/2022 1805   RBC 3.57 (L) 04/12/2022 1805   HGB 12.7 (L) 04/12/2022 1805   HCT 37.7 (L) 04/12/2022 1805   PLT 199 04/12/2022 1805   MCV 105.6 (H) 04/12/2022 1805   MCH 35.6 (H) 04/12/2022 1805   MCHC 33.7 04/12/2022 1805   RDW 13.4 04/12/2022 1805   LYMPHSABS 2.5 03/10/2022 1848   MONOABS 0.9 03/10/2022 1848   EOSABS 0.1 03/10/2022 1848   BASOSABS 0.1 03/10/2022 1848    CMP     Component Value Date/Time   NA 132 (L) 04/12/2022 1805   K 4.2 04/12/2022 1805   CL 102 04/12/2022 1805   CO2 25 04/12/2022 1805   GLUCOSE 102 (H) 04/12/2022 1805   BUN 11 04/12/2022 1805   CREATININE 0.87 04/12/2022 1805   CALCIUM 7.9 (L) 04/12/2022 1805   PROT 6.0 (L) 04/12/2022 1805   ALBUMIN 2.5 (L) 04/12/2022  1805   AST 61 (H) 04/12/2022 1805   ALT 32 04/12/2022 1805   ALKPHOS 190 (H) 04/12/2022 1805   BILITOT 2.0 (H) 04/12/2022 1805   GFRNONAA >60 04/12/2022 1805   GFRAA >60 02/11/2018 0722   Component Ref Range & Units 2 wk ago 1 mo ago  Prothrombin Time 11.4 - 15.2 seconds 17.8 High  18.9 High   INR 0.8 - 1.2 1.5 High  1.6 High     Ref Range & Units 1 mo ago   GLUCOSE, SERUM 70 - 99 mg/dL 80  BUN 6 - 24 mg/dL 6  CREATININE, SERUM 0.76 - 1.27 mg/dL 0.77  eGFR >59 mL/min/1.73 109  BUN/CREATININE RATIO 9 - 20 8 Low  SODIUM, SERUM 134 - 144 mmol/L 133 Low   POTASSIUM, SERUM 3.5 - 5.2 mmol/L 4.0  CHLORIDE, SERUM 96 - 106 mmol/L 103  CARBON DIOXIDE, TOTAL 20 - 29 mmol/L 27  CALCIUM, SERUM 8.7 - 10.2 mg/dL 7.9 Low   PROTEIN, TOTAL, SERUM 6.0 - 8.5 g/dL 5.9 Low   ALBUMIN, SERUM 4.1 - 5.1 g/dL 3.0 Low   GLOBULIN, TOTAL 1.5 - 4.5 g/dL 2.9  A/G RATIO 1.2 - 2.2 1.0 Low   BILIRUBIN, TOTAL 0.0 - 1.2 mg/dL 2.2 High   ALKALINE PHOSPHATASE, SERUM 44 - 121 IU/L 254 High   AST (SGOT) 0 - 40 IU/L 75 High   ALT (SGPT) 0 - 44 IU/L 32    Component Ref Range & Units 4 yr ago (02/11/18) 4 yr ago (12/10/17) 15 yr ago (01/05/07)  Total Protein 6.5 - 8.1 g/dL 6.6 6.9 7.1 R  Albumin 3.5 - 5.0 g/dL 3.4 Low  3.6 4.2 R  AST 15 - 41 U/L _0 R  ALT 0 - 44 U/L 21 24 R 28 R  Alkaline Phosphatase 38 - 126 U/L 81 87 48 R  Total Bilirubin 0.3 - 1.2 mg/dL 0.8 1.1 0.7  Bilirubin, Direct 0.0 - 0.2 mg/dL 0.2    Indirect Bilirubin 0.3 - 0.9 mg/dL 0.6     Narrative & Impression CLINICAL DATA:  Abdominal pain   EXAM: CT ABDOMEN AND PELVIS WITH CONTRAST   TECHNIQUE: Multidetector CT imaging of the abdomen and pelvis was performed using the standard protocol following bolus administration of intravenous contrast.   RADIATION DOSE REDUCTION: This exam was performed according to the departmental dose-optimization program which includes automated exposure control, adjustment of the mA and/or kV according  to patient size and/or use of iterative reconstruction technique.   CONTRAST:  29m OMNIPAQUE IOHEXOL 350 MG/ML SOLN   COMPARISON:  02/22/2006   FINDINGS: Lower chest: Visualized lower lung fields are clear.   Hepatobiliary: Mild lobulations are seen in the margin of the liver. No focal abnormalities are seen. There is no dilation of bile ducts. Surgical clips are seen in gallbladder fossa.   Pancreas: No focal abnormalities are seen.   Spleen: Spleen measures 13.6 cm in maximum diameter.   Adrenals/Urinary Tract: Adrenals are unremarkable. There is no hydronephrosis. There are no renal or ureteral stones. There are few smooth marginated low-density lesions in the left kidney suggesting possible cysts. Urinary bladder is not distended.   Stomach/Bowel: Small hiatal hernia is seen. Small bowel loops are not dilated. Appendix is not seen. There is no significant wall thickening in colon.   Vascular/Lymphatic: Scattered arterial calcifications are seen.   Reproductive: Prostate appears smaller than usual in size.   Other: There is large ascites. There is no pneumoperitoneum. Small umbilical hernia containing fat is seen. There is subcutaneous edema, possibly anasarca.   Musculoskeletal: No acute findings are seen.   IMPRESSION: There is no evidence of intestinal obstruction or pneumoperitoneum. There is no hydronephrosis.   Lobulations in the margin of liver suggests possible cirrhosis. Spleen is enlarged in size. There is large ascites. Small hiatal hernia.   Other findings as described in the body of the report.     Electronically Signed   By: PElmer PickerM.D.   On: 03/10/2022 20:55    Narrative & Impression CLINICAL DATA:  General abdominal pain   EXAM: ABDOMEN ULTRASOUND COMPLETE   COMPARISON:  03/07/2006   FINDINGS: Gallbladder: Surgically absent   Common bile duct: Diameter: Not visualized  Liver: Coarse nodular appearance favoring  cirrhosis. Poor sonic penetration. Portal vein is patent on color Doppler imaging with normal direction of blood flow towards the liver.   IVC: Not visualized   Pancreas: Not visualized   Spleen: Size and appearance within normal limits. Splenic volume 203 cc (within normal limits).   Right Kidney: Length: 14.3. Echogenicity within normal limits. No mass or hydronephrosis visualized.   Left Kidney: Length: 13.2. Echogenicity within normal limits. No mass or hydronephrosis visualized.   Abdominal aorta: Not visualized   Other findings: At least moderate ascites. Body habitus limits visualization of various structures.   IMPRESSION: 1. Coarse nodular liver favoring cirrhosis. Poor sonic penetration of the liver. 2. Nonvisualization of various structures including the aorta, IVC, pancreas, common bile duct primarily attributable to body habitus. 3. Moderate ascites.     Electronically Signed   By: Van Clines M.D.   On: 03/10/2022 16:08    Narrative  Clinical Data: Post cholecystectomy. Abdominal pain.   ABDOMEN ULTRASOUND:  Technique: Complete abdominal ultrasound examination was performed including evaluation of the liver, gallbladder, bile ducts, pancreas, kidneys, spleen, IVC, and abdominal aorta.  Findings: The gallbladder has previously been removed. The liver has a normal echogenic pattern. No ductal dilatation is seen. The common bile duct is normal measuring 4.7 mm in diameter. Evaluation of the IVC, pancreas, and spleen is limited by bowel gas. No hydronephrosis is noted. The right kidney measures 14.6 cm sagittally with the left kidney measuring 14.5 cm. The abdominal aorta is normal in caliber.   IMPRESSION:  1. Prior cholecystectomy. No ductal dilatation.  2. Bowel gas and body habitus obscure much of the anatomic detail.   ASSESSMENT AND PLAN:  Vassie Moment, MD

## 2022-05-02 NOTE — Patient Instructions (Signed)
_______________________________________________________  If you are age 51 or older, your body mass index should be between 23-30. Your Body mass index is 40.09 kg/m. If this is out of the aforementioned range listed, please consider follow up with your Primary Care Provider.  If you are age 27 or younger, your body mass index should be between 19-25. Your Body mass index is 40.09 kg/m. If this is out of the aformentioned range listed, please consider follow up with your Primary Care Provider.   You have been scheduled for an endoscopy and colonoscopy. Please follow the written instructions given to you at your visit today. Please pick up your prep supplies at the pharmacy within the next 1-3 days. If you use inhalers (even only as needed), please bring them with you on the day of your procedure.   You have been scheduled for an abdominal paracentesis at [facility]  radiology Entrance C (1st floor of hospital) on 05/03/22 at 10am. Please arrive at least 30 minutes prior to your appointment time for registration. Should you need to reschedule this appointment for any reason, please call our office at [phone].  Your provider has requested that you go to the basement level for lab work before leaving today. Press "B" on the elevator. The lab is located at the first door on the left as you exit the elevator. Due to recent changes in healthcare laws, you may see the results of your imaging and laboratory studies on MyChart before your provider has had a chance to review them.  We understand that in some cases there may be results that are confusing or concerning to you. Not all laboratory results come back in the same time frame and the provider may be waiting for multiple results in order to interpret others.  Please give [place] 48 hours in order for your provider to thoroughly review all the results before contacting the office for clarification of your results.   The [place] [facility] providers would like  to encourage you to use [facility] to communicate with providers for non-urgent requests or questions.  Due to long hold times on the telephone, sending your provider a message by [facility] may be a faster and more efficient way to get a response.  Please allow 48 business hours for a response.  Please remember that this is for non-urgent requests.   It was a pleasure to see you today!  Thank you for trusting me with your gastrointestinal care!

## 2022-05-03 ENCOUNTER — Other Ambulatory Visit: Payer: Self-pay

## 2022-05-03 ENCOUNTER — Telehealth: Payer: Self-pay | Admitting: Gastroenterology

## 2022-05-03 ENCOUNTER — Ambulatory Visit (HOSPITAL_COMMUNITY)
Admission: RE | Admit: 2022-05-03 | Discharge: 2022-05-03 | Disposition: A | Discharge status: Home / Self Care | Payer: Medicaid Other | Source: Ambulatory Visit | Attending: Gastroenterology | Admitting: Gastroenterology

## 2022-05-03 DIAGNOSIS — R188 Other ascites: Secondary | ICD-10-CM | POA: Insufficient documentation

## 2022-05-03 DIAGNOSIS — K7469 Other cirrhosis of liver: Secondary | ICD-10-CM

## 2022-05-03 DIAGNOSIS — R195 Other fecal abnormalities: Secondary | ICD-10-CM | POA: Insufficient documentation

## 2022-05-03 HISTORY — PX: IR PARACENTESIS: IMG2679

## 2022-05-03 LAB — BODY FLUID CELL COUNT WITH DIFFERENTIAL
Eos, Fluid: 2 %
Lymphs, Fluid: 74 %
Monocyte-Macrophage-Serous Fluid: 8 % — ABNORMAL LOW (ref 50–90)
Neutrophil Count, Fluid: 16 % (ref 0–25)
Total Nucleated Cell Count, Fluid: 57 cu mm (ref 0–1000)

## 2022-05-03 MED ORDER — ALBUMIN HUMAN 25 % IV SOLN
INTRAVENOUS | Status: AC
  Filled 2022-05-03: qty 200

## 2022-05-03 MED ORDER — NA SULFATE-K SULFATE-MG SULF 17.5-3.13-1.6 GM/177ML PO SOLN
1.0000 | Freq: Once | ORAL | 0 refills | Status: AC
  Filled 2022-05-03: qty 354, 1d supply, fill #0

## 2022-05-03 MED ORDER — ALBUMIN HUMAN 25 % IV SOLN
50.0000 g | Freq: Once | INTRAVENOUS | Status: AC
  Administered 2022-05-03: 50 g via INTRAVENOUS
  Filled 2022-05-03: qty 200

## 2022-05-03 MED ORDER — LIDOCAINE HCL 1 % IJ SOLN
INTRAMUSCULAR | Status: AC
  Administered 2022-05-03: 10 mL
  Filled 2022-05-03: qty 20

## 2022-05-03 NOTE — Telephone Encounter (Signed)
Inbound call from patient in regards to prep medication please advise.

## 2022-05-03 NOTE — Procedures (Signed)
PROCEDURE SUMMARY:  Successful ultrasound guided paracentesis from the left lower quadrant.  Yielded 12.6 of straw colored fluid.  No immediate complications.  The patient tolerated the procedure well.   Specimen wad sent for labs.  EBL < 47m  The patient has required >/=2 paracenteses in a 30 day period and a [facility] has been arranged.

## 2022-05-04 ENCOUNTER — Other Ambulatory Visit: Payer: Self-pay

## 2022-05-04 DIAGNOSIS — R7989 Other specified abnormal findings of blood chemistry: Secondary | ICD-10-CM

## 2022-05-04 DIAGNOSIS — K7469 Other cirrhosis of liver: Secondary | ICD-10-CM

## 2022-05-04 MED ORDER — VITAMIN D (ERGOCALCIFEROL) 1.25 MG (50000 UNIT) PO CAPS
50000.0000 [IU] | ORAL_CAPSULE | ORAL | 0 refills | Status: DC
  Filled 2022-05-04: qty 16, 112d supply, fill #0

## 2022-05-04 NOTE — Progress Notes (Signed)
Erik Howe,  Your lab tests for causes of chronic liver disease were largely unremarkable.  Your iron panel showed evidence of high ferritin and iron saturation.  These findings can sometimes be seen in the setting of cirrhosis by itself, but may also indicate a hereditary disorder of iron metabolism called hemochromatosis.  I would like to test for this further with another blood test that checks for genes associated with hemochromatosis.  Your blood chemistries looked good, with good renal function and normal electrolytes.  Because of this I think you should increase your Lasix (furosemide) to 40 mg twice a day and your Aldactone to 100 mg twice a day.  I would like to repeat labs in 2 weeks to make sure you are renal function and electrolytes are staying stable.  I am hopeful that this will reduce the frequency that you need to get paracentesis.  It is very important that you follow a low-sodium diet.  Your blood tests show that you are immune to hepatitis A, but not hepatitis B.  It is recommended that all patients with liver disease be considered for vaccination hepatitis B.  We can provide this vaccine for you if you are interested.  We can place a standing order for therapeutic paracentesis, every 2 weeks.  However, I am hopeful that once we optimize your diuretics in your diet, you will not need this nearly so often.  Your vitamin D levels were quite low.  You may benefit from taking a vitamin D supplement to get the levels within normal range.  [name], Can you please place an order for an HFE gene test and a repeat BMP in 2 weeks?   Also, can you please place an order for ergocalciferol 50,000 units weekly for 16 weeks? I believe that [name] has already placed the orders for standing therapeutic paracentesis.

## 2022-05-05 LAB — IGA: Immunoglobulin A: 815 mg/dL — ABNORMAL HIGH (ref 47–310)

## 2022-05-05 LAB — ANTI-SMOOTH MUSCLE ANTIBODY, IGG: Actin (Smooth Muscle) Antibody (IGG): <20 U (ref <20)

## 2022-05-05 LAB — TISSUE TRANSGLUTAMINASE, IGA: (tTG) Ab, IgA: <1 U/mL

## 2022-05-05 LAB — AFP TUMOR MARKER: AFP-Tumor Marker: 4.9 ng/mL (ref <6.1)

## 2022-05-05 LAB — HEPATITIS B SURFACE ANTIBODY,QUALITATIVE: Hep B S Ab: NONREACTIVE

## 2022-05-05 LAB — ALPHA-1-ANTITRYPSIN: A-1 Antitrypsin, Ser: 121 mg/dL (ref 83–199)

## 2022-05-05 LAB — HEPATITIS B SURFACE ANTIGEN: Hepatitis B Surface Ag: NONREACTIVE

## 2022-05-05 LAB — HEPATITIS A ANTIBODY, TOTAL: Hepatitis A AB,Total: REACTIVE — AB

## 2022-05-05 LAB — PATHOLOGIST SMEAR REVIEW

## 2022-05-05 LAB — MITOCHONDRIAL ANTIBODIES: Mitochondrial M2 Ab, IgG: <=20 U (ref <=20.0)

## 2022-05-05 LAB — ANA: Anti Nuclear Antibody (ANA): NEGATIVE

## 2022-05-05 LAB — HEPATITIS C ANTIBODY: Hepatitis C Ab: NONREACTIVE

## 2022-05-05 LAB — CERULOPLASMIN: Ceruloplasmin: 25 mg/dL (ref 18–36)

## 2022-05-06 LAB — AEROBIC CULTURE W GRAM STAIN (SUPERFICIAL SPECIMEN)
Culture: NO GROWTH
Gram Stain: NONE SEEN

## 2022-05-09 ENCOUNTER — Other Ambulatory Visit: Payer: Self-pay

## 2022-05-09 MED ORDER — NAPROXEN 375 MG PO TABS
375.0000 mg | ORAL_TABLET | Freq: Three times a day (TID) | ORAL | 1 refills | Status: DC
  Filled 2022-05-09: qty 90, 30d supply, fill #0

## 2022-05-09 MED ORDER — ONDANSETRON 8 MG PO TBDP
8.0000 mg | ORAL_TABLET | Freq: Three times a day (TID) | ORAL | 0 refills | Status: DC | PRN
  Filled 2022-05-09: qty 90, 30d supply, fill #0

## 2022-05-09 NOTE — Telephone Encounter (Signed)
Patient is calling in regards to his Aldactone and Lasix Rx states that his dosage is supposed to be doubled. Please advise

## 2022-05-10 ENCOUNTER — Other Ambulatory Visit: Payer: Self-pay

## 2022-05-10 MED ORDER — SPIRONOLACTONE 100 MG PO TABS
100.0000 mg | ORAL_TABLET | Freq: Two times a day (BID) | ORAL | 2 refills | Status: DC
  Filled 2022-05-10: qty 30, 15d supply, fill #0

## 2022-05-10 MED ORDER — FUROSEMIDE 40 MG PO TABS
40.0000 mg | ORAL_TABLET | Freq: Two times a day (BID) | ORAL | 2 refills | Status: DC
  Filled 2022-05-10: qty 30, 15d supply, fill #0

## 2022-05-10 NOTE — Telephone Encounter (Signed)
Called patient and informed him that dosage is supposed to doubled and a new prescription has been sent in .

## 2022-05-11 ENCOUNTER — Other Ambulatory Visit: Payer: Self-pay

## 2022-05-18 ENCOUNTER — Ambulatory Visit (HOSPITAL_COMMUNITY)
Admission: RE | Admit: 2022-05-18 | Discharge: 2022-05-18 | Disposition: A | Discharge status: Home / Self Care | Payer: Self-pay | Source: Ambulatory Visit | Attending: Gastroenterology | Admitting: Gastroenterology

## 2022-05-18 DIAGNOSIS — K7469 Other cirrhosis of liver: Secondary | ICD-10-CM | POA: Insufficient documentation

## 2022-05-18 DIAGNOSIS — R188 Other ascites: Secondary | ICD-10-CM | POA: Diagnosis present

## 2022-05-18 HISTORY — PX: IR PARACENTESIS: IMG2679

## 2022-05-18 LAB — BODY FLUID CELL COUNT WITH DIFFERENTIAL
Eos, Fluid: 0 %
Lymphs, Fluid: 93 %
Monocyte-Macrophage-Serous Fluid: 3 % — ABNORMAL LOW (ref 50–90)
Neutrophil Count, Fluid: 4 % (ref 0–25)
Total Nucleated Cell Count, Fluid: 681 cu mm (ref 0–1000)

## 2022-05-18 MED ORDER — ALBUMIN HUMAN 25 % IV SOLN: 100.0000 g | Freq: Once | INTRAVENOUS | Status: AC

## 2022-05-18 MED ORDER — ALBUMIN HUMAN 25 % IV SOLN
INTRAVENOUS | Status: AC
  Administered 2022-05-18: 100 g via INTRAVENOUS
  Filled 2022-05-18: qty 400

## 2022-05-18 MED ORDER — LIDOCAINE HCL 1 % IJ SOLN
INTRAMUSCULAR | Status: AC
  Filled 2022-05-18: qty 20

## 2022-05-19 ENCOUNTER — Telehealth (HOSPITAL_COMMUNITY): Payer: Self-pay | Admitting: Physician Assistant

## 2022-05-19 ENCOUNTER — Other Ambulatory Visit: Payer: Self-pay

## 2022-05-19 ENCOUNTER — Telehealth: Payer: Self-pay | Admitting: Gastroenterology

## 2022-05-19 DIAGNOSIS — K7469 Other cirrhosis of liver: Secondary | ICD-10-CM

## 2022-05-19 LAB — TRIGLYCERIDES, BODY FLUIDS: Triglycerides, Fluid: 178 mg/dL

## 2022-05-19 NOTE — Progress Notes (Signed)
Patient ID: Erik Howe, male   DOB: [birth year], 51 y.o.   MRN: [id number]  Patient calls one day after high-volume paracentesis.  He reports experiencing pain since arriving home yesterday.  The pain is limited to each bite he eats.  The pain occurs immediately, lasts 20-30 seconds, and traverses his left side, from rib cage to pelvis, in line with the paracentesis drainage site.  He also endorses a mild nausea while noting he did not have his regular bowel movement this morning.  Denies rash, fever/chills, vomiting, diarrhea, change in skin color, weakness, LOC, or increased shortness of breath.  Denies pain interfering with sleep.  Denies oozing from puncture site.  Highly doubt symptom is a complication from paracentesis and explained this to patient.  Presentation is not consistent with concern for bleeding, structure damage, or development of infection.  Suggested patient could be evaluated at ED if he thinks he needs to be assessed imminently or schedule with his GI office for further workup.  Recommended proceeding to ED if pain worsens or has additional symptoms.  He is hesitant to be seen in ED due to presumed wait time, but would like for someone to put eyes on him.  I informed patient I would let GI office know his concern as well.

## 2022-05-19 NOTE — Telephone Encounter (Signed)
Spoke with pt and he states when he eats or drinks anything within 30 seconds he has pain in his chest that goes to the bottom of his torso and sometimes around to his shoulder. He takes pepcid once a day. Suggested he try taking it BID and if he continues to have this pain to see if he could be seen by his PCP or urgent care as we do not have any soon opening. Dr. Candis Schatz notified.

## 2022-05-19 NOTE — Telephone Encounter (Signed)
Patient is calling states his left side from his chest to his lower abdomen hurts when he tries to eat anything from where he had it drained. Please advise

## 2022-05-19 NOTE — Telephone Encounter (Signed)
Inbound call from [facility] at [facility] IR with a call back number at [phone]. Please advise.

## 2022-05-19 NOTE — Telephone Encounter (Signed)
Pt aware, order in epic. 

## 2022-05-19 NOTE — Telephone Encounter (Signed)
-----   Message from Algernon Huxley, RN sent at 05/04/2022  3:31 PM EST ----- Regarding: BMP Pt needs BMP in 2 weeks, place order

## 2022-05-19 NOTE — Telephone Encounter (Signed)
Pt had IR para done 11/29. Called IR and spoke with them regarding pts complaints. IR is going to have one of their PA's call the pt.

## 2022-05-20 ENCOUNTER — Other Ambulatory Visit (INDEPENDENT_AMBULATORY_CARE_PROVIDER_SITE_OTHER): Payer: Medicaid Other

## 2022-05-20 DIAGNOSIS — K7469 Other cirrhosis of liver: Secondary | ICD-10-CM | POA: Diagnosis not present

## 2022-05-20 LAB — BASIC METABOLIC PANEL
BUN: 11 mg/dL (ref 6–23)
CO2: 27 mEq/L (ref 19–32)
Calcium: 8 mg/dL — ABNORMAL LOW (ref 8.4–10.5)
Chloride: 97 mEq/L (ref 96–112)
Creatinine, Ser: 1 mg/dL (ref 0.40–1.50)
GFR: 87.35 mL/min (ref >60.00)
Glucose, Bld: 116 mg/dL — ABNORMAL HIGH (ref 70–99)
Potassium: 4.7 mEq/L (ref 3.5–5.1)
Sodium: 130 mEq/L — ABNORMAL LOW (ref 135–145)

## 2022-05-20 LAB — CYTOLOGY - NON PAP

## 2022-05-20 NOTE — Progress Notes (Signed)
I spoke with the patient today and he reports that his abdominal pain is doing better.  He has been taking some Gas-X which seems to help.  He is also not had a bowel movement in a few days, so he also bought some Ex-Lax and will take this tomorrow if he is still not had a bowel movement.  Very low concern for complication from his paracentesis.  He has not noticed a significant output of his urine from the increase in his diuretics, and he still had 16 L of fluid removed during his most recent paracentesis.  I am not very optimistic that we will be able to control his ascites effectively with medications and diet.  However, we will continue to titrate up his diuretics. For now, I recommended he increase his Lasix to 80 mg in the morning and 40 mg in the evening, and continue spironolactone 100 mg twice daily.  I reinforced the importance of salt restriction.  He has an order for HFE gene testing, but this was not ran with his most recent blood draw.  We will contact the lab to figure out how to get this test completed.  He does not have an appointment yet for his next paracentesis and did not seem to know how to make an appointment.  [name], Can you help him get set up with a repeat paracentesis in 2 weeks and contact the lab to see why his HFE test was not performed with his most recent blood draw?

## 2022-05-20 NOTE — Progress Notes (Signed)
   Portal Hypertension Clinic Screening Evaluation   Indication for evaluation: Erik Howe is a 51 y.o. male undergoing preliminary evaluation in the [facility] Portal [facility]pertension [facility] due to recurrent ascites.  Referring Physician/Established Gastroenterologist:  Dustin Flock, MD  Etiology of cirrhosis: Indeterminate, undergoing workup Initially diagnosed: September 2023 # of paracentesis in last month: 2 # of paracentesis in last 2 months: 3 History of hepatic hydrothorax:  No History of hepatic encephalopathy: No  Prior evaluation for liver transplant: No History of hepatocellular carcinoma: No  Prior esophagogastroduodenoscopy/intervention: No Current esophageal varices: Yes, small on CT Current gastric varices: No History of hematemesis: No  Current diuretic regimen: furosemide 40 mg BID, spironolactone 100 mg BID Current pharmacologic encephalopathy prophylaxis/treatment: none  History of renal dysfunction: No History of hemodialysis: No  History of cardiac dysfunction: No  Other pertinent past medical history: depression, gastroesophageal reflux disease, hypertension, obesity   Imaging: Prior cross sectional imaging of portal system: CT AP 03/10/22  Patent portal system.  Small esophageal varices.  No ectopic varices.  Large volume ascites.  Echocardiogram: None available   Labs: 05/02/22 Creatinine: 0.78 Total Bilirubin: 1.5 INR: 1.5 Sodium: 135 Albumin: 2.9  Child-Pugh = 8 points, class B MELD = 13 (6.0% estimated 3 month mortality) [place] Index of Post-TIPS Survival (FIPS) = -0.69 (overall survival predicted at 1 month 97.7%, 3 months 92.4%, and 6 months 88.8%)    Assessment: Erik Howe is a 51 y.o. male with history of recent diagnosed cirrhosis of indeterminate etiology (Child Pugh B, MELD 13) and recurrent ascites.  After preliminary evaluation, this patient would be a good candidate for TIPS creation if  ascites is unable to be controlled with diuretics.    Recommendation: Formal consult for TIPS creation could be considered.  The patient's Gastroenterologist, Dr. Candis Schatz, will be contacted for further discussion.    Electronically Signed: Suzette Battiest, MD 05/20/2022, 9:55 AM

## 2022-05-23 ENCOUNTER — Other Ambulatory Visit: Payer: Self-pay

## 2022-05-23 DIAGNOSIS — K7469 Other cirrhosis of liver: Secondary | ICD-10-CM

## 2022-05-25 ENCOUNTER — Other Ambulatory Visit: Payer: No Typology Code available for payment source

## 2022-05-25 DIAGNOSIS — K7469 Other cirrhosis of liver: Secondary | ICD-10-CM

## 2022-05-26 ENCOUNTER — Other Ambulatory Visit: Payer: Self-pay

## 2022-05-26 ENCOUNTER — Telehealth: Payer: Self-pay | Admitting: Gastroenterology

## 2022-05-26 MED ORDER — SPIRONOLACTONE 100 MG PO TABS
100.0000 mg | ORAL_TABLET | Freq: Two times a day (BID) | ORAL | 2 refills | Status: DC
  Filled 2022-05-26: qty 60, 30d supply, fill #0
  Filled 2022-06-02: qty 30, 15d supply, fill #0
  Filled 2022-06-23: qty 30, 15d supply, fill #1
  Filled 2022-07-07: qty 30, 15d supply, fill #2

## 2022-05-26 MED ORDER — FUROSEMIDE 40 MG PO TABS
40.0000 mg | ORAL_TABLET | Freq: Two times a day (BID) | ORAL | 2 refills | Status: DC
  Filled 2022-05-26 – 2022-06-02 (×2): qty 30, 15d supply, fill #0

## 2022-05-26 NOTE — Telephone Encounter (Signed)
Medications have been refilled ?

## 2022-05-26 NOTE — Telephone Encounter (Signed)
Patient is calling states his Lasix and Spironolactone prescriptions are going to be running out sometime this weekend. Please advise

## 2022-05-30 ENCOUNTER — Other Ambulatory Visit: Payer: Self-pay

## 2022-06-01 LAB — HEMOCHROMATOSIS DNA-PCR(C282Y,H63D)

## 2022-06-02 ENCOUNTER — Other Ambulatory Visit: Payer: Self-pay

## 2022-06-02 ENCOUNTER — Ambulatory Visit (HOSPITAL_COMMUNITY)
Admission: RE | Admit: 2022-06-02 | Discharge: 2022-06-02 | Disposition: A | Discharge status: Home / Self Care | Payer: Medicaid Other | Source: Ambulatory Visit | Attending: Gastroenterology | Admitting: Gastroenterology

## 2022-06-02 DIAGNOSIS — R188 Other ascites: Secondary | ICD-10-CM | POA: Diagnosis not present

## 2022-06-02 DIAGNOSIS — K7469 Other cirrhosis of liver: Secondary | ICD-10-CM | POA: Insufficient documentation

## 2022-06-02 HISTORY — PX: IR PARACENTESIS: IMG2679

## 2022-06-02 LAB — BODY FLUID CELL COUNT WITH DIFFERENTIAL
Eos, Fluid: 0 %
Lymphs, Fluid: 77 %
Monocyte-Macrophage-Serous Fluid: 16 % — ABNORMAL LOW (ref 50–90)
Neutrophil Count, Fluid: 7 % (ref 0–25)
Total Nucleated Cell Count, Fluid: 1308 cu mm — ABNORMAL HIGH (ref 0–1000)

## 2022-06-02 MED ORDER — ALBUMIN HUMAN 25 % IV SOLN
50.0000 g | Freq: Once | INTRAVENOUS | Status: AC
  Filled 2022-06-02: qty 200

## 2022-06-02 MED ORDER — LIDOCAINE HCL 1 % IJ SOLN
INTRAMUSCULAR | Status: AC
  Filled 2022-06-02: qty 20

## 2022-06-02 MED ORDER — ALBUMIN HUMAN 25 % IV SOLN
INTRAVENOUS | Status: AC
  Administered 2022-06-02: 50 g via INTRAVENOUS
  Filled 2022-06-02: qty 200

## 2022-06-02 MED ORDER — ALBUMIN HUMAN 25 % IV SOLN
INTRAVENOUS | Status: AC
  Filled 2022-06-02: qty 100

## 2022-06-02 NOTE — Procedures (Signed)
PROCEDURE SUMMARY:  Successful image-guided paracentesis from the right lower abdomen.  Yielded 9.4 liters of hazy amber fluid.  No immediate complications.  EBL = trace. Patient tolerated well.   Specimen was  sent for labs.  Please see imaging section of Epic for full dictation.  The patient has required >/=2 paracenteses in a 30 day period and a screening evaluation by the [place] [facility] has been arranged.   Wendel Homeyer H Paiden Caraveo PA-C 06/02/2022 10:30 AM

## 2022-06-03 ENCOUNTER — Other Ambulatory Visit: Payer: Self-pay

## 2022-06-03 ENCOUNTER — Telehealth: Payer: Self-pay | Admitting: Gastroenterology

## 2022-06-03 DIAGNOSIS — K7469 Other cirrhosis of liver: Secondary | ICD-10-CM

## 2022-06-03 NOTE — Telephone Encounter (Signed)
Pt just had IR para done yesterday and had 9 liters of fluid drawn off. The standing orders state for him to have a para every 2 weeks. Pt is calling wanting to have one done next week prior to his endo/colon. Please advise.

## 2022-06-03 NOTE — Telephone Encounter (Signed)
Inbound call from patient requesting a call back to discuss if he can get a paracentesis sometime next week before he has his endo and colon. Please advise.

## 2022-06-03 NOTE — Telephone Encounter (Signed)
Pt scheduled for IR para at [facility] 06/07/22 at 10am, pt to arrive at 9:30am. Pt to go in through entrance C, pt aware of appt.

## 2022-06-06 LAB — CYTOLOGY - NON PAP

## 2022-06-07 ENCOUNTER — Other Ambulatory Visit (HOSPITAL_COMMUNITY): Payer: No Typology Code available for payment source

## 2022-06-07 ENCOUNTER — Other Ambulatory Visit: Payer: Self-pay

## 2022-06-07 MED ORDER — ONDANSETRON 8 MG PO TBDP
8.0000 mg | ORAL_TABLET | Freq: Three times a day (TID) | ORAL | 0 refills | Status: DC | PRN
  Filled 2022-06-07: qty 90, 30d supply, fill #0

## 2022-06-08 ENCOUNTER — Other Ambulatory Visit: Payer: Self-pay

## 2022-06-10 ENCOUNTER — Other Ambulatory Visit: Payer: Self-pay

## 2022-06-10 ENCOUNTER — Ambulatory Visit (HOSPITAL_COMMUNITY)
Admission: RE | Admit: 2022-06-10 | Discharge: 2022-06-10 | Disposition: A | Discharge status: Home / Self Care | Payer: Medicaid Other | Source: Ambulatory Visit | Attending: Gastroenterology | Admitting: Gastroenterology

## 2022-06-10 DIAGNOSIS — K7469 Other cirrhosis of liver: Secondary | ICD-10-CM | POA: Insufficient documentation

## 2022-06-10 DIAGNOSIS — R188 Other ascites: Secondary | ICD-10-CM | POA: Insufficient documentation

## 2022-06-10 HISTORY — PX: IR PARACENTESIS: IMG2679

## 2022-06-10 LAB — BODY FLUID CELL COUNT WITH DIFFERENTIAL
Lymphs, Fluid: 77 %
Monocyte-Macrophage-Serous Fluid: 4 % — ABNORMAL LOW (ref 50–90)
Neutrophil Count, Fluid: 7 % (ref 0–25)
Other Cells, Fluid: 12 %
Total Nucleated Cell Count, Fluid: 560 cu mm (ref 0–1000)

## 2022-06-10 MED ORDER — LIDOCAINE HCL 1 % IJ SOLN
INTRAMUSCULAR | Status: AC
  Administered 2022-06-10: 8 mL via INTRADERMAL
  Filled 2022-06-10: qty 20

## 2022-06-10 MED ORDER — ALBUMIN HUMAN 25 % IV SOLN
INTRAVENOUS | Status: AC
  Filled 2022-06-10: qty 300

## 2022-06-10 MED ORDER — ALBUMIN HUMAN 25 % IV SOLN
75.0000 g | Freq: Once | INTRAVENOUS | Status: AC
  Administered 2022-06-10: 50 g via INTRAVENOUS
  Filled 2022-06-10: qty 300

## 2022-06-10 MED ORDER — LIDOCAINE HCL 1 % IJ SOLN: 20.0000 mL | Freq: Once | INTRAMUSCULAR | Status: DC

## 2022-06-10 NOTE — Procedures (Addendum)
PROCEDURE SUMMARY:  Successful ultrasound guided paracentesis from the right lower quadrant.  Yielded 6 L of cloudy blood-tinged fluid  No immediate complications.  The patient tolerated the procedure well.   Specimen sent for labs.  EBL < 60m  The patient has required >/=2 paracenteses in a 30 day period and a [facility] has been arranged.  ** Dr. SSerafina Royalshas screened this patient for possible TIPS and a note has been sent to the patient's GI team.    JSoyla Dryer AGACNP-BC [phone]/22/2023, 12:48 PM

## 2022-06-15 LAB — PATHOLOGIST SMEAR REVIEW

## 2022-06-17 ENCOUNTER — Other Ambulatory Visit: Payer: Self-pay

## 2022-06-17 ENCOUNTER — Ambulatory Visit (AMBULATORY_SURGERY_CENTER): Payer: Medicaid Other | Admitting: Gastroenterology

## 2022-06-17 ENCOUNTER — Encounter: Payer: Self-pay | Admitting: Gastroenterology

## 2022-06-17 VITALS — BP 112/68 | HR 89 | Temp 98.0°F | Resp 16 | Ht 74.0 in | Wt 312.0 lb

## 2022-06-17 DIAGNOSIS — R195 Other fecal abnormalities: Secondary | ICD-10-CM | POA: Diagnosis not present

## 2022-06-17 DIAGNOSIS — Z1211 Encounter for screening for malignant neoplasm of colon: Secondary | ICD-10-CM | POA: Diagnosis not present

## 2022-06-17 DIAGNOSIS — D122 Benign neoplasm of ascending colon: Secondary | ICD-10-CM | POA: Diagnosis not present

## 2022-06-17 DIAGNOSIS — D123 Benign neoplasm of transverse colon: Secondary | ICD-10-CM

## 2022-06-17 DIAGNOSIS — K766 Portal hypertension: Secondary | ICD-10-CM | POA: Diagnosis not present

## 2022-06-17 DIAGNOSIS — K7469 Other cirrhosis of liver: Secondary | ICD-10-CM

## 2022-06-17 DIAGNOSIS — K219 Gastro-esophageal reflux disease without esophagitis: Secondary | ICD-10-CM | POA: Diagnosis not present

## 2022-06-17 DIAGNOSIS — R7989 Other specified abnormal findings of blood chemistry: Secondary | ICD-10-CM

## 2022-06-17 DIAGNOSIS — D125 Benign neoplasm of sigmoid colon: Secondary | ICD-10-CM

## 2022-06-17 DIAGNOSIS — K269 Duodenal ulcer, unspecified as acute or chronic, without hemorrhage or perforation: Secondary | ICD-10-CM

## 2022-06-17 DIAGNOSIS — D127 Benign neoplasm of rectosigmoid junction: Secondary | ICD-10-CM

## 2022-06-17 DIAGNOSIS — D128 Benign neoplasm of rectum: Secondary | ICD-10-CM

## 2022-06-17 DIAGNOSIS — R188 Other ascites: Secondary | ICD-10-CM

## 2022-06-17 DIAGNOSIS — R197 Diarrhea, unspecified: Secondary | ICD-10-CM

## 2022-06-17 MED ORDER — FUROSEMIDE 80 MG PO TABS
ORAL_TABLET | ORAL | 3 refills | Status: DC
  Filled 2022-06-17: qty 135, 90d supply, fill #0

## 2022-06-17 MED ORDER — OMEPRAZOLE 20 MG PO CPDR
20.0000 mg | DELAYED_RELEASE_CAPSULE | Freq: Two times a day (BID) | ORAL | 1 refills | Status: DC
  Filled 2022-06-17: qty 60, 30d supply, fill #0
  Filled 2022-07-07 – 2022-07-11 (×2): qty 60, 30d supply, fill #1

## 2022-06-17 MED ORDER — SUCRALFATE 1 G PO TABS
1.0000 g | ORAL_TABLET | Freq: Four times a day (QID) | ORAL | 0 refills | Status: DC
  Filled 2022-06-17: qty 56, 14d supply, fill #0

## 2022-06-17 MED ORDER — SODIUM CHLORIDE 0.9 % IV SOLN: 500.0000 mL | Freq: Once | INTRAVENOUS | Status: DC

## 2022-06-17 NOTE — Patient Instructions (Signed)
Discharge instructions given. Prescriptions sent to pharmacy. Avoid all NSAIDS. Repeat upper Endoscopy in 8 weeks to check for healing. Will plan to do this in the hospital setting at time of repeat colonoscopy. Handouts on Esophagitis,Hiatal Hernia polyps,Hemorrhoids.  Recommend colonoscopy at the next available appointment because the exam was incomplete. Recommend be preformed in hospital setting with the use of Colowrap or other abdominal binder. Recommend colonoscopy be preformed within 3 days following a therapeutic paracentesis. Resume previous medications. YOU HAD AN ENDOSCOPIC PROCEDURE TODAY AT [facility]:   Refer to the procedure report that was given to you for any specific questions about what was found during the examination.  If the procedure report does not answer your questions, please call your gastroenterologist to clarify.  If you requested that your care partner not be given the details of your procedure findings, then the procedure report has been included in a sealed envelope for you to review at your convenience later.  YOU SHOULD EXPECT: Some feelings of bloating in the abdomen. Passage of more gas than usual.  Walking can help get rid of the air that was put into your GI tract during the procedure and reduce the bloating. If you had a lower endoscopy (such as a colonoscopy or flexible sigmoidoscopy) you may notice spotting of blood in your stool or on the toilet paper. If you underwent a bowel prep for your procedure, you may not have a normal bowel movement for a few days.  Please Note:  You might notice some irritation and congestion in your nose or some drainage.  This is from the oxygen used during your procedure.  There is no need for concern and it should clear up in a day or so.  SYMPTOMS TO REPORT IMMEDIATELY:  Following lower endoscopy (colonoscopy or flexible sigmoidoscopy):  Excessive amounts of blood in the stool  Significant tenderness or  worsening of abdominal pains  Swelling of the abdomen that is new, acute  Fever of 100F or higher  Following upper endoscopy (EGD)  Vomiting of blood or coffee ground material  New chest pain or pain under the shoulder blades  Painful or persistently difficult swallowing  New shortness of breath  Fever of 100F or higher  Black, tarry-looking stools  For urgent or emergent issues, a gastroenterologist can be reached at any hour by calling [phone]. Do not use MyChart messaging for urgent concerns.    DIET:  We do recommend a small meal at first, but then you may proceed to your regular diet.  Drink plenty of fluids but you should avoid alcoholic beverages for 24 hours.  ACTIVITY:  You should plan to take it easy for the rest of today and you should NOT DRIVE or use heavy machinery until tomorrow (because of the sedation medicines used during the test).    FOLLOW UP: Our staff will call the number listed on your records the next business day following your procedure.  We will call around 7:15- 8:00 am to check on you and address any questions or concerns that you may have regarding the information given to you following your procedure. If we do not reach you, we will leave a message.     If any biopsies were taken you will be contacted by phone or by letter within the next 1-3 weeks.  Please call us at [phone] if you have not heard about the biopsies in 3 weeks.    SIGNATURES/CONFIDENTIALITY: You and/or your care partner have  signed paperwork which will be entered into your electronic medical record.  These signatures attest to the fact that that the information above on your After Visit Summary has been reviewed and is understood.  Full responsibility of the confidentiality of this discharge information lies with you and/or your care-partner.

## 2022-06-17 NOTE — Op Note (Signed)
Erik Howe: Erik Howe Procedure Date: 06/17/2022 1:09 PM MRN: [id number] Endoscopist: Nicki Reaper [name] , MD, [id number] Age: 51 Referring MD:  Date of Birth: 23-Jun-1970 Gender: Male Account #: [id number] Procedure:                Colonoscopy Indications:              Positive fecal immunochemical test Medicines:                Monitored Anesthesia Care Procedure:                Pre-Anesthesia Assessment:                           - Prior to the procedure, a History and Physical                            was performed, and patient medications and                            allergies were reviewed. The patient's tolerance of                            previous anesthesia was also reviewed. The risks                            and benefits of the procedure and the sedation                            options and risks were discussed with the patient.                            All questions were answered, and informed consent                            was obtained. Prior Anticoagulants: The patient has                            taken no anticoagulant or antiplatelet agents. ASA                            Grade Assessment: III - A patient with severe                            systemic disease. After reviewing the risks and                            benefits, the patient was deemed in satisfactory                            condition to undergo the procedure.                           After obtaining informed consent, the colonoscope  was passed under direct vision. Throughout the                            procedure, the patient's blood pressure, pulse, and                            oxygen saturations were monitored continuously. The                            CF [id number] #[id number] was introduced through the anus                            with the intention of advancing to the cecum. The                            scope was  advanced to the ascending colon before                            the procedure was aborted due to inability to                            intubate cecum. Medications were given. The                            colonoscopy was extremely difficult due to poor                            endoscopic visualization secondary to difficulty                            maintaining insufflation/colonic distention, a                            redundant colon, significant looping and the                            patient's excessive coughing and retching, despite                            changing the patient to a supine position, changing                            the patient to a prone position, using manual                            pressure, using water insufflation and withdrawing                            and reinserting the scope. The patient tolerated                            the procedure. The quality of the bowel preparation  was fair. The ileocecal valve and the rectum were                            photographed. The bowel preparation used was SUPREP                            via split dose instruction. Scope In: 1:38:49 PM Scope Out: 2:41:06 PM Total Procedure Duration: 1 hour 2 minutes 17 seconds  Findings:                 The perianal and digital rectal examinations were                            normal. Pertinent negatives include normal                            sphincter tone and no palpable rectal lesions.                           Six sessile polyps were found in the ascending                            colon. The polyps were 3 to 8 mm in size. These                            polyps were removed with a cold snare. Resection                            and retrieval were complete. Estimated blood loss                            was minimal.                           Five sessile polyps were found in the transverse                            colon.  The polyps were 3 to 7 mm in size. These                            polyps were removed with a cold snare. Resection                            and retrieval were complete. Estimated blood loss                            was minimal.                           A 10 mm polyp was found in the sigmoid colon. The                            polyp was semi-pedunculated. The polyp was removed  with a cold snare. Resection and retrieval were                            complete. Estimated blood loss was minimal.                           An 8 mm polyp was found in the rectum. The polyp                            was pedunculated. The polyp was removed with a cold                            snare. Resection and retrieval were complete.                            Estimated blood loss was minimal. To prevent                            bleeding after mucosal resection, one hemostatic                            clip was successfully placed (MR conditional).                            There was no bleeding at the end of the procedure.                           A few small angioectasias without bleeding were                            found in the ascending colon.                           Non-bleeding hemorrhoids were found during                            retroflexion. The hemorrhoids were Grade I                            (internal hemorrhoids that do not prolapse). The                            mucosa at the dentate line appeared                            inflamed/ulcerated.                           The exam was otherwise normal throughout the                            examined colon.                           No additional abnormalities were  found on                            retroflexion. Complications:            No immediate complications. Estimated Blood Loss:     Estimated blood loss was minimal. Impression:               - Preparation of the colon was fair.                            - Six 3 to 8 mm polyps in the ascending colon,                            removed with a cold snare. Resected and retrieved.                           - Five 3 to 7 mm polyps in the transverse colon,                            removed with a cold snare. Resected and retrieved.                           - One 10 mm polyp in the sigmoid colon, removed                            with a cold snare. Resected and retrieved.                           - One 8 mm polyp in the rectum, removed with a cold                            snare. Resected and retrieved. Clip (MR                            conditional) was placed.                           - A few non-bleeding colonic angioectasias.                           - Non-bleeding hemorrhoids. Recommendation:           - Patient has a contact number available for                            emergencies. The signs and symptoms of potential                            delayed complications were discussed with the                            patient. Return to normal activities tomorrow.  Written discharge instructions were provided to the                            patient.                           - Resume previous diet.                           - Await pathology results.                           - Repeat colonoscopy at the next available                            appointment because the examination was incomplete.                           - Recommend daily fiber supplementation to reduced                            hemorrhoidal irritation/inflamed dentate line                           - Recommend colonoscopy be performed in hospital                            setting with use of ColoWrap or other abdominal                            binder.                           - Recommend colonoscopy be performed within 3 days                            following a therapeutic paracentesis. Jaymee Tilson E.  Candis Schatz, MD 06/17/2022 3:20:58 PM This report has been signed electronically.

## 2022-06-17 NOTE — Progress Notes (Signed)
[facility] History and Physical   Primary Care Physician:  Patient, No Pcp Per   Reason for Procedure:   Positive FIT, variceal screening  Plan:    EGD, colonoscopy     HPI: Erik Howe is a 51 y.o. male undergoing colonoscopy after having a positive FIT test.  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  He has no family history of colon cancer.  He had chronic loose stools, typically 4-5 per day, but more recently he has had problems with constipation.  He has chronic nausea and generalized abdominal discomfort.  He has occasional bright red blood per rectum and had more profuse bleeding during his bowel prep.   Past Medical History:  Diagnosis Date   Arthritis    Cellulitis 12/18/2012   left foot   Cirrhosis of liver (HCC)    Depression    GERD (gastroesophageal reflux disease)    Hypertension    Obesity    Varicose veins     Past Surgical History:  Procedure Laterality Date   CHOLECYSTECTOMY     ENDOVENOUS ABLATION SAPHENOUS VEIN W/ LASER Left 09-30-2013   left greater saphenous vein   IR PARACENTESIS  05/03/2022   IR PARACENTESIS  05/18/2022   IR PARACENTESIS  06/02/2022   IR PARACENTESIS  06/10/2022    Prior to Admission medications   Medication Sig Start Date End Date Taking? Authorizing Provider  albuterol (VENTOLIN HFA) 108 (90 Base) MCG/ACT inhaler Inhale 2 puffs into the lungs every 6 (six) hours as needed for shortness of breath 04/11/22  Yes   furosemide (LASIX) 40 MG tablet Take 1 tablet (40 mg total) by mouth 2 (two) times daily. 05/26/22  Yes Daryel November, MD  ondansetron (ZOFRAN-ODT) 8 MG disintegrating tablet Take 1 tablet (8 mg total) by mouth 3 (three) times daily as needed for nausea. 06/07/22  Yes   spironolactone (ALDACTONE) 100 MG tablet Take 1 tablet (100 mg total) by mouth 2 (two) times daily. 05/26/22  Yes Daryel November, MD  aspirin EC 81 MG tablet Take  81 mg by mouth daily. Swallow whole.    [provider]  cyclobenzaprine (FLEXERIL) 10 MG tablet Take 1 Tablet by mouth nightly at bedtime as needed for muscle spasms 12/15/21     hydrOXYzine (ATARAX) 25 MG tablet Take 1 tablet by mouth 3 times per day as needed Patient taking differently: Take 25 mg by mouth 3 (three) times daily as needed for itching. 10/13/21     naproxen (NAPROSYN) 375 MG tablet Take 1 tablet (375 mg total) by mouth 3 (three) times daily with meals. 05/09/22     triamcinolone ointment (KENALOG) 0.1 % apply a thin layer to the affected area(s) by topical route 2 times per day Patient not taking: Reported on 06/17/2022 09/17/21     Vitamin D, Ergocalciferol, (DRISDOL) 1.25 MG (50000 UNIT) CAPS capsule Take 1 capsule (50,000 Units total) by mouth every 7 (seven) days. 05/04/22   Daryel November, MD    Current Outpatient Medications  Medication Sig Dispense Refill   albuterol (VENTOLIN HFA) 108 (90 Base) MCG/ACT inhaler Inhale 2 puffs into the lungs every 6 (six) hours as needed for shortness of breath 6.7 g 2   furosemide (LASIX) 40 MG tablet Take 1 tablet (40 mg total) by mouth 2 (two) times daily. 30 tablet 2   ondansetron (ZOFRAN-ODT) 8 MG disintegrating tablet Take 1 tablet (8 mg total) by mouth 3 (  three) times daily as needed for nausea. 90 tablet 0   spironolactone (ALDACTONE) 100 MG tablet Take 1 tablet (100 mg total) by mouth 2 (two) times daily. 30 tablet 2   aspirin EC 81 MG tablet Take 81 mg by mouth daily. Swallow whole.     cyclobenzaprine (FLEXERIL) 10 MG tablet Take 1 Tablet by mouth nightly at bedtime as needed for muscle spasms 30 tablet 5   hydrOXYzine (ATARAX) 25 MG tablet Take 1 tablet by mouth 3 times per day as needed (Patient taking differently: Take 25 mg by mouth 3 (three) times daily as needed for itching.) 90 tablet 5   naproxen (NAPROSYN) 375 MG tablet Take 1 tablet (375 mg total) by mouth 3 (three) times daily with meals. 100 tablet 1    triamcinolone ointment (KENALOG) 0.1 % apply a thin layer to the affected area(s) by topical route 2 times per day (Patient not taking: Reported on 06/17/2022) 454 g 5   Vitamin D, Ergocalciferol, (DRISDOL) 1.25 MG (50000 UNIT) CAPS capsule Take 1 capsule (50,000 Units total) by mouth every 7 (seven) days. 16 capsule 0   Current Facility-Administered Medications  Medication Dose Route Frequency Provider Last Rate Last Admin   0.9 %  sodium chloride infusion  500 mL Intravenous Once Daryel November, MD        Allergies as of 06/17/2022 - Review Complete 06/17/2022  Allergen Reaction Noted   Paxil [paroxetine] Other (See Comments)    Tylenol [acetaminophen] Other (See Comments)    Ultram [tramadol] Nausea Only 03/30/2022    Family History  Problem Relation Age of Onset   Varicose Veins Mother    Heart disease Mother    Diabetes Mother    Arthritis Mother    Hypertension Mother    Stroke Father    Heart disease Father    Colon cancer Neg Hx    Stomach cancer Neg Hx    Rectal cancer Neg Hx    Esophageal cancer Neg Hx     Social History   Socioeconomic History   Marital status: Single    Spouse name: Not on file   Number of children: Not on file   Years of education: Not on file   Highest education level: Not on file  Occupational History   Not on file  Tobacco Use   Smoking status: Every Day    Packs/day: 0.15    Types: Cigarettes   Smokeless tobacco: Never  Vaping Use   Vaping Use: Never used  Substance and Sexual Activity   Alcohol use: No   Drug use: No   Sexual activity: Not on file  Other Topics Concern   Not on file  Social History Narrative   Not on file   Social Determinants of Health   Financial Resource Strain: Not on file  Food Insecurity: Not on file  Transportation Needs: Not on file  Physical Activity: Not on file  Stress: Not on file  Social Connections: Not on file  Intimate Partner Violence: Not on file    Review of Systems:  All  other review of systems negative except as mentioned in the HPI.  Physical Exam: Vital signs BP 136/60   Pulse (!) 102   Temp 98 F (36.7 C) (Temporal)   Ht '6\' 2"'$  (1.88 m)   Wt (!) 312 lb (141.5 kg)   SpO2 100%   BMI 40.06 kg/m   General:   Alert,  Well-developed, well-nourished, pleasant and cooperative in NAD Airway:  Mallampati 3 Lungs:  Clear throughout to auscultation.   Heart:  Regular rate and rhythm; no murmurs, clicks, rubs,  or gallops. Abdomen:  Soft, nontender and nondistended. Normal bowel sounds.   Neuro/Psych:  Normal mood and affect. A and O x 3   Oma Marzan [name], MD [facility]

## 2022-06-17 NOTE — Progress Notes (Signed)
Report to pacu rn. Vss. Care resumed by rn. 

## 2022-06-17 NOTE — Progress Notes (Signed)
Called to room to assist during endoscopic procedure.  Patient ID and intended procedure confirmed with present staff. Received instructions for my participation in the procedure from the performing physician.  

## 2022-06-17 NOTE — Op Note (Signed)
Erik Howe Procedure Date: 06/17/2022 1:09 PM MRN: [id number] Endoscopist: Nicki Reaper [name] , MD, [id number] Age: 51 Referring MD:  Date of Birth: 1970/12/02 Gender: Male Account #: [id number] Procedure:                Upper GI endoscopy Indications:              Cirrhosis rule out esophageal varices Medicines:                Monitored Anesthesia Care Procedure:                Pre-Anesthesia Assessment:                           - Prior to the procedure, a History and Physical                            was performed, and patient medications and                            allergies were reviewed. The patient's tolerance of                            previous anesthesia was also reviewed. The risks                            and benefits of the procedure and the sedation                            options and risks were discussed with the patient.                            All questions were answered, and informed consent                            was obtained. Prior Anticoagulants: The patient has                            taken no anticoagulant or antiplatelet agents. ASA                            Grade Assessment: III - A patient with severe                            systemic disease. After reviewing the risks and                            benefits, the patient was deemed in satisfactory                            condition to undergo the procedure.                           After obtaining informed consent, the endoscope was  passed under direct vision. Throughout the                            procedure, the patient's blood pressure, pulse, and                            oxygen saturations were monitored continuously. The                            GIF HQ190 #[id number] was introduced through the                            mouth, and advanced to the second part of duodenum.                            The upper GI  endoscopy was accomplished without                            difficulty. The patient tolerated the procedure                            well. Scope In: Scope Out: Findings:                 The examined portions of the nasopharynx,                            oropharynx and larynx were normal.                           LA Grade D (one or more mucosal breaks involving at                            least 75% of esophageal circumference) esophagitis                            with no bleeding was found.                           The exam of the esophagus was otherwise normal.                           Moderate portal hypertensive gastropathy was found                            in the gastric body. Biopsies were taken with a                            cold forceps for Helicobacter pylori testing.                            Estimated blood loss was minimal.                           A 6 cm hiatal hernia was present.  The exam of the stomach was otherwise normal.                           Severe ulceration of the second portion of the                            duodenum without bleeding or bleeding stigmata. The                            ulceration involved over two thirds of the                            circumference of the duodenum and caused some                            luminal narrowing. Biopsies were taken with a cold                            forceps for histology. Estimated blood loss was                            minimal.                           Diffuse severely erythematous mucosa without active                            bleeding with numerous small erosions/ulcersand                            with no stigmata of bleeding was found in the                            entire duodenum. Complications:            No immediate complications. Estimated Blood Loss:     Estimated blood loss was minimal. Impression:               - The examined portions of  the nasopharynx,                            oropharynx and larynx were normal.                           - LA Grade D reflux esophagitis with no bleeding.                           - Portal hypertensive gastropathy. Biopsied.                           - 6 cm hiatal hernia.                           - Partially obstructing non-bleeding severe  duodenal ulceration. Biopsied.                           - Erythematous duodenopathy. Recommendation:           - Patient has a contact number available for                            emergencies. The signs and symptoms of potential                            delayed complications were discussed with the                            patient. Return to normal activities tomorrow.                            Written discharge instructions were provided to the                            patient.                           - Resume previous diet.                           - Use Prilosec (omeprazole) 20 mg PO BID for 8                            weeks.                           - Use sucralfate tablets 1 gram PO QID for 2 weeks.                           - Repeat upper endoscopy in 8 weeks to check                            healing. Will plan to do this in the hospital                            setting at time of repeat colonoscopy.                           - Avoid all NSAIDs                           - Await pathology results. Erik Howe [name], MD 06/17/2022 2:58:53 PM This report has been signed electronically.

## 2022-06-22 ENCOUNTER — Telehealth: Payer: Self-pay | Admitting: Gastroenterology

## 2022-06-22 ENCOUNTER — Telehealth: Payer: Self-pay

## 2022-06-22 NOTE — Telephone Encounter (Signed)
Patient called stating he needed to have a paracentesis set up.  Please call patient and advise.  Thank you.

## 2022-06-22 NOTE — Telephone Encounter (Signed)
Left message on answering machine. 

## 2022-06-22 NOTE — Telephone Encounter (Signed)
Pt scheduled for IR para at [facility] 06/24/22'@10am'$ , pt to arrive there at 9:30am. Pt aware of appt.

## 2022-06-23 ENCOUNTER — Other Ambulatory Visit: Payer: Self-pay

## 2022-06-23 ENCOUNTER — Telehealth: Payer: Self-pay

## 2022-06-23 DIAGNOSIS — R197 Diarrhea, unspecified: Secondary | ICD-10-CM

## 2022-06-23 DIAGNOSIS — R195 Other fecal abnormalities: Secondary | ICD-10-CM

## 2022-06-23 DIAGNOSIS — K269 Duodenal ulcer, unspecified as acute or chronic, without hemorrhage or perforation: Secondary | ICD-10-CM

## 2022-06-23 DIAGNOSIS — K7469 Other cirrhosis of liver: Secondary | ICD-10-CM

## 2022-06-23 NOTE — Telephone Encounter (Signed)
Pt scheduled for IR para at [facility] 07/26/22 at [place], pt to arrive there at 8:30am. ECL scheduled at [facility] 07/28/22 at 8:30am. Pt to arrive at [facility] 7:00am. Amb ref in epic.

## 2022-06-24 ENCOUNTER — Other Ambulatory Visit (HOSPITAL_COMMUNITY): Admission: RE | Admit: 2022-06-24 | Payer: Medicaid Other | Source: Ambulatory Visit

## 2022-06-24 ENCOUNTER — Other Ambulatory Visit: Payer: Self-pay

## 2022-06-24 ENCOUNTER — Ambulatory Visit (HOSPITAL_COMMUNITY)
Admission: RE | Admit: 2022-06-24 | Discharge: 2022-06-24 | Disposition: A | Discharge status: Home / Self Care | Payer: Medicaid Other | Source: Ambulatory Visit | Attending: Gastroenterology | Admitting: Gastroenterology

## 2022-06-24 DIAGNOSIS — R188 Other ascites: Secondary | ICD-10-CM | POA: Insufficient documentation

## 2022-06-24 DIAGNOSIS — K7469 Other cirrhosis of liver: Secondary | ICD-10-CM | POA: Diagnosis present

## 2022-06-24 HISTORY — PX: IR PARACENTESIS: IMG2679

## 2022-06-24 LAB — BODY FLUID CELL COUNT WITH DIFFERENTIAL
Lymphs, Fluid: 82 %
Monocyte-Macrophage-Serous Fluid: 11 % — ABNORMAL LOW (ref 50–90)
Neutrophil Count, Fluid: 7 % (ref 0–25)
Total Nucleated Cell Count, Fluid: 535 cu mm (ref 0–1000)

## 2022-06-24 MED ORDER — ALBUMIN HUMAN 25 % IV SOLN
50.0000 g | Freq: Once | INTRAVENOUS | Status: DC
  Filled 2022-06-24 (×3): qty 200

## 2022-06-24 MED ORDER — ALBUTEROL SULFATE HFA 108 (90 BASE) MCG/ACT IN AERS
2.0000 | INHALATION_SPRAY | Freq: Four times a day (QID) | RESPIRATORY_TRACT | 2 refills | Status: DC | PRN
  Filled 2022-06-24: qty 18, 25d supply, fill #0
  Filled 2022-08-18: qty 18, 25d supply, fill #1
  Filled 2023-03-08: qty 18, 25d supply, fill #2

## 2022-06-24 MED ORDER — LIDOCAINE HCL 1 % IJ SOLN
INTRAMUSCULAR | Status: AC
  Administered 2022-06-24: 7 mL
  Filled 2022-06-24: qty 20

## 2022-06-24 MED ORDER — ALBUMIN HUMAN 25 % IV SOLN
INTRAVENOUS | Status: AC
  Filled 2022-06-24: qty 200

## 2022-06-24 NOTE — Procedures (Signed)
PROCEDURE SUMMARY:  Successful image-guided paracentesis from the left lower abdomen.  Yielded 6.4 liters of cloudy yellow fluid.  No immediate complications.  EBL = trace. Patient tolerated well.   Specimen was sent for labs.  Please see imaging section of Epic for full dictation.   Lura Em PA-C 06/24/2022 10:23 AM

## 2022-06-27 ENCOUNTER — Other Ambulatory Visit: Payer: Self-pay

## 2022-06-27 DIAGNOSIS — R195 Other fecal abnormalities: Secondary | ICD-10-CM

## 2022-06-27 DIAGNOSIS — K7460 Unspecified cirrhosis of liver: Secondary | ICD-10-CM

## 2022-06-27 DIAGNOSIS — K269 Duodenal ulcer, unspecified as acute or chronic, without hemorrhage or perforation: Secondary | ICD-10-CM

## 2022-06-27 MED ORDER — NA SULFATE-K SULFATE-MG SULF 17.5-3.13-1.6 GM/177ML PO SOLN
ORAL | 0 refills | Status: DC
  Filled 2022-06-27: qty 354, 2d supply, fill #0

## 2022-06-27 NOTE — Telephone Encounter (Signed)
Spoke with pt and he is aware of appts and would like prep instructions sent via mychart. Instructions sent as requested.   Pt states IR mentioned to him that if GI entered a referral for Eval for Tips he might be able to have the tips procedure. Please advise.

## 2022-06-27 NOTE — Telephone Encounter (Signed)
Ordered ir eval and treat for tips eval.

## 2022-06-28 LAB — CYTOLOGY - NON PAP

## 2022-06-29 ENCOUNTER — Telehealth: Payer: Self-pay

## 2022-06-29 ENCOUNTER — Other Ambulatory Visit: Payer: Self-pay

## 2022-06-29 DIAGNOSIS — K7460 Unspecified cirrhosis of liver: Secondary | ICD-10-CM

## 2022-06-29 NOTE — Telephone Encounter (Signed)
Pt calling requesting to have another paracentesis. His last one was 06/24/22. Please advise.

## 2022-06-29 NOTE — Telephone Encounter (Signed)
Spoke with Dr. Candis Schatz and pt ok for another IR para. Pt scheduled for IR Para at [facility] 06/30/22 at 10am. Pt to go in through entrance C and arrive at 9:30am. Pt aware of appt.

## 2022-06-30 ENCOUNTER — Ambulatory Visit (HOSPITAL_COMMUNITY)
Admission: RE | Admit: 2022-06-30 | Discharge: 2022-06-30 | Disposition: A | Discharge status: Home / Self Care | Payer: Medicaid Other | Source: Ambulatory Visit | Attending: Gastroenterology | Admitting: Gastroenterology

## 2022-06-30 DIAGNOSIS — K7460 Unspecified cirrhosis of liver: Secondary | ICD-10-CM | POA: Insufficient documentation

## 2022-06-30 DIAGNOSIS — R188 Other ascites: Secondary | ICD-10-CM | POA: Diagnosis not present

## 2022-06-30 HISTORY — PX: IR PARACENTESIS: IMG2679

## 2022-06-30 LAB — BODY FLUID CELL COUNT WITH DIFFERENTIAL
Lymphs, Fluid: 85 %
Monocyte-Macrophage-Serous Fluid: 9 % — ABNORMAL LOW (ref 50–90)
Neutrophil Count, Fluid: 6 % (ref 0–25)
Total Nucleated Cell Count, Fluid: 579 cu mm (ref 0–1000)

## 2022-06-30 MED ORDER — LIDOCAINE HCL 1 % IJ SOLN
INTRAMUSCULAR | Status: AC
  Administered 2022-06-30: 20 mL
  Filled 2022-06-30: qty 20

## 2022-06-30 MED ORDER — ALBUMIN HUMAN 25 % IV SOLN
25.0000 g | Freq: Once | INTRAVENOUS | Status: AC
  Administered 2022-06-30: 25 g via INTRAVENOUS

## 2022-06-30 MED ORDER — ALBUMIN HUMAN 25 % IV SOLN: 75.0000 g | Freq: Once | INTRAVENOUS | Status: DC

## 2022-06-30 MED ORDER — ALBUMIN HUMAN 25 % IV SOLN
INTRAVENOUS | Status: AC
  Filled 2022-06-30: qty 300

## 2022-06-30 MED ORDER — ALBUMIN HUMAN 25 % IV SOLN
50.0000 g | Freq: Once | INTRAVENOUS | Status: AC
  Administered 2022-06-30: 50 g via INTRAVENOUS

## 2022-06-30 NOTE — Procedures (Signed)
PROCEDURE SUMMARY:  Successful ultrasound guided paracentesis from the right lower quadrant.  Yielded 10.2L of milky ascitic fluid.  No immediate complications.  The patient tolerated the procedure well.   Specimen was sent for labs.  EBL < 22m  The patient has required >/=2 paracenteses in a 30 day period and [facility] has been arranged.   Electronically Signed: HPasty Spillers PA-C 06/30/2022, 4:37 PM

## 2022-07-03 NOTE — Progress Notes (Signed)
Erik Howe,  The biopsies of your duodenum showed inflammatory changes and ulceration, but no evidence of malignancy.  The biopsies of your stomach also showed inflammation, but no evidence of H. Pylori infection.  The ulcers and inflammation were most likely caused by pain medications (NSAIDs).  Please avoid all pain medications except for Tylenol (acetaminophen).  Because of your cirrhosis, you should not take more than 3 grams of acetaminophen per day.  Please take the omeprazole twice daily and sucralffate as instructed.  This reduces stomach acid production and coats the inflamed areas, allows the ulcers and esophagitis to heal.   All of the polyps that I removed during your recent procedure were completely benign but were proven to be "pre-cancerous" polyps that MAY have grown into cancers if they had not been removed.  Studies shows that at least 20% of women over age 53 and 30% of men over age 63 have pre-cancerous polyps.  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 As discussed, because your colonoscopy was incomplete (unable to completely visualize the cecum), we will need to repeat your colonoscopy within the year.    As we also need to repeat an upper endoscopy to assess healing of your duodenal ulcers after 8 weeks, we can repeat your colonoscopy at the same time.   Because of your comorbidities and difficulty with sedation during your procedures, we will plan to your next EGD and colonoscopy in the hospital setting.  If you develop any new rectal bleeding, abdominal pain or significant bowel habit changes, please contact me before then.

## 2022-07-04 LAB — PATHOLOGIST SMEAR REVIEW

## 2022-07-05 NOTE — Telephone Encounter (Signed)
Patient called inquiring about his prep instruction for the upcoming hospital procedure 07/28/22 also said he needs a paracentesis.

## 2022-07-05 NOTE — Telephone Encounter (Signed)
Discussed with pt that instructions were sent via mychart, they have also been mailed to him.  Pt has appt tomorrow with IR regarding eval for tips. He is calling requesting another para towards the end of the week/early next week. Please advise.

## 2022-07-06 ENCOUNTER — Other Ambulatory Visit: Payer: Self-pay | Admitting: *Deleted

## 2022-07-06 ENCOUNTER — Ambulatory Visit
Admission: RE | Admit: 2022-07-06 | Discharge: 2022-07-06 | Disposition: A | Discharge status: Home / Self Care | Payer: Medicaid Other | Source: Ambulatory Visit | Attending: Gastroenterology | Admitting: Gastroenterology

## 2022-07-06 ENCOUNTER — Other Ambulatory Visit: Payer: Self-pay | Admitting: Diagnostic Radiology

## 2022-07-06 ENCOUNTER — Other Ambulatory Visit: Payer: Self-pay

## 2022-07-06 DIAGNOSIS — Z01812 Encounter for preprocedural laboratory examination: Secondary | ICD-10-CM

## 2022-07-06 DIAGNOSIS — K7460 Unspecified cirrhosis of liver: Secondary | ICD-10-CM

## 2022-07-06 DIAGNOSIS — R188 Other ascites: Secondary | ICD-10-CM

## 2022-07-06 NOTE — Consult Note (Signed)
Chief Complaint: Patient was seen in consultation today for TIPS procedure  Referring Physician(s): Cunningham,Scott E  History of Present Illness: Erik Howe is a 52 y.o. male undergoing evaluation in the [facility] due to recurrent ascites.    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 overload # of paracentesis in last month: 3 # of paracentesis in last 2 months: 6 History of hepatic hydrothorax:  No History of hepatic encephalopathy: Maybe   Prior evaluation for liver transplant: No History of hepatocellular carcinoma: No   Prior esophagogastroduodenoscopy/intervention: Yes Current esophageal varices: Yes, small on CT Current gastric varices: No History of hematemesis: No   Current diuretic regimen: furosemide 80 mg am and 40 mg pm, spironolactone 100 mg BID Current pharmacologic encephalopathy prophylaxis/treatment: none   History of renal dysfunction: No History of hemodialysis: No   History of cardiac dysfunction: No   Other pertinent past medical history: depression, gastroesophageal reflux disease, hypertension, obesity  Patient presents for TIPS evaluation.  Patient is frustrated with recurrent large-volume ascites requiring paracentesis approximately 3 times every month.  Patient had a colonoscopy and endoscopy on 06/17/2022.  Endoscopy was significant for reflux esophagitis, portal hypertensive gastropathy and partially obstructing nonbleeding severe duodenal ulcerations.  Biopsies were negative for malignancy.  Colonoscopy was significant for multiple polyps.  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  Patient has been considered for repeat colonoscopy within the year.  Patient is also being considered for repeat upper endoscopy to assess healing duodenal ulcers.  Patient complains of severe shortness of  breath and unable to walk any significant distances without respiratory difficulty.  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  He complains of mental changes with difficulty remembering things.  Patient lives by himself and does not have any family or support system locally.  Patient has not been evaluated for liver transplant.  Past Medical History:  Diagnosis Date   Arthritis    Cellulitis 12/18/2012   left foot   Cirrhosis of liver (HCC)    Depression    GERD (gastroesophageal reflux disease)    Hypertension    Obesity    Varicose veins     Past Surgical History:  Procedure Laterality Date   CHOLECYSTECTOMY     ENDOVENOUS ABLATION SAPHENOUS VEIN W/ LASER Left 09-30-2013   left greater saphenous vein   IR PARACENTESIS  05/03/2022   IR PARACENTESIS  05/18/2022   IR PARACENTESIS  06/02/2022   IR PARACENTESIS  06/10/2022   IR PARACENTESIS  06/24/2022   IR PARACENTESIS  06/30/2022    Allergies: Paxil [paroxetine], Tylenol [acetaminophen], and Ultram [tramadol]  Medications: Prior to Admission medications   Medication Sig Start Date End Date Taking? Authorizing Provider  albuterol (VENTOLIN HFA) 108 (90 Base) MCG/ACT inhaler Inhale 2 puffs into the lungs every 6 (six) hours as needed for shortness of breath 04/11/22     albuterol (VENTOLIN HFA) 108 (90 Base) MCG/ACT inhaler Inhale 2 puffs into the lungs every 6 (six) hours as neededshortness of breath . 04/11/22   Drue Flirt, MD  cyclobenzaprine (FLEXERIL) 10 MG tablet Take 1 Tablet by mouth nightly at bedtime as needed for muscle spasms 12/15/21     furosemide (LASIX) 80 MG tablet Take 1 tablet (80 mg total) by mouth in the morning AND 0.5 tablets (40 mg total) every evening. 06/17/22   Candis Schatz,  Gladstone Pih, MD  hydrOXYzine (ATARAX) 25 MG tablet Take 1 tablet by mouth 3 times per day as needed Patient taking differently: Take 25 mg by mouth 3 (three) times daily as needed for itching.  10/13/21     Na Sulfate-K Sulfate-Mg Sulf 17.5-3.13-1.6 GM/177ML SOLN Take as directed 06/27/22   Daryel November, MD  naproxen (NAPROSYN) 375 MG tablet Take 1 tablet (375 mg total) by mouth 3 (three) times daily with meals. 05/09/22     omeprazole (PRILOSEC) 20 MG capsule Take 1 capsule (20 mg total) by mouth 2 (two) times daily for 8 weeks. 06/17/22   Daryel November, MD  ondansetron (ZOFRAN-ODT) 8 MG disintegrating tablet Take 1 tablet (8 mg total) by mouth 3 (three) times daily as needed for nausea. 06/07/22     spironolactone (ALDACTONE) 100 MG tablet Take 1 tablet (100 mg total) by mouth 2 (two) times daily. 05/26/22   Daryel November, MD  sucralfate (CARAFATE) 1 g tablet Take 1 tablet (1 g total) by mouth 4 (four) times daily for 2 weeks. 06/17/22   Daryel November, MD  triamcinolone ointment (KENALOG) 0.1 % apply a thin layer to the affected area(s) by topical route 2 times per day Patient not taking: Reported on 06/17/2022 09/17/21     Vitamin D, Ergocalciferol, (DRISDOL) 1.25 MG (50000 UNIT) CAPS capsule Take 1 capsule (50,000 Units total) by mouth every 7 (seven) days. 05/04/22   Daryel November, MD     Family History  Problem Relation Age of Onset   Varicose Veins Mother    Heart disease Mother    Diabetes Mother    Arthritis Mother    Hypertension Mother    Stroke Father    Heart disease Father    Colon cancer Neg Hx    Stomach cancer Neg Hx    Rectal cancer Neg Hx    Esophageal cancer Neg Hx     Social History   Socioeconomic History   Marital status: Single    Spouse name: Not on file   Number of children: Not on file   Years of education: Not on file   Highest education level: Not on file  Occupational History   Not on file  Tobacco Use   Smoking status: Every Day    Packs/day: 0.15    Types: Cigarettes   Smokeless tobacco: Never  Vaping Use   Vaping Use: Never used  Substance and Sexual Activity   Alcohol use: No   Drug use: No   Sexual  activity: Not on file  Other Topics Concern   Not on file  Social History Narrative   Not on file   Social Determinants of Health   Financial Resource Strain: Not on file  Food Insecurity: Not on file  Transportation Needs: Not on file  Physical Activity: Not on file  Stress: Not on file  Social Connections: Not on file    Review of Systems  Constitutional: Negative.   Respiratory:  Positive for shortness of breath.   Cardiovascular:  Positive for leg swelling.  Gastrointestinal:  Positive for abdominal distention.    Vital Signs: BP 133/74 (Patient Position: Sitting, Cuff Size: Normal)   Pulse 91   Temp 98.1 F (36.7 C) (Oral)   Wt 130.2 kg   SpO2 100% Comment: room air  BMI 36.85 kg/m     Physical Exam Constitutional:      Appearance: He is obese.  Cardiovascular:     Rate and Rhythm:  Normal rate and regular rhythm.  Pulmonary:     Effort: Pulmonary effort is normal.     Breath sounds: Wheezing present.     Comments: Severe wheezes in the left lung. Abdominal:     General: There is distension.  Musculoskeletal:     Right lower leg: Edema present.     Left lower leg: Edema present.     Comments: Swelling in both lower legs and ankles.  Severe dry skin in the anterior lower legs bilaterally.  Neurological:     Mental Status: He is alert.        Imaging: IR Paracentesis  Result Date: 06/30/2022 INDICATION: Recurrent ascites, history of cirrhosis EXAM: ULTRASOUND GUIDED THERAPEUTIC PARACENTESIS MEDICATIONS: None. COMPLICATIONS: None immediate. PROCEDURE: Informed written consent was obtained from the patient after a discussion of the risks, benefits and alternatives to treatment. A timeout was performed prior to the initiation of the procedure. Initial ultrasound scanning demonstrates a large amount of ascites within the right lower abdominal quadrant. The right lower abdomen was prepped and draped in the usual sterile fashion. 1% lidocaine was used for local  anesthesia. Following this, a 19 gauge, 7-cm, Yueh catheter was introduced. An ultrasound image was saved for documentation purposes. The paracentesis was performed. The catheter was removed and a dressing was applied. The patient tolerated the procedure well without immediate post procedural complication. Patient received post-procedure intravenous albumin; see nursing notes for details. FINDINGS: A total of approximately 10.2 L of milky colored ascitic fluid was removed. Samples were sent to the laboratory as requested by the clinical team. IMPRESSION: Successful ultrasound-guided paracentesis yielding 10.2 L of peritoneal fluid. RIGHT and performed by Pasty Spillers IR PA PLAN: The patient has required >/=2 paracenteses in a 30 day period and a formal evaluation by the [facility] has been arranged. Electronically Signed   By: Michaelle Birks M.D.   On: 06/30/2022 17:50   IR Paracentesis  Result Date: 06/24/2022 INDICATION: Patient with a history of cirrhosis with recurrent large volume ascites. Interventional radiology asked to perform a diagnostic and therapeutic paracentesis. EXAM: ULTRASOUND GUIDED left lower quadrant PARACENTESIS MEDICATIONS: 6 cc 1% lidocaine COMPLICATIONS: None immediate. PROCEDURE: Informed written consent was obtained from the patient after a discussion of the risks, benefits and alternatives to treatment. A timeout was performed prior to the initiation of the procedure. Initial ultrasound scanning demonstrates a large amount of ascites within the left lower abdominal quadrant. The left lower abdomen was prepped and draped in the usual sterile fashion. 1% lidocaine was used for local anesthesia. Following this, a 19 gauge, 7-cm, Yueh catheter was introduced. An ultrasound image was saved for documentation purposes. The paracentesis was performed. The catheter was removed and a dressing was applied. The patient tolerated the procedure  well without immediate post procedural complication. Patient received post-procedure intravenous albumin; see nursing notes for details. FINDINGS: A total of approximately 6.4 L of cloudy yellow fluid was removed. Samples were sent to the laboratory as requested by the clinical team. IMPRESSION: Successful ultrasound-guided paracentesis yielding 6.4 L liters of peritoneal fluid. PLAN: The patient has required >/=2 paracenteses in a 30 day period and a formal evaluation by the [facility] has been arranged per procedure dictation from 06/10/2022. Read by: Reatha Armour, PA-C Electronically Signed   By: Ruthann Cancer M.D.   On: 06/24/2022 15:55   IR Paracentesis  Result Date: 06/10/2022 INDICATION: Patient with a history of cirrhosis with recurrent large  volume ascites. Interventional radiology asked to perform a diagnostic and therapeutic paracentesis. EXAM: ULTRASOUND GUIDED PARACENTESIS MEDICATIONS: 1% lidocaine 10 mL COMPLICATIONS: None immediate. PROCEDURE: Informed written consent was obtained from the patient after a discussion of the risks, benefits and alternatives to treatment. A timeout was performed prior to the initiation of the procedure. Initial ultrasound scanning demonstrates a large amount of ascites within the right lower abdominal quadrant. The right lower abdomen was prepped and draped in the usual sterile fashion. 1% lidocaine was used for local anesthesia. Following this, a 19 gauge, 7-cm, Yueh catheter was introduced. An ultrasound image was saved for documentation purposes. The paracentesis was performed. The catheter was removed and a dressing was applied. The patient tolerated the procedure well without immediate post procedural complication. Patient received post-procedure intravenous albumin; see nursing notes for details. FINDINGS: A total of approximately 6 L of cloudy, blood-tinged fluid was removed. Samples were sent to the  laboratory as requested by the clinical team. IMPRESSION: Successful ultrasound-guided paracentesis yielding 6 liters of peritoneal fluid. Read by: Soyla Dryer, NP PLAN: The patient has required >/=2 paracenteses in a 30 day period and a formal evaluation by the [facility] has been arranged. Dr. Serafina Royals has evaluated this patient for TIPS and a note has been sent to the patient's GI team. Electronically Signed   By: Aletta Edouard M.D.   On: 06/10/2022 13:27    Labs:  CBC: Recent Labs    03/10/22 1848 03/30/22 1031 04/12/22 1805  WBC 9.2 7.2 7.4  HGB 13.9 13.6 12.7*  HCT 40.6 40.8 37.7*  PLT 206 144* 199    COAGS: Recent Labs    03/11/22 0246 04/12/22 1805  INR 1.6* 1.5*  APTT  --  37*    BMP: Recent Labs    03/10/22 1848 03/30/22 1031 04/12/22 1805 05/02/22 1441 05/20/22 1057  NA 134* 135 132* 135 130*  K 3.5 3.4* 4.2 3.8 4.7  CL 104 105 102 101 97  CO2 21* '26 25 28 27  '$ GLUCOSE 97 93 102* 91 116*  BUN '6 8 11 7 11  '$ CALCIUM 8.0* 8.2* 7.9* 8.5 8.0*  CREATININE 0.94 0.78 0.87 0.78 1.00  GFRNONAA >60 >60 >60  --   --     LIVER FUNCTION TESTS: Recent Labs    03/10/22 1848 03/30/22 1031 04/12/22 1805 05/02/22 1441  BILITOT 2.5* 2.1* 2.0* 1.5*  AST 69* 61* 61* 41*  ALT 34 32 32 26  ALKPHOS 205* 217* 190* 169*  PROT 6.3* 6.6 6.0* 6.8  ALBUMIN 2.7* 2.6* 2.5* 2.9*    TUMOR MARKERS: Recent Labs    05/02/22 1441  AFPTM 4.9   MELD = 19  Assessment and Plan:  52 year old with cirrhosis and refractory ascites.  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  Patient is requiring large-volume paracentesis approximately 3 times a month.  Small esophageal varices on CT but no hematemesis or significant varices identified with endoscopy.  Discussed TIPS procedure in depth.  We went over the anatomy and physiology of his cirrhosis and portal hypertension and explained how TIPS procedure could potentially decrease or  resolve his ascites.  We discussed the possible complications of this procedure including hepatic encephalopathy, worsening liver function and even liver failure.   am concerned about the patient's most recent labs and he also tells me that he has increasing mental confusion which could be related to hepatic encephalopathy.  Calculated MELD score is approximately 19 but this includes multiple labs  from different time periods.  Patient will need updated labs to assess his liver function and assess his risk of complications and mortality following a TIPS procedure.  I believe the patient has very good understanding the procedure and he is very interested in pursuing the TIPS procedure if he is a candidate.  We will order complete metabolic panel, ammonia level, CBC, coags and he will need a echocardiogram to assess his heart.  After these tests, will see the patient again and talk about his candidacy for TIPS procedure.   Thank you for this interesting consult.  I greatly enjoyed meeting MACARTHUR LORUSSO and look forward to participating in their care.  A copy of this report was sent to the requesting provider on this date.  Electronically Signed: Burman Riis 07/06/2022, 12:34 PM   I spent a total of  45 minutes   in face to face in clinical consultation, greater than 50% of which was counseling/coordinating care for cirrhosis and refractory ascites.

## 2022-07-06 NOTE — Telephone Encounter (Signed)
Pt scheduled for IR para at [facility] 07/08/22 at 10am, he needs to arrive there at 9:30am. Pt aware of appt.  Pts endo/colon moved to [facility] 08/25/22 at 8:30am. Pt aware of new appt.date and time. New instructions sent to pt via mychart.  Pt also states he saw IR and he is borderline for TIP procedure. He has to have some labs done and an echocardiogram.

## 2022-07-08 ENCOUNTER — Other Ambulatory Visit: Payer: Self-pay

## 2022-07-08 ENCOUNTER — Ambulatory Visit (HOSPITAL_BASED_OUTPATIENT_CLINIC_OR_DEPARTMENT_OTHER): Payer: Medicaid Other

## 2022-07-08 ENCOUNTER — Ambulatory Visit (HOSPITAL_COMMUNITY)
Admission: RE | Admit: 2022-07-08 | Discharge: 2022-07-08 | Disposition: A | Discharge status: Home / Self Care | Payer: Medicaid Other | Source: Ambulatory Visit | Attending: Gastroenterology | Admitting: Gastroenterology

## 2022-07-08 DIAGNOSIS — Z01812 Encounter for preprocedural laboratory examination: Secondary | ICD-10-CM | POA: Diagnosis not present

## 2022-07-08 DIAGNOSIS — R188 Other ascites: Secondary | ICD-10-CM | POA: Diagnosis not present

## 2022-07-08 DIAGNOSIS — K7469 Other cirrhosis of liver: Secondary | ICD-10-CM | POA: Diagnosis not present

## 2022-07-08 DIAGNOSIS — Z01818 Encounter for other preprocedural examination: Secondary | ICD-10-CM | POA: Insufficient documentation

## 2022-07-08 HISTORY — PX: IR PARACENTESIS: IMG2679

## 2022-07-08 LAB — COMPLETE METABOLIC PANEL WITH GFR
AG Ratio: 1.1 (calc) (ref 1.0–2.5)
ALT: 18 U/L (ref 9–46)
AST: 31 U/L (ref 10–35)
Albumin: 3.2 g/dL — ABNORMAL LOW (ref 3.6–5.1)
Alkaline phosphatase (APISO): 98 U/L (ref 35–144)
BUN: 12 mg/dL (ref 7–25)
CO2: 25 mmol/L (ref 20–32)
Calcium: 8.5 mg/dL — ABNORMAL LOW (ref 8.6–10.3)
Chloride: 95 mmol/L — ABNORMAL LOW (ref 98–110)
Creat: 1.11 mg/dL (ref 0.70–1.30)
Globulin: 2.8 g/dL (calc) (ref 1.9–3.7)
Glucose, Bld: 88 mg/dL (ref 65–99)
Potassium: 4.2 mmol/L (ref 3.5–5.3)
Sodium: 129 mmol/L — ABNORMAL LOW (ref 135–146)
Total Bilirubin: 1.3 mg/dL — ABNORMAL HIGH (ref 0.2–1.2)
Total Protein: 6 g/dL — ABNORMAL LOW (ref 6.1–8.1)
eGFR: 80 mL/min/{1.73_m2} (ref >=60)

## 2022-07-08 LAB — BODY FLUID CELL COUNT WITH DIFFERENTIAL
Eos, Fluid: 1 %
Lymphs, Fluid: 89 %
Monocyte-Macrophage-Serous Fluid: 9 % — ABNORMAL LOW (ref 50–90)
Neutrophil Count, Fluid: 0 % (ref 0–25)
Total Nucleated Cell Count, Fluid: 365 cu mm (ref 0–1000)

## 2022-07-08 LAB — CBC
HCT: 33.3 % — ABNORMAL LOW (ref 38.5–50.0)
Hemoglobin: 11.9 g/dL — ABNORMAL LOW (ref 13.2–17.1)
MCH: 34.1 pg — ABNORMAL HIGH (ref 27.0–33.0)
MCHC: 35.7 g/dL (ref 32.0–36.0)
MCV: 95.4 fL (ref 80.0–100.0)
MPV: 9.8 fL (ref 7.5–12.5)
Platelets: 272 10*3/uL (ref 140–400)
RBC: 3.49 10*6/uL — ABNORMAL LOW (ref 4.20–5.80)
RDW: 12 % (ref 11.0–15.0)
WBC: 7.7 10*3/uL (ref 3.8–10.8)

## 2022-07-08 LAB — PROTIME-INR
INR: 1.2 — ABNORMAL HIGH
Prothrombin Time: 12.1 s — ABNORMAL HIGH (ref 9.0–11.5)

## 2022-07-08 LAB — AMMONIA: Ammonia: 43 umol/L (ref <=72)

## 2022-07-08 LAB — ECHOCARDIOGRAM COMPLETE
Area-P 1/2: 3.85 cm2
S' Lateral: 2.1 cm

## 2022-07-08 MED ORDER — ALBUMIN HUMAN 25 % IV SOLN
50.0000 g | Freq: Once | INTRAVENOUS | Status: AC
  Administered 2022-07-08: 50 g via INTRAVENOUS

## 2022-07-08 MED ORDER — PERFLUTREN LIPID MICROSPHERE
1.0000 mL | INTRAVENOUS | Status: AC | PRN
  Administered 2022-07-08: 3 mL via INTRAVENOUS

## 2022-07-08 MED ORDER — LIDOCAINE HCL 1 % IJ SOLN
INTRAMUSCULAR | Status: AC
  Administered 2022-07-08: 12 mL via INTRADERMAL
  Filled 2022-07-08: qty 20

## 2022-07-08 MED ORDER — ALBUMIN HUMAN 25 % IV SOLN
INTRAVENOUS | Status: AC
  Filled 2022-07-08: qty 300

## 2022-07-08 NOTE — Procedures (Addendum)
PROCEDURE SUMMARY:  Successful ultrasound guided paracentesis from the right lower quadrant.  Yielded 9.8 L of hazy yellow fluid.  No immediate complications.  The patient tolerated the procedure well.   Specimen sent for labs.  EBL < 2 mL  The patient has previously been formally evaluated by the [place] [facility] and is being actively followed for potential future intervention. ** Patient met with Dr. Anselm Pancoast 07/06/22 and will undergo further labs/diagnostic studies before moving forward with TIPS.   Soyla Dryer,  [phone] 07/08/2022, 12:18 PM

## 2022-07-12 LAB — CYTOLOGY - NON PAP

## 2022-07-13 ENCOUNTER — Other Ambulatory Visit: Payer: Self-pay

## 2022-07-13 ENCOUNTER — Telehealth (HOSPITAL_COMMUNITY): Payer: Self-pay | Admitting: Radiology

## 2022-07-13 MED ORDER — AMITRIPTYLINE HCL 10 MG PO TABS
ORAL_TABLET | ORAL | 1 refills | Status: DC
  Filled 2022-07-13: qty 30, 30d supply, fill #0
  Filled 2022-08-18: qty 30, 30d supply, fill #1

## 2022-07-13 NOTE — Telephone Encounter (Signed)
Called pt to see if he wanted to schedule his TIPS procedure on 1/31 at [facility] with Dr. Anselm Pancoast. He will speak with his sister and call me back tomorrow with an answer. JM

## 2022-07-14 ENCOUNTER — Encounter (HOSPITAL_COMMUNITY): Payer: Self-pay

## 2022-07-14 ENCOUNTER — Other Ambulatory Visit (HOSPITAL_COMMUNITY): Payer: Self-pay | Admitting: Diagnostic Radiology

## 2022-07-14 ENCOUNTER — Ambulatory Visit (INDEPENDENT_AMBULATORY_CARE_PROVIDER_SITE_OTHER): Payer: Medicaid Other | Admitting: Orthopaedic Surgery

## 2022-07-14 ENCOUNTER — Encounter: Payer: Self-pay | Admitting: Orthopaedic Surgery

## 2022-07-14 DIAGNOSIS — M1712 Unilateral primary osteoarthritis, left knee: Secondary | ICD-10-CM

## 2022-07-14 DIAGNOSIS — M7062 Trochanteric bursitis, left hip: Secondary | ICD-10-CM

## 2022-07-14 DIAGNOSIS — M1612 Unilateral primary osteoarthritis, left hip: Secondary | ICD-10-CM | POA: Diagnosis not present

## 2022-07-14 DIAGNOSIS — K7460 Unspecified cirrhosis of liver: Secondary | ICD-10-CM

## 2022-07-14 NOTE — Progress Notes (Signed)
Office Visit Note   Patient: Erik Howe           Date of Birth: [birth year]           MRN: [id number] Visit Date: 07/14/2022              Requested by: Jorge Ny, PA-C [id number] [place],  [place] [address] PCP: Jorge Ny, PA-C   Assessment & Plan: Visit Diagnoses:  1. Unilateral primary osteoarthritis, left hip   2. Unilateral primary osteoarthritis, left knee   3. Trochanteric bursitis, left hip     Plan: Impression is left knee osteoarthritis, left hip osteoarthritis and left hip trochanteric bursitis.  I believe majority the patient's symptoms are coming from his trochanteric bursa in addition to his left knee osteoarthritis.  We have discussed proceeding with cortisone injection to both areas in addition to an IT band exercise program.  Unfortunately, prior to injection we noticed the patient's skin is severely dry almost to the point of having what looks like psoriatic plaques.  We do not feel comfortable injecting through this as we do not want to introduce possible infection to the joint or bursa.  We have discussed making referral to dermatology for further evaluation and treatment recommendation.  Once he has this under control he may follow back up with [place] for injections.  He will call us with any concerns or questions in the meantime. Follow-Up Instructions: Return if symptoms worsen or fail to improve.   Orders:  Orders Placed This Encounter  Procedures   Ambulatory referral to Dermatology   No orders of the defined types were placed in this encounter.     Procedures: No procedures performed   Clinical Data: No additional findings.   Subjective: Chief Complaint  Patient presents with   Left Knee - Pain   Left Hip - Pain    HPI patient is a pleasant 52 year old gentleman who comes in today with left hip and left knee pain for the past several years.  He denies ever having an injury to the hip or knee.  The pain in his hip is to the lateral  aspect.  He denies any pain that radiates into the groin or buttock but does note occasional pain down the lateral leg.  Symptoms worsen when he is sitting too long as well as when he is lying on his left side.  He is unable to take NSAIDs or Tylenol due to cirrhosis of the liver as well as a recently GI ulcer.  No previous cortisone injection to the hip.  In regards to the left knee, he has pain throughout the entire aspect.  This is constant but worse when he is sitting or standing for longer than 5 to 10 minutes at a time.  He denies any paresthesias to the left lower extremity.  No history of lumbar pathology.  No previous cortisone injection to the left knee.  Review of Systems as detailed in HPI.  All others reviewed and are negative.   Objective: Vital Signs: There were no vitals taken for this visit.  Physical Exam well-developed well-nourished gentleman in no acute distress.  Alert and oriented x 3.  Ortho Exam left hip exam reveals marked tenderness to the greater trochanter.  No pain in the groin with logroll or FADIR.  Left knee exam shows a small effusion.  He has tenderness to the medial and lateral joint lines.  Ligaments are stable.  He is neurovascular intact distally.  Specialty  Comments:  No specialty comments available.  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   Severe obesity (BMI >= 40) ([place]) 03/28/2013   Depression 12/05/2010   TOBACCO USER 03/28/2009   Past Medical History:  Diagnosis Date   Arthritis    Cellulitis 12/18/2012   left foot   Cirrhosis of liver (HCC)    Depression    GERD (gastroesophageal reflux disease)    Hypertension    Obesity    Varicose veins     Family History  Problem Relation Age of Onset    Varicose Veins Mother    Heart disease Mother    Diabetes Mother    Arthritis Mother    Hypertension Mother    Stroke Father    Heart disease Father    Colon cancer Neg Hx    Stomach cancer Neg Hx    Rectal cancer Neg Hx    Esophageal cancer Neg Hx     Past Surgical History:  Procedure Laterality Date   CHOLECYSTECTOMY     ENDOVENOUS ABLATION SAPHENOUS VEIN W/ LASER Left 09-30-2013   left greater saphenous vein   IR PARACENTESIS  05/03/2022   IR PARACENTESIS  05/18/2022   IR PARACENTESIS  06/02/2022   IR PARACENTESIS  06/10/2022   IR PARACENTESIS  06/24/2022   IR PARACENTESIS  06/30/2022   IR PARACENTESIS  07/08/2022   Social History   Occupational History   Not on file  Tobacco Use   Smoking status: Every Day    Packs/day: 0.15    Types: Cigarettes   Smokeless tobacco: Never  Vaping Use   Vaping Use: Never used  Substance and Sexual Activity   Alcohol use: No   Drug use: No   Sexual activity: Not on file

## 2022-07-15 ENCOUNTER — Ambulatory Visit (HOSPITAL_COMMUNITY)
Admission: RE | Admit: 2022-07-15 | Discharge: 2022-07-15 | Disposition: A | Discharge status: Home / Self Care | Payer: Medicaid Other | Source: Ambulatory Visit | Attending: Gastroenterology | Admitting: Gastroenterology

## 2022-07-15 ENCOUNTER — Other Ambulatory Visit: Payer: Self-pay

## 2022-07-15 DIAGNOSIS — R188 Other ascites: Secondary | ICD-10-CM | POA: Insufficient documentation

## 2022-07-15 DIAGNOSIS — K7460 Unspecified cirrhosis of liver: Secondary | ICD-10-CM | POA: Diagnosis not present

## 2022-07-15 HISTORY — PX: IR PARACENTESIS: IMG2679

## 2022-07-15 LAB — BODY FLUID CELL COUNT WITH DIFFERENTIAL
Eos, Fluid: 0 %
Lymphs, Fluid: 82 %
Monocyte-Macrophage-Serous Fluid: 17 % — ABNORMAL LOW (ref 50–90)
Neutrophil Count, Fluid: 1 % (ref 0–25)
Total Nucleated Cell Count, Fluid: 562 cu mm (ref 0–1000)

## 2022-07-15 MED ORDER — ALBUMIN HUMAN 25 % IV SOLN
75.0000 g | Freq: Once | INTRAVENOUS | Status: AC
  Administered 2022-07-15: 75 g via INTRAVENOUS

## 2022-07-15 MED ORDER — ALBUMIN HUMAN 25 % IV SOLN
INTRAVENOUS | Status: AC
  Filled 2022-07-15: qty 200

## 2022-07-15 MED ORDER — LIDOCAINE HCL 1 % IJ SOLN
INTRAMUSCULAR | Status: AC
  Filled 2022-07-15: qty 20

## 2022-07-15 MED ORDER — ALBUMIN HUMAN 25 % IV SOLN
INTRAVENOUS | Status: AC
  Filled 2022-07-15: qty 100

## 2022-07-15 NOTE — Procedures (Signed)
PROCEDURE SUMMARY:  Successful ultrasound guided paracentesis from the left lower quadrant.  Yielded 9.8 liters of hazy yellow colored fluid.  No immediate complications.  The patient tolerated the procedure well.   Specimen was sent for labs.  EBL < 46m  The patient has previously been [facility] and is being actively followed for potential future intervention. Patient is scheduled for TIPS 1.31.24

## 2022-07-19 ENCOUNTER — Encounter (HOSPITAL_COMMUNITY): Payer: Self-pay | Admitting: Diagnostic Radiology

## 2022-07-19 ENCOUNTER — Other Ambulatory Visit: Payer: Self-pay

## 2022-07-19 ENCOUNTER — Other Ambulatory Visit: Payer: Self-pay | Admitting: Radiology

## 2022-07-19 DIAGNOSIS — K766 Portal hypertension: Secondary | ICD-10-CM

## 2022-07-19 LAB — PATHOLOGIST SMEAR REVIEW

## 2022-07-19 NOTE — Progress Notes (Signed)
Erik Howe denies chest pain or shortness of breath. Patient denies having any s/s of Covid in his household, also denies any known exposure to Covid.   Erik Howe's PCP is with [facility].

## 2022-07-20 ENCOUNTER — Other Ambulatory Visit (HOSPITAL_COMMUNITY): Payer: Self-pay | Admitting: Physician Assistant

## 2022-07-20 ENCOUNTER — Observation Stay (HOSPITAL_COMMUNITY)
Admission: RE | Admit: 2022-07-20 | Discharge: 2022-07-21 | Disposition: A | Discharge status: Home / Self Care | Payer: Medicaid Other | Attending: Diagnostic Radiology | Admitting: Diagnostic Radiology

## 2022-07-20 ENCOUNTER — Ambulatory Visit (HOSPITAL_COMMUNITY)
Admission: RE | Admit: 2022-07-20 | Discharge: 2022-07-20 | Disposition: A | Discharge status: Home / Self Care | Payer: Medicaid Other | Source: Ambulatory Visit | Attending: Diagnostic Radiology | Admitting: Diagnostic Radiology

## 2022-07-20 ENCOUNTER — Encounter (HOSPITAL_COMMUNITY)
Admission: RE | Disposition: A | Discharge status: Home / Self Care | Payer: Self-pay | Source: Home / Self Care | Attending: Diagnostic Radiology

## 2022-07-20 ENCOUNTER — Inpatient Hospital Stay (HOSPITAL_COMMUNITY): Payer: Medicaid Other | Admitting: Physician Assistant

## 2022-07-20 ENCOUNTER — Encounter (HOSPITAL_COMMUNITY): Payer: Self-pay | Admitting: Diagnostic Radiology

## 2022-07-20 DIAGNOSIS — F17210 Nicotine dependence, cigarettes, uncomplicated: Secondary | ICD-10-CM | POA: Insufficient documentation

## 2022-07-20 DIAGNOSIS — K766 Portal hypertension: Secondary | ICD-10-CM | POA: Diagnosis not present

## 2022-07-20 DIAGNOSIS — R188 Other ascites: Secondary | ICD-10-CM | POA: Diagnosis not present

## 2022-07-20 DIAGNOSIS — I10 Essential (primary) hypertension: Secondary | ICD-10-CM | POA: Insufficient documentation

## 2022-07-20 DIAGNOSIS — K7460 Unspecified cirrhosis of liver: Secondary | ICD-10-CM | POA: Insufficient documentation

## 2022-07-20 DIAGNOSIS — F32A Depression, unspecified: Secondary | ICD-10-CM | POA: Insufficient documentation

## 2022-07-20 DIAGNOSIS — E669 Obesity, unspecified: Secondary | ICD-10-CM | POA: Diagnosis not present

## 2022-07-20 DIAGNOSIS — F419 Anxiety disorder, unspecified: Secondary | ICD-10-CM | POA: Insufficient documentation

## 2022-07-20 HISTORY — PX: IR US GUIDE VASC ACCESS RIGHT: IMG2390

## 2022-07-20 HISTORY — PX: IR PARACENTESIS: IMG2679

## 2022-07-20 HISTORY — PX: RADIOLOGY WITH ANESTHESIA: SHX6223

## 2022-07-20 HISTORY — DX: Anxiety disorder, unspecified: F41.9

## 2022-07-20 HISTORY — PX: IR IVUS EACH ADDITIONAL NON CORONARY VESSEL: IMG6086

## 2022-07-20 HISTORY — PX: IR TIPS: IMG2295

## 2022-07-20 LAB — COMPREHENSIVE METABOLIC PANEL
ALT: 22 U/L (ref 0–44)
AST: 38 U/L (ref 15–41)
Albumin: 3 g/dL — ABNORMAL LOW (ref 3.5–5.0)
Alkaline Phosphatase: 74 U/L (ref 38–126)
Anion gap: 9 (ref 5–15)
BUN: 14 mg/dL (ref 6–20)
CO2: 21 mmol/L — ABNORMAL LOW (ref 22–32)
Calcium: 8.4 mg/dL — ABNORMAL LOW (ref 8.9–10.3)
Chloride: 100 mmol/L (ref 98–111)
Creatinine, Ser: 1.29 mg/dL — ABNORMAL HIGH (ref 0.61–1.24)
GFR, Estimated: >60 mL/min (ref >60)
Glucose, Bld: 71 mg/dL (ref 70–99)
Potassium: 4.6 mmol/L (ref 3.5–5.1)
Sodium: 130 mmol/L — ABNORMAL LOW (ref 135–145)
Total Bilirubin: 1.6 mg/dL — ABNORMAL HIGH (ref 0.3–1.2)
Total Protein: 5.9 g/dL — ABNORMAL LOW (ref 6.5–8.1)

## 2022-07-20 LAB — CBC
HCT: 34.3 % — ABNORMAL LOW (ref 39.0–52.0)
Hemoglobin: 11.3 g/dL — ABNORMAL LOW (ref 13.0–17.0)
MCH: 32.6 pg (ref 26.0–34.0)
MCHC: 32.9 g/dL (ref 30.0–36.0)
MCV: 98.8 fL (ref 80.0–100.0)
Platelets: 215 10*3/uL (ref 150–400)
RBC: 3.47 MIL/uL — ABNORMAL LOW (ref 4.22–5.81)
RDW: 13.2 % (ref 11.5–15.5)
WBC: 8.1 10*3/uL (ref 4.0–10.5)
nRBC: 0 % (ref 0.0–0.2)

## 2022-07-20 LAB — TYPE AND SCREEN
ABO/RH(D): A POS
Antibody Screen: NEGATIVE

## 2022-07-20 LAB — ABO/RH: ABO/RH(D): A POS

## 2022-07-20 LAB — PROTIME-INR
INR: 1.3 — ABNORMAL HIGH (ref 0.8–1.2)
Prothrombin Time: 15.9 seconds — ABNORMAL HIGH (ref 11.4–15.2)

## 2022-07-20 SURGERY — IR WITH ANESTHESIA
Anesthesia: General

## 2022-07-20 MED ORDER — SODIUM CHLORIDE 0.9 % IV SOLN
INTRAVENOUS | Status: AC
  Filled 2022-07-20: qty 20

## 2022-07-20 MED ORDER — PANTOPRAZOLE SODIUM 40 MG PO TBEC
40.0000 mg | DELAYED_RELEASE_TABLET | Freq: Every day | ORAL | Status: DC
  Administered 2022-07-21: 40 mg via ORAL
  Filled 2022-07-20: qty 1

## 2022-07-20 MED ORDER — DOCUSATE SODIUM 100 MG PO CAPS
200.0000 mg | ORAL_CAPSULE | Freq: Every day | ORAL | Status: DC
  Administered 2022-07-20: 200 mg via ORAL
  Filled 2022-07-20: qty 2

## 2022-07-20 MED ORDER — SODIUM CHLORIDE 0.9 % IV SOLN
2.0000 g | INTRAVENOUS | Status: AC
  Administered 2022-07-20: 2 g via INTRAVENOUS

## 2022-07-20 MED ORDER — OXYCODONE HCL 5 MG/5ML PO SOLN: 5.0000 mg | Freq: Once | ORAL | Status: DC | PRN

## 2022-07-20 MED ORDER — ONDANSETRON HCL 4 MG/2ML IJ SOLN
INTRAMUSCULAR | Status: DC | PRN
  Administered 2022-07-20: 4 mg via INTRAVENOUS

## 2022-07-20 MED ORDER — DEXAMETHASONE SODIUM PHOSPHATE 10 MG/ML IJ SOLN
INTRAMUSCULAR | Status: DC | PRN
  Administered 2022-07-20: 8 mg via INTRAVENOUS

## 2022-07-20 MED ORDER — OXYCODONE HCL 5 MG PO TABS: 5.0000 mg | ORAL_TABLET | Freq: Once | ORAL | Status: DC | PRN

## 2022-07-20 MED ORDER — LACTATED RINGERS IV SOLN: INTRAVENOUS | Status: DC

## 2022-07-20 MED ORDER — ONDANSETRON HCL 4 MG/2ML IJ SOLN: 4.0000 mg | Freq: Once | INTRAMUSCULAR | Status: DC | PRN

## 2022-07-20 MED ORDER — MELATONIN 5 MG PO TABS: 5.0000 mg | ORAL_TABLET | Freq: Every evening | ORAL | Status: DC | PRN

## 2022-07-20 MED ORDER — MIDAZOLAM HCL 2 MG/2ML IJ SOLN
INTRAMUSCULAR | Status: DC | PRN
  Administered 2022-07-20: 1 mg via INTRAVENOUS

## 2022-07-20 MED ORDER — MIDAZOLAM HCL 2 MG/2ML IJ SOLN
INTRAMUSCULAR | Status: AC
  Filled 2022-07-20: qty 2

## 2022-07-20 MED ORDER — PROPOFOL 10 MG/ML IV BOLUS
INTRAVENOUS | Status: DC | PRN
  Administered 2022-07-20: 200 mg via INTRAVENOUS

## 2022-07-20 MED ORDER — SUCCINYLCHOLINE CHLORIDE 200 MG/10ML IV SOSY
PREFILLED_SYRINGE | INTRAVENOUS | Status: DC | PRN
  Administered 2022-07-20: 120 mg via INTRAVENOUS

## 2022-07-20 MED ORDER — ONDANSETRON HCL 4 MG/2ML IJ SOLN: 4.0000 mg | Freq: Four times a day (QID) | INTRAMUSCULAR | Status: DC | PRN

## 2022-07-20 MED ORDER — HYDROMORPHONE HCL 1 MG/ML IJ SOLN
0.2500 mg | INTRAMUSCULAR | Status: DC | PRN
  Administered 2022-07-20 (×3): 0.5 mg via INTRAVENOUS

## 2022-07-20 MED ORDER — ROCURONIUM BROMIDE 10 MG/ML (PF) SYRINGE
PREFILLED_SYRINGE | INTRAVENOUS | Status: DC | PRN
  Administered 2022-07-20: 15 mg via INTRAVENOUS
  Administered 2022-07-20: 60 mg via INTRAVENOUS

## 2022-07-20 MED ORDER — PHENYLEPHRINE HCL-NACL 20-0.9 MG/250ML-% IV SOLN
INTRAVENOUS | Status: DC | PRN
  Administered 2022-07-20: 35 ug/min via INTRAVENOUS

## 2022-07-20 MED ORDER — SODIUM CHLORIDE 0.9 % IV SOLN: INTRAVENOUS | Status: DC

## 2022-07-20 MED ORDER — ONDANSETRON 4 MG PO TBDP: 8.0000 mg | ORAL_TABLET | Freq: Three times a day (TID) | ORAL | Status: DC | PRN

## 2022-07-20 MED ORDER — CHLORHEXIDINE GLUCONATE 0.12 % MT SOLN: 15.0000 mL | OROMUCOSAL | Status: AC

## 2022-07-20 MED ORDER — FENTANYL CITRATE (PF) 250 MCG/5ML IJ SOLN
INTRAMUSCULAR | Status: DC | PRN
  Administered 2022-07-20: 100 ug via INTRAVENOUS

## 2022-07-20 MED ORDER — DIPHENHYDRAMINE HCL 25 MG PO CAPS: 50.0000 mg | ORAL_CAPSULE | Freq: Every day | ORAL | Status: DC | PRN

## 2022-07-20 MED ORDER — HYDROMORPHONE HCL 1 MG/ML IJ SOLN
INTRAMUSCULAR | Status: AC
  Filled 2022-07-20: qty 1

## 2022-07-20 MED ORDER — FUROSEMIDE 40 MG PO TABS
40.0000 mg | ORAL_TABLET | Freq: Two times a day (BID) | ORAL | Status: DC
  Administered 2022-07-20 – 2022-07-21 (×2): 40 mg via ORAL
  Filled 2022-07-20 (×2): qty 1

## 2022-07-20 MED ORDER — IOHEXOL 300 MG/ML  SOLN
100.0000 mL | Freq: Once | INTRAMUSCULAR | Status: AC | PRN
  Administered 2022-07-20: 65 mL via INTRAVENOUS

## 2022-07-20 MED ORDER — SPIRONOLACTONE 25 MG PO TABS
100.0000 mg | ORAL_TABLET | Freq: Two times a day (BID) | ORAL | Status: DC
  Administered 2022-07-20 – 2022-07-21 (×2): 100 mg via ORAL
  Filled 2022-07-20 (×2): qty 4

## 2022-07-20 MED ORDER — ALBUMIN HUMAN 5 % IV SOLN: INTRAVENOUS | Status: DC | PRN

## 2022-07-20 MED ORDER — OXYCODONE HCL 5 MG PO TABS
5.0000 mg | ORAL_TABLET | ORAL | Status: DC | PRN
  Administered 2022-07-20 – 2022-07-21 (×2): 5 mg via ORAL
  Filled 2022-07-20 (×2): qty 1

## 2022-07-20 MED ORDER — ALBUTEROL SULFATE (2.5 MG/3ML) 0.083% IN NEBU: 2.5000 mg | INHALATION_SOLUTION | Freq: Four times a day (QID) | RESPIRATORY_TRACT | Status: DC | PRN

## 2022-07-20 MED ORDER — CHLORHEXIDINE GLUCONATE 0.12 % MT SOLN
OROMUCOSAL | Status: AC
  Administered 2022-07-20: 15 mL via OROMUCOSAL
  Filled 2022-07-20: qty 15

## 2022-07-20 MED ORDER — VITAMIN D (ERGOCALCIFEROL) 1.25 MG (50000 UNIT) PO CAPS
50000.0000 [IU] | ORAL_CAPSULE | ORAL | Status: DC
  Administered 2022-07-21: 50000 [IU] via ORAL
  Filled 2022-07-20: qty 1

## 2022-07-20 MED ORDER — FENTANYL CITRATE (PF) 100 MCG/2ML IJ SOLN
INTRAMUSCULAR | Status: AC
  Filled 2022-07-20: qty 2

## 2022-07-20 MED ORDER — LACTATED RINGERS IV SOLN: INTRAVENOUS | Status: DC | PRN

## 2022-07-20 MED ORDER — LIDOCAINE 2% (20 MG/ML) 5 ML SYRINGE
INTRAMUSCULAR | Status: DC | PRN
  Administered 2022-07-20: 100 mg via INTRAVENOUS

## 2022-07-20 NOTE — Anesthesia Preprocedure Evaluation (Addendum)
Anesthesia Evaluation  Patient identified by MRN, date of birth, ID band Patient awake    Reviewed: Allergy & Precautions, NPO status , Patient's Chart, lab work & pertinent test results  Airway Mallampati: II  TM Distance: >3 FB Neck ROM: Full    Dental no notable dental hx. (+) Upper Dentures, Lower Dentures   Pulmonary Current Smoker and Patient abstained from smoking.   Pulmonary exam normal breath sounds clear to auscultation       Cardiovascular hypertension, Normal cardiovascular exam Rhythm:Regular Rate:Normal     Neuro/Psych   Anxiety Depression       GI/Hepatic ,,,(+) Cirrhosis       Lab Results      Component                Value               Date                      ALT                      18                  07/07/2022                AST                      31                  07/07/2022                ALKPHOS                  169 (H)             05/02/2022                BILITOT                  1.3 (H)             07/07/2022              Endo/Other    Renal/GU Lab Results      Component                Value               Date                      WBC                      7.7                 07/07/2022                HGB                      11.9 (L)            07/07/2022                HCT                      33.3 (L)            07/07/2022  MCV                      95.4                07/07/2022                PLT                      272                 07/07/2022                Musculoskeletal  (+) Arthritis ,    Abdominal  (+) + obese  Peds  Hematology Lab Results      Component                Value               Date                      WBC                      7.7                 07/07/2022                HGB                      11.9 (L)            07/07/2022                HCT                      33.3 (L)            07/07/2022                PLT                       272                 07/07/2022              Anesthesia Other Findings All: paxil, tylenol, Ultram  Reproductive/Obstetrics                             Anesthesia Physical Anesthesia Plan  ASA: 4  Anesthesia Plan: General   Post-op Pain Management: Minimal or no pain anticipated and Precedex   Induction: Intravenous  PONV Risk Score and Plan: 2 and Treatment may vary due to age or medical condition, Midazolam and Ondansetron  Airway Management Planned: Oral ETT  Additional Equipment: Arterial line  Intra-op Plan:   Post-operative Plan: Extubation in OR  Informed Consent: I have reviewed the patients History and Physical, chart, labs and discussed the procedure including the risks, benefits and alternatives for the proposed anesthesia with the patient or authorized representative who has indicated his/her understanding and acceptance.     Dental advisory given  Plan Discussed with: CRNA and Surgeon  Anesthesia Plan Comments:        Anesthesia Quick Evaluation

## 2022-07-20 NOTE — Sedation Documentation (Addendum)
PRE TIPS:   RA 15,   PV 35, Gradient 20  POST TIPS: RA 24,  PV 32, Gradient 8

## 2022-07-20 NOTE — Progress Notes (Signed)
Brief IR Note:  Patient seen alongside Dr. Anselm Pancoast.  He is awake and alert and complains of back pain.  Denies abdominal pain.  Answers questions appropriately.  Access sites are clean and dry without evidence of continued bleeding, hematoma, or pseudoaneurysm.  Lengthy conversation had with sister regarding procedure, post-procedure needs, and prognosis.  Phone numbers given for questions/concerns that arise.    Will tentatively plan on patient returning to [facility] for paracentesis 07/29/22.  Will place order. Anticipate beginning lactulose tomorrow and hopeful discharge.  Electronically Signed: Pasty Spillers, PA-C 07/20/2022, 5:31 PM

## 2022-07-20 NOTE — Anesthesia Procedure Notes (Signed)
Procedure Name: Intubation Date/Time: 07/20/2022 1:02 PM  Performed by: Inda Coke, CRNAPre-anesthesia Checklist: Patient identified, Emergency Drugs available, Suction available, Timeout performed and Patient being monitored Patient Re-evaluated:Patient Re-evaluated prior to induction Oxygen Delivery Method: Circle system utilized Preoxygenation: Pre-oxygenation with 100% oxygen Induction Type: IV induction, Rapid sequence and Cricoid Pressure applied Laryngoscope Size: Mac and 4 Grade View: Grade I Tube type: Oral Tube size: 7.5 mm Number of attempts: 1 Airway Equipment and Method: Stylet Placement Confirmation: ETT inserted through vocal cords under direct vision, positive ETCO2, CO2 detector and breath sounds checked- equal and bilateral Secured at: 22 cm Tube secured with: Tape Dental Injury: Teeth and Oropharynx as per pre-operative assessment

## 2022-07-20 NOTE — Transfer of Care (Signed)
Immediate Anesthesia Transfer of Care Note  Patient: Erik Howe  Procedure(s) Performed: TIPS  Patient Location: PACU  Anesthesia Type:General  Level of Consciousness: awake, alert , and oriented  Airway & Oxygen Therapy: Patient Spontanous Breathing and Patient connected to nasal cannula oxygen  Post-op Assessment: Report given to RN, Post -op Vital signs reviewed and stable, and Patient moving all extremities X 4  Post vital signs: Reviewed and stable  Last Vitals:  Vitals Value Taken Time  BP 135/71 07/20/22 1520  Temp 36.5 C 07/20/22 1520  Pulse 89 07/20/22 1529  Resp 16 07/20/22 1529  SpO2 92 % 07/20/22 1529  Vitals shown include unvalidated device data.  Last Pain:  Vitals:   07/20/22 1006  TempSrc:   PainSc: 0-No pain      Patients Stated Pain Goal: 0 (22/58/34 6219)  Complications: No notable events documented.

## 2022-07-20 NOTE — H&P (Signed)
Chief Complaint: Patient was seen in consultation today for portal hypertension; TIPS  Referring Physician(s): Dr. Gladstone Pih. Candis Schatz   Supervising Physician: Markus Daft  Patient Status: [facility] - Out-pt  History of Present Illness: Erik Howe is a 52 y.o. male with a medical history significant for HTN, anxiety/depression and obesity. 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  [name] is familiar to IR from numerous paracenteses starting in October 2023. Volume removed has averaged 9-12 L per procedure which the patient has required 2-3 time per month. Interventional Radiology was asked by Dr. Candis Schatz to evaluate this patient for a possible transjugular intrahepatic portosystemic shunt to help decrease the amount of ascites build up. [name] met with Dr. Anselm Pancoast 07/06/22 to discuss TIPS procedure and the risks, benefits and alternatives. Dr. Anselm Pancoast discussed the risks of potential hepatic encephalopathy, worsening liver function and even liver failure. The patient expressed an interest in pursuing TIPS creation with the understanding that it would be performed under general anesthesia with overnight observation in the hospital.   Past Medical History:  Diagnosis Date   Anxiety    Arthritis    Cellulitis 12/18/2012   left foot   Cirrhosis of liver (HCC)    Depression    GERD (gastroesophageal reflux disease)    Hypertension    no longer a problem   Obesity    Varicose veins     Past Surgical History:  Procedure Laterality Date   CHOLECYSTECTOMY     ENDOVENOUS ABLATION SAPHENOUS VEIN W/ LASER Left 09-30-2013   left greater saphenous vein   IR PARACENTESIS  05/03/2022   IR PARACENTESIS  05/18/2022   IR PARACENTESIS  06/02/2022   IR PARACENTESIS  06/10/2022   IR PARACENTESIS  06/24/2022   IR PARACENTESIS  06/30/2022   IR PARACENTESIS  07/08/2022   IR PARACENTESIS  07/15/2022     Allergies: Paxil [paroxetine], Tylenol [acetaminophen], and Ultram [tramadol]  Medications: Prior to Admission medications   Medication Sig Start Date End Date Taking? Authorizing Provider  albuterol (VENTOLIN HFA) 108 (90 Base) MCG/ACT inhaler Inhale 2 puffs into the lungs every 6 (six) hours as needed for shortness of breath 04/11/22  Yes   amitriptyline (ELAVIL) 10 MG tablet Take 1 Tablet by mouth nightly at bedtime 07/13/22  Yes   cyclobenzaprine (FLEXERIL) 10 MG tablet Take 1 Tablet by mouth nightly at bedtime as needed for muscle spasms Patient taking differently: Take 10 mg by mouth at bedtime. 12/15/21  Yes   diphenhydrAMINE (SOMINEX) 25 MG tablet Take 50 mg by mouth daily as needed for sleep or itching.   Yes [provider]  docusate sodium (COLACE) 100 MG capsule Take 200 mg by mouth at bedtime.   Yes [provider]  furosemide (LASIX) 80 MG tablet Take 1 tablet (80 mg total) by mouth in the morning AND 0.5 tablets (40 mg total) every evening. 06/17/22  Yes Daryel November, MD  melatonin 5 MG TABS Take 5 mg by mouth at bedtime as needed (Sleep).   Yes [provider]  omeprazole (PRILOSEC) 20 MG capsule Take 1 capsule (20 mg total) by mouth 2 (two) times daily for 8 weeks. 06/17/22  Yes Daryel November, MD  ondansetron (ZOFRAN-ODT) 8 MG disintegrating tablet Take 1 tablet (8 mg total) by mouth 3 (three) times daily as needed for nausea. 06/07/22  Yes   spironolactone (ALDACTONE) 100 MG tablet Take  1 tablet (100 mg total) by mouth 2 (two) times daily. 05/26/22  Yes Daryel November, MD  albuterol (VENTOLIN HFA) 108 (90 Base) MCG/ACT inhaler Inhale 2 puffs into the lungs every 6 (six) hours as neededshortness of breath . Patient not taking: Reported on 07/18/2022 04/11/22   Drue Flirt, MD  Na Sulfate-K Sulfate-Mg Sulf 17.5-3.13-1.6 GM/177ML SOLN Take as directed 06/27/22   Daryel November, MD  triamcinolone ointment (KENALOG) 0.1 % apply  a thin layer to the affected area(s) by topical route 2 times per day Patient not taking: Reported on 07/18/2022 09/17/21     Vitamin D, Ergocalciferol, (DRISDOL) 1.25 MG (50000 UNIT) CAPS capsule Take 1 capsule (50,000 Units total) by mouth every 7 (seven) days. Patient not taking: Reported on 07/18/2022 05/04/22   Daryel November, MD     Family History  Problem Relation Age of Onset   Varicose Veins Mother    Heart disease Mother    Diabetes Mother    Arthritis Mother    Hypertension Mother    Stroke Father    Heart disease Father    Colon cancer Neg Hx    Stomach cancer Neg Hx    Rectal cancer Neg Hx    Esophageal cancer Neg Hx     Social History   Socioeconomic History   Marital status: Single    Spouse name: Not on file   Number of children: Not on file   Years of education: Not on file   Highest education level: Not on file  Occupational History   Not on file  Tobacco Use   Smoking status: Some Days    Packs/day: 0.15    Types: Cigarettes   Smokeless tobacco: Never  Vaping Use   Vaping Use: Never used  Substance and Sexual Activity   Alcohol use: No   Drug use: No   Sexual activity: Not on file  Other Topics Concern   Not on file  Social History Narrative   Not on file   Social Determinants of Health   Financial Resource Strain: Not on file  Food Insecurity: Not on file  Transportation Needs: Not on file  Physical Activity: Not on file  Stress: Not on file  Social Connections: Not on file    Review of Systems: A 12 point ROS discussed and pertinent positives are indicated in the HPI above.  All other systems are negative.  Review of Systems  Constitutional:  Negative for appetite change and fatigue.  Respiratory:  Positive for shortness of breath. Negative for cough.   Cardiovascular:  Positive for leg swelling. Negative for chest pain.  Gastrointestinal:  Positive for nausea. Negative for abdominal pain, diarrhea and vomiting.  Neurological:   Negative for dizziness and headaches.  Psychiatric/Behavioral:  Negative for confusion and decreased concentration.     Vital Signs: BP 109/73   Pulse 94   Temp 98.7 F (37.1 C) (Oral)   Resp 20   Ht '6\' 2"'$  (1.88 m)   Wt 270 lb (122.5 kg)   SpO2 99%   BMI 34.67 kg/m   Physical Exam Constitutional:      General: He is not in acute distress.    Appearance: He is obese. He is not ill-appearing.  HENT:     Mouth/Throat:     Mouth: Mucous membranes are moist.     Pharynx: Oropharynx is clear.  Cardiovascular:     Rate and Rhythm: Normal rate and regular rhythm.     Pulses:  Normal pulses.     Heart sounds: Normal heart sounds.  Pulmonary:     Effort: Pulmonary effort is normal.     Breath sounds: Normal breath sounds.  Abdominal:     General: Bowel sounds are normal. There is distension.     Tenderness: There is no abdominal tenderness.  Musculoskeletal:     Right lower leg: Edema present.     Left lower leg: Edema present.  Skin:    General: Skin is warm and dry.     Comments: Diffuse dry, patchy skin.   Neurological:     Mental Status: He is alert and oriented to person, place, and time.  Psychiatric:        Mood and Affect: Mood normal.        Behavior: Behavior normal.        Thought Content: Thought content normal.     Imaging: IR Paracentesis  Result Date: 07/15/2022 INDICATION: Patient with history of cirrhosis with recurrent large volume ascites. Request for diagnostic and therapeutic paracentesis. 10 L maximum EXAM: ULTRASOUND GUIDED THERAPEUTIC AND DIAGNOSTIC PARACENTESIS MEDICATIONS: None. COMPLICATIONS: None immediate. PROCEDURE: Informed written consent was obtained from the patient after a discussion of the risks, benefits and alternatives to treatment. A timeout was performed prior to the initiation of the procedure. Initial ultrasound scanning demonstrates a large amount of ascites within the left lower abdominal quadrant. The left lower abdomen was  prepped and draped in the usual sterile fashion. 1% lidocaine was used for local anesthesia. Following this, a 19 gauge, 7-cm, Yueh catheter was introduced. An ultrasound image was saved for documentation purposes. The paracentesis was performed. The catheter was removed and a dressing was applied. The patient tolerated the procedure well without immediate post procedural complication. Patient received post-procedure intravenous albumin; see nursing notes for details. FINDINGS: A total of approximately 9.8 L of hazy yellow fluid was removed. Samples were sent to the laboratory as requested by the clinical team. IMPRESSION: Successful ultrasound-guided therapeutic and diagnostic paracentesis yielding 9.8 liters of peritoneal fluid. Read by: Rushie Nyhan, NP PLAN: The patient has previously been formally evaluated by the [facility] Interventional Radiology Portal Hypertension Clinic. Patient is scheduled for TIPS procedure on July 20, 2022. Electronically Signed   By: Lucrezia Europe M.D.   On: 07/15/2022 14:18   ECHOCARDIOGRAM COMPLETE  Result Date: 07/08/2022    ECHOCARDIOGRAM REPORT   Patient Name:   Erik Howe  Date of Exam: 07/08/2022 Medical Rec #:  [id number]     Height:       74.0 in Accession #:    [id number]    Weight:       287.0 lb Date of Birth:  Sep 18, 1970     BSA:          2.534 m Patient Age:    47 years      BP:           110/80 mmHg Patient Gender: M             HR:           82 bpm. Exam Location:  [place] Procedure: 2D Echo, Cardiac Doppler, Color Doppler and Intracardiac            Opacification Agent Indications:    Z01.812 Preoperative evaluation  History:        Patient has no prior history of Echocardiogram examinations.                 Risk  Factors:Former Smoker. Obesity. Left leg celluliti.  Sonographer:    Diamond Nickel RCS Referring Phys: [phone] ADAM HENN IMPRESSIONS  1. Left ventricular ejection fraction, by estimation, is 65 to 70%. The left ventricle has normal function.  The left ventricle has no regional wall motion abnormalities. Left ventricular diastolic parameters were normal.  2. Right ventricular systolic function is normal. The right ventricular size is normal.  3. The mitral valve is normal in structure. No evidence of mitral valve regurgitation. No evidence of mitral stenosis.  4. The aortic valve is tricuspid. There is mild calcification of the aortic valve. There is mild thickening of the aortic valve. Aortic valve regurgitation is not visualized. Aortic valve sclerosis is present, with no evidence of aortic valve stenosis.  5. The inferior vena cava is normal in size with greater than 50% respiratory variability, suggesting right atrial pressure of 3 mmHg. FINDINGS  Left Ventricle: Left ventricular ejection fraction, by estimation, is 65 to 70%. The left ventricle has normal function. The left ventricle has no regional wall motion abnormalities. The left ventricular internal cavity size was normal in size. There is  no left ventricular hypertrophy. Left ventricular diastolic parameters were normal. Right Ventricle: The right ventricular size is normal. No increase in right ventricular wall thickness. Right ventricular systolic function is normal. Left Atrium: Left atrial size was normal in size. Right Atrium: Right atrial size was normal in size. Pericardium: There is no evidence of pericardial effusion. Mitral Valve: The mitral valve is normal in structure. No evidence of mitral valve regurgitation. No evidence of mitral valve stenosis. Tricuspid Valve: The tricuspid valve is normal in structure. Tricuspid valve regurgitation is trivial. No evidence of tricuspid stenosis. Aortic Valve: The aortic valve is tricuspid. There is mild calcification of the aortic valve. There is mild thickening of the aortic valve. Aortic valve regurgitation is not visualized. Aortic valve sclerosis is present, with no evidence of aortic valve stenosis. Pulmonic Valve: The pulmonic valve was  normal in structure. Pulmonic valve regurgitation is not visualized. No evidence of pulmonic stenosis. Aorta: The aortic root is normal in size and structure. Venous: The inferior vena cava is normal in size with greater than 50% respiratory variability, suggesting right atrial pressure of 3 mmHg. IAS/Shunts: No atrial level shunt detected by color flow Doppler.  LEFT VENTRICLE PLAX 2D LVIDd:         4.00 cm   Diastology LVIDs:         2.10 cm   LV e' medial:    8.70 cm/s LV PW:         1.00 cm   LV E/e' medial:  9.0 LV IVS:        1.00 cm   LV e' lateral:   12.30 cm/s LVOT diam:     2.20 cm   LV E/e' lateral: 6.3 LV SV:         122 LV SV Index:   48 LVOT Area:     3.80 cm  RIGHT VENTRICLE RV Basal diam:  2.90 cm RV S prime:     15.10 cm/s TAPSE (M-mode): 2.4 cm LEFT ATRIUM             Index        RIGHT ATRIUM           Index LA diam:        2.70 cm 1.07 cm/m   RA Area:     12.60 cm LA Vol (A2C):   40.2 ml  15.86 ml/m  RA Volume:   27.90 ml  11.01 ml/m LA Vol (A4C):   36.9 ml 14.56 ml/m LA Biplane Vol: 42.0 ml 16.57 ml/m  AORTIC VALVE LVOT Vmax:   155.00 cm/s LVOT Vmean:  90.600 cm/s LVOT VTI:    0.321 m  AORTA Ao Root diam: 3.40 cm Ao Asc diam:  3.50 cm MITRAL VALVE MV Area (PHT): 3.85 cm     SHUNTS MV Decel Time: 197 msec     Systemic VTI:  0.32 m MV E velocity: 78.10 cm/s   Systemic Diam: 2.20 cm MV A velocity: 101.00 cm/s MV E/A ratio:  0.77 Jenkins Rouge MD Electronically signed by Jenkins Rouge MD Signature Date/Time: 07/08/2022/4:31:13 PM    Final    IR Paracentesis  Result Date: 07/08/2022 INDICATION: Patient with a history of cirrhosis with recurrent large volume ascites. Interventional radiology asked to perform a diagnostic and therapeutic paracentesis. EXAM: ULTRASOUND GUIDED PARACENTESIS MEDICATIONS: 1% lidocaine 10 mL COMPLICATIONS: None immediate. PROCEDURE: Informed written consent was obtained from the patient after a discussion of the risks, benefits and alternatives to treatment. A  timeout was performed prior to the initiation of the procedure. Initial ultrasound scanning demonstrates a large amount of ascites within the right lower abdominal quadrant. The right lower abdomen was prepped and draped in the usual sterile fashion. 1% lidocaine was used for local anesthesia. Following this, a 19 gauge, 7-cm, Yueh catheter was introduced. An ultrasound image was saved for documentation purposes. The paracentesis was performed. The catheter was removed and a dressing was applied. The patient tolerated the procedure well without immediate post procedural complication. Patient received post-procedure intravenous albumin; see nursing notes for details. FINDINGS: A total of approximately 9.8 L of hazy yellow fluid was removed. Samples were sent to the laboratory as requested by the clinical team. IMPRESSION: Successful ultrasound-guided paracentesis yielding 9.8 liters of peritoneal fluid. Read by: Soyla Dryer, NP PLAN: The patient has previously been formally evaluated by the [facility] Interventional Radiology Portal Hypertension Clinic and is being actively followed for potential future intervention. ** Patient met with Dr. Anselm Pancoast 07/06/22 and will undergo further labs/diagnostic studies before moving forward with TIPS. Electronically Signed   By: Markus Daft M.D.   On: 07/08/2022 14:22   IR Paracentesis  Result Date: 06/30/2022 INDICATION: Recurrent ascites, history of cirrhosis EXAM: ULTRASOUND GUIDED THERAPEUTIC PARACENTESIS MEDICATIONS: None. COMPLICATIONS: None immediate. PROCEDURE: Informed written consent was obtained from the patient after a discussion of the risks, benefits and alternatives to treatment. A timeout was performed prior to the initiation of the procedure. Initial ultrasound scanning demonstrates a large amount of ascites within the right lower abdominal quadrant. The right lower abdomen was prepped and draped in the usual sterile fashion. 1% lidocaine was used for local  anesthesia. Following this, a 19 gauge, 7-cm, Yueh catheter was introduced. An ultrasound image was saved for documentation purposes. The paracentesis was performed. The catheter was removed and a dressing was applied. The patient tolerated the procedure well without immediate post procedural complication. Patient received post-procedure intravenous albumin; see nursing notes for details. FINDINGS: A total of approximately 10.2 L of milky colored ascitic fluid was removed. Samples were sent to the laboratory as requested by the clinical team. IMPRESSION: Successful ultrasound-guided paracentesis yielding 10.2 L of peritoneal fluid. RIGHT and performed by Pasty Spillers IR PA PLAN: The patient has required >/=2 paracenteses in a 30 day period and a formal evaluation by the [facility] Portal Hypertension Clinic has been arranged. Electronically  Signed   By: Michaelle Birks M.D.   On: 06/30/2022 17:50   IR Paracentesis  Result Date: 06/24/2022 INDICATION: Patient with a history of cirrhosis with recurrent large volume ascites. Interventional radiology asked to perform a diagnostic and therapeutic paracentesis. EXAM: ULTRASOUND GUIDED left lower quadrant PARACENTESIS MEDICATIONS: 6 cc 1% lidocaine COMPLICATIONS: None immediate. PROCEDURE: Informed written consent was obtained from the patient after a discussion of the risks, benefits and alternatives to treatment. A timeout was performed prior to the initiation of the procedure. Initial ultrasound scanning demonstrates a large amount of ascites within the left lower abdominal quadrant. The left lower abdomen was prepped and draped in the usual sterile fashion. 1% lidocaine was used for local anesthesia. Following this, a 19 gauge, 7-cm, Yueh catheter was introduced. An ultrasound image was saved for documentation purposes. The paracentesis was performed. The catheter was removed and a dressing was applied. The patient tolerated the procedure  well without immediate post procedural complication. Patient received post-procedure intravenous albumin; see nursing notes for details. FINDINGS: A total of approximately 6.4 L of cloudy yellow fluid was removed. Samples were sent to the laboratory as requested by the clinical team. IMPRESSION: Successful ultrasound-guided paracentesis yielding 6.4 L liters of peritoneal fluid. PLAN: The patient has required >/=2 paracenteses in a 30 day period and a formal evaluation by the [place] [facility] Portal Hypertension [facility] has been arranged per procedure dictation from 06/10/2022. Read by: Reatha Armour, PA-C Electronically Signed   By: Ruthann Cancer M.D.   On: 06/24/2022 15:55    Labs:  CBC: Recent Labs    03/30/22 1031 04/12/22 1805 07/07/22 1354 07/20/22 0942  WBC 7.2 7.4 7.7 8.1  HGB 13.6 12.7* 11.9* 11.3*  HCT 40.8 37.7* 33.3* 34.3*  PLT 144* 199 272 215    COAGS: Recent Labs    03/11/22 0246 04/12/22 1805 07/07/22 1354  INR 1.6* 1.5* 1.2*  APTT  --  37*  --     BMP: Recent Labs    03/10/22 1848 03/30/22 1031 04/12/22 1805 05/02/22 1441 05/20/22 1057 07/07/22 1354  NA 134* 135 132* 135 130* 129*  K 3.5 3.4* 4.2 3.8 4.7 4.2  CL 104 105 102 101 97 95*  CO2 21* '26 25 28 27 25  '$ GLUCOSE 97 93 102* 91 116* 88  BUN '6 8 11 7 11 12  '$ CALCIUM 8.0* 8.2* 7.9* 8.5 8.0* 8.5*  CREATININE 0.94 0.78 0.87 0.78 1.00 1.11  GFRNONAA >60 >60 >60  --   --   --     LIVER FUNCTION TESTS: Recent Labs    03/10/22 1848 03/30/22 1031 04/12/22 1805 05/02/22 1441 07/07/22 1354  BILITOT 2.5* 2.1* 2.0* 1.5* 1.3*  AST 69* 61* 61* 41* 31  ALT 34 32 32 26 18  ALKPHOS 205* 217* 190* 169*  --   PROT 6.3* 6.6 6.0* 6.8 6.0*  ALBUMIN 2.7* 2.6* 2.5* 2.9*  --     TUMOR MARKERS: Recent Labs    05/02/22 1441  AFPTM 4.9    Assessment and Plan:  Cirrhosis with portal hypertension and recurrent large volume ascites: Erik Howe C. Orene Desanctis, 52 year old male, presents today to the  [facility] department for an image-guided transjugular intrahepatic portosystemic shunt (TIPS) creation. The procedure will be done under general anesthesia with planned overnight observation for one night.  Risks and benefits of TIPS, BRTO and/or additional variceal embolization were discussed with the patient and/or the patient's family including, but not limited to, infection, bleeding, damage  to adjacent structures, worsening hepatic and/or cardiac function, worsening and/or the development of altered mental status/encephalopathy, non-target embolization and death.   This interventional procedure involves the use of X-rays and because of the nature of the planned procedure, it is possible that we will have prolonged use of X-ray fluoroscopy.  Potential radiation risks to you include (but are not limited to) the following: - A slightly elevated risk for cancer  several years later in life. This risk is typically less than 0.5% percent. This risk is low in comparison to the normal incidence of human cancer, which is 33% for women and 50% for men according to the [employer]. - Radiation induced injury can include skin redness, resembling a rash, tissue breakdown / ulcers and hair loss (which can be temporary or permanent).   The likelihood of either of these occurring depends on the difficulty of the procedure and whether you are sensitive to radiation due to previous procedures, disease, or genetic conditions.   IF your procedure requires a prolonged use of radiation, you will be notified and given written instructions for further action.  It is your responsibility to monitor the irradiated area for the 2 weeks following the procedure and to notify your physician if you are concerned that you have suffered a radiation induced injury.    All of the patient's questions were answered, patient is agreeable to proceed. He has been NPO. He does not take any  anticoagulating medications. Vitals have been reviewed. Labs are pending but will be reviewed prior to the start of the procedure.   Consent signed and in chart.  Thank you for this interesting consult.  I greatly enjoyed meeting CARRY ORTEZ and look forward to participating in their care.  A copy of this report was sent to the requesting provider on this date.  Electronically Signed: Soyla Dryer, AGACNP-BC [phone] 07/20/2022, 11:14 AM   I spent a total of  30 Minutes   in face to face in clinical consultation, greater than 50% of which was counseling/coordinating care for TIPS

## 2022-07-20 NOTE — Sedation Documentation (Addendum)
Refer to anesthesia documentation from now on.

## 2022-07-20 NOTE — Procedures (Addendum)
IInterventional Radiology Procedure:   Indications: Cirrhosis with ascites  Procedure: TIPS and paracentesis  Findings: Removed approximately 4 liters of ascites.  Successful TIPS using fluoroscopy and ICE.  Pre-TIPS pressure gradient = 20 mmHg and post TIPS gradient = 8 mmHg.    Complications: No immediate complications noted.     EBL: Minimal  Plan: PACU for recovery and overnight admission for observation.     Christana Angelica R. Anselm Pancoast, MD  Pager: [phone]

## 2022-07-21 ENCOUNTER — Other Ambulatory Visit: Payer: Self-pay

## 2022-07-21 ENCOUNTER — Encounter (HOSPITAL_COMMUNITY): Payer: Self-pay | Admitting: Diagnostic Radiology

## 2022-07-21 DIAGNOSIS — K7460 Unspecified cirrhosis of liver: Secondary | ICD-10-CM | POA: Diagnosis not present

## 2022-07-21 LAB — CBC
HCT: 28.5 % — ABNORMAL LOW (ref 39.0–52.0)
Hemoglobin: 10 g/dL — ABNORMAL LOW (ref 13.0–17.0)
MCH: 32.7 pg (ref 26.0–34.0)
MCHC: 35.1 g/dL (ref 30.0–36.0)
MCV: 93.1 fL (ref 80.0–100.0)
Platelets: 171 10*3/uL (ref 150–400)
RBC: 3.06 MIL/uL — ABNORMAL LOW (ref 4.22–5.81)
RDW: 12.7 % (ref 11.5–15.5)
WBC: 17.3 10*3/uL — ABNORMAL HIGH (ref 4.0–10.5)
nRBC: 0 % (ref 0.0–0.2)

## 2022-07-21 LAB — COMPREHENSIVE METABOLIC PANEL
ALT: 37 U/L (ref 0–44)
AST: 95 U/L — ABNORMAL HIGH (ref 15–41)
Albumin: 2.7 g/dL — ABNORMAL LOW (ref 3.5–5.0)
Alkaline Phosphatase: 95 U/L (ref 38–126)
Anion gap: 8 (ref 5–15)
BUN: 10 mg/dL (ref 6–20)
CO2: 23 mmol/L (ref 22–32)
Calcium: 8.5 mg/dL — ABNORMAL LOW (ref 8.9–10.3)
Chloride: 96 mmol/L — ABNORMAL LOW (ref 98–111)
Creatinine, Ser: 1 mg/dL (ref 0.61–1.24)
GFR, Estimated: >60 mL/min (ref >60)
Glucose, Bld: 107 mg/dL — ABNORMAL HIGH (ref 70–99)
Potassium: 5.1 mmol/L (ref 3.5–5.1)
Sodium: 127 mmol/L — ABNORMAL LOW (ref 135–145)
Total Bilirubin: 1.5 mg/dL — ABNORMAL HIGH (ref 0.3–1.2)
Total Protein: 5.4 g/dL — ABNORMAL LOW (ref 6.5–8.1)

## 2022-07-21 LAB — PROTIME-INR
INR: 1.5 — ABNORMAL HIGH (ref 0.8–1.2)
Prothrombin Time: 17.5 seconds — ABNORMAL HIGH (ref 11.4–15.2)

## 2022-07-21 MED ORDER — LACTULOSE 10 GM/15ML PO SOLN
30.0000 g | Freq: Three times a day (TID) | ORAL | 2 refills | Status: DC
  Filled 2022-07-21: qty 1892, 14d supply, fill #0
  Filled 2022-08-04: qty 1892, 14d supply, fill #1

## 2022-07-21 NOTE — Discharge Summary (Signed)
Patient ID: Erik Howe MRN: [id number] DOB/AGE: [birth year] 52 y.o.  Admit date: 07/20/2022 Discharge date: 07/21/2022  Supervising Physician: Erik Howe  Patient Status: [facility] - In-pt  Admission Diagnoses: Cirrhosis with ascites  Discharge Diagnoses:  Principal Problem:   Cirrhosis of liver with ascites [facility])   Discharged Condition: good  Hospital Course: 52 y/o M with history of cirrhosis and recurrent ascites requiring frequent paracentesis who presented to [facility] as an outpatient on 07/20/22 for TIPS procedure. Patient underwent uncomplicated TIPS procedure and 4.0 L paracentesis with Dr. Anselm Pancoast under general anesthesia. He was admitted for overnight observation.   Overnight course unremarkable, patient seen at bedside this morning - sister present during exam. Patient denies complaints, is eager to return home. Discussed need for compliance with lactulose and IR follow up which he understands and is agreeable to. Plan is for patient to stay with his sister in [place] for several days while he recovers and then return to [place], they plan to drive tomorrow morning. Also reviewed leukocytosis - explained this is likely reactive from the procedure and no infection is suspected given no fever or other complaints but important to monitor for fevers, chills, confusion, persistent n/v, abdominal pain and if any of these occur to call our office or present to the ED for further evaluation.  Patient will return for paracentesis 07/26/22 and will follow up with Dr. Anselm Pancoast in [place] [facility] in about a month. Contact information placed in AVS and patient encouraged to call with any questions or concerns prior to his follow up.  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 Patient has refractory ascites that requires frequent large volume paracentesis. Patient's combined MELD score = 19. EXAM: 1.  Ultrasound-guided paracentesis 2. Ultrasound-guided access of the right internal jugular vein 3. Ultrasound-guided access of the right common femoral vein 4. Hepatic venogram 5. Intravascular ultrasound 6. Catheterization of the portal vein 7. Portal venous and central manometry 8. Portal venogram 9. Creation of a transhepatic portal vein to hepatic vein shunt MEDICATIONS: As antibiotic prophylaxis, Rocephin 1 gm IV was ordered pre-procedure and administered intravenously within one hour of incision. ANESTHESIA/SEDATION: General - as administered by the Anesthesia department CONTRAST:  65 mL Omnipaque 300, intravenous FLUOROSCOPY TIME:  Radiation Exposure Index (as provided by the fluoroscopic device): 325 mGy Kerma COMPLICATIONS: None immediate. PROCEDURE: Informed written consent was obtained from the patient after a thorough discussion of the procedural risks, benefits and alternatives. All questions were addressed. Maximal Sterile Barrier Technique was utilized including caps, mask, sterile gowns, sterile gloves, sterile drape, hand hygiene and skin antiseptic. A timeout was performed prior to the initiation of the procedure. Right side of the abdomen was prepped and draped in sterile fashion. Ultrasound demonstrated a large volume in the right lower quadrant the abdomen. Ultrasound image was saved for documentation. Small incision was made. Using ultrasound guidance, a Safe-T-Centesis catheter was directed into the peritoneal fluid. Approximately 4 L of fluid was removed during the procedure. A preliminary ultrasound of the right groin was performed and demonstrates a patent right common femoral vein. A permanent ultrasound image was recorded. Using a combination of fluoroscopy and ultrasound, an access site was determined. A small dermatotomy was made at the planned puncture site. Using ultrasound guidance, access into the right common femoral vein was obtained with visualization of needle entry into the  vessel using a standard micropuncture technique. A wire was advanced  into the IVC. An 8 [place] vascular sheath was placed into the external iliac vein. Through this access site, an 66 [place] ICE catheter was advanced with ease under fluoroscopic guidance to the level of the intrahepatic inferior vena cava. A preliminary ultrasound of the right neck was performed and demonstrates a patent internal jugular vein. A permanent ultrasound image was recorded. Using a combination of fluoroscopy and ultrasound, an access site was determined. A small dermatotomy was made at the planned puncture site. Using ultrasound guidance, access into the right internal jugular vein was obtained with visualization of needle entry into the vessel using a standard micropuncture technique. A wire was advanced into the IVC and serial fascial dilation performed. A 10 French tips sheath was placed into the internal jugular vein and advanced to the IVC. The jugular sheath was retracted into the right atrium and manometry was performed. A 5 French angled tip catheter was then directed into the middle hepatic vein. Hepatic venogram was performed. These images demonstrated patent hepatic vein with no stenosis. The catheter was advanced to a wedge portion of the a patent vein over which the 10 French sheath was advanced into the middle hepatic vein. Using ICE ultrasound, catheter was visualized in the middle hepatic vein and the portal anatomy was defined. A planned exit site from the hepatic vein and puncture site from the portal vein was placed into a single sonographic plane. Under direct ultrasound visualization, the ScorpionX needle was advanced into the central middle portal vein. A Glidewire Advantage was then advanced into the portal vein and SMV. A 5 French marking pigtail catheter was then advanced over the wire into the main portal vein and wire removed. Portal venogram was performed which demonstrated a patent portal vein. A few  very small varices were seen arising from the main portal vein. Portal manometry was then performed. The tract was then dilated to 8 mm with an 8 mm x 8 cm Athletis balloon. A 8-10 mm x 7 + 2 cm of Viatorr endograft was placed. This was ultimately dilated to 8 mm. After placement of the shunt, right atrial and portal pressures were repeated. 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 The catheters and sheath were removed and manual compression was applied to the right internal jugular and right common femoral venous access sites until hemostasis was achieved. The patient was transferred to the PACU in stable condition. Pre-TIPS Mean Pressures (mmHg): Right atrium: 15 Portal vein: 35 Portosystemic gradient: 20 Post-TIPS Mean Pressures (mmHg): Right atrium:24 Portal vein: 32 Portosystemic gradient: 8 IMPRESSION: 1. Successful transjugular portosystemic shunt creation. 2. Portosystemic gradient of 20 mm Hg (absolute portal venous pressure 35 mm Hg) before shunt placement and 8 mm Hg (absolute portal venous pressure 32 mm Hg) after shunt placement. PLAN: Admit to the hospital overnight for observation. Electronically Signed   By: Markus Daft M.D.   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 Patient has refractory ascites that requires frequent large volume paracentesis. Patient's combined MELD score = 19. EXAM: 1. Ultrasound-guided paracentesis 2. Ultrasound-guided access of the right internal jugular vein 3. Ultrasound-guided access of the right common femoral vein 4. Hepatic venogram 5. Intravascular ultrasound 6. Catheterization of the portal vein 7. Portal venous and central manometry 8. Portal venogram 9. Creation of a transhepatic portal vein to hepatic vein shunt MEDICATIONS: As antibiotic prophylaxis, Rocephin  1 gm IV was ordered pre-procedure and  administered intravenously within one hour of incision. ANESTHESIA/SEDATION: General - as administered by the Anesthesia department CONTRAST:  65 mL Omnipaque 300, intravenous FLUOROSCOPY TIME:  Radiation Exposure Index (as provided by the fluoroscopic device): 720 mGy Kerma COMPLICATIONS: None immediate. PROCEDURE: Informed written consent was obtained from the patient after a thorough discussion of the procedural risks, benefits and alternatives. All questions were addressed. Maximal Sterile Barrier Technique was utilized including caps, mask, sterile gowns, sterile gloves, sterile drape, hand hygiene and skin antiseptic. A timeout was performed prior to the initiation of the procedure. Right side of the abdomen was prepped and draped in sterile fashion. Ultrasound demonstrated a large volume in the right lower quadrant the abdomen. Ultrasound image was saved for documentation. Small incision was made. Using ultrasound guidance, a Safe-T-Centesis catheter was directed into the peritoneal fluid. Approximately 4 L of fluid was removed during the procedure. A preliminary ultrasound of the right groin was performed and demonstrates a patent right common femoral vein. A permanent ultrasound image was recorded. Using a combination of fluoroscopy and ultrasound, an access site was determined. A small dermatotomy was made at the planned puncture site. Using ultrasound guidance, access into the right common femoral vein was obtained with visualization of needle entry into the vessel using a standard micropuncture technique. A wire was advanced into the IVC. An 8 [place] vascular sheath was placed into the external iliac vein. Through this access site, an 69 [place] ICE catheter was advanced with ease under fluoroscopic guidance to the level of the intrahepatic inferior vena cava. A preliminary ultrasound of the right neck was performed and demonstrates a patent internal jugular vein. A permanent ultrasound image was  recorded. Using a combination of fluoroscopy and ultrasound, an access site was determined. A small dermatotomy was made at the planned puncture site. Using ultrasound guidance, access into the right internal jugular vein was obtained with visualization of needle entry into the vessel using a standard micropuncture technique. A wire was advanced into the IVC and serial fascial dilation performed. A 10 French tips sheath was placed into the internal jugular vein and advanced to the IVC. The jugular sheath was retracted into the right atrium and manometry was performed. A 5 French angled tip catheter was then directed into the middle hepatic vein. Hepatic venogram was performed. These images demonstrated patent hepatic vein with no stenosis. The catheter was advanced to a wedge portion of the a patent vein over which the 10 French sheath was advanced into the middle hepatic vein. Using ICE ultrasound, catheter was visualized in the middle hepatic vein and the portal anatomy was defined. A planned exit site from the hepatic vein and puncture site from the portal vein was placed into a single sonographic plane. Under direct ultrasound visualization, the ScorpionX needle was advanced into the central middle portal vein. A Glidewire Advantage was then advanced into the portal vein and SMV. A 5 French marking pigtail catheter was then advanced over the wire into the main portal vein and wire removed. Portal venogram was performed which demonstrated a patent portal vein. 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 Portal manometry was then performed. The tract was then dilated to 8 mm with an 8 mm x 8 cm Athletis balloon. A 8-10 mm x 7 + 2 cm of Viatorr endograft was placed. This was ultimately dilated to 8 mm. After placement of the shunt, right atrial and portal  pressures were repeated. 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 The  catheters and sheath were removed and manual compression was applied to the right internal jugular and right common femoral venous access sites until hemostasis was achieved. The patient was transferred to the PACU in stable condition. Pre-TIPS Mean Pressures (mmHg): Right atrium: 15 Portal vein: 35 Portosystemic gradient: 20 Post-TIPS Mean Pressures (mmHg): Right atrium:24 Portal vein: 32 Portosystemic gradient: 8 IMPRESSION: 1. Successful transjugular portosystemic shunt creation. 2. Portosystemic gradient of 20 mm Hg (absolute portal venous pressure 35 mm Hg) before shunt placement and 8 mm Hg (absolute portal venous pressure 32 mm Hg) after shunt placement. PLAN: Admit to the hospital overnight for observation. Electronically Signed   By: Markus Daft M.D.   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 Patient has refractory ascites that requires frequent large volume paracentesis. Patient's combined MELD score = 19. EXAM: 1. Ultrasound-guided paracentesis 2. Ultrasound-guided access of the right internal jugular vein 3. Ultrasound-guided access of the right common femoral vein 4. Hepatic venogram 5. Intravascular ultrasound 6. Catheterization of the portal vein 7. Portal venous and central manometry 8. Portal venogram 9. Creation of a transhepatic portal vein to hepatic vein shunt MEDICATIONS: As antibiotic prophylaxis, Rocephin 1 gm IV was ordered pre-procedure and administered intravenously within one hour of incision. ANESTHESIA/SEDATION: General - as administered by the Anesthesia department CONTRAST:  65 mL Omnipaque 300, intravenous FLUOROSCOPY TIME:  Radiation Exposure Index (as provided by the fluoroscopic device): 546 mGy Kerma COMPLICATIONS: None immediate. PROCEDURE: Informed written consent was obtained from the patient after a thorough discussion of the procedural risks, benefits and alternatives. All  questions were addressed. Maximal Sterile Barrier Technique was utilized including caps, mask, sterile gowns, sterile gloves, sterile drape, hand hygiene and skin antiseptic. A timeout was performed prior to the initiation of the procedure. Right side of the abdomen was prepped and draped in sterile fashion. Ultrasound demonstrated a large volume in the right lower quadrant the abdomen. Ultrasound image was saved for documentation. Small incision was made. Using ultrasound guidance, a Safe-T-Centesis catheter was directed into the peritoneal fluid. Approximately 4 L of fluid was removed during the procedure. A preliminary ultrasound of the right groin was performed and demonstrates a patent right common femoral vein. A permanent ultrasound image was recorded. Using a combination of fluoroscopy and ultrasound, an access site was determined. A small dermatotomy was made at the planned puncture site. Using ultrasound guidance, access into the right common femoral vein was obtained with visualization of needle entry into the vessel using a standard micropuncture technique. A wire was advanced into the IVC. An 8 [place] vascular sheath was placed into the external iliac vein. Through this access site, an 62 [place] ICE catheter was advanced with ease under fluoroscopic guidance to the level of the intrahepatic inferior vena cava. A preliminary ultrasound of the right neck was performed and demonstrates a patent internal jugular vein. A permanent ultrasound image was recorded. Using a combination of fluoroscopy and ultrasound, an access site was determined. A small dermatotomy was made at the planned puncture site. Using ultrasound guidance, access into the right internal jugular vein was obtained with visualization of needle entry into the vessel using a standard micropuncture technique. A wire was advanced into the IVC and serial fascial dilation performed. A 10 French tips sheath was placed into the internal  jugular vein  and advanced to the IVC. The jugular sheath was retracted into the right atrium and manometry was performed. A 5 French angled tip catheter was then directed into the middle hepatic vein. Hepatic venogram was performed. These images demonstrated patent hepatic vein with no stenosis. The catheter was advanced to a wedge portion of the a patent vein over which the 10 French sheath was advanced into the middle hepatic vein. Using ICE ultrasound, catheter was visualized in the middle hepatic vein and the portal anatomy was defined. A planned exit site from the hepatic vein and puncture site from the portal vein was placed into a single sonographic plane. Under direct ultrasound visualization, the ScorpionX needle was advanced into the central middle portal vein. A Glidewire Advantage was then advanced into the portal vein and SMV. A 5 French marking pigtail catheter was then advanced over the wire into the main portal vein and wire removed. Portal venogram was performed which demonstrated a patent portal vein. 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 Portal manometry was then performed. The tract was then dilated to 8 mm with an 8 mm x 8 cm Athletis balloon. A 8-10 mm x 7 + 2 cm of Viatorr endograft was placed. This was ultimately dilated to 8 mm. After placement of the shunt, right atrial and portal pressures were repeated. 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 The catheters and sheath were removed and manual compression was applied to the right internal jugular and right common femoral venous access sites until hemostasis was achieved. The patient was transferred to the PACU in stable condition. Pre-TIPS Mean Pressures (mmHg): Right atrium: 15 Portal vein: 35 Portosystemic gradient: 20 Post-TIPS Mean Pressures (mmHg): Right atrium:24 Portal vein: 32 Portosystemic gradient: 8 IMPRESSION: 1. Successful transjugular  portosystemic shunt creation. 2. Portosystemic gradient of 20 mm Hg (absolute portal venous pressure 35 mm Hg) before shunt placement and 8 mm Hg (absolute portal venous pressure 32 mm Hg) after shunt placement. PLAN: Admit to the hospital overnight for observation. Electronically Signed   By: Markus Daft M.D.   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 Patient has refractory ascites that requires frequent large volume paracentesis. Patient's combined MELD score = 19. EXAM: 1. Ultrasound-guided paracentesis 2. Ultrasound-guided access of the right internal jugular vein 3. Ultrasound-guided access of the right common femoral vein 4. Hepatic venogram 5. Intravascular ultrasound 6. Catheterization of the portal vein 7. Portal venous and central manometry 8. Portal venogram 9. Creation of a transhepatic portal vein to hepatic vein shunt MEDICATIONS: As antibiotic prophylaxis, Rocephin 1 gm IV was ordered pre-procedure and administered intravenously within one hour of incision. ANESTHESIA/SEDATION: General - as administered by the Anesthesia department CONTRAST:  65 mL Omnipaque 300, intravenous FLUOROSCOPY TIME:  Radiation Exposure Index (as provided by the fluoroscopic device): 638 mGy Kerma COMPLICATIONS: None immediate. PROCEDURE: Informed written consent was obtained from the patient after a thorough discussion of the procedural risks, benefits and alternatives. All questions were addressed. Maximal Sterile Barrier Technique was utilized including caps, mask, sterile gowns, sterile gloves, sterile drape, hand hygiene and skin antiseptic. A timeout was performed prior to the initiation of the procedure. Right side of the abdomen was prepped and draped in sterile fashion. Ultrasound demonstrated a large volume in the right lower quadrant the abdomen. Ultrasound image was saved for documentation. Small  incision  was made. Using ultrasound guidance, a Safe-T-Centesis catheter was directed into the peritoneal fluid. Approximately 4 L of fluid was removed during the procedure. A preliminary ultrasound of the right groin was performed and demonstrates a patent right common femoral vein. A permanent ultrasound image was recorded. Using a combination of fluoroscopy and ultrasound, an access site was determined. A small dermatotomy was made at the planned puncture site. Using ultrasound guidance, access into the right common femoral vein was obtained with visualization of needle entry into the vessel using a standard micropuncture technique. A wire was advanced into the IVC. An 8 [place] vascular sheath was placed into the external iliac vein. Through this access site, an 4 [place] ICE catheter was advanced with ease under fluoroscopic guidance to the level of the intrahepatic inferior vena cava. A preliminary ultrasound of the right neck was performed and demonstrates a patent internal jugular vein. A permanent ultrasound image was recorded. Using a combination of fluoroscopy and ultrasound, an access site was determined. A small dermatotomy was made at the planned puncture site. Using ultrasound guidance, access into the right internal jugular vein was obtained with visualization of needle entry into the vessel using a standard micropuncture technique. A wire was advanced into the IVC and serial fascial dilation performed. A 10 French tips sheath was placed into the internal jugular vein and advanced to the IVC. The jugular sheath was retracted into the right atrium and manometry was performed. A 5 French angled tip catheter was then directed into the middle hepatic vein. Hepatic venogram was performed. These images demonstrated patent hepatic vein with no stenosis. The catheter was advanced to a wedge portion of the a patent vein over which the 10 French sheath was advanced into the middle hepatic vein. Using ICE  ultrasound, catheter was visualized in the middle hepatic vein and the portal anatomy was defined. A planned exit site from the hepatic vein and puncture site from the portal vein was placed into a single sonographic plane. Under direct ultrasound visualization, the ScorpionX needle was advanced into the central middle portal vein. A Glidewire Advantage was then advanced into the portal vein and SMV. A 5 French marking pigtail catheter was then advanced over the wire into the main portal vein and wire removed. Portal venogram was performed which demonstrated a patent portal vein. 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 Portal manometry was then performed. The tract was then dilated to 8 mm with an 8 mm x 8 cm Athletis balloon. A 8-10 mm x 7 + 2 cm of Viatorr endograft was placed. This was ultimately dilated to 8 mm. After placement of the shunt, right atrial and portal pressures were repeated. 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 The catheters and sheath were removed and manual compression was applied to the right internal jugular and right common femoral venous access sites until hemostasis was achieved. The patient was transferred to the PACU in stable condition. Pre-TIPS Mean Pressures (mmHg): Right atrium: 15 Portal vein: 35 Portosystemic gradient: 20 Post-TIPS Mean Pressures (mmHg): Right atrium:24 Portal vein: 32 Portosystemic gradient: 8 IMPRESSION: 1. Successful transjugular portosystemic shunt creation. 2. Portosystemic gradient of 20 mm Hg (absolute portal venous pressure 35 mm Hg) before shunt placement and 8 mm Hg (absolute portal venous pressure 32 mm Hg) after shunt placement. PLAN: Admit to the hospital overnight for observation. Electronically Signed   By: Scherrie Gerlach.D.  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 Patient has refractory ascites that requires frequent large volume paracentesis. Patient's combined MELD score = 19. EXAM: 1. Ultrasound-guided paracentesis 2. Ultrasound-guided access of the right internal jugular vein 3. Ultrasound-guided access of the right common femoral vein 4. Hepatic venogram 5. Intravascular ultrasound 6. Catheterization of the portal vein 7. Portal venous and central manometry 8. Portal venogram 9. Creation of a transhepatic portal vein to hepatic vein shunt MEDICATIONS: As antibiotic prophylaxis, Rocephin 1 gm IV was ordered pre-procedure and administered intravenously within one hour of incision. ANESTHESIA/SEDATION: General - as administered by the Anesthesia department CONTRAST:  65 mL Omnipaque 300, intravenous FLUOROSCOPY TIME:  Radiation Exposure Index (as provided by the fluoroscopic device): 751 mGy Kerma COMPLICATIONS: None immediate. PROCEDURE: Informed written consent was obtained from the patient after a thorough discussion of the procedural risks, benefits and alternatives. All questions were addressed. Maximal Sterile Barrier Technique was utilized including caps, mask, sterile gowns, sterile gloves, sterile drape, hand hygiene and skin antiseptic. A timeout was performed prior to the initiation of the procedure. Right side of the abdomen was prepped and draped in sterile fashion. Ultrasound demonstrated a large volume in the right lower quadrant the abdomen. Ultrasound image was saved for documentation. Small incision was made. Using ultrasound guidance, a Safe-T-Centesis catheter was directed into the peritoneal fluid. Approximately 4 L of fluid was removed during the procedure. A preliminary ultrasound of the right groin was performed and demonstrates a patent right common femoral vein. A permanent ultrasound image was recorded. Using a combination of fluoroscopy and ultrasound, an access site was determined. A small dermatotomy was made  at the planned puncture site. Using ultrasound guidance, access into the right common femoral vein was obtained with visualization of needle entry into the vessel using a standard micropuncture technique. A wire was advanced into the IVC. An 8 [place] vascular sheath was placed into the external iliac vein. Through this access site, an 61 [place] ICE catheter was advanced with ease under fluoroscopic guidance to the level of the intrahepatic inferior vena cava. A preliminary ultrasound of the right neck was performed and demonstrates a patent internal jugular vein. A permanent ultrasound image was recorded. Using a combination of fluoroscopy and ultrasound, an access site was determined. A small dermatotomy was made at the planned puncture site. Using ultrasound guidance, access into the right internal jugular vein was obtained with visualization of needle entry into the vessel using a standard micropuncture technique. A wire was advanced into the IVC and serial fascial dilation performed. A 10 French tips sheath was placed into the internal jugular vein and advanced to the IVC. The jugular sheath was retracted into the right atrium and manometry was performed. A 5 French angled tip catheter was then directed into the middle hepatic vein. Hepatic venogram was performed. These images demonstrated patent hepatic vein with no stenosis. The catheter was advanced to a wedge portion of the a patent vein over which the 10 French sheath was advanced into the middle hepatic vein. Using ICE ultrasound, catheter was visualized in the middle hepatic vein and the portal anatomy was defined. A planned exit site from the hepatic vein and puncture site from the portal vein was placed into a single sonographic plane. Under direct ultrasound visualization, the ScorpionX needle was advanced into the central middle portal vein. A Glidewire Advantage was then  advanced into the portal vein and SMV. A 5 French marking pigtail  catheter was then advanced over the wire into the main portal vein and wire removed. Portal venogram was performed which demonstrated a patent portal vein. 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 Portal manometry was then performed. The tract was then dilated to 8 mm with an 8 mm x 8 cm Athletis balloon. A 8-10 mm x 7 + 2 cm of Viatorr endograft was placed. This was ultimately dilated to 8 mm. After placement of the shunt, right atrial and portal pressures were repeated. 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 The catheters and sheath were removed and manual compression was applied to the right internal jugular and right common femoral venous access sites until hemostasis was achieved. The patient was transferred to the PACU in stable condition. Pre-TIPS Mean Pressures (mmHg): Right atrium: 15 Portal vein: 35 Portosystemic gradient: 20 Post-TIPS Mean Pressures (mmHg): Right atrium:24 Portal vein: 32 Portosystemic gradient: 8 IMPRESSION: 1. Successful transjugular portosystemic shunt creation. 2. Portosystemic gradient of 20 mm Hg (absolute portal venous pressure 35 mm Hg) before shunt placement and 8 mm Hg (absolute portal venous pressure 32 mm Hg) after shunt placement. PLAN: Admit to the hospital overnight for observation. Electronically Signed   By: Markus Daft M.D.   On: 07/20/2022 16:31   IR Paracentesis  Result Date: 07/15/2022 INDICATION: Patient with history of cirrhosis with recurrent large volume ascites. Request for diagnostic and therapeutic paracentesis. 10 L maximum EXAM: ULTRASOUND GUIDED THERAPEUTIC AND DIAGNOSTIC PARACENTESIS MEDICATIONS: None. COMPLICATIONS: None immediate. PROCEDURE: Informed written consent was obtained from the patient after a discussion of the risks, benefits and alternatives to treatment. A timeout was performed prior to the initiation of the procedure. Initial ultrasound scanning  demonstrates a large amount of ascites within the left lower abdominal quadrant. The left lower abdomen was prepped and draped in the usual sterile fashion. 1% lidocaine was used for local anesthesia. Following this, a 19 gauge, 7-cm, Yueh catheter was introduced. An ultrasound image was saved for documentation purposes. The paracentesis was performed. The catheter was removed and a dressing was applied. The patient tolerated the procedure well without immediate post procedural complication. Patient received post-procedure intravenous albumin; see nursing notes for details. FINDINGS: A total of approximately 9.8 L of hazy yellow fluid was removed. Samples were sent to the laboratory as requested by the clinical team. IMPRESSION: Successful ultrasound-guided therapeutic and diagnostic paracentesis yielding 9.8 liters of peritoneal fluid. Read by: Rushie Nyhan, NP PLAN: The patient has previously been formally evaluated by the [facility]pert[facility]. Patient is scheduled for TIPS procedure on July 20, 2022. Electronically Signed   By: Lucrezia Europe M.D.   On: 07/15/2022 14:18   ECHOCARDIOGRAM COMPLETE  Result Date: 07/08/2022    ECHOCARDIOGRAM REPORT   Patient Name:   JOEZIAH VOIT  Date of Exam: 07/08/2022 Medical Rec #:  [id number]     Height:       74.0 in Accession #:    [id number]    Weight:       287.0 lb Date of Birth:  01/27/1971     BSA:          2.534 m Patient Age:    55 years      BP:           110/80 mmHg Patient Gender: M  HR:           82 bpm. Exam Location:  [address] Procedure: 2D Echo, Cardiac Doppler, Color Doppler and Intracardiac            Opacification Agent Indications:    Z01.812 Preoperative evaluation  History:        Patient has no prior history of Echocardiogram examinations.                 Risk Factors:Former Smoker. Obesity. Left leg celluliti.  Sonographer:    Diamond Nickel RCS Referring Phys: [phone] ADAM HENN IMPRESSIONS  1.  Left ventricular ejection fraction, by estimation, is 65 to 70%. The left ventricle has normal function. The left ventricle has no regional wall motion abnormalities. Left ventricular diastolic parameters were normal.  2. Right ventricular systolic function is normal. The right ventricular size is normal.  3. The mitral valve is normal in structure. No evidence of mitral valve regurgitation. No evidence of mitral stenosis.  4. The aortic valve is tricuspid. There is mild calcification of the aortic valve. There is mild thickening of the aortic valve. Aortic valve regurgitation is not visualized. Aortic valve sclerosis is present, with no evidence of aortic valve stenosis.  5. The inferior vena cava is normal in size with greater than 50% respiratory variability, suggesting right atrial pressure of 3 mmHg. FINDINGS  Left Ventricle: Left ventricular ejection fraction, by estimation, is 65 to 70%. The left ventricle has normal function. The left ventricle has no regional wall motion abnormalities. The left ventricular internal cavity size was normal in size. There is  no left ventricular hypertrophy. Left ventricular diastolic parameters were normal. Right Ventricle: The right ventricular size is normal. No increase in right ventricular wall thickness. Right ventricular systolic function is normal. Left Atrium: Left atrial size was normal in size. Right Atrium: Right atrial size was normal in size. Pericardium: There is no evidence of pericardial effusion. Mitral Valve: The mitral valve is normal in structure. No evidence of mitral valve regurgitation. No evidence of mitral valve stenosis. Tricuspid Valve: The tricuspid valve is normal in structure. Tricuspid valve regurgitation is trivial. No evidence of tricuspid stenosis. Aortic Valve: The aortic valve is tricuspid. There is mild calcification of the aortic valve. There is mild thickening of the aortic valve. Aortic valve regurgitation is not visualized. Aortic  valve sclerosis is present, with no evidence of aortic valve stenosis. Pulmonic Valve: The pulmonic valve was normal in structure. Pulmonic valve regurgitation is not visualized. No evidence of pulmonic stenosis. Aorta: The aortic root is normal in size and structure. Venous: The inferior vena cava is normal in size with greater than 50% respiratory variability, suggesting right atrial pressure of 3 mmHg. IAS/Shunts: No atrial level shunt detected by color flow Doppler.  LEFT VENTRICLE PLAX 2D LVIDd:         4.00 cm   Diastology LVIDs:         2.10 cm   LV e' medial:    8.70 cm/s LV PW:         1.00 cm   LV E/e' medial:  9.0 LV IVS:        1.00 cm   LV e' lateral:   12.30 cm/s LVOT diam:     2.20 cm   LV E/e' lateral: 6.3 LV SV:         122 LV SV Index:   48 LVOT Area:     3.80 cm  RIGHT VENTRICLE RV Basal diam:  2.90 cm RV S prime:     15.10 cm/s TAPSE (M-mode): 2.4 cm LEFT ATRIUM             Index        RIGHT ATRIUM           Index LA diam:        2.70 cm 1.07 cm/m   RA Area:     12.60 cm LA Vol (A2C):   40.2 ml 15.86 ml/m  RA Volume:   27.90 ml  11.01 ml/m LA Vol (A4C):   36.9 ml 14.56 ml/m LA Biplane Vol: 42.0 ml 16.57 ml/m  AORTIC VALVE LVOT Vmax:   155.00 cm/s LVOT Vmean:  90.600 cm/s LVOT VTI:    0.321 m  AORTA Ao Root diam: 3.40 cm Ao Asc diam:  3.50 cm MITRAL VALVE MV Area (PHT): 3.85 cm     SHUNTS MV Decel Time: 197 msec     Systemic VTI:  0.32 m MV E velocity: 78.10 cm/s   Systemic Diam: 2.20 cm MV A velocity: 101.00 cm/s MV E/A ratio:  0.77 Jenkins Rouge MD Electronically signed by Jenkins Rouge MD Signature Date/Time: 07/08/2022/4:31:13 PM    Final    IR Paracentesis  Result Date: 07/08/2022 INDICATION: Patient with a history of cirrhosis with recurrent large volume ascites. Interventional radiology asked to perform a diagnostic and therapeutic paracentesis. EXAM: ULTRASOUND GUIDED PARACENTESIS MEDICATIONS: 1% lidocaine 10 mL COMPLICATIONS: None immediate. PROCEDURE: Informed written consent  was obtained from the patient after a discussion of the risks, benefits and alternatives to treatment. A timeout was performed prior to the initiation of the procedure. Initial ultrasound scanning demonstrates a large amount of ascites within the right lower abdominal quadrant. The right lower abdomen was prepped and draped in the usual sterile fashion. 1% lidocaine was used for local anesthesia. Following this, a 19 gauge, 7-cm, Yueh catheter was introduced. An ultrasound image was saved for documentation purposes. The paracentesis was performed. The catheter was removed and a dressing was applied. The patient tolerated the procedure well without immediate post procedural complication. Patient received post-procedure intravenous albumin; see nursing notes for details. FINDINGS: A total of approximately 9.8 L of hazy yellow fluid was removed. Samples were sent to the laboratory as requested by the clinical team. IMPRESSION: Successful ultrasound-guided paracentesis yielding 9.8 liters of peritoneal fluid. Read by: Soyla Dryer, NP PLAN: The patient has previously been formally evaluated by the [facility] Interventional Radiology Portal Hypertension Clinic and is being actively followed for potential future intervention. ** Patient met with Dr. Anselm Pancoast 07/06/22 and will undergo further labs/diagnostic studies before moving forward with TIPS. Electronically Signed   By: Markus Daft M.D.   On: 07/08/2022 14:22   IR Paracentesis  Result Date: 06/30/2022 INDICATION: Recurrent ascites, history of cirrhosis EXAM: ULTRASOUND GUIDED THERAPEUTIC PARACENTESIS MEDICATIONS: None. COMPLICATIONS: None immediate. PROCEDURE: Informed written consent was obtained from the patient after a discussion of the risks, benefits and alternatives to treatment. A timeout was performed prior to the initiation of the procedure. Initial ultrasound scanning demonstrates a large amount of ascites within the right lower abdominal quadrant. The  right lower abdomen was prepped and draped in the usual sterile fashion. 1% lidocaine was used for local anesthesia. Following this, a 19 gauge, 7-cm, Yueh catheter was introduced. An ultrasound image was saved for documentation purposes. The paracentesis was performed. The catheter was removed and a dressing was applied. The patient tolerated the procedure well without immediate post procedural complication. Patient received  post-procedure intravenous albumin; see nursing notes for details. FINDINGS: A total of approximately 10.2 L of milky colored ascitic fluid was removed. Samples were sent to the laboratory as requested by the clinical team. IMPRESSION: Successful ultrasound-guided paracentesis yielding 10.2 L of peritoneal fluid. RIGHT and performed by Pasty Spillers IR PA PLAN: The patient has required >/=2 paracenteses in a 30 day period and a formal evaluation by the [facility] has been arranged. Electronically Signed   By: Michaelle Birks M.D.   On: 06/30/2022 17:50   IR Paracentesis  Result Date: 06/24/2022 INDICATION: Patient with a history of cirrhosis with recurrent large volume ascites. Interventional radiology asked to perform a diagnostic and therapeutic paracentesis. EXAM: ULTRASOUND GUIDED left lower quadrant PARACENTESIS MEDICATIONS: 6 cc 1% lidocaine COMPLICATIONS: None immediate. PROCEDURE: Informed written consent was obtained from the patient after a discussion of the risks, benefits and alternatives to treatment. A timeout was performed prior to the initiation of the procedure. Initial ultrasound scanning demonstrates a large amount of ascites within the left lower abdominal quadrant. The left lower abdomen was prepped and draped in the usual sterile fashion. 1% lidocaine was used for local anesthesia. Following this, a 19 gauge, 7-cm, Yueh catheter was introduced. An ultrasound image was saved for documentation purposes. The paracentesis  was performed. The catheter was removed and a dressing was applied. The patient tolerated the procedure well without immediate post procedural complication. Patient received post-procedure intravenous albumin; see nursing notes for details. FINDINGS: A total of approximately 6.4 L of cloudy yellow fluid was removed. Samples were sent to the laboratory as requested by the clinical team. IMPRESSION: Successful ultrasound-guided paracentesis yielding 6.4 L liters of peritoneal fluid. PLAN: The patient has required >/=2 paracenteses in a 30 day period and a formal evaluation by the [facility] has been arranged per procedure dictation from 06/10/2022. Read by: Reatha Armour, PA-C Electronically Signed   By: Erik Howe M.D.   On: 06/24/2022 15:55    Treatments: IV hydration and analgesia: Oxycodone  Discharge Exam: Blood pressure 108/65, pulse 97, temperature 98.2 F (36.8 C), temperature source Oral, resp. rate 17, height '6\' 2"'$  (1.88 m), weight 270 lb (122.5 kg), SpO2 100 %. Physical Exam Vitals and nursing note reviewed.  Constitutional:      General: He is not in acute distress.    Appearance: He is not ill-appearing.  HENT:     Head: Normocephalic.  Eyes:     General: No scleral icterus. Cardiovascular:     Rate and Rhythm: Normal rate and regular rhythm.     Comments: (+) right IJ puncture site clean, dry, dressed appropriately - dressing removed without active bleeding (+) RUQ puncture site clean, dry, dressed appropriately - dressing removed without active bleeding Pulmonary:     Effort: Pulmonary effort is normal.     Breath sounds: Normal breath sounds.  Abdominal:     General: There is no distension.     Palpations: Abdomen is soft.     Tenderness: There is no abdominal tenderness.  Skin:    General: Skin is warm and dry.     Coloration: Skin is not jaundiced.  Neurological:     Mental Status: He is alert and oriented to  person, place, and time.  Psychiatric:        Mood and Affect: Mood normal.        Behavior: Behavior normal.        Thought Content: Thought  content normal.        Judgment: Judgment normal.     Disposition: Discharge disposition: 01-Home or Self Care        Allergies as of 07/21/2022       Reactions   Paxil [paroxetine] Other (See Comments)   Stomach issues Felt "wrong"   Tylenol [acetaminophen] Other (See Comments)   Told to avoid due to liver issues   Ultram [tramadol] Nausea Only        Medication List     TAKE these medications    albuterol 108 (90 Base) MCG/ACT inhaler Commonly known as: VENTOLIN HFA Inhale 2 puffs into the lungs every 6 (six) hours as needed for shortness of breath   Ventolin HFA 108 (90 Base) MCG/ACT inhaler Generic drug: albuterol Inhale 2 puffs into the lungs every 6 (six) hours as neededshortness of breath .   amitriptyline 10 MG tablet Commonly known as: ELAVIL Take 1 Tablet by mouth nightly at bedtime   cyclobenzaprine 10 MG tablet Commonly known as: FLEXERIL Take 1 Tablet by mouth nightly at bedtime as needed for muscle spasms What changed:  how much to take how to take this when to take this   diphenhydrAMINE 25 MG tablet Commonly known as: SOMINEX Take 50 mg by mouth daily as needed for sleep or itching.   docusate sodium 100 MG capsule Commonly known as: COLACE Take 200 mg by mouth at bedtime.   furosemide 80 MG tablet Commonly known as: Lasix Take 1 tablet (80 mg total) by mouth in the morning AND 0.5 tablets (40 mg total) every evening.   lactulose 10 GM/15ML solution Commonly known as: CHRONULAC Take 45 mLs (30 g total) by mouth 3 (three) times daily.   melatonin 5 MG Tabs Take 5 mg by mouth at bedtime as needed (Sleep).   Na Sulfate-K Sulfate-Mg Sulf 17.5-3.13-1.6 GM/177ML Soln Take as directed   omeprazole 20 MG capsule Commonly known as: PRILOSEC Take 1 capsule (20 mg total) by mouth 2 (two) times  daily for 8 weeks.   ondansetron 8 MG disintegrating tablet Commonly known as: ZOFRAN-ODT Take 1 tablet (8 mg total) by mouth 3 (three) times daily as needed for nausea.   spironolactone 100 MG tablet Commonly known as: ALDACTONE Take 1 tablet (100 mg total) by mouth 2 (two) times daily.   triamcinolone ointment 0.1 % Commonly known as: KENALOG apply a thin layer to the affected area(s) by topical route 2 times per day   Vitamin D (Ergocalciferol) 1.25 MG (50000 UNIT) Caps capsule Commonly known as: DRISDOL Take 1 capsule (50,000 Units total) by mouth every 7 (seven) days.          Electronically Signed: Joaquim Nam, PA-C 07/21/2022, 10:48 AM   I have spent Greater Than 30 Minutes discharging [name].

## 2022-07-21 NOTE — Progress Notes (Signed)
[name] to be D/C'd Home per MD order.  Discussed with the patient and all questions fully answered.  VSS, Skin clean, dry and intact without evidence of skin break down, no evidence of skin tears noted. IV catheter discontinued intact. Site without signs and symptoms of complications. Dressing and pressure applied.  An After Visit Summary was printed and given to the patient. Patient prescriptions sent to pharmacy.  D/c education completed with patient/family including follow up instructions, medication list, d/c activities limitations if indicated, with other d/c instructions as indicated by MD - patient able to verbalize understanding, all questions fully answered.   Patient instructed to return to ED, call 911, or call MD for any changes in condition.   Patient escorted via [place], and D/C home via private auto.  Manuella Ghazi 07/21/2022 11:01 AM

## 2022-07-21 NOTE — Anesthesia Postprocedure Evaluation (Signed)
Anesthesia Post Note  Patient: Erik Howe  Procedure(s) Performed: TIPS     Patient location during evaluation: PACU Anesthesia Type: General Level of consciousness: awake and alert Pain management: pain level controlled Vital Signs Assessment: post-procedure vital signs reviewed and stable Respiratory status: spontaneous breathing, nonlabored ventilation, respiratory function stable and patient connected to nasal cannula oxygen Cardiovascular status: blood pressure returned to baseline and stable Postop Assessment: no apparent nausea or vomiting Anesthetic complications: no  No notable events documented.  Last Vitals:  Vitals:   07/21/22 0547 07/21/22 0809  BP: (!) 104/55 108/65  Pulse: 92 97  Resp: 17 17  Temp: 36.7 C 36.8 C  SpO2: 97% 100%    Last Pain:  Vitals:   07/21/22 0809  TempSrc: Oral  PainSc: 0-No pain                 Barnet Glasgow

## 2022-07-25 ENCOUNTER — Telehealth (HOSPITAL_COMMUNITY): Payer: Self-pay | Admitting: Student

## 2022-07-25 NOTE — Telephone Encounter (Signed)
   [facility] TIPS post-procedure phone call follow-up   Erik Howe is a 52 y.o. male who underwent TIPS creation at [facility] 07/20/22 by Dr. Anselm Pancoast.   Patient is 5 day out from his procedure.   # of paracentesis in last month: 5 including one during TIPS procedure # of paracentesis in last 2 months: 7  Current diuretic regimen: Lasix 80 mg in the morning, 40 mg at night. Spironolactone 100 mg BID Current pharmacologic encephalopathy prophylaxis/treatment: Lactulose 10 g TID  Post-TIPS Imaging: None    Labs: 07/21/22 Creatinine: 1.0 Total Bilirubin: 1.5 INR: 1.5 Sodium: 127 Albumin: 2.7 Ammonia: 43 on 07/07/22  Assessment: Erik Howe is a 52 y.o. male with history of Cirrhosis. Patient is 5 days from TIPS creation and is feeling well. He states he mostly just feels weak with occasional shortness of breath. He denies nausea/vomiting, abdominal pain, headache or dizziness. He is taking all prescribed medications as directed. He states he is having one bowel  movement per day. He endorses mild discomfort at his procedure puncture sites. Mr. Bonn denies significant abdominal swelling. He is scheduled this Friday, 07/29/22 for a paracentesis. He knows he can call our clinic with any questions/concerns.   Recommendation:  Continue current treatment plan with next Clinic follow up scheduled for 08/01/22.  Electronically Signed: Theresa Duty, NP 07/25/2022, 10:50 AM

## 2022-07-26 ENCOUNTER — Other Ambulatory Visit (HOSPITAL_COMMUNITY): Payer: No Typology Code available for payment source

## 2022-07-29 ENCOUNTER — Ambulatory Visit (HOSPITAL_COMMUNITY)
Admission: RE | Admit: 2022-07-29 | Discharge: 2022-07-29 | Disposition: A | Discharge status: Home / Self Care | Payer: Medicaid Other | Source: Ambulatory Visit | Attending: Gastroenterology | Admitting: Gastroenterology

## 2022-07-29 DIAGNOSIS — K7469 Other cirrhosis of liver: Secondary | ICD-10-CM

## 2022-07-29 DIAGNOSIS — R188 Other ascites: Secondary | ICD-10-CM | POA: Diagnosis not present

## 2022-07-29 HISTORY — PX: IR PARACENTESIS: IMG2679

## 2022-07-29 LAB — BODY FLUID CELL COUNT WITH DIFFERENTIAL
Eos, Fluid: 0 %
Lymphs, Fluid: 84 %
Monocyte-Macrophage-Serous Fluid: 12 % — ABNORMAL LOW (ref 50–90)
Neutrophil Count, Fluid: 4 % (ref 0–25)
Total Nucleated Cell Count, Fluid: 1172 cu mm — ABNORMAL HIGH (ref 0–1000)

## 2022-07-29 MED ORDER — LIDOCAINE HCL 1 % IJ SOLN
INTRAMUSCULAR | Status: AC
  Administered 2022-07-29: 10 mL
  Filled 2022-07-29: qty 20

## 2022-07-29 NOTE — Procedures (Signed)
Ultrasound-guided diagnostic and therapeutic paracentesis performed yielding 2.8  liters of amber colored fluid. Fluid was sent to lab for analysis. No immediate complications. EBL is none.

## 2022-08-01 LAB — PATHOLOGIST SMEAR REVIEW

## 2022-08-02 ENCOUNTER — Telehealth (HOSPITAL_COMMUNITY): Payer: Self-pay | Admitting: Student

## 2022-08-02 NOTE — Telephone Encounter (Signed)
   [facility] TIPS post-procedure phone call follow-up   Erik Howe is a 52 y.o. male who underwent TIPS creation at [facility] 07/20/22 by Dr. Anselm Pancoast.    Patient is 2 weeks out from his procedure.    # of paracentesis in last month: 6 including one during TIPS procedure # of paracentesis in last 2 months: 8   Current diuretic regimen: Lasix 80 mg in the morning, 40 mg at night. Spironolactone 100 mg BID Current pharmacologic encephalopathy prophylaxis/treatment: Lactulose 10 g TID   Post-TIPS Imaging: None    Labs: 07/21/22 Creatinine: 1.0 Total Bilirubin: 1.5 INR: 1.5 Sodium: 127 Albumin: 2.7 Ammonia: 43 on 07/07/22   Assessment: Erik Howe is a 52 y.o. male with history of Cirrhosis. Patient is two weeks from TIPS creation and states he feels about the same as  he did when I spoke with him last week. He continues to feel moderately fatigued and gets short of breath with exertion. He has some residual mild discomfort at his procedure sites. He states he is only having one bowel movement a day or one every other day but he otherwise feels well.   He had a paracentesis 07/29/21 with 2.8 L removed. He has no questions or concerns at this time. He is taking all of his medications as directed.   He knows he can call our clinic with any questions/concerns.    Recommendation:   Continue current treatment plan with next Clinic follow up scheduled for 08/01/22.    Electronically Signed: Theresa Duty, NP 08/02/2022, 12:25 PM

## 2022-08-04 ENCOUNTER — Other Ambulatory Visit: Payer: Self-pay

## 2022-08-04 ENCOUNTER — Other Ambulatory Visit: Payer: Self-pay | Admitting: Gastroenterology

## 2022-08-04 MED ORDER — SPIRONOLACTONE 100 MG PO TABS
100.0000 mg | ORAL_TABLET | Freq: Two times a day (BID) | ORAL | 2 refills | Status: DC
  Filled 2022-08-04: qty 30, 15d supply, fill #0
  Filled 2022-08-18: qty 30, 15d supply, fill #1
  Filled 2022-08-31: qty 30, 15d supply, fill #2

## 2022-08-05 ENCOUNTER — Other Ambulatory Visit: Payer: Self-pay

## 2022-08-11 ENCOUNTER — Other Ambulatory Visit: Payer: Self-pay | Admitting: Diagnostic Radiology

## 2022-08-11 DIAGNOSIS — K7460 Unspecified cirrhosis of liver: Secondary | ICD-10-CM

## 2022-08-12 ENCOUNTER — Ambulatory Visit (HOSPITAL_COMMUNITY)
Admission: RE | Admit: 2022-08-12 | Discharge: 2022-08-12 | Disposition: A | Discharge status: Home / Self Care | Payer: Medicaid Other | Source: Ambulatory Visit | Attending: Physician Assistant | Admitting: Physician Assistant

## 2022-08-12 DIAGNOSIS — K7460 Unspecified cirrhosis of liver: Secondary | ICD-10-CM | POA: Diagnosis not present

## 2022-08-12 DIAGNOSIS — R188 Other ascites: Secondary | ICD-10-CM | POA: Insufficient documentation

## 2022-08-12 HISTORY — PX: IR PARACENTESIS: IMG2679

## 2022-08-12 MED ORDER — LIDOCAINE HCL 1 % IJ SOLN
INTRAMUSCULAR | Status: AC
  Administered 2022-08-12: 8 mL via INTRADERMAL
  Filled 2022-08-12: qty 20

## 2022-08-12 MED ORDER — ALBUMIN HUMAN 25 % IV SOLN
50.0000 g | Freq: Once | INTRAVENOUS | Status: AC
  Filled 2022-08-12: qty 200

## 2022-08-12 MED ORDER — ALBUMIN HUMAN 25 % IV SOLN
INTRAVENOUS | Status: AC
  Administered 2022-08-12: 50 g via INTRAVENOUS
  Filled 2022-08-12: qty 200

## 2022-08-12 NOTE — Progress Notes (Addendum)
        [facility] TIPS post-procedure follow-up   Erik Howe is a 52 y.o. male who underwent TIPS creation at [facility] 07/20/22 by Dr. Anselm Pancoast.    Patient is a little over 3 weeks out from his procedure.    # of paracentesis in last month: 4 including one during TIPS procedure # of paracentesis in last 2 months: 7   Current diuretic regimen: Lasix 80 mg in the morning, 40 mg at night. Spironolactone 100 mg BID Current pharmacologic encephalopathy prophylaxis/treatment: Lactulose 10 g TID   Post-TIPS Imaging: None    Labs: 07/21/22 Creatinine: 1.0 Total Bilirubin: 1.5 INR: 1.5 Sodium: 127 Albumin: 2.7 Ammonia: 43 on 07/07/22   Assessment: Erik Howe is a 52 y.o. male with history of Cirrhosis. Patient is a little over 3 weeks from TIPS creation. He was evaluated today while he was at [facility] for a paracentesis. He states he is slowly feeling better and most of his symptoms are fading. He still endorses some shortness of breath with exertion. He is still only having one bowel movement a day or every other day.  He has no complaints at this time. He is taking all of his medications as directed.    8.7 L of clear yellow fluid removed today during paracentesis.     Recommendation:   Continue current treatment plan with next Clinic follow up scheduled for 08/24/22 with Dr. Anselm Pancoast. Mr Bournes knows he can call me/call the clinic any time with questions/concerns.   Soyla Dryer, [place] [phone] 08/12/2022, 11:54 AM

## 2022-08-12 NOTE — Procedures (Signed)
PROCEDURE SUMMARY:  Successful US guided paracentesis from left lower quadrant.  Yielded 8.7 L of clear yellow fluid.  No immediate complications.  Pt tolerated well.   Specimen not sent for labs.  EBL < 2 mL  Theresa Duty, NP 08/12/2022 11:50 AM

## 2022-08-15 ENCOUNTER — Encounter: Payer: Self-pay | Admitting: Gastroenterology

## 2022-08-17 ENCOUNTER — Other Ambulatory Visit: Payer: Self-pay

## 2022-08-17 DIAGNOSIS — K7460 Unspecified cirrhosis of liver: Secondary | ICD-10-CM

## 2022-08-18 ENCOUNTER — Other Ambulatory Visit: Payer: Self-pay

## 2022-08-18 ENCOUNTER — Encounter (HOSPITAL_COMMUNITY): Payer: Self-pay | Admitting: Gastroenterology

## 2022-08-18 ENCOUNTER — Other Ambulatory Visit: Payer: Self-pay | Admitting: Gastroenterology

## 2022-08-18 DIAGNOSIS — K269 Duodenal ulcer, unspecified as acute or chronic, without hemorrhage or perforation: Secondary | ICD-10-CM

## 2022-08-19 ENCOUNTER — Ambulatory Visit (HOSPITAL_COMMUNITY)
Admission: RE | Admit: 2022-08-19 | Discharge: 2022-08-19 | Disposition: A | Discharge status: Home / Self Care | Payer: Medicaid Other | Source: Ambulatory Visit | Attending: Gastroenterology | Admitting: Gastroenterology

## 2022-08-19 ENCOUNTER — Other Ambulatory Visit: Payer: Self-pay | Admitting: Gastroenterology

## 2022-08-19 ENCOUNTER — Other Ambulatory Visit: Payer: Self-pay

## 2022-08-19 DIAGNOSIS — R188 Other ascites: Secondary | ICD-10-CM | POA: Insufficient documentation

## 2022-08-19 DIAGNOSIS — K269 Duodenal ulcer, unspecified as acute or chronic, without hemorrhage or perforation: Secondary | ICD-10-CM

## 2022-08-19 DIAGNOSIS — K7460 Unspecified cirrhosis of liver: Secondary | ICD-10-CM | POA: Insufficient documentation

## 2022-08-19 HISTORY — PX: IR PARACENTESIS: IMG2679

## 2022-08-19 LAB — BODY FLUID CELL COUNT WITH DIFFERENTIAL
Eos, Fluid: 0 %
Lymphs, Fluid: 81 %
Monocyte-Macrophage-Serous Fluid: 15 % — ABNORMAL LOW (ref 50–90)
Neutrophil Count, Fluid: 4 % (ref 0–25)
Total Nucleated Cell Count, Fluid: 373 cu mm (ref 0–1000)

## 2022-08-19 MED ORDER — LIDOCAINE HCL 1 % IJ SOLN
INTRAMUSCULAR | Status: AC
  Administered 2022-08-19: 10 mL
  Filled 2022-08-19: qty 20

## 2022-08-19 MED ORDER — ALBUMIN HUMAN 25 % IV SOLN: 12.5000 g | Freq: Once | INTRAVENOUS | Status: DC

## 2022-08-19 MED ORDER — ALBUMIN HUMAN 25 % IV SOLN: 50.0000 g | Freq: Once | INTRAVENOUS | Status: AC

## 2022-08-19 MED ORDER — ALBUMIN HUMAN 25 % IV SOLN
INTRAVENOUS | Status: AC
  Administered 2022-08-19: 50 g via INTRAVENOUS
  Filled 2022-08-19: qty 200

## 2022-08-19 NOTE — Procedures (Signed)
PROCEDURE SUMMARY:  Successful US guided paracentesis from RLQ.  Yielded 6.3L of ascitic fluid.  No immediate complications.  Pt tolerated well.   Specimen was sent for labs.  EBL < 72m  Sandi Towe PA-C 08/19/2022 1:23 PM

## 2022-08-22 ENCOUNTER — Other Ambulatory Visit: Payer: Self-pay

## 2022-08-22 DIAGNOSIS — K269 Duodenal ulcer, unspecified as acute or chronic, without hemorrhage or perforation: Secondary | ICD-10-CM

## 2022-08-22 MED ORDER — OMEPRAZOLE 20 MG PO CPDR
20.0000 mg | DELAYED_RELEASE_CAPSULE | Freq: Two times a day (BID) | ORAL | 3 refills | Status: DC
  Filled 2022-08-22: qty 60, 30d supply, fill #0

## 2022-08-23 LAB — PATHOLOGIST SMEAR REVIEW

## 2022-08-24 ENCOUNTER — Other Ambulatory Visit: Payer: Self-pay | Admitting: Diagnostic Radiology

## 2022-08-24 ENCOUNTER — Other Ambulatory Visit: Payer: Self-pay

## 2022-08-24 ENCOUNTER — Ambulatory Visit
Admission: RE | Admit: 2022-08-24 | Discharge: 2022-08-24 | Disposition: A | Discharge status: Home / Self Care | Payer: BC Managed Care – PPO | Source: Ambulatory Visit | Attending: Diagnostic Radiology | Admitting: Diagnostic Radiology

## 2022-08-24 DIAGNOSIS — R188 Other ascites: Secondary | ICD-10-CM

## 2022-08-24 DIAGNOSIS — K269 Duodenal ulcer, unspecified as acute or chronic, without hemorrhage or perforation: Secondary | ICD-10-CM

## 2022-08-24 DIAGNOSIS — R197 Diarrhea, unspecified: Secondary | ICD-10-CM

## 2022-08-24 DIAGNOSIS — R195 Other fecal abnormalities: Secondary | ICD-10-CM

## 2022-08-24 NOTE — Progress Notes (Signed)
Chief Complaint: Patient was consulted remotely today (TeleHealth) for follow-up of TIPS procedure  Referring Physician(s): Dustin Flock MD  History of Present Illness: Erik Howe is a 52 y.o. 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  Patient underwent TIPS procedure on 07/20/2022.  The procedure was technically successful and the patient was discharged the following day.  No immediate complications following procedure.  The patient has undergone 3 paracentesis procedures since the procedure, latest was on August 19, 2022.  6.3 L of fluid was removed during the last paracentesis.  Patient reports feeling cold since the procedure.  He denies abdominal pain except for the distention associated with the reaccumulation of the ascites.  His lower extremity swelling is unchanged.  He complains of difficulty concentrating which has progressed or worsened since the procedure.  He has a bowel movement once every 2 days and continues to use the lactulose.  He has occasional diarrhea.  He denies blood in his stools.  He denies vomiting. He is scheduled for follow-up colonoscopy and endoscopy tomorrow.  Past Medical History:  Diagnosis Date   Anxiety    Arthritis    Cellulitis 12/18/2012   left foot   Cirrhosis of liver ([place])    Depression    GERD (gastroesophageal reflux disease)    Hypertension    no longer a problem   Obesity    Varicose veins     Past Surgical History:  Procedure Laterality Date   CHOLECYSTECTOMY     ENDOVENOUS ABLATION SAPHENOUS VEIN W/ LASER Left 09-30-2013   left greater saphenous vein   IR IVUS EACH ADDITIONAL NON CORONARY VESSEL  07/20/2022   IR PARACENTESIS  05/03/2022   IR PARACENTESIS  05/18/2022   IR PARACENTESIS  06/02/2022   IR PARACENTESIS  06/10/2022   IR PARACENTESIS  06/24/2022   IR PARACENTESIS  06/30/2022   IR PARACENTESIS  07/08/2022   IR PARACENTESIS  07/15/2022   IR PARACENTESIS  07/20/2022    IR PARACENTESIS  07/29/2022   IR PARACENTESIS  08/12/2022   IR PARACENTESIS  08/19/2022   IR TIPS  07/20/2022   IR US GUIDE VASC ACCESS RIGHT  07/20/2022   IR US GUIDE VASC ACCESS RIGHT  07/20/2022   RADIOLOGY WITH ANESTHESIA N/A 07/20/2022   Procedure: TIPS;  Surgeon: Markus Daft, MD;  Location: [place];  Service: Radiology;  Laterality: N/A;    Allergies: Paxil [paroxetine], Tylenol [acetaminophen], and Ultram [tramadol]  Medications: Prior to Admission medications   Medication Sig Start Date End Date Taking? Authorizing Provider  albuterol (VENTOLIN HFA) 108 (90 Base) MCG/ACT inhaler Inhale 2 puffs into the lungs every 6 (six) hours as needed for shortness of breath 04/11/22     albuterol (VENTOLIN HFA) 108 (90 Base) MCG/ACT inhaler Inhale 2 puffs into the lungs every 6 (six) hours as neededshortness of breath . Patient not taking: Reported on 07/18/2022 04/11/22   Drue Flirt, MD  amitriptyline (ELAVIL) 10 MG tablet Take 1 Tablet by mouth nightly at bedtime 07/13/22     cyclobenzaprine (FLEXERIL) 10 MG tablet Take 1 Tablet by mouth nightly at bedtime as needed for muscle spasms Patient taking differently: Take 10 mg by mouth at bedtime. 12/15/21     diphenhydrAMINE (SOMINEX) 25 MG tablet Take 50 mg by mouth daily as needed for sleep or itching.    [provider]  docusate sodium (COLACE) 100 MG capsule Take 200 mg by mouth at bedtime.    [provider]  furosemide (LASIX) 80 MG tablet Take 1 tablet (80 mg total) by mouth in the morning AND 0.5 tablets (40 mg total) every evening. 06/17/22   Daryel November, MD  lactulose (CHRONULAC) 10 GM/15ML solution Take 45 mLs (30 g total) by mouth 3 (three) times daily. 07/21/22 10/19/22  Candiss Norse A, PA-C  melatonin 5 MG TABS Take 5 mg by mouth at bedtime as needed (Sleep).    [provider]  Na Sulfate-K Sulfate-Mg Sulf 17.5-3.13-1.6 GM/177ML SOLN Take as directed 06/27/22   Daryel November, MD  omeprazole  (PRILOSEC) 20 MG capsule Take 1 capsule (20 mg total) by mouth 2 (two) times daily before a meal. 08/22/22   Daryel November, MD  ondansetron (ZOFRAN-ODT) 8 MG disintegrating tablet Take 1 tablet (8 mg total) by mouth 3 (three) times daily as needed for nausea. 06/07/22     spironolactone (ALDACTONE) 100 MG tablet Take 1 tablet (100 mg total) by mouth 2 (two) times daily. 08/04/22   Daryel November, MD  triamcinolone ointment (KENALOG) 0.1 % apply a thin layer to the affected area(s) by topical route 2 times per day Patient not taking: Reported on 07/18/2022 09/17/21     Vitamin D, Ergocalciferol, (DRISDOL) 1.25 MG (50000 UNIT) CAPS capsule Take 1 capsule (50,000 Units total) by mouth every 7 (seven) days. 05/04/22   Daryel November, MD     Family History  Problem Relation Age of Onset   Varicose Veins Mother    Heart disease Mother    Diabetes Mother    Arthritis Mother    Hypertension Mother    Stroke Father    Heart disease Father    Colon cancer Neg Hx    Stomach cancer Neg Hx    Rectal cancer Neg Hx    Esophageal cancer Neg Hx     Social History   Socioeconomic History   Marital status: Single    Spouse name: Not on file   Number of children: Not on file   Years of education: Not on file   Highest education level: Not on file  Occupational History   Not on file  Tobacco Use   Smoking status: Some Days    Packs/day: 0.15    Types: Cigarettes   Smokeless tobacco: Never  Vaping Use   Vaping Use: Never used  Substance and Sexual Activity   Alcohol use: No   Drug use: No   Sexual activity: Not on file  Other Topics Concern   Not on file  Social History Narrative   Not on file   Social Determinants of Health   Financial Resource Strain: Not on file  Food Insecurity: Unknown (07/20/2022)   Hunger Vital Sign    Worried About Running Out of Food in the Last Year: Patient refused    Ran Out of Food in the Last Year: Patient refused  Transportation Needs:  Unknown (07/20/2022)   PRAPARE - Hydrologist (Medical): Patient refused    Lack of Transportation (Non-Medical): Patient refused  Physical Activity: Not on file  Stress: Not on file  Social Connections: Not on file      Review of Systems  Constitutional:  Positive for chills. Negative for activity change.  Respiratory: Negative.    Cardiovascular:  Positive for leg swelling.  Gastrointestinal:  Positive for abdominal distention and diarrhea. Negative for blood in stool and vomiting.    Physical Exam No direct physical exam was performed  Vital Signs: There were no vitals taken for this visit.  Imaging: IR Paracentesis  Result Date: 08/19/2022 INDICATION: Recurrent ascites, cirrhosis, 4 weeks s/p TIPS by Dr. Anselm Pancoast EXAM: ULTRASOUND GUIDED THERAPEUTIC PARACENTESIS MEDICATIONS: None. COMPLICATIONS: None immediate. PROCEDURE: Informed written consent was obtained from the patient after a discussion of the risks, benefits and alternatives to treatment. A timeout was performed prior to the initiation of the procedure. Initial ultrasound scanning demonstrates a Moderate amount of ascites within the right lower abdominal quadrant. The right lower abdomen was prepped and draped in the usual sterile fashion. 1% lidocaine was used for local anesthesia. Following this, a 19 gauge, 7-cm, Yueh catheter was introduced. An ultrasound image was saved for documentation purposes. The paracentesis was performed. The catheter was removed and a dressing was applied. The patient tolerated the procedure well without immediate post procedural complication. Patient received post-procedure intravenous albumin; see nursing notes for details. FINDINGS: A total of approximately 6.3L of ascitic fluid was removed. Samples were sent to the laboratory as requested by the clinical team. IMPRESSION: Successful ultrasound-guided therapeutic paracentesis yielding 6.3 L of peritoneal fluid. Performed  and Read by Pasty Spillers, PA-C Electronically Signed   By: Michaelle Birks M.D.   On: 08/19/2022 14:19   IR Paracentesis  Result Date: 08/12/2022 INDICATION: Patient with a history of cirrhosis with large volume ascites. Patient is status post TIPS July 20, 2022 EXAM: ULTRASOUND GUIDED PARACENTESIS MEDICATIONS: 1% lidocaine 10 mL COMPLICATIONS: None immediate. PROCEDURE: Informed written consent was obtained from the patient after a discussion of the risks, benefits and alternatives to treatment. A timeout was performed prior to the initiation of the procedure. Initial ultrasound scanning demonstrates a large amount of ascites within the left lower abdominal quadrant. The left lower abdomen was prepped and draped in the usual sterile fashion. 1% lidocaine was used for local anesthesia. Following this, a 19 gauge, 7-cm, Yueh catheter was introduced. An ultrasound image was saved for documentation purposes. The paracentesis was performed. The catheter was removed and a dressing was applied. The patient tolerated the procedure well without immediate post procedural complication. Patient received post-procedure intravenous albumin; see nursing notes for details. FINDINGS: A total of approximately 8.7 L of clear yellow fluid was removed. IMPRESSION: Successful ultrasound-guided paracentesis yielding 8.7 liters of peritoneal fluid. Read by: Soyla Dryer, NP PLAN: Patient underwent TIPS creation July 20, 2022 with Dr. Anselm Pancoast. Electronically Signed   By: Albin Felling M.D.   On: 08/12/2022 11:40   IR Paracentesis  Result Date: 07/29/2022 INDICATION: 52 year old male history of cirrhosis with recurrent ascites status post tips on July 21, 2022. Patient presents for therapeutic and diagnostic paracentesis EXAM: ULTRASOUND GUIDED THERAPEUTIC AND DIAGNOSTIC PARACENTESIS MEDICATIONS: Lidocaine 1% 10 mL COMPLICATIONS: None immediate. PROCEDURE: Informed written consent was obtained from the patient after a  discussion of the risks, benefits and alternatives to treatment. A timeout was performed prior to the initiation of the procedure. Initial ultrasound scanning demonstrates a moderate amount of ascites within the left lower abdominal quadrant. The left lower abdomen was prepped and draped in the usual sterile fashion. 1% lidocaine was used for local anesthesia. Following this, a 19 gauge, 7-cm, Yueh catheter was introduced. An ultrasound image was saved for documentation purposes. The paracentesis was performed. The catheter was removed and a dressing was applied. The patient tolerated the procedure well without immediate post procedural complication. FINDINGS: A total of approximately 2.8 L of amber colored fluid was removed. Samples were sent to the laboratory as requested by  the clinical team. IMPRESSION: Successful ultrasound-guided therapeutic and diagnostic paracentesis yielding 2.8 liters of peritoneal fluid. Read by: Rushie Nyhan, NP Electronically Signed   By: Corrie Mckusick D.O.   On: 07/29/2022 11:54    Labs:  CBC: Recent Labs    04/12/22 1805 07/07/22 1354 07/20/22 0942 07/21/22 0411  WBC 7.4 7.7 8.1 17.3*  HGB 12.7* 11.9* 11.3* 10.0*  HCT 37.7* 33.3* 34.3* 28.5*  PLT 199 272 215 171    COAGS: Recent Labs    04/12/22 1805 07/07/22 1354 07/20/22 0942 07/21/22 0411  INR 1.5* 1.2* 1.3* 1.5*  APTT 37*  --   --   --     BMP: Recent Labs    03/30/22 1031 04/12/22 1805 05/02/22 1441 05/20/22 1057 07/07/22 1354 07/20/22 0942 07/21/22 0411  NA 135 132*   < > 130* 129* 130* 127*  K 3.4* 4.2   < > 4.7 4.2 4.6 5.1  CL 105 102   < > 97 95* 100 96*  CO2 26 25   < > 27 25 21* 23  GLUCOSE 93 102*   < > 116* 88 71 107*  BUN 8 11   < > '11 12 14 10  '$ CALCIUM 8.2* 7.9*   < > 8.0* 8.5* 8.4* 8.5*  CREATININE 0.78 0.87   < > 1.00 1.11 1.29* 1.00  GFRNONAA >60 >60  --   --   --  >60 >60   < > = values in this interval not displayed.    LIVER FUNCTION TESTS: Recent Labs     04/12/22 1805 05/02/22 1441 07/07/22 1354 07/20/22 0942 07/21/22 0411  BILITOT 2.0* 1.5* 1.3* 1.6* 1.5*  AST 61* 41* 31 38 95*  ALT 32 '26 18 22 '$ 37  ALKPHOS 190* 169*  --  74 95  PROT 6.0* 6.8 6.0* 5.9* 5.4*  ALBUMIN 2.5* 2.9*  --  3.0* 2.7*    TUMOR MARKERS: Recent Labs    05/02/22 1441  AFPTM 4.9    Assessment and Plan:  52 year old with cirrhosis and refractory ascites requiring large-volume paracentesis.  Patient underwent a successful TIPS procedure on 07/20/2022.  Patient continues to have large-volume refractory ascites and has had 3 paracentesis procedures since the procedure.  Patient complains of feeling cold and difficulty concentrating since the procedure.  Not clear why the patient is feeling cold all of the time.  Explained to the patient that mental confusion can be associated with post TIPS encephalopathy and we will need to follow this and possibly manage with medications.  The portosystemic gradient was 8 mmHg following the TIPS procedure and he may need additional dilatation of the TIPS stent if he continues to have large-volume ascites.  I would like patient to follow-up with Dr. Candis Schatz to make sure that the patient's diuretic management is optimized and to address the concentration issues.  We will schedule the patient for a liver duplex ultrasound to confirm that the TIPS stent is patent.  Plan for follow-up visit in 3 months.   Electronically Signed: Burman Riis 08/24/2022, 10:09 AM   I spent a total of    10 Minutes in remote  clinical consultation, greater than 50% of which was counseling/coordinating care for TIPS management.  Total time includes review of chart and imaging.    Visit type: Audio only (telephone). Audio (no video) only due to patient preference and technical limitations. Alternative for in-person consultation at [facility], [address], [place], [place]. This visit type was conducted  due to national recommendations for  restrictions regarding the COVID-19 Pandemic (e.g. social distancing).  This format is felt to be most appropriate for this patient at this time.  All issues noted in this document were discussed and addressed.  Patient ID: Erik Howe, male   DOB: 03-19-1971, 52 y.o.   MRN: [id number]

## 2022-08-25 ENCOUNTER — Encounter (HOSPITAL_COMMUNITY): Payer: Self-pay | Admitting: Gastroenterology

## 2022-08-25 ENCOUNTER — Other Ambulatory Visit: Payer: Self-pay

## 2022-08-25 ENCOUNTER — Ambulatory Visit (HOSPITAL_COMMUNITY): Payer: Medicaid Other | Admitting: Anesthesiology

## 2022-08-25 ENCOUNTER — Ambulatory Visit (HOSPITAL_BASED_OUTPATIENT_CLINIC_OR_DEPARTMENT_OTHER): Payer: Medicaid Other | Admitting: Anesthesiology

## 2022-08-25 ENCOUNTER — Encounter (HOSPITAL_COMMUNITY)
Admission: RE | Disposition: A | Discharge status: Home / Self Care | Payer: Medicaid Other | Source: Home / Self Care | Attending: Gastroenterology

## 2022-08-25 ENCOUNTER — Ambulatory Visit (HOSPITAL_COMMUNITY)
Admission: RE | Admit: 2022-08-25 | Discharge: 2022-08-25 | Disposition: A | Discharge status: Home / Self Care | Payer: Medicaid Other | Attending: Gastroenterology | Admitting: Gastroenterology

## 2022-08-25 ENCOUNTER — Telehealth: Payer: Self-pay

## 2022-08-25 DIAGNOSIS — K766 Portal hypertension: Secondary | ICD-10-CM | POA: Diagnosis not present

## 2022-08-25 DIAGNOSIS — I8510 Secondary esophageal varices without bleeding: Secondary | ICD-10-CM | POA: Insufficient documentation

## 2022-08-25 DIAGNOSIS — K7469 Other cirrhosis of liver: Secondary | ICD-10-CM

## 2022-08-25 DIAGNOSIS — D122 Benign neoplasm of ascending colon: Secondary | ICD-10-CM

## 2022-08-25 DIAGNOSIS — F32A Depression, unspecified: Secondary | ICD-10-CM | POA: Insufficient documentation

## 2022-08-25 DIAGNOSIS — K2289 Other specified disease of esophagus: Secondary | ICD-10-CM

## 2022-08-25 DIAGNOSIS — D125 Benign neoplasm of sigmoid colon: Secondary | ICD-10-CM

## 2022-08-25 DIAGNOSIS — K2100 Gastro-esophageal reflux disease with esophagitis, without bleeding: Secondary | ICD-10-CM | POA: Diagnosis not present

## 2022-08-25 DIAGNOSIS — K449 Diaphragmatic hernia without obstruction or gangrene: Secondary | ICD-10-CM

## 2022-08-25 DIAGNOSIS — K7682 Hepatic encephalopathy: Secondary | ICD-10-CM

## 2022-08-25 DIAGNOSIS — Z6830 Body mass index (BMI) 30.0-30.9, adult: Secondary | ICD-10-CM | POA: Diagnosis not present

## 2022-08-25 DIAGNOSIS — Z79899 Other long term (current) drug therapy: Secondary | ICD-10-CM | POA: Diagnosis not present

## 2022-08-25 DIAGNOSIS — I10 Essential (primary) hypertension: Secondary | ICD-10-CM | POA: Insufficient documentation

## 2022-08-25 DIAGNOSIS — K7460 Unspecified cirrhosis of liver: Secondary | ICD-10-CM

## 2022-08-25 DIAGNOSIS — X58XXXA Exposure to other specified factors, initial encounter: Secondary | ICD-10-CM | POA: Insufficient documentation

## 2022-08-25 DIAGNOSIS — F419 Anxiety disorder, unspecified: Secondary | ICD-10-CM | POA: Insufficient documentation

## 2022-08-25 DIAGNOSIS — D124 Benign neoplasm of descending colon: Secondary | ICD-10-CM

## 2022-08-25 DIAGNOSIS — D123 Benign neoplasm of transverse colon: Secondary | ICD-10-CM | POA: Diagnosis not present

## 2022-08-25 DIAGNOSIS — F17210 Nicotine dependence, cigarettes, uncomplicated: Secondary | ICD-10-CM | POA: Insufficient documentation

## 2022-08-25 DIAGNOSIS — R197 Diarrhea, unspecified: Secondary | ICD-10-CM

## 2022-08-25 DIAGNOSIS — K269 Duodenal ulcer, unspecified as acute or chronic, without hemorrhage or perforation: Secondary | ICD-10-CM | POA: Diagnosis not present

## 2022-08-25 DIAGNOSIS — T185XXA Foreign body in anus and rectum, initial encounter: Secondary | ICD-10-CM | POA: Diagnosis not present

## 2022-08-25 DIAGNOSIS — Z1211 Encounter for screening for malignant neoplasm of colon: Secondary | ICD-10-CM

## 2022-08-25 DIAGNOSIS — E669 Obesity, unspecified: Secondary | ICD-10-CM | POA: Insufficient documentation

## 2022-08-25 DIAGNOSIS — K3189 Other diseases of stomach and duodenum: Secondary | ICD-10-CM

## 2022-08-25 DIAGNOSIS — K5520 Angiodysplasia of colon without hemorrhage: Secondary | ICD-10-CM | POA: Diagnosis not present

## 2022-08-25 DIAGNOSIS — R195 Other fecal abnormalities: Secondary | ICD-10-CM

## 2022-08-25 DIAGNOSIS — I8500 Esophageal varices without bleeding: Secondary | ICD-10-CM

## 2022-08-25 HISTORY — PX: COLONOSCOPY WITH PROPOFOL: SHX5780

## 2022-08-25 HISTORY — PX: HOT HEMOSTASIS: SHX5433

## 2022-08-25 HISTORY — PX: BIOPSY: SHX5522

## 2022-08-25 HISTORY — PX: POLYPECTOMY: SHX5525

## 2022-08-25 HISTORY — PX: ESOPHAGOGASTRODUODENOSCOPY (EGD) WITH PROPOFOL: SHX5813

## 2022-08-25 SURGERY — COLONOSCOPY WITH PROPOFOL
Anesthesia: Monitor Anesthesia Care

## 2022-08-25 MED ORDER — FUROSEMIDE 80 MG PO TABS
ORAL_TABLET | ORAL | 3 refills | Status: DC
  Filled 2022-08-25: qty 90, 36d supply, fill #0
  Filled 2022-11-16: qty 90, 36d supply, fill #1
  Filled 2023-04-04: qty 90, 36d supply, fill #2

## 2022-08-25 MED ORDER — OMEPRAZOLE 20 MG PO CPDR
20.0000 mg | DELAYED_RELEASE_CAPSULE | Freq: Every day | ORAL | 3 refills | Status: DC
  Filled 2022-08-25: qty 90, 90d supply, fill #0
  Filled 2022-08-25: qty 120, 120d supply, fill #0
  Filled 2022-09-01: qty 90, 90d supply, fill #0
  Filled 2022-12-07 – 2022-12-20 (×2): qty 90, 90d supply, fill #1
  Filled 2023-04-04: qty 90, 90d supply, fill #2

## 2022-08-25 MED ORDER — SODIUM CHLORIDE 0.9 % IV SOLN: INTRAVENOUS | Status: DC

## 2022-08-25 MED ORDER — RIFAXIMIN 550 MG PO TABS
550.0000 mg | ORAL_TABLET | Freq: Two times a day (BID) | ORAL | 3 refills | Status: DC
  Filled 2022-08-25: qty 56, 28d supply, fill #0

## 2022-08-25 MED ORDER — LACTATED RINGERS IV SOLN: INTRAVENOUS | Status: DC

## 2022-08-25 MED ORDER — LACTULOSE 10 GM/15ML PO SOLN
30.0000 g | Freq: Four times a day (QID) | ORAL | 2 refills | Status: AC
  Filled 2022-08-25 – 2023-06-06 (×3): qty 3784, 21d supply, fill #0
  Filled 2023-06-06: qty 3784, 21d supply, fill #1
  Filled 2023-07-28: qty 3784, 21d supply, fill #0

## 2022-08-25 MED ORDER — PROPOFOL 1000 MG/100ML IV EMUL
INTRAVENOUS | Status: AC
  Filled 2022-08-25: qty 100

## 2022-08-25 MED ORDER — LIDOCAINE 2% (20 MG/ML) 5 ML SYRINGE
INTRAMUSCULAR | Status: DC | PRN
  Administered 2022-08-25: 100 mg via INTRAVENOUS

## 2022-08-25 MED ORDER — PROPOFOL 500 MG/50ML IV EMUL
INTRAVENOUS | Status: DC | PRN
  Administered 2022-08-25: 130 ug/kg/min via INTRAVENOUS

## 2022-08-25 MED ORDER — PROPOFOL 10 MG/ML IV BOLUS
INTRAVENOUS | Status: DC | PRN
  Administered 2022-08-25 (×4): 20 mg via INTRAVENOUS

## 2022-08-25 SURGICAL SUPPLY — 25 items

## 2022-08-25 NOTE — Op Note (Signed)
[facility] Patient Name: Erik Howe Procedure Date: 08/25/2022 MRN: [id number] Attending MD: Gladstone Pih. Candis Schatz , MD, [id number] Date of Birth: 1970/07/29 CSN: [id number] Age: 52 Admit Type: Outpatient Procedure:                Upper GI endoscopy Indications:              Follow-up of reflux esophagitis, Follow-up of                            duodenal ulceration Providers:                Harbor Paster [name], MD, Fanny Skates RN, RN,                            Luan Moore, Technician, [place] CRNA,                            CRNA Referring MD:              Medicines:                Monitored Anesthesia Care Complications:            No immediate complications. Estimated Blood Loss:     Estimated blood loss was minimal. Procedure:                Pre-Anesthesia Assessment:                           - Prior to the procedure, a History and Physical                            was performed, and patient medications and                            allergies were reviewed. The patient's tolerance of                            previous anesthesia was also reviewed. The risks                            and benefits of the procedure and the sedation                            options and risks were discussed with the patient.                            All questions were answered, and informed consent                            was obtained. Prior Anticoagulants: The patient has                            taken no anticoagulant or antiplatelet agents. ASA  Grade Assessment: IV - A patient with severe                            systemic disease that is a constant threat to life.                            After reviewing the risks and benefits, the patient                            was deemed in satisfactory condition to undergo the                            procedure.                           After obtaining informed consent, the  endoscope was                            passed under direct vision. Throughout the                            procedure, the patient's blood pressure, pulse, and                            oxygen saturations were monitored continuously. The                            [id number] Olympus endoscope was introduced                            through the mouth, and advanced to the third part                            of duodenum. The upper GI endoscopy was                            accomplished without difficulty. The patient                            tolerated the procedure well. Scope In: Scope Out: Findings:      The examined portions of the nasopharynx, oropharynx and larynx were       normal.      The Z-line was irregular.      Grade I varices were found in the lower third of the esophagus. They       were small in size.      The exam of the esophagus was otherwise normal.      A 5 cm hiatal hernia was present.      Mild, diffuse portal hypertensive gastropathy was found in the gastric       fundus and in the gastric body. Biopsies were taken with a cold forceps       for Helicobacter pylori testing. Estimated blood loss was minimal.      The exam of the stomach was otherwise normal.      Localized granular  mucosa was found in the second portion of the       duodenum. Biopsies were taken with a cold forceps for histology.       Estimated blood loss was minimal.      A scar was found in the duodenal bulb, in the second portion of the       duodenum and in the third portion of the duodenum.      The exam of the duodenum was otherwise normal. Impression:               - The examined portions of the nasopharynx,                            oropharynx and larynx were normal.                           - Z-line irregular. The previously noted                            esophagitis has healed.                           - Grade I esophageal varices. These do no require                             banding or beta blocker prophylaxis                           - 5 cm hiatal hernia.                           - Portal hypertensive gastropathy. Biopsied.                           - Focal granular mucosa in the second portion of                            the duodenum. Likely healing inflammation. Biopsied                            toexclude dysplasia                           - Duodenal scarring from previous ulceration. Moderate Sedation:      Not Applicable - Patient had care per Anesthesia. Recommendation:           - Patient has a contact number available for                            emergencies. The signs and symptoms of potential                            delayed complications were discussed with the                            patient. Return to normal activities tomorrow.  Written discharge instructions were provided to the                            patient.                           - Resume previous diet.                           - Continue present medications.                           - Await pathology results.                           - Ok to decrease omeprazole to once daily. Would                            continue this indefinitely.                           - Repeat upper endoscopy in 1 year for variceal                            surveillance. Procedure Code(s):        --- Professional ---                           [phone], Esophagogastroduodenoscopy, flexible,                            transoral; with biopsy, single or multiple Diagnosis Code(s):        --- Professional ---                           K22.89, Other specified disease of esophagus                           I85.00, Esophageal varices without bleeding                           K44.9, Diaphragmatic hernia without obstruction or                            gangrene                           K76.6, Portal hypertension                           K31.89, Other  diseases of stomach and duodenum                           K21.00, Gastro-esophageal reflux disease with                            esophagitis, without bleeding  K26.9, Duodenal ulcer, unspecified as acute or                            chronic, without hemorrhage or perforation CPT copyright 2022 American [employer] Association. All rights reserved. The codes documented in this report are preliminary and upon coder review may  be revised to meet current compliance requirements. Joushua Dugar [name], MD 08/25/2022 9:38:15 AM This report has been signed electronically. Number of Addenda: 0

## 2022-08-25 NOTE — Op Note (Signed)
[facility] Patient Name: Erik Howe Procedure Date: 08/25/2022 MRN: [id number] Attending MD: Gladstone Pih. Candis Schatz , MD, [id number] Date of Birth: Apr 09, 1971 CSN: [id number] Age: 52 Admit Type: Outpatient Procedure:                Colonoscopy Indications:              Screening for colorectal malignant neoplasm, last                            colonoscopy incomplete (more recent than 10 years                            ago). Patient under his initial colonoscopy                            following a positive Cologuard in Dec 2023. That                            colonoscopy was incomplete (cecum not visualized)                            due to excessive looping. 13 tubular adenomas were                            removed, and the bowel prep was of fair quality. At                            that time, the patient had large volume ascites. 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 He underwent a therapeutic                            paracentesis 6 days ago with 6 liters removed, but                            still has abdominal distention from ascites today Providers:                Nicki Reaper [name], MD, Fanny Skates RN, RN,                            Luan Moore, Technician, [place],  CRNA Referring MD:              Medicines:                Monitored Anesthesia Care Complications:            No immediate complications. Estimated Blood Loss:     Estimated blood loss was minimal. Procedure:                Pre-Anesthesia Assessment:                           - Prior to the procedure, a History and Physical                            was performed, and patient medications and                            allergies were  reviewed. The patient's tolerance of                            previous anesthesia was also reviewed. The risks                            and benefits of the procedure and the sedation                            options and risks were discussed with the patient.                            All questions were answered, and informed consent                            was obtained. Prior Anticoagulants: The patient has                            taken no anticoagulant or antiplatelet agents. ASA                            Grade Assessment: IV - A patient with severe                            systemic disease that is a constant threat to life.                            After reviewing the risks and benefits, the patient                            was deemed in satisfactory condition to undergo the                            procedure.                           After obtaining informed consent, the colonoscope  was passed under direct vision. Throughout the                            procedure, the patient's blood pressure, pulse, and                            oxygen saturations were monitored continuously. The                            [id number]) Olympus colonoscope was                            introduced through the anus and advanced to the the                            cecum, identified by the ileocecal valve. The                            colonoscopy was unusually difficult due to a                            redundant colon and significant looping. Successful                            completion of the procedure was aided by changing                            the patient to a supine position, changing the                            patient to a prone position, using manual pressure                            and withdrawing and reinserting the scope. The                            patient tolerated the procedure well. The quality                             of the bowel preparation was good. The ileocecal                            valve and the rectum were photographed. Scope In: 8:20:38 AM Scope Out: 9:26:48 AM Scope Withdrawal Time: 0 hours 31 minutes 4 seconds  Total Procedure Duration: 1 hour 6 minutes 10 seconds  Findings:      The perianal and digital rectal examinations were normal. Pertinent       negatives include normal sphincter tone and no palpable rectal lesions.      Three sessile polyps were found in the ascending colon. The polyps were       3 to 6 mm in size. These polyps were removed with a cold snare.       Resection and retrieval were complete. Estimated blood loss was minimal.  A 2 mm polyp was found in the ascending colon. The polyp was sessile.       The polyp was removed with a jumbo cold forceps. Resection and retrieval       were complete. Estimated blood loss was minimal.      A 6 mm polyp was found in the transverse colon. The polyp was sessile.       The polyp was removed with a cold snare. Resection and retrieval were       complete. Estimated blood loss was minimal.      A 3 mm polyp was found in the descending colon. The polyp was sessile.       The polyp was removed with a cold snare. Resection and retrieval were       complete. Estimated blood loss was minimal.      A 5 mm polyp was found in the sigmoid colon. The polyp was sessile. The       polyp was removed with a cold snare. Resection and retrieval were       complete. Estimated blood loss was minimal.      A foreign body (hemoclip) was found in the rectum. The mucosa around the       hemoclip appeared adenomatous. Biopsies were taken with a cold forceps       for histology. Estimated blood loss was minimal.      A single small angiodysplastic lesion with bleeding on contact was found       in the ascending colon. Coagulation for tissue destruction using argon       plasma was successful. Estimated blood loss: none.      The exam was  otherwise normal throughout the examined colon.      The retroflexed view of the distal rectum and anal verge was normal and       showed no anal or rectal abnormalities. Impression:               - Three 3 to 6 mm polyps in the ascending colon,                            removed with a cold snare. Resected and retrieved.                           - One 2 mm polyp in the ascending colon, removed                            with a jumbo cold forceps. Resected and retrieved.                           - One 6 mm polyp in the transverse colon, removed                            with a cold snare. Resected and retrieved.                           - One 3 mm polyp in the descending colon, removed                            with a cold snare. Resected and  retrieved.                           - One 5 mm polyp in the sigmoid colon, removed with                            a cold snare. Resected and retrieved.                           - Hemoclip from previous polypectomy in the rectum                            with mucosa suspicious for remnant adenoma,                            biopsied.                           - A single colonic angiodysplastic lesion. Treated                            with argon plasma coagulation (APC).                           - The distal rectum and anal verge are normal on                            retroflexion view.                           - Not all of the cecum was visualized and the                            appendiceal orifice was never definitively                            identified. The patient's refractory ascites is                            likely contributing to the technical difficulty of                            the procedure. Moderate Sedation:      Not Applicable - Patient had care per Anesthesia. Recommendation:           - Patient has a contact number available for                            emergencies. The signs and symptoms of potential                             delayed complications were discussed with the                            patient. Return to normal activities tomorrow.  Written discharge instructions were provided to the                            patient.                           - Resume previous diet.                           - Continue present medications.                           - Await pathology results.                           - Repeat colonoscopy in 9-12 months for                            surveillance. Hopefully the ascites will be better                            controlled at that time, and the cecum will be                            completely visualized.                           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                           - Increase lactulose to four times a day                           - Follow up in the office in 2-3 weeks Procedure Code(s):        --- Professional ---                           [id number], 59, Colonoscopy, flexible; with control of  bleeding, any method                           45385, Colonoscopy, flexible; with removal of                            tumor(s), polyp(s), or other lesion(s) by snare                            technique                           45380, 56, Colonoscopy, flexible; with biopsy,                            single or multiple Diagnosis Code(s):        --- Professional ---                           Z12.11, Encounter for screening for malignant                            neoplasm of colon                            D12.2, Benign neoplasm of ascending colon                           D12.3, Benign neoplasm of transverse colon (hepatic                            flexure or splenic flexure)                           D12.4, Benign neoplasm of descending colon                           D12.5, Benign neoplasm of sigmoid colon                           T18.5XXA, Foreign body in anus and rectum, initial                            encounter                           K55.20, Angiodysplasia of colon without hemorrhage CPT copyright 2022 [employer]. All rights reserved. The codes documented in this report are preliminary and upon coder review may  be revised to meet current compliance requirements. Tamaira Ciriello [name], MD 08/25/2022 10:00:37 AM This report has been signed electronically. Number of Addenda: 0

## 2022-08-25 NOTE — Anesthesia Postprocedure Evaluation (Signed)
Anesthesia Post Note  Patient: Erik Howe  Procedure(s) Performed: COLONOSCOPY WITH PROPOFOL ESOPHAGOGASTRODUODENOSCOPY (EGD) WITH PROPOFOL BIOPSY POLYPECTOMY HOT HEMOSTASIS (ARGON PLASMA COAGULATION/BICAP)     Patient location during evaluation: PACU Anesthesia Type: MAC Level of consciousness: awake and alert Pain management: pain level controlled Vital Signs Assessment: post-procedure vital signs reviewed and stable Respiratory status: spontaneous breathing, nonlabored ventilation, respiratory function stable and patient connected to nasal cannula oxygen Cardiovascular status: stable and blood pressure returned to baseline Postop Assessment: no apparent nausea or vomiting Anesthetic complications: no  No notable events documented.  Last Vitals:  Vitals:   08/25/22 0934 08/25/22 0935  BP: (!) 60/34 99/63  Pulse: 85   Resp: 17 (!) 24  Temp:    SpO2: 98%     Last Pain:  Vitals:   08/25/22 0934  TempSrc:   PainSc: 0-No pain                 Haru Anspaugh S

## 2022-08-25 NOTE — Discharge Instructions (Signed)
YOU HAD AN ENDOSCOPIC PROCEDURE TODAY: Refer to the procedure report and other information in the discharge instructions given to you for any specific questions about what was found during the examination. If this information does not answer your questions, please call [place] office at [phone] to clarify.  ° °YOU SHOULD EXPECT: Some feelings of bloating in the abdomen. Passage of more gas than usual. Walking can help get rid of the air that was put into your GI tract during the procedure and reduce the bloating. If you had a lower endoscopy (such as a colonoscopy or flexible sigmoidoscopy) you may notice spotting of blood in your stool or on the toilet paper. Some abdominal soreness may be present for a day or two, also. ° °DIET: Your first meal following the procedure should be a light meal and then it is ok to progress to your normal diet. A half-sandwich or bowl of soup is an example of a good first meal. Heavy or fried foods are harder to digest and may make you feel nauseous or bloated. Drink plenty of fluids but you should avoid alcoholic beverages for 24 hours. If you had a esophageal dilation, please see attached instructions for diet.   ° °ACTIVITY: Your care partner should take you home directly after the procedure. You should plan to take it easy, moving slowly for the rest of the day. You can resume normal activity the day after the procedure however YOU SHOULD NOT DRIVE, use power tools, machinery or perform tasks that involve climbing or major physical exertion for 24 hours (because of the sedation medicines used during the test).  ° °SYMPTOMS TO REPORT IMMEDIATELY: °A gastroenterologist can be reached at any hour. Please call [phone]  for any of the following symptoms:  °Following lower endoscopy (colonoscopy, flexible sigmoidoscopy) °Excessive amounts of blood in the stool  °Significant tenderness, worsening of abdominal pains  °Swelling of the abdomen that is new, acute  °Fever of 100° or  higher  °Following upper endoscopy (EGD, EUS, ERCP, esophageal dilation) °Vomiting of blood or coffee ground material  °New, significant abdominal pain  °New, significant chest pain or pain under the shoulder blades  °Painful or persistently difficult swallowing  °New shortness of breath  °Black, tarry-looking or red, bloody stools ° °FOLLOW UP:  °If any biopsies were taken you will be contacted by phone or by letter within the next 1-3 weeks. Call [phone]  if you have not heard about the biopsies in 3 weeks.  °Please also call with any specific questions about appointments or follow up tests. ° °

## 2022-08-25 NOTE — Telephone Encounter (Signed)
Lab orders in epic. Script sent to pharmacy.

## 2022-08-25 NOTE — Anesthesia Preprocedure Evaluation (Signed)
Anesthesia Evaluation  Patient identified by MRN, date of birth, ID band Patient awake  General Assessment Comment:Expand All Collapse All          Chief Complaint: Patient was consulted remotely today (TeleHealth) for follow-up of TIPS procedure   Referring Physician(s): Dustin Flock MD   History of Present Illness: Erik Howe is a 52 y.o. 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  Patient underwent TIPS procedure on 07/20/2022.  The procedure was technically successful and the patient was discharged the following day.  No immediate complications following procedure.  The patient has undergone 3 paracentesis procedures since the procedure, latest was on August 19, 2022.  6.3 L of fluid was removed during the last paracentesis     Reviewed: Allergy & Precautions, H&P , NPO status , Patient's Chart, lab work & pertinent test results  Airway Mallampati: II  TM Distance: >3 FB Neck ROM: Full    Dental no notable dental hx.    Pulmonary Current Smoker and Patient abstained from smoking.   Pulmonary exam normal breath sounds clear to auscultation       Cardiovascular hypertension, Normal cardiovascular exam Rhythm:Regular Rate:Normal     Neuro/Psych   Anxiety     negative neurological ROS     GI/Hepatic ,GERD  ,,(+) Cirrhosis   ascites      Endo/Other    Morbid obesity  Renal/GU negative Renal ROS  negative genitourinary   Musculoskeletal negative musculoskeletal ROS (+)    Abdominal   Peds negative pediatric ROS (+)  Hematology negative hematology ROS (+)   Anesthesia Other Findings   Reproductive/Obstetrics negative OB ROS                             Anesthesia Physical Anesthesia Plan  ASA: 4  Anesthesia Plan: MAC   Post-op Pain Management: Minimal or no pain anticipated   Induction: Intravenous  PONV Risk Score and  Plan: 1 and Treatment may vary due to age or medical condition  Airway Management Planned: Simple Face Mask  Additional Equipment:   Intra-op Plan:   Post-operative Plan:   Informed Consent: I have reviewed the patients History and Physical, chart, labs and discussed the procedure including the risks, benefits and alternatives for the proposed anesthesia with the patient or authorized representative who has indicated his/her understanding and acceptance.     Dental advisory given  Plan Discussed with: CRNA and Surgeon  Anesthesia Plan Comments:        Anesthesia Quick Evaluation

## 2022-08-25 NOTE — Transfer of Care (Signed)
Immediate Anesthesia Transfer of Care Note  Patient: SLOAN CHERVENAK  Procedure(s) Performed: COLONOSCOPY WITH PROPOFOL ESOPHAGOGASTRODUODENOSCOPY (EGD) WITH PROPOFOL BIOPSY POLYPECTOMY HOT HEMOSTASIS (ARGON PLASMA COAGULATION/BICAP)  Patient Location: PACU  Anesthesia Type:MAC  Level of Consciousness: sedated  Airway & Oxygen Therapy: Patient Spontanous Breathing and Patient connected to face mask oxygen  Post-op Assessment: Report given to RN and Post -op Vital signs reviewed and stable  Post vital signs: Reviewed and stable  Last Vitals:  Vitals Value Taken Time  BP    Temp    Pulse    Resp 7 08/25/22 0933  SpO2    Vitals shown include unvalidated device data.  Last Pain:  Vitals:   08/25/22 0725  TempSrc: Temporal  PainSc: 0-No pain         Complications: No notable events documented.

## 2022-08-25 NOTE — H&P (Signed)
[facility] History and Physical   Primary Care Physician:  Jorge Ny, PA-C   Reason for Procedure:   Follow up esophagitis, duodenal ulcers, repeat colonoscopy  Plan:    EGD, colonoscopy     HPI: Erik Howe is a 52 y.o. male with decompensated MASLD cirrhosis undergoing repeat EGD and colonoscopy.  He underwent EGD and colonoscopy Dec 29th and his EGD was notable for LA Grade D esophagitis and severe, diffuse duodenal ulceration with stenosis.  He was started on twice daily omeprazole and advised to stop all NSAIDs.   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  The bowel prep was also of fair quality.  He has no family history of colon cancer.   Past Medical History:  Diagnosis Date   Anxiety    Arthritis    Cellulitis 12/18/2012   left foot   Cirrhosis of liver ([place])    Depression    GERD (gastroesophageal reflux disease)    Hypertension    no longer a problem   Obesity    Varicose veins     Past Surgical History:  Procedure Laterality Date   CHOLECYSTECTOMY     ENDOVENOUS ABLATION SAPHENOUS VEIN W/ LASER Left 09-30-2013   left greater saphenous vein   IR IVUS EACH ADDITIONAL NON CORONARY VESSEL  07/20/2022   IR PARACENTESIS  05/03/2022   IR PARACENTESIS  05/18/2022   IR PARACENTESIS  06/02/2022   IR PARACENTESIS  06/10/2022   IR PARACENTESIS  06/24/2022   IR PARACENTESIS  06/30/2022   IR PARACENTESIS  07/08/2022   IR PARACENTESIS  07/15/2022   IR PARACENTESIS  07/20/2022   IR PARACENTESIS  07/29/2022   IR PARACENTESIS  08/12/2022   IR PARACENTESIS  08/19/2022   IR TIPS  07/20/2022   IR US GUIDE VASC ACCESS RIGHT  07/20/2022   IR US GUIDE VASC ACCESS RIGHT  07/20/2022   RADIOLOGY WITH ANESTHESIA N/A 07/20/2022   Procedure: TIPS;  Surgeon: Markus Daft, MD;  Location: [place];  Service: Radiology;  Laterality: N/A;    Prior to Admission medications    Medication Sig Start Date End Date Taking? Authorizing Provider  albuterol (VENTOLIN HFA) 108 (90 Base) MCG/ACT inhaler Inhale 2 puffs into the lungs every 6 (six) hours as needed for shortness of breath 04/11/22  Yes   albuterol (VENTOLIN HFA) 108 (90 Base) MCG/ACT inhaler Inhale 2 puffs into the lungs every 6 (six) hours as neededshortness of breath . 04/11/22  Yes Drue Flirt, MD  amitriptyline (ELAVIL) 10 MG tablet Take 1 Tablet by mouth nightly at bedtime 07/13/22  Yes   cyclobenzaprine (FLEXERIL) 10 MG tablet Take 1 Tablet by mouth nightly at bedtime as needed for muscle spasms Patient taking differently: Take 10 mg by mouth at bedtime. 12/15/21  Yes   diphenhydrAMINE (SOMINEX) 25 MG tablet Take 50 mg by mouth daily as needed for sleep or itching.   Yes [provider]  docusate sodium (COLACE) 100 MG capsule Take 200 mg by mouth daily as needed (constipation.).   Yes [provider]  furosemide (LASIX) 80 MG tablet Take 1 tablet (80 mg total) by mouth in the morning AND 0.5 tablets (40 mg total) every evening. 06/17/22  Yes Daryel November, MD  lactulose (CHRONULAC) 10 GM/15ML solution Take 45 mLs (30 g total) by mouth 3 (three) times daily. 07/21/22 10/19/22 Yes Joaquim Nam, PA-C  melatonin 5 MG TABS Take 5 mg by mouth at bedtime as needed (Sleep).   Yes [provider]  omeprazole (PRILOSEC) 20 MG capsule Take 1 capsule (20 mg total) by mouth 2 (two) times daily before a meal. 08/22/22  Yes Daryel November, MD  ondansetron (ZOFRAN-ODT) 8 MG disintegrating tablet Take 1 tablet (8 mg total) by mouth 3 (three) times daily as needed for nausea. 06/07/22  Yes   spironolactone (ALDACTONE) 100 MG tablet Take 1 tablet (100 mg total) by mouth 2 (two) times daily. 08/04/22  Yes Daryel November, MD  Na Sulfate-K Sulfate-Mg Sulf 17.5-3.13-1.6 GM/177ML SOLN Take as directed 06/27/22   Daryel November, MD  triamcinolone ointment (KENALOG) 0.1 % apply a thin  layer to the affected area(s) by topical route 2 times per day Patient not taking: Reported on 07/18/2022 09/17/21       Current Facility-Administered Medications  Medication Dose Route Frequency Provider Last Rate Last Admin   0.9 %  sodium chloride infusion   Intravenous Continuous Daryel November, MD       lactated ringers infusion   Intravenous Continuous Daryel November, MD 10 mL/hr at 08/25/22 0736 New Bag at 08/25/22 0736    Allergies as of 06/23/2022 - Review Complete 06/17/2022  Allergen Reaction Noted   Paxil [paroxetine] Other (See Comments)    Tylenol [acetaminophen] Other (See Comments)    Ultram [tramadol] Nausea Only 03/30/2022    Family History  Problem Relation Age of Onset   Varicose Veins Mother    Heart disease Mother    Diabetes Mother    Arthritis Mother    Hypertension Mother    Stroke Father    Heart disease Father    Colon cancer Neg Hx    Stomach cancer Neg Hx    Rectal cancer Neg Hx    Esophageal cancer Neg Hx     Social History   Socioeconomic History   Marital status: Single    Spouse name: Not on file   Number of children: Not on file   Years of education: Not on file   Highest education level: Not on file  Occupational History   Not on file  Tobacco Use   Smoking status: Some Days    Packs/day: 0.15    Types: Cigarettes   Smokeless tobacco: Never  Vaping Use   Vaping Use: Never used  Substance and Sexual Activity   Alcohol use: No   Drug use: No   Sexual activity: Not on file  Other Topics Concern   Not on file  Social History Narrative   Not on file   Social Determinants of Health   Financial Resource Strain: Not on file  Food Insecurity: Unknown (07/20/2022)   Hunger Vital Sign    Worried About Running Out of Food in the Last Year: Patient refused    Ran Out of Food in the Last Year: Patient refused  Transportation Needs: Unknown (07/20/2022)   PRAPARE - Hydrologist (Medical): Patient  refused    Lack of Transportation (Non-Medical): Patient refused  Physical Activity: Not on file  Stress: Not on file  Social Connections: Not on file  Intimate Partner Violence: Unknown (07/20/2022)   Humiliation, Afraid, Rape, and Kick questionnaire    Fear of Current or Ex-Partner: Patient refused    Emotionally Abused: Patient refused    Physically Abused: Patient refused    Sexually Abused: Patient refused    Review of Systems:  All other review of systems negative except as mentioned in the HPI.  Physical Exam: Vital signs BP (!) 155/69   Pulse 84   Temp (!) 97 F (36.1 C) (Temporal)   Resp 17   Ht '6\' 2"'$  (1.88 m)   Wt 108.9 kg   SpO2 100%   BMI 30.81 kg/m   General:   Alert,  Well-developed, well-nourished, pleasant and cooperative in NAD Airway:  Mallampati 3 Lungs:  Clear throughout to auscultation.   Heart:  Regular rate and rhythm; no murmurs, clicks, rubs,  or gallops. Abdomen:  Soft, nontender and nondistended. Normal bowel sounds.   Neuro/Psych:  Normal mood and affect. A and O x 3   Evangaline Jou [name], MD [facility]

## 2022-08-25 NOTE — Telephone Encounter (Signed)
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  Thanks

## 2022-08-26 LAB — SURGICAL PATHOLOGY

## 2022-08-26 NOTE — Addendum Note (Signed)
Encounter addended by: Betsey Holiday on: 08/26/2022 2:22 PM  Actions taken: Imaging Exam ended

## 2022-08-28 ENCOUNTER — Encounter (HOSPITAL_COMMUNITY): Payer: Self-pay | Admitting: Gastroenterology

## 2022-08-28 NOTE — Progress Notes (Signed)
Mr. Bergamo,  The biopsies of your duodenum were unremarkable, showing resolving inflammatory changes only.  No evidence of celiac disease or dysplasia. The biopsies of your stomach were also unremarkable, showing changes commonly seen in patient's taking acid-suppressive medications.  No H. Pylori was seen.  The polyps removed were all tubular adenomas which are common precancerous polyps. As discussed, because you have had 20 tubular adenomas removed, it is recommended you consider genetic testing to look for gene mutations that predispose you to polyp formation and increase the risk of colon cancer. We will place the referral for you.  The rectal polypectomy site from your first colonoscopy that was biopsied did not show evidence of residual polyp.  This is good news.  Given your high number of polyps as well as the incompletely visualized cecum, it is recommended that you repeat a colonoscopy in 1 year.  This should be done again in the hospital setting.  Will see you soon in the office to discuss further management of your ascites and cirrhosis.  Please continue to take the medications as prescribed and follow a strict low sodium diet (less than 2 grams per day).

## 2022-08-30 ENCOUNTER — Other Ambulatory Visit: Payer: Self-pay

## 2022-08-30 DIAGNOSIS — K269 Duodenal ulcer, unspecified as acute or chronic, without hemorrhage or perforation: Secondary | ICD-10-CM

## 2022-08-30 DIAGNOSIS — Z86010 Personal history of colonic polyps: Secondary | ICD-10-CM

## 2022-08-31 ENCOUNTER — Other Ambulatory Visit: Payer: Self-pay

## 2022-08-31 NOTE — Telephone Encounter (Signed)
Inbound call from patient requesting to speak with the nurse in regards to being scheduled for a paracentesis?[facility]leaser f/u with patient and advise.  Thank you

## 2022-09-01 ENCOUNTER — Other Ambulatory Visit (INDEPENDENT_AMBULATORY_CARE_PROVIDER_SITE_OTHER): Payer: Medicaid Other

## 2022-09-01 ENCOUNTER — Other Ambulatory Visit: Payer: Self-pay | Admitting: Diagnostic Radiology

## 2022-09-01 ENCOUNTER — Telehealth: Payer: Self-pay | Admitting: Gastroenterology

## 2022-09-01 ENCOUNTER — Other Ambulatory Visit: Payer: Self-pay

## 2022-09-01 DIAGNOSIS — K7460 Unspecified cirrhosis of liver: Secondary | ICD-10-CM

## 2022-09-01 DIAGNOSIS — K7682 Hepatic encephalopathy: Secondary | ICD-10-CM | POA: Diagnosis not present

## 2022-09-01 DIAGNOSIS — R188 Other ascites: Secondary | ICD-10-CM | POA: Diagnosis not present

## 2022-09-01 LAB — CBC WITH DIFFERENTIAL/PLATELET
Basophils Absolute: 0.1 10*3/uL (ref 0.0–0.1)
Basophils Relative: 0.9 % (ref 0.0–3.0)
Eosinophils Absolute: 0.2 10*3/uL (ref 0.0–0.7)
Eosinophils Relative: 3.1 % (ref 0.0–5.0)
HCT: 33.1 % — ABNORMAL LOW (ref 39.0–52.0)
Hemoglobin: 11.4 g/dL — ABNORMAL LOW (ref 13.0–17.0)
Lymphocytes Relative: 17.4 % (ref 12.0–46.0)
Lymphs Abs: 1.2 10*3/uL (ref 0.7–4.0)
MCHC: 34.6 g/dL (ref 30.0–36.0)
MCV: 92.7 fl (ref 78.0–100.0)
Monocytes Absolute: 0.6 10*3/uL (ref 0.1–1.0)
Monocytes Relative: 8.4 % (ref 3.0–12.0)
Neutro Abs: 5 10*3/uL (ref 1.4–7.7)
Neutrophils Relative %: 70.2 % (ref 43.0–77.0)
Platelets: 153 10*3/uL (ref 150.0–400.0)
RBC: 3.57 Mil/uL — ABNORMAL LOW (ref 4.22–5.81)
RDW: 14.8 % (ref 11.5–15.5)
WBC: 7.1 10*3/uL (ref 4.0–10.5)

## 2022-09-01 LAB — COMPREHENSIVE METABOLIC PANEL
ALT: 22 U/L (ref 0–53)
AST: 37 U/L (ref 0–37)
Albumin: 2.8 g/dL — ABNORMAL LOW (ref 3.5–5.2)
Alkaline Phosphatase: 124 U/L — ABNORMAL HIGH (ref 39–117)
BUN: 8 mg/dL (ref 6–23)
CO2: 25 mEq/L (ref 19–32)
Calcium: 8.1 mg/dL — ABNORMAL LOW (ref 8.4–10.5)
Chloride: 100 mEq/L (ref 96–112)
Creatinine, Ser: 1.12 mg/dL (ref 0.40–1.50)
GFR: 76.09 mL/min (ref >60.00)
Glucose, Bld: 98 mg/dL (ref 70–99)
Potassium: 3.2 mEq/L — ABNORMAL LOW (ref 3.5–5.1)
Sodium: 133 mEq/L — ABNORMAL LOW (ref 135–145)
Total Bilirubin: 1.8 mg/dL — ABNORMAL HIGH (ref 0.2–1.2)
Total Protein: 5.8 g/dL — ABNORMAL LOW (ref 6.0–8.3)

## 2022-09-01 NOTE — Telephone Encounter (Signed)
See alternate telephone encounter

## 2022-09-01 NOTE — Telephone Encounter (Signed)
Patient is calling looking to get a paracentesis scheduled states he really needs it. Please advise

## 2022-09-02 ENCOUNTER — Other Ambulatory Visit: Payer: Self-pay

## 2022-09-02 ENCOUNTER — Telehealth: Payer: Self-pay

## 2022-09-02 NOTE — Telephone Encounter (Signed)
Do you have a specific amount to be removed? Albumin or labs? Thanks

## 2022-09-02 NOTE — Telephone Encounter (Signed)
Patient last had a paracentesis on 08/19/22. Last labs done yesterday and in EPIC. Patient complains of increased abdominal girth making him feel short of breath and uncomfortable. Confirmed he is taking Lasix 80 mg  AM and 40 mg PM. Spironolactone 100 mg BID. Okay to arrange paracentesis?

## 2022-09-02 NOTE — Telephone Encounter (Signed)
Patient prescribed Xifaxan 08/25/22. Patient states prior authorization needed.

## 2022-09-05 ENCOUNTER — Other Ambulatory Visit: Payer: Self-pay

## 2022-09-05 DIAGNOSIS — R188 Other ascites: Secondary | ICD-10-CM

## 2022-09-05 NOTE — Telephone Encounter (Signed)
IR paracentesis tomorrow [place] at 1 pm. Patient notified. He has been there for this before, states he knows where to go. Arrive at 12:30 pm. Instructed to hold the Lasix and Aldactone the day of the paracentesis. Patient states "I always do or I get too lightheaded."

## 2022-09-06 ENCOUNTER — Ambulatory Visit (HOSPITAL_COMMUNITY)
Admission: RE | Admit: 2022-09-06 | Discharge: 2022-09-06 | Disposition: A | Discharge status: Home / Self Care | Payer: Medicaid Other | Source: Ambulatory Visit | Attending: Gastroenterology | Admitting: Gastroenterology

## 2022-09-06 DIAGNOSIS — R188 Other ascites: Secondary | ICD-10-CM | POA: Insufficient documentation

## 2022-09-06 DIAGNOSIS — K7460 Unspecified cirrhosis of liver: Secondary | ICD-10-CM | POA: Diagnosis not present

## 2022-09-06 HISTORY — PX: IR PARACENTESIS: IMG2679

## 2022-09-06 LAB — BODY FLUID CELL COUNT WITH DIFFERENTIAL
Eos, Fluid: 0 %
Lymphs, Fluid: 79 %
Monocyte-Macrophage-Serous Fluid: 20 % — ABNORMAL LOW (ref 50–90)
Neutrophil Count, Fluid: 1 % (ref 0–25)
Total Nucleated Cell Count, Fluid: 638 cu mm (ref 0–1000)

## 2022-09-06 MED ORDER — ALBUMIN HUMAN 25 % IV SOLN
50.0000 g | Freq: Once | INTRAVENOUS | Status: AC
  Filled 2022-09-06: qty 200

## 2022-09-06 MED ORDER — ALBUMIN HUMAN 25 % IV SOLN
INTRAVENOUS | Status: AC
  Administered 2022-09-06: 50 g via INTRAVENOUS
  Filled 2022-09-06: qty 200

## 2022-09-06 MED ORDER — LIDOCAINE HCL 1 % IJ SOLN
INTRAMUSCULAR | Status: AC
  Administered 2022-09-06: 10 mL
  Filled 2022-09-06: qty 20

## 2022-09-06 NOTE — Telephone Encounter (Signed)
[place] pt needs PA for xifaxan

## 2022-09-06 NOTE — Procedures (Signed)
PROCEDURE SUMMARY:  Successful image-guided paracentesis from the right lower abdomen.  Yielded 8 liters of cloudy yellow fluid.  No immediate complications.  EBL = trace. Patient tolerated well.   Specimen was sent for labs.  Please see imaging section of Epic for full dictation.   Lura Em PA-C 09/06/2022 2:38 PM

## 2022-09-07 ENCOUNTER — Telehealth: Payer: Self-pay

## 2022-09-07 LAB — PATHOLOGIST SMEAR REVIEW

## 2022-09-07 NOTE — Telephone Encounter (Signed)
PA request received via CMM for Xifaxan 550MG  tablets  PA has been submitted to [facility] and is pending determination.  Key: [id number]

## 2022-09-08 ENCOUNTER — Other Ambulatory Visit: Payer: Self-pay

## 2022-09-08 ENCOUNTER — Encounter: Payer: Self-pay | Admitting: Gastroenterology

## 2022-09-08 ENCOUNTER — Other Ambulatory Visit: Payer: BC Managed Care – PPO

## 2022-09-08 ENCOUNTER — Ambulatory Visit (INDEPENDENT_AMBULATORY_CARE_PROVIDER_SITE_OTHER): Payer: Medicaid Other | Admitting: Gastroenterology

## 2022-09-08 VITALS — BP 122/68 | HR 99 | Ht 74.0 in | Wt 272.0 lb

## 2022-09-08 DIAGNOSIS — R188 Other ascites: Secondary | ICD-10-CM

## 2022-09-08 DIAGNOSIS — Z8719 Personal history of other diseases of the digestive system: Secondary | ICD-10-CM

## 2022-09-08 DIAGNOSIS — D638 Anemia in other chronic diseases classified elsewhere: Secondary | ICD-10-CM

## 2022-09-08 DIAGNOSIS — K7682 Hepatic encephalopathy: Secondary | ICD-10-CM

## 2022-09-08 DIAGNOSIS — Z86010 Personal history of colonic polyps: Secondary | ICD-10-CM | POA: Diagnosis not present

## 2022-09-08 DIAGNOSIS — K7469 Other cirrhosis of liver: Secondary | ICD-10-CM

## 2022-09-08 DIAGNOSIS — Q800 Ichthyosis vulgaris: Secondary | ICD-10-CM

## 2022-09-08 DIAGNOSIS — K2100 Gastro-esophageal reflux disease with esophagitis, without bleeding: Secondary | ICD-10-CM

## 2022-09-08 DIAGNOSIS — Z1589 Genetic susceptibility to other disease: Secondary | ICD-10-CM

## 2022-09-08 MED ORDER — SPIRONOLACTONE 100 MG PO TABS
200.0000 mg | ORAL_TABLET | Freq: Two times a day (BID) | ORAL | 3 refills | Status: DC
  Filled 2022-09-08: qty 360, 90d supply, fill #0
  Filled 2023-04-04: qty 360, 90d supply, fill #1

## 2022-09-08 NOTE — Patient Instructions (Addendum)
Increase Lactulose to 60 ML three time daily  We have sent the following medications to your pharmacy for you to pick up at your convenience: Spironolactone   We have sent out an referral for you to Genetics and also Dermatology they will be contacting you to schedule an appointment.  You have been scheduled for an abdominal paracentesis at [facility] radiology (1st floor of hospital) on 09/22/22 at 9:00 am. Please arrive at 8:30 am to your appointment time for registration. Should you need to reschedule this appointment for any reason, please call our office at [phone].  _______________________________________________________  If your blood pressure at your visit was 140/90 or greater, please contact your primary care physician to follow up on this.  _______________________________________________________  If you are age 3 or older, your body mass index should be between 23-30. Your Body mass index is 34.92 kg/m. If this is out of the aforementioned range listed, please consider follow up with your Primary Care Provider.  If you are age 22 or younger, your body mass index should be between 19-25. Your Body mass index is 34.92 kg/m. If this is out of the aformentioned range listed, please consider follow up with your Primary Care Provider.   ________________________________________________________  The [facility] providers would like to encourage you to use [facility] to communicate with providers for non-urgent requests or questions.  Due to long hold times on the telephone, sending your provider a message by [facility] may be a faster and more efficient way to get a response.  Please allow 48 business hours for a response.  Please remember that this is for non-urgent requests.  _______________________________________________________

## 2022-09-08 NOTE — Progress Notes (Signed)
HPI : Erik Howe is a very pleasant 52 year old male with decompensated MASH cirrhosis with refractory ascites, severe erosive esophagitis and NSAID-induced duodenal ulcers and numerous adenomatous polyps who presents for follow up.  He was found to have evidence of cirrhosis after he presented to the emergency department March 10, 2022 with worsening abdominal pain and distention.  CT scan showed a cirrhotic appearing liver with large amount of ascites.  He underwent a therapeutic paracentesis which was negative for SBP, and consistent with high SAAG ascites secondary to portal hypertension. He presented to the emergency department again 3 weeks later with symptomatic ascites and again underwent a therapeutic paracentesis (5 L).  He presented a third time only 2 weeks later (October 24) and again had another therapeutic paracentesis, removing 4 L of fluid.  He was started on 40 mg of Lasix and 100 mg of Aldactone by his primary doctor on October 23  I saw him initially on May 02, 2022 and excluded other causes of chronic liver disease (autoimmune, genetic/metabolic/viral).  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  He was referred for scheduled therapeutic paracenteses and his diuretics were increased.   He underwent EGD and colonoscopy Dec 29th and his EGD was notable for LA Grade D esophagitis and severe, diffuse duodenal ulceration with stenosis.  He was started on twice daily omeprazole and advised to stop all NSAIDs.   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  The bowel prep was also of fair quality.  He continued to require large volume paracentesis despite high dose lasix/aldactone and reported adherence to low sodium diet.  He underwent a successful TIPS on  Jul 20, 2022.  Although the procedure was  uneventful, the patient has not really seen an in improvement in his ascites.  He continues to require frequent LVP and is on lasix 80 mg BID and aldactone 100 mg bid.   He reports strict adherence to a low sodium diet.  He cooks all his meals and eats a lot of fruits and vegetables daily.  He very rarely eats anything from a can or prepared meals.  When he uses butter, it is unsalted.  He has protein shakes daily for breakfast.  He has an appointment to assess TIPs patency in 2 weeks.  He was started on lactulose following the TIPs and is currently taking 45 mL TID.  Although he used to have problems with diarrhea, constipation has been more of an issue lately, having only a bowel movement every 1-2 days.  He does report difficulty with his memory and forgetting things and sometimes feels overwhelmed.  He lives alone and denies having any concerns about his safety.  He denies getting lost, but he does miss turns sometimes.   Rifaximin has been ordered and is awaiting PA approval.  He underwent repeat EGD and colonoscopy March 7th.  The EGD showed of the previously seen esophagitis and duodenal ulceration, small varices and mild portal hypertensive gastropathy were again noted.  The colonoscopy was again very technically difficult due to excessive looping.  The cecum was only partially visualized.  Seven tubular adenomas were removed.   His appetite is improved and he denies any nausea/vomiting or GERD symptoms.  He is taking omeprazole once daily He still has very low energy and gets exhausted from minimal activity (preparing a  meal).  He does not do any exercise, in part due to chronic knee and hip pain.  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  He was referred to [facility] and has his initial appointment with them in May. His only relative that he has  contact with is his sister who lives in [place], [place].  He denies any family history of liver disease or GI malignancy.   He says that his maternal grandmother had to get her 'stomach lining replaced with sheepskin' but he does not know any other details.   Colonoscopy Jun 17, 2022 (Positive FIT test) Impression Extremely difficult colonoscopy due to excessive looping, inadequate prep and patient intolerance (coughing, retching): cecum not visualized - Preparation of the colon was fair.  - Six 3 to 8 mm polyps in the ascending colon, removed with a cold snare. Resected and retrieved.  - Five 3 to 7 mm polyps in the transverse colon, removed with a cold snare. Resected and retrieved.  - One 10 mm polyp in the sigmoid colon, removed with a cold snare. Resected and retrieved.  - One 8 mm polyp in the rectum, removed with a cold snare. Resected and retrieved. Clip (MR conditional) was placed.  - A few non-bleeding colonic angioectasias.  - Non-bleeding hemorrhoids.  EGD Jun 17, 2022 (Variceal screening, nausea/vomiting) Impression - The examined portions of the nasopharynx, oropharynx and larynx were normal.  - LA Grade D reflux esophagitis with no bleeding.  - Portal hypertensive gastropathy. Biopsied.  - 6 cm hiatal hernia.  - Partially obstructing non-bleeding severe duodenal ulceration. Biopsied.  - Erythematous duodenopathy  1. Surgical [P], duodenal ulcers - DUODENAL MUCOSA WITH REACTIVE/REGENERATIVE TYPE CHANGES CONSISTENT WITH ADJACENT ULCER - NEGATIVE FOR MALIGNANCY - SEE NOTE 2. Surgical [P], duodenal - DUODENAL MUCOSA WITH PROMINENT BRUNNER'S GLANDS, FOCAL FOVEOLAR METAPLASIA AND REACTIVE/REGENERATIVE CHANGES CONSISTENT WITH CHRONIC PEPTIC DUODENITIS - NEGATIVE FOR MALIGNANCY - SEE NOTE 3. Surgical [P], gastric - ANTRAL AND OXYNTIC MUCOSA WITH FOCAL, SLIGHT CHRONIC INFLAMMATION - OXYNTIC MUCOSA WITH EARLY PROTON PUMP INHIBITOR TYPE EFFECT/EARLY FUNDIC GLAND POLYP  LIKE CHANGES - IMMUNOHISTOCHEMICAL STAIN FOR HELICOBACTER ORGANISMS IS NEGATIVE 4. Surgical [P], colon, ascending -6, transverse - 5, polyp (11) - TUBULAR ADENOMA(S) (MULTIPLE FRAGMENTS) - NEGATIVE FOR HIGH-GRADE DYSPLASIA OR MALIGNANCY - SEE NOTE 5. Surgical [P], colon, sigmoid and rectal, polyp (2) - TUBULOVILLOUS ADENOMA (1 FRAGMENT) - TUBULAR ADENOMA (1 FRAGMENT) - NEGATIVE FOR HIGH-GRADE DYSPLASIA OR MALIGNANCY  -----------------------------------------------------------------------------------------------   Colonoscopy Aug 25, 2022 (incomplete colonoscopy, surveillance >10 adenomas Impression - Three 3 to 6 mm polyps in the ascending colon, removed with a cold snare. Resected and retrieved.  - One 2 mm polyp in the ascending colon, removed with a jumbo cold forceps. Resected and retrieved.  - One 6 mm polyp in the transverse colon, removed with a cold snare. Resected and retrieved.  - One 3 mm polyp in the descending colon, removed with a cold snare. Resected and retrieved.  - One 5 mm polyp in the sigmoid colon, removed with a cold snare. Resected and retrieved.  - Hemoclip from previous polypectomy in the rectum with mucosa suspicious for remnantadenoma, biopsied.  - A single colonic angiodysplastic lesion. Treated with argon plasma coagulation (APC).  - The distal rectum and anal verge are normal on retroflexion view.  - Not all of the cecum was visualized and  the appendiceal orifice was never definitively identified. The patient's refractory ascites is likely contributing to the technical difficulty of the procedure.  EGD Aug 25, 2022 Impression - The examined portions of the nasopharynx, oropharynx and larynx were normal. - Z-line irregular. The previously noted esophagitis has healed.  - Grade I esophageal varices. These do no require banding or beta blocker prophylaxis  - 5 cm hiatal hernia.  - Portal hypertensive gastropathy. Biopsied.  - Focal granular mucosa in the  second portion of the duodenum. Likely healing inflammation. Biopsied to exclude dysplasia  - Duodenal scarring from previous ulceration.  Path A. DUODENUM, INFLAMED MUCOSA, BIOPSY: - Duodenal mucosa with reactive histopathologic changes - Negative for increased intraepithelial lymphocytes or villous architectural changes  B. STOMACH, BIOPSY: - Gastric oxyntic mucosa with parietal cell hyperplasia as can be seen in hypergastrinemic states such as PPI therapy. - Helicobacter pylori-like organisms are not identified on routine HE stain  C. COLON, ASCENDING, TRANSVERSE, POLYPECTOMY: - Tubular adenoma(s) - Negative for high-grade dysplasia or malignancy  D. COLON, DESCENDING, SIGMOID, POLYPECTOMY: - Tubular adenoma(s) - Negative for high-grade dysplasia or malignancy  E. RECTAL POLYPECTOMY SITE, BIOPSY: - Polypoid rectal mucosa with mild nonspecific hyperplastic changes - Negative for dysplasia or malignancy     TTE Jul 08, 2022 Impression 1. Left ventricular ejection fraction, by estimation, is 65 to 70%. The  left ventricle has normal function. The left ventricle has no regional  wall motion abnormalities. Left ventricular diastolic parameters were  normal.   2. Right ventricular systolic function is normal. The right ventricular  size is normal.   3. The mitral valve is normal in structure. No evidence of mitral valve  regurgitation. No evidence of mitral stenosis.   4. The aortic valve is tricuspid. There is mild calcification of the  aortic valve. There is mild thickening of the aortic valve. Aortic valve  regurgitation is not visualized. Aortic valve sclerosis is present, with  no evidence of aortic valve stenosis.   5. The inferior vena cava is normal in size with greater than 50%  respiratory variability, suggesting right atrial pressure of 3 mmHg.   CT abdomen/pelvis Sept 21, 2023 CLINICAL DATA:  Abdominal pain   EXAM: CT ABDOMEN AND PELVIS WITH CONTRAST    TECHNIQUE: Multidetector CT imaging of the abdomen and pelvis was performed using the standard protocol following bolus administration of intravenous contrast.   RADIATION DOSE REDUCTION: This exam was performed according to the departmental dose-optimization program which includes automated exposure control, adjustment of the mA and/or kV according to patient size and/or use of iterative reconstruction technique.   CONTRAST:  73mL OMNIPAQUE IOHEXOL 350 MG/ML SOLN   COMPARISON:  02/22/2006   FINDINGS: Lower chest: Visualized lower lung fields are clear.   Hepatobiliary: Mild lobulations are seen in the margin of the liver. No focal abnormalities are seen. There is no dilation of bile ducts. Surgical clips are seen in gallbladder fossa.   Pancreas: No focal abnormalities are seen.   Spleen: Spleen measures 13.6 cm in maximum diameter.   Adrenals/Urinary Tract: Adrenals are unremarkable. There is no hydronephrosis. There are no renal or ureteral stones. There are few smooth marginated low-density lesions in the left kidney suggesting possible cysts. Urinary bladder is not distended.   Stomach/Bowel: Small hiatal hernia is seen. Small bowel loops are not dilated. Appendix is not seen. There is no significant wall thickening in colon.   Vascular/Lymphatic: Scattered arterial calcifications are seen.   Reproductive: Prostate appears smaller than  usual in size.   Other: There is large ascites. There is no pneumoperitoneum. Small umbilical hernia containing fat is seen. There is subcutaneous edema, possibly anasarca.   Musculoskeletal: No acute findings are seen.   IMPRESSION: There is no evidence of intestinal obstruction or pneumoperitoneum. There is no hydronephrosis.   Lobulations in the margin of liver suggests possible cirrhosis. Spleen is enlarged in size. There is large ascites. Small hiatal hernia.   Other findings as described in the body of the  report.    [place] Sept 21, 2023 FINDINGS: Gallbladder: Surgically absent   Common bile duct: Diameter: Not visualized   Liver: Coarse nodular appearance favoring cirrhosis. Poor sonic penetration. Portal vein is patent on color Doppler imaging with normal direction of blood flow towards the liver.   IVC: Not visualized   Pancreas: Not visualized   Spleen: Size and appearance within normal limits. Splenic volume 203 cc (within normal limits).   Right Kidney: Length: 14.3. Echogenicity within normal limits. No mass or hydronephrosis visualized.   Left Kidney: Length: 13.2. Echogenicity within normal limits. No mass or hydronephrosis visualized.   Abdominal aorta: Not visualized   Other findings: At least moderate ascites. Body habitus limits visualization of various structures.   IMPRESSION: 1. Coarse nodular liver favoring cirrhosis. Poor sonic penetration of the liver. 2. Nonvisualization of various structures including the aorta, IVC, pancreas, common bile duct primarily attributable to body habitus. 3. Moderate ascites.  Past Medical History:  Diagnosis Date   Anxiety    Arthritis    Cellulitis 12/18/2012   left foot   Cirrhosis of liver (HCC)    Depression    GERD (gastroesophageal reflux disease)    Hypertension    no longer a problem   Obesity    Varicose veins      Past Surgical History:  Procedure Laterality Date   BIOPSY  08/25/2022   Procedure: BIOPSY;  Surgeon: Daryel November, MD;  Location: [facility];  Service: Gastroenterology;;   CHOLECYSTECTOMY     COLONOSCOPY WITH PROPOFOL N/A 08/25/2022   Procedure: COLONOSCOPY WITH PROPOFOL;  Surgeon: Daryel November, MD;  Location: [facility];  Service: Gastroenterology;  Laterality: N/A;   ENDOVENOUS ABLATION SAPHENOUS VEIN W/ LASER Left 09-30-2013   left greater saphenous vein   ESOPHAGOGASTRODUODENOSCOPY (EGD) WITH PROPOFOL N/A 08/25/2022   Procedure: ESOPHAGOGASTRODUODENOSCOPY (EGD) WITH  PROPOFOL;  Surgeon: Daryel November, MD;  Location: [facility];  Service: Gastroenterology;  Laterality: N/A;   HOT HEMOSTASIS N/A 08/25/2022   Procedure: HOT HEMOSTASIS (ARGON PLASMA COAGULATION/BICAP);  Surgeon: Daryel November, MD;  Location: [facility];  Service: Gastroenterology;  Laterality: N/A;   IR IVUS EACH ADDITIONAL NON CORONARY VESSEL  07/20/2022   IR PARACENTESIS  05/03/2022   IR PARACENTESIS  05/18/2022   IR PARACENTESIS  06/02/2022   IR PARACENTESIS  06/10/2022   IR PARACENTESIS  06/24/2022   IR PARACENTESIS  06/30/2022   IR PARACENTESIS  07/08/2022   IR PARACENTESIS  07/15/2022   IR PARACENTESIS  07/20/2022   IR PARACENTESIS  07/29/2022   IR PARACENTESIS  08/12/2022   IR PARACENTESIS  08/19/2022   IR PARACENTESIS  09/06/2022   IR TIPS  07/20/2022   IR US GUIDE VASC ACCESS RIGHT  07/20/2022   IR US GUIDE VASC ACCESS RIGHT  07/20/2022   POLYPECTOMY  08/25/2022   Procedure: POLYPECTOMY;  Surgeon: Daryel November, MD;  Location: [facility];  Service: Gastroenterology;;   RADIOLOGY WITH ANESTHESIA N/A 07/20/2022   Procedure: TIPS;  Surgeon: Markus Daft, MD;  Location: [facility];  Service: Radiology;  Laterality: N/A;   Family History  Problem Relation Age of Onset   Varicose Veins Mother    Heart disease Mother    Diabetes Mother    Arthritis Mother    Hypertension Mother    Stroke Father    Heart disease Father    Colon cancer Neg Hx    Stomach cancer Neg Hx    Rectal cancer Neg Hx    Esophageal cancer Neg Hx    Social History   Tobacco Use   Smoking status: Some Days    Packs/day: .15    Types: Cigarettes   Smokeless tobacco: Never  Vaping Use   Vaping Use: Never used  Substance Use Topics   Alcohol use: No   Drug use: No   Current Outpatient Medications  Medication Sig Dispense Refill   albuterol (VENTOLIN HFA) 108 (90 Base) MCG/ACT inhaler Inhale 2 puffs into the lungs every 6 (six) hours as needed for shortness of breath 6.7 g 2   albuterol (VENTOLIN  HFA) 108 (90 Base) MCG/ACT inhaler Inhale 2 puffs into the lungs every 6 (six) hours as neededshortness of breath . 18 g 2   amitriptyline (ELAVIL) 10 MG tablet Take 1 Tablet by mouth nightly at bedtime 30 tablet 1   cyclobenzaprine (FLEXERIL) 10 MG tablet Take 1 Tablet by mouth nightly at bedtime as needed for muscle spasms (Patient taking differently: Take 10 mg by mouth at bedtime.) 30 tablet 5   diphenhydrAMINE (SOMINEX) 25 MG tablet Take 50 mg by mouth daily as needed for sleep or itching.     docusate sodium (COLACE) 100 MG capsule Take 200 mg by mouth daily as needed (constipation.).     furosemide (LASIX) 80 MG tablet Take 1 tablet (80 mg total) by mouth 2 (two) times daily AND 0.5 tablets (40 mg total) every evening. 270 tablet 3   lactulose (CHRONULAC) 10 GM/15ML solution Take 45 mLs (30 g total) by mouth in the morning, at noon, in the evening, and at bedtime. 4257 mL 2   melatonin 5 MG TABS Take 5 mg by mouth at bedtime as needed (Sleep).     omeprazole (PRILOSEC) 20 MG capsule Take 1 capsule (20 mg total) by mouth daily. 120 capsule 3   ondansetron (ZOFRAN-ODT) 8 MG disintegrating tablet Take 1 tablet (8 mg total) by mouth 3 (three) times daily as needed for nausea. 90 tablet 0   rifaximin (XIFAXAN) 550 MG TABS tablet Take 1 tablet (550 mg total) by mouth 2 (two) times daily. 180 tablet 3   spironolactone (ALDACTONE) 100 MG tablet Take 1 tablet (100 mg total) by mouth 2 (two) times daily. 30 tablet 2   triamcinolone ointment (KENALOG) 0.1 % apply a thin layer to the affected area(s) by topical route 2 times per day (Patient not taking: Reported on 07/18/2022) 454 g 5   No current facility-administered medications for this visit.   Allergies  Allergen Reactions   Paxil [Paroxetine] Other (See Comments)    Stomach issues Felt "wrong"   Tylenol [Acetaminophen] Other (See Comments)    Told to avoid due to liver issues   Ultram [Tramadol] Nausea Only     Review of Systems: All  systems reviewed and negative except where noted in HPI.    IR Paracentesis  Result Date: 09/06/2022 INDICATION: Recurrent ascites, cirrhosis, 6 weeks status post TIPS by Dr. Merleen Milliner: ULTRASOUND GUIDED diagnostic and therapeutic PARACENTESIS MEDICATIONS: 6  cc 1% lidocaine COMPLICATIONS: None immediate. PROCEDURE: Informed written consent was obtained from the patient after a discussion of the risks, benefits and alternatives to treatment. A timeout was performed prior to the initiation of the procedure. Initial ultrasound scanning demonstrates a large amount of ascites within the right lower abdominal quadrant. The right lower abdomen was prepped and draped in the usual sterile fashion. 1% lidocaine was used for local anesthesia. Following this, a 19 gauge, 7-cm, Yueh catheter was introduced. An ultrasound image was saved for documentation purposes. The paracentesis was performed. The catheter was removed and a dressing was applied. The patient tolerated the procedure well without immediate post procedural complication. Patient received post-procedure intravenous albumin; see nursing notes for details. FINDINGS: A total of approximately 8 L of cloudy yellow fluid was removed. Samples were sent to the laboratory as requested by the clinical team. IMPRESSION: Successful ultrasound-guided paracentesis yielding 8 liters of peritoneal fluid. Read by: Reatha Armour, PA-C Electronically Signed   By: Miachel Roux M.D.   On: 09/06/2022 15:23   IR Paracentesis  Result Date: 08/19/2022 INDICATION: Recurrent ascites, cirrhosis, 4 weeks s/p TIPS by Dr. Merleen Milliner: ULTRASOUND GUIDED THERAPEUTIC PARACENTESIS MEDICATIONS: None. COMPLICATIONS: None immediate. PROCEDURE: Informed written consent was obtained from the patient after a discussion of the risks, benefits and alternatives to treatment. A timeout was performed prior to the initiation of the procedure. Initial ultrasound scanning demonstrates a Moderate amount of  ascites within the right lower abdominal quadrant. The right lower abdomen was prepped and draped in the usual sterile fashion. 1% lidocaine was used for local anesthesia. Following this, a 19 gauge, 7-cm, Yueh catheter was introduced. An ultrasound image was saved for documentation purposes. The paracentesis was performed. The catheter was removed and a dressing was applied. The patient tolerated the procedure well without immediate post procedural complication. Patient received post-procedure intravenous albumin; see nursing notes for details. FINDINGS: A total of approximately 6.3L of ascitic fluid was removed. Samples were sent to the laboratory as requested by the clinical team. IMPRESSION: Successful ultrasound-guided therapeutic paracentesis yielding 6.3 L of peritoneal fluid. Performed and Read by Pasty Spillers, PA-C Electronically Signed   By: Michaelle Birks M.D.   On: 08/19/2022 14:19   IR Paracentesis  Result Date: 08/12/2022 INDICATION: Patient with a history of cirrhosis with large volume ascites. Patient is status post TIPS July 20, 2022 EXAM: ULTRASOUND GUIDED PARACENTESIS MEDICATIONS: 1% lidocaine 10 mL COMPLICATIONS: None immediate. PROCEDURE: Informed written consent was obtained from the patient after a discussion of the risks, benefits and alternatives to treatment. A timeout was performed prior to the initiation of the procedure. Initial ultrasound scanning demonstrates a large amount of ascites within the left lower abdominal quadrant. The left lower abdomen was prepped and draped in the usual sterile fashion. 1% lidocaine was used for local anesthesia. Following this, a 19 gauge, 7-cm, Yueh catheter was introduced. An ultrasound image was saved for documentation purposes. The paracentesis was performed. The catheter was removed and a dressing was applied. The patient tolerated the procedure well without immediate post procedural complication. Patient received post-procedure intravenous  albumin; see nursing notes for details. FINDINGS: A total of approximately 8.7 L of clear yellow fluid was removed. IMPRESSION: Successful ultrasound-guided paracentesis yielding 8.7 liters of peritoneal fluid. Read by: Soyla Dryer, NP PLAN: Patient underwent TIPS creation July 20, 2022 with Dr. Anselm Pancoast. Electronically Signed   By: Albin Felling M.D.   On: 08/12/2022 11:40    Physical Exam: BP 122/68  Pulse 99   Ht 6\' 2"  (1.88 m)   Wt 272 lb (123.4 kg)   SpO2 96%   BMI 34.92 kg/m  Constitutional: Pleasant,well-developed, chronically ill appearing Caucasian male in no acute distress. HEENT: Normocephalic and atraumatic. Conjunctivae are normal. No scleral icterus. Neck supple.  Cardiovascular: Normal rate, regular rhythm.  Pulmonary/chest: Effort normal and breath sounds normal. No wheezing, rales or rhonchi. Abdominal: Soft, nondistended, nontender. Bowel sounds active throughout. There are no masses palpable. Scar in RLQ from recurrent paracentesis Extremities: no edema Neurological: Alert and oriented to person place and time.  No asterixis present Skin: Ichthyosis of bilateral lower extremities; trace b/l edema Psychiatric: Normal mood and affect. Behavior is normal.  CBC    Component Value Date/Time   WBC 7.1 09/01/2022 1129   RBC 3.57 (L) 09/01/2022 1129   HGB 11.4 (L) 09/01/2022 1129   HCT 33.1 (L) 09/01/2022 1129   PLT 153.0 09/01/2022 1129   MCV 92.7 09/01/2022 1129   MCH 32.7 07/21/2022 0411   MCHC 34.6 09/01/2022 1129   RDW 14.8 09/01/2022 1129   LYMPHSABS 1.2 09/01/2022 1129   MONOABS 0.6 09/01/2022 1129   EOSABS 0.2 09/01/2022 1129   BASOSABS 0.1 09/01/2022 1129    CMP     Component Value Date/Time   NA 133 (L) 09/01/2022 1129   K 3.2 (L) 09/01/2022 1129   CL 100 09/01/2022 1129   CO2 25 09/01/2022 1129   GLUCOSE 98 09/01/2022 1129   BUN 8 09/01/2022 1129   CREATININE 1.12 09/01/2022 1129   CREATININE 1.11 07/07/2022 1354   CALCIUM 8.1 (L)  09/01/2022 1129   PROT 5.8 (L) 09/01/2022 1129   ALBUMIN 2.8 (L) 09/01/2022 1129   AST 37 09/01/2022 1129   ALT 22 09/01/2022 1129   ALKPHOS 124 (H) 09/01/2022 1129   BILITOT 1.8 (H) 09/01/2022 1129   GFRNONAA >60 07/21/2022 0411   GFRAA >60 02/11/2018 0722     ASSESSMENT AND PLAN: 52 year old male with presumed MASH cirrhosis with refractory ascites.  His ascites has surprisingly not improved following TIPS placement.  He is on 160 mg lasix daily and aldactone 200 mg daily.  We discussed in detail his sodium intake and it does seem likely he is taking in less than 2000 mg daily.  I offered a referral to a nutritionist to help manage and track his salt intake but he declined. He has appointment with radiology to interrogate the TIPS soon.  If the TIPS appears to be functioning, we can consider switching from lasix to another diuretic (torsemid, bumex?), but would prefer to wait until his hepatology appointment to make any significant changes to his regimen.  For now, will max out his aldactone to 400 mg daily (200 bid).  Plan to recheck CMP in 1-2 weeks. For his HE, will increase his lactulose to 60 mL TID. He has a new mild anemia which may have related to blood loss from esophagitis and duodenal ulcers, but has not improved with healing of these lesions.  He did have a single colonic AVM seen on his colonoscopy which was treated with APC.  Will check iron and B12. We discussed genetic counseling/testing based on his positive HFE as well as his polyposis.  These results may be of limited utility given his lack of children and only family member he maintains contact with is his sister. 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

## 2022-09-09 ENCOUNTER — Other Ambulatory Visit (HOSPITAL_COMMUNITY): Payer: Self-pay

## 2022-09-09 NOTE — Telephone Encounter (Signed)
Received notification from [place] regarding a prior authorization for [facility]. Authorization has been APPROVED from 3.20.24 to 3.20.25.   Per test claim, copay for 14 days supply is $4  Authorization #  [id number]

## 2022-09-09 NOTE — Telephone Encounter (Signed)
PA has been approved

## 2022-09-12 ENCOUNTER — Other Ambulatory Visit: Payer: BC Managed Care – PPO

## 2022-09-13 ENCOUNTER — Other Ambulatory Visit: Payer: Self-pay

## 2022-09-14 ENCOUNTER — Other Ambulatory Visit: Payer: Self-pay

## 2022-09-15 ENCOUNTER — Other Ambulatory Visit: Payer: Self-pay

## 2022-09-15 MED ORDER — AMITRIPTYLINE HCL 10 MG PO TABS
10.0000 mg | ORAL_TABLET | Freq: Every day | ORAL | 1 refills | Status: DC
  Filled 2022-09-15 (×2): qty 30, 30d supply, fill #0
  Filled 2022-12-07 – 2022-12-20 (×2): qty 30, 30d supply, fill #1

## 2022-09-21 ENCOUNTER — Other Ambulatory Visit: Payer: Self-pay

## 2022-09-22 ENCOUNTER — Ambulatory Visit (HOSPITAL_COMMUNITY)
Admission: RE | Admit: 2022-09-22 | Discharge: 2022-09-22 | Disposition: A | Discharge status: Home / Self Care | Payer: Medicaid Other | Source: Ambulatory Visit | Attending: Gastroenterology | Admitting: Gastroenterology

## 2022-09-22 ENCOUNTER — Other Ambulatory Visit (INDEPENDENT_AMBULATORY_CARE_PROVIDER_SITE_OTHER): Payer: Medicaid Other

## 2022-09-22 ENCOUNTER — Ambulatory Visit (HOSPITAL_COMMUNITY): Payer: Medicaid Other

## 2022-09-22 ENCOUNTER — Other Ambulatory Visit: Payer: Self-pay

## 2022-09-22 ENCOUNTER — Telehealth: Payer: Self-pay

## 2022-09-22 DIAGNOSIS — R188 Other ascites: Secondary | ICD-10-CM

## 2022-09-22 DIAGNOSIS — K7682 Hepatic encephalopathy: Secondary | ICD-10-CM

## 2022-09-22 DIAGNOSIS — K7469 Other cirrhosis of liver: Secondary | ICD-10-CM | POA: Diagnosis not present

## 2022-09-22 DIAGNOSIS — Z86010 Personal history of colonic polyps: Secondary | ICD-10-CM | POA: Insufficient documentation

## 2022-09-22 DIAGNOSIS — Q800 Ichthyosis vulgaris: Secondary | ICD-10-CM

## 2022-09-22 HISTORY — PX: IR PARACENTESIS: IMG2679

## 2022-09-22 LAB — BASIC METABOLIC PANEL
BUN: 7 mg/dL (ref 6–23)
CO2: 27 mEq/L (ref 19–32)
Calcium: 8.5 mg/dL (ref 8.4–10.5)
Chloride: 97 mEq/L (ref 96–112)
Creatinine, Ser: 1.02 mg/dL (ref 0.40–1.50)
GFR: 85.1 mL/min (ref >60.00)
Glucose, Bld: 92 mg/dL (ref 70–99)
Potassium: 3.1 mEq/L — ABNORMAL LOW (ref 3.5–5.1)
Sodium: 133 mEq/L — ABNORMAL LOW (ref 135–145)

## 2022-09-22 LAB — IBC + FERRITIN
Ferritin: 754.8 ng/mL — ABNORMAL HIGH (ref 22.0–322.0)
Iron: 180 ug/dL — ABNORMAL HIGH (ref 42–165)
Saturation Ratios: 97.4 % — ABNORMAL HIGH (ref 20.0–50.0)
TIBC: 184.8 ug/dL — ABNORMAL LOW (ref 250.0–450.0)
Transferrin: 132 mg/dL — ABNORMAL LOW (ref 212.0–360.0)

## 2022-09-22 LAB — B12 AND FOLATE PANEL
Folate: 10.9 ng/mL (ref >5.9)
Vitamin B-12: 544 pg/mL (ref 211–911)

## 2022-09-22 MED ORDER — LIDOCAINE HCL 1 % IJ SOLN
INTRAMUSCULAR | Status: AC
  Filled 2022-09-22: qty 20

## 2022-09-22 NOTE — Procedures (Signed)
PROCEDURE SUMMARY:  Successful image-guided paracentesis from the left lower abdomen.  Yielded 3.0 liters of clear, dark yellow fluid.  No immediate complications.  EBL: zero Patient tolerated well.   Specimen was not sent for labs.  Patient is s/p TIPS creation 07/20/22 with Dr. Anselm Pancoast  Please see imaging section of Epic for full dictation.  Joaquim Nam PA-C 09/22/2022 9:27 AM

## 2022-09-22 NOTE — Telephone Encounter (Signed)
Patient is returning Linda's call.

## 2022-09-22 NOTE — Telephone Encounter (Signed)
Spoke with pt and let him know he should be getting a call from rad scheduling to set up the RUQ [place].

## 2022-09-22 NOTE — Telephone Encounter (Signed)
Per Dr. Candis Schatz pt needs a RUQ [place] for [facility] screening. The [place] he will be having with IR for TIPS will not be good enough for hcc screening per radiology. Order in for RUQ [place]. Left message for pt to call back.

## 2022-09-23 ENCOUNTER — Other Ambulatory Visit (HOSPITAL_COMMUNITY): Payer: Self-pay

## 2022-09-25 NOTE — Progress Notes (Signed)
Erik Howe,  Your electrolytes/renal function looked okay.  Your potassium level was still a little low. I recommend you take a daily potassium pill to make sure your levels do not get too low. Your iron panel was again abnormal, showing evidence of iron overload.  This can be seen setting of liver disease/cirrhosis, but may also be related to the HFE mutation previously identified.  Iron overload can be treated with therapeutic phlebotomy which can sometimes result improvement in symptoms such as fatigue and malaise.   I think it would be reasonable to discuss phlebotomy with a hematologist and see if this be beneficial for you.  However, given your anemia, the benefits may not be as clear  [name],  Will you please place a referral for hematology (Dr. Leonides Schanz) to consider therapeutic phlebotomy and place a prescription for potassium chloride tablets PO daily #90 [id number]?

## 2022-09-26 ENCOUNTER — Other Ambulatory Visit: Payer: Self-pay

## 2022-09-26 ENCOUNTER — Ambulatory Visit
Admission: RE | Admit: 2022-09-26 | Discharge: 2022-09-26 | Disposition: A | Discharge status: Home / Self Care | Payer: BC Managed Care – PPO | Source: Ambulatory Visit | Attending: Diagnostic Radiology | Admitting: Diagnostic Radiology

## 2022-09-26 ENCOUNTER — Ambulatory Visit
Admission: RE | Admit: 2022-09-26 | Discharge: 2022-09-26 | Disposition: A | Discharge status: Home / Self Care | Payer: Medicaid Other | Source: Ambulatory Visit | Attending: Diagnostic Radiology | Admitting: Diagnostic Radiology

## 2022-09-26 DIAGNOSIS — R188 Other ascites: Secondary | ICD-10-CM

## 2022-09-26 DIAGNOSIS — K7460 Unspecified cirrhosis of liver: Secondary | ICD-10-CM

## 2022-09-26 DIAGNOSIS — K7469 Other cirrhosis of liver: Secondary | ICD-10-CM

## 2022-09-26 MED ORDER — POTASSIUM CHLORIDE CRYS ER 20 MEQ PO TBCR
40.0000 meq | EXTENDED_RELEASE_TABLET | Freq: Every day | ORAL | 3 refills | Status: DC
  Filled 2022-09-26 – 2022-11-16 (×2): qty 180, 90d supply, fill #0

## 2022-09-26 NOTE — Progress Notes (Signed)
Chief Complaint: Patient was seen in consultation today for TIPS follow-up.  Referring Physician(s): Jenel Lucks, MD   History of Present Illness: Erik Howe is a 52 y.o. male with decompensated NASH cirrhosis and refractory ascites.  He also has a history of erosive esophagitis and NSAID induced duodenal ulcers and adenomatous polyps.  Patient underwent successful TIPS procedure on 07/20/2022.  He has required ultrasound-guided paracentesis since the procedure.  Prior to the TIPS procedure, he had 4 paracentesis in January 2024 and he has had 5 paracentesis procedures since the TIPS procedure.  His last paracentesis was on 09/22/2022 and that was 16 days after his most recent paracentesis.  Only 3 L of fluid was removed on 09/22/2022.  He is currently taking 200 mg of Lasix daily and 400 mg of Aldactone daily.  He is taking lactulose 45 mg 3 times a day and reports bowel movements every other day.  He is awaiting a prescription for rifaximin.  Complains of mild confusion but this has not changed since the procedure.  He has lost approximately 40 pounds since November 2023 and reports decreased appetite.  He also feels much colder than he did prior to the TIPS procedure.  He had an EGD and colonoscopy on 08/25/2022 that demonstrated grade 1 esophageal varices, hiatal hernia, portal hypertensive gastropathy and duodenal scarring from previous ulceration.  He had multiple colonic polyps removed and a single colonic angiodysplastic lesion treated with argon plasma coagulation.  No evidence for high-grade dysplasia or malignancy from the polypectomies.  Patient denies fevers or new pain.  He has occasional abdominal discomfort.  Chronic lower extremity swelling. Patient had a liver duplex immediately prior to the clinic visit.   Past Medical History:  Diagnosis Date   Anxiety    Arthritis    Cellulitis 12/18/2012   left foot   Cirrhosis of liver    Depression    GERD (gastroesophageal reflux  disease)    Hypertension    no longer a problem   Obesity    Varicose veins     Past Surgical History:  Procedure Laterality Date   BIOPSY  08/25/2022   Procedure: BIOPSY;  Surgeon: Jenel Lucks, MD;  Location: [facility];  Service: Gastroenterology;;   CHOLECYSTECTOMY     COLONOSCOPY WITH PROPOFOL N/A 08/25/2022   Procedure: COLONOSCOPY WITH PROPOFOL;  Surgeon: Jenel Lucks, MD;  Location: [facility];  Service: Gastroenterology;  Laterality: N/A;   ENDOVENOUS ABLATION SAPHENOUS VEIN W/ LASER Left 09-30-2013   left greater saphenous vein   ESOPHAGOGASTRODUODENOSCOPY (EGD) WITH PROPOFOL N/A 08/25/2022   Procedure: ESOPHAGOGASTRODUODENOSCOPY (EGD) WITH PROPOFOL;  Surgeon: Jenel Lucks, MD;  Location: [facility];  Service: Gastroenterology;  Laterality: N/A;   HOT HEMOSTASIS N/A 08/25/2022   Procedure: HOT HEMOSTASIS (ARGON PLASMA COAGULATION/BICAP);  Surgeon: Jenel Lucks, MD;  Location: [facility];  Service: Gastroenterology;  Laterality: N/A;   IR IVUS EACH ADDITIONAL NON CORONARY VESSEL  07/20/2022   IR PARACENTESIS  05/03/2022   IR PARACENTESIS  05/18/2022   IR PARACENTESIS  06/02/2022   IR PARACENTESIS  06/10/2022   IR PARACENTESIS  06/24/2022   IR PARACENTESIS  06/30/2022   IR PARACENTESIS  07/08/2022   IR PARACENTESIS  07/15/2022   IR PARACENTESIS  07/20/2022   IR PARACENTESIS  07/29/2022   IR PARACENTESIS  08/12/2022   IR PARACENTESIS  08/19/2022   IR PARACENTESIS  09/06/2022   IR PARACENTESIS  09/22/2022   IR TIPS  07/20/2022  IR US GUIDE VASC ACCESS RIGHT  07/20/2022   IR US GUIDE VASC ACCESS RIGHT  07/20/2022   POLYPECTOMY  08/25/2022   Procedure: POLYPECTOMY;  Surgeon: Jenel Lucks, MD;  Location: [facility];  Service: Gastroenterology;;   RADIOLOGY WITH ANESTHESIA N/A 07/20/2022   Procedure: TIPS;  Surgeon: Richarda Overlie, MD;  Location: [facility];  Service: Radiology;  Laterality: N/A;    Allergies: Paxil [paroxetine], Tylenol [acetaminophen], and  Ultram [tramadol]  Medications: Prior to Admission medications   Medication Sig Start Date End Date Taking? Authorizing Provider  albuterol (VENTOLIN HFA) 108 (90 Base) MCG/ACT inhaler Inhale 2 puffs into the lungs every 6 (six) hours as needed for shortness of breath 04/11/22     albuterol (VENTOLIN HFA) 108 (90 Base) MCG/ACT inhaler Inhale 2 puffs into the lungs every 6 (six) hours as neededshortness of breath . 04/11/22   Verlon Au, MD  amitriptyline (ELAVIL) 10 MG tablet Take 1 tablet (10 mg total) by mouth at bedtime. 09/15/22     cyclobenzaprine (FLEXERIL) 10 MG tablet Take 1 Tablet by mouth nightly at bedtime as needed for muscle spasms Patient taking differently: Take 10 mg by mouth at bedtime. 12/15/21     diphenhydrAMINE (SOMINEX) 25 MG tablet Take 50 mg by mouth daily as needed for sleep or itching.    [provider]  docusate sodium (COLACE) 100 MG capsule Take 200 mg by mouth daily as needed (constipation.).    [provider]  furosemide (LASIX) 80 MG tablet Take 1 tablet (80 mg total) by mouth 2 (two) times daily AND 0.5 tablets (40 mg total) every evening. 08/25/22   Jenel Lucks, MD  lactulose (CHRONULAC) 10 GM/15ML solution Take 45 mLs (30 g total) by mouth in the morning, at noon, in the evening, and at bedtime. 08/25/22 11/23/22  Jenel Lucks, MD  melatonin 5 MG TABS Take 5 mg by mouth at bedtime as needed (Sleep).    [provider]  omeprazole (PRILOSEC) 20 MG capsule Take 1 capsule (20 mg total) by mouth daily. 08/25/22   Jenel Lucks, MD  ondansetron (ZOFRAN-ODT) 8 MG disintegrating tablet Take 1 tablet (8 mg total) by mouth 3 (three) times daily as needed for nausea. 06/07/22     rifaximin (XIFAXAN) 550 MG TABS tablet Take 1 tablet (550 mg total) by mouth 2 (two) times daily. Patient not taking: Reported on 09/08/2022 08/25/22   Jenel Lucks, MD  spironolactone (ALDACTONE) 100 MG tablet Take 2 tablets (200 mg total) by  mouth 2 (two) times daily. 09/08/22   Jenel Lucks, MD     Family History  Problem Relation Age of Onset   Varicose Veins Mother    Heart disease Mother    Diabetes Mother    Arthritis Mother    Hypertension Mother    Stroke Father    Heart disease Father    Colon cancer Neg Hx    Stomach cancer Neg Hx    Rectal cancer Neg Hx    Esophageal cancer Neg Hx     Social History   Socioeconomic History   Marital status: Single    Spouse name: Not on file   Number of children: Not on file   Years of education: Not on file   Highest education level: Not on file  Occupational History   Not on file  Tobacco Use   Smoking status: Some Days    Packs/day: .15    Types: Cigarettes  Smokeless tobacco: Never  Vaping Use   Vaping Use: Never used  Substance and Sexual Activity   Alcohol use: No   Drug use: No   Sexual activity: Not on file  Other Topics Concern   Not on file  Social History Narrative   Not on file   Social Determinants of Health   Financial Resource Strain: Not on file  Food Insecurity: Patient Declined (07/20/2022)   Hunger Vital Sign    Worried About Running Out of Food in the Last Year: Patient declined    Ran Out of Food in the Last Year: Patient declined  Transportation Needs: Patient Declined (07/20/2022)   PRAPARE - Administrator, Civil Service (Medical): Patient declined    Lack of Transportation (Non-Medical): Patient declined  Physical Activity: Not on file  Stress: Not on file  Social Connections: Not on file    ECOG Status: 1 - Symptomatic but completely ambulatory   Review of Systems  Constitutional:  Positive for appetite change and unexpected weight change. Negative for activity change and fever.  Respiratory: Negative.    Cardiovascular:  Positive for leg swelling.  Gastrointestinal:  Positive for abdominal distention and abdominal pain.  Genitourinary: Negative.     Vital Signs: There were no vitals taken for  this visit.    Physical Exam Constitutional:      Appearance: He is obese. He is not ill-appearing.  Pulmonary:     Effort: Pulmonary effort is normal.  Abdominal:     General: There is no distension.     Palpations: Abdomen is soft.     Tenderness: There is no abdominal tenderness.  Musculoskeletal:     Right lower leg: Edema present.     Left lower leg: Edema present.     Comments: Right leg swelling is greater than left.  Neurological:     Mental Status: He is alert.        Imaging: IR Paracentesis  Result Date: 09/22/2022 INDICATION: Patient history of NASH cirrhosis, recurrent ascites status post tips creation 07/20/2022 by Dr. Lowella Dandy. Request for therapeutic paracentesis. EXAM: ULTRASOUND GUIDED THERAPEUTIC PARACENTESIS MEDICATIONS: 7 mL 1% lidocaine COMPLICATIONS: None immediate. PROCEDURE: Informed written consent was obtained from the patient after a discussion of the risks, benefits and alternatives to treatment. A timeout was performed prior to the initiation of the procedure. Initial ultrasound scanning demonstrates a large amount of ascites within the left lower abdominal quadrant. The left lower abdomen was prepped and draped in the usual sterile fashion. 1% lidocaine was used for local anesthesia. Following this, a 19 gauge, 10-cm, Yueh catheter was introduced. An ultrasound image was saved for documentation purposes. The paracentesis was performed. The catheter was removed and a dressing was applied. The patient tolerated the procedure well without immediate post procedural complication. FINDINGS: A total of approximately 3.0 L of clear, dark yellow fluid was removed. Scratch IMPRESSION: Successful ultrasound-guided paracentesis yielding 3.0 liters of peritoneal fluid. Read by Lynnette Caffey, PA-C Electronically Signed   By: Irish Lack M.D.   On: 09/22/2022 10:18   IR Paracentesis  Result Date: 09/06/2022 INDICATION: Recurrent ascites, cirrhosis, 6 weeks status  post TIPS by Dr. Samuel Germany: ULTRASOUND GUIDED diagnostic and therapeutic PARACENTESIS MEDICATIONS: 6 cc 1% lidocaine COMPLICATIONS: None immediate. PROCEDURE: Informed written consent was obtained from the patient after a discussion of the risks, benefits and alternatives to treatment. A timeout was performed prior to the initiation of the procedure. Initial ultrasound scanning demonstrates a large amount of  ascites within the right lower abdominal quadrant. The right lower abdomen was prepped and draped in the usual sterile fashion. 1% lidocaine was used for local anesthesia. Following this, a 19 gauge, 7-cm, Yueh catheter was introduced. An ultrasound image was saved for documentation purposes. The paracentesis was performed. The catheter was removed and a dressing was applied. The patient tolerated the procedure well without immediate post procedural complication. Patient received post-procedure intravenous albumin; see nursing notes for details. FINDINGS: A total of approximately 8 L of cloudy yellow fluid was removed. Samples were sent to the laboratory as requested by the clinical team. IMPRESSION: Successful ultrasound-guided paracentesis yielding 8 liters of peritoneal fluid. Read by: Mina Marble, PA-C Electronically Signed   By: Acquanetta Belling M.D.   On: 09/06/2022 15:23    Labs:  CBC: Recent Labs    07/07/22 1354 07/20/22 0942 07/21/22 0411 09/01/22 1129  WBC 7.7 8.1 17.3* 7.1  HGB 11.9* 11.3* 10.0* 11.4*  HCT 33.3* 34.3* 28.5* 33.1*  PLT 272 215 171 153.0    COAGS: Recent Labs    04/12/22 1805 07/07/22 1354 07/20/22 0942 07/21/22 0411  INR 1.5* 1.2* 1.3* 1.5*  APTT 37*  --   --   --     BMP: Recent Labs    03/30/22 1031 04/12/22 1805 05/02/22 1441 07/20/22 0942 07/21/22 0411 09/01/22 1129 09/22/22 1034  NA 135 132*   < > 130* 127* 133* 133*  K 3.4* 4.2   < > 4.6 5.1 3.2* 3.1*  CL 105 102   < > 100 96* 100 97  CO2 26 25   < > 21* 23 25 27   GLUCOSE 93 102*   < > 71  107* 98 92  BUN 8 11   < > 14 10 8 7   CALCIUM 8.2* 7.9*   < > 8.4* 8.5* 8.1* 8.5  CREATININE 0.78 0.87   < > 1.29* 1.00 1.12 1.02  GFRNONAA >60 >60  --  >60 >60  --   --    < > = values in this interval not displayed.    LIVER FUNCTION TESTS: Recent Labs    05/02/22 1441 07/07/22 1354 07/20/22 0942 07/21/22 0411 09/01/22 1129  BILITOT 1.5* 1.3* 1.6* 1.5* 1.8*  AST 41* 31 38 95* 37  ALT 26 18 22  37 22  ALKPHOS 169*  --  74 95 124*  PROT 6.8 6.0* 5.9* 5.4* 5.8*  ALBUMIN 2.9*  --  3.0* 2.7* 2.8*    TUMOR MARKERS: Recent Labs    05/02/22 1441  AFPTM 4.9    Assessment and Plan:  52 year old with decompensated NASH cirrhosis and history of refractory ascites.  He underwent a successful TIPS procedure on 07/20/2022.  He has undergone 5 paracentesis procedures since his procedure.  Although the patient is still requiring large-volume paracentesis, the frequency and volume appears to be slowly decreasing.  He only had 3 L removed at his last paracentesis.  Overall, I think the patient looks very good.  He has lost a significant amount of weight in the last 4 to 5 months and no signs of progressive hepatic encephalopathy since the TIPS procedure.  His liver function has not significantly changed since the procedure and his last bilirubin was 1.8 on 09/01/2022 and it was 1.6 at the day of his TIPS procedure.  Although the patient continues to require paracentesis procedures, I am encouraged that the frequency and volume appears to be slowly decreasing.  Patient had a liver duplex ultrasound today  which confirms that the TIPS stent is patent and there is no evidence for stent stenosis or complications.  There was some ascites noted on examination but patient does not appear to be significantly distended on physical examination.  No plans for any TIPS stent intervention at this time.  The stent was only dilated to 8 mm and we could consider dilating the stent more in the future.  His post TIPS  portosystemic gradient was 8 mmHg.  Patient is scheduled to be evaluated by [facility] in May.  Patient will continue to have his diuretics and lactulose managed by GI.  Plan for routine follow-up without imaging in 6 months.    Electronically Signed: Arn Medaldam R Kieana Livesay 09/26/2022, 11:23 AM   I spent a total of  20 minutes in face to face in clinical consultation, greater than 50% of which was counseling/coordinating care for TIPS follow-up. This time includes review of chart and imaging. Patient ID: Erik Howe, male   DOB: 05/12/1971, 52 y.o.   MRN: [id number]

## 2022-09-29 ENCOUNTER — Telehealth: Payer: Self-pay | Admitting: Nurse Practitioner

## 2022-09-29 NOTE — Telephone Encounter (Signed)
scheduled per referral, pt has been called and confirmed date and time. Pt is aware of location and to arrive early for check in   

## 2022-10-03 ENCOUNTER — Other Ambulatory Visit: Payer: Self-pay

## 2022-10-05 ENCOUNTER — Ambulatory Visit (HOSPITAL_COMMUNITY)
Admission: RE | Admit: 2022-10-05 | Discharge: 2022-10-05 | Disposition: A | Discharge status: Home / Self Care | Payer: Medicaid Other | Source: Ambulatory Visit | Attending: Gastroenterology | Admitting: Gastroenterology

## 2022-10-05 DIAGNOSIS — K7469 Other cirrhosis of liver: Secondary | ICD-10-CM | POA: Insufficient documentation

## 2022-10-07 NOTE — Progress Notes (Signed)
Erik Howe,  Your ultrasound looked good, no evidence of any suspicious masses or lesions.  Plan to repeat ultrasound in 6 months.

## 2022-10-11 ENCOUNTER — Other Ambulatory Visit: Payer: Self-pay

## 2022-10-13 ENCOUNTER — Other Ambulatory Visit: Payer: Self-pay

## 2022-10-19 ENCOUNTER — Encounter: Payer: Self-pay | Admitting: Nurse Practitioner

## 2022-10-19 ENCOUNTER — Inpatient Hospital Stay: Payer: Medicaid Other

## 2022-10-19 ENCOUNTER — Inpatient Hospital Stay: Payer: Medicaid Other | Attending: Nurse Practitioner | Admitting: Nurse Practitioner

## 2022-10-19 DIAGNOSIS — K7460 Unspecified cirrhosis of liver: Secondary | ICD-10-CM | POA: Diagnosis not present

## 2022-10-19 DIAGNOSIS — D649 Anemia, unspecified: Secondary | ICD-10-CM | POA: Insufficient documentation

## 2022-10-19 DIAGNOSIS — Z87891 Personal history of nicotine dependence: Secondary | ICD-10-CM | POA: Diagnosis not present

## 2022-10-19 LAB — CBC WITH DIFFERENTIAL (CANCER CENTER ONLY)
Abs Immature Granulocytes: 0.06 10*3/uL (ref 0.00–0.07)
Basophils Absolute: 0.1 10*3/uL (ref 0.0–0.1)
Basophils Relative: 1 %
Eosinophils Absolute: 0.2 10*3/uL (ref 0.0–0.5)
Eosinophils Relative: 2 %
HCT: 32.3 % — ABNORMAL LOW (ref 39.0–52.0)
Hemoglobin: 11.5 g/dL — ABNORMAL LOW (ref 13.0–17.0)
Immature Granulocytes: 1 %
Lymphocytes Relative: 18 %
Lymphs Abs: 2.1 10*3/uL (ref 0.7–4.0)
MCH: 32.6 pg (ref 26.0–34.0)
MCHC: 35.6 g/dL (ref 30.0–36.0)
MCV: 91.5 fL (ref 80.0–100.0)
Monocytes Absolute: 1.2 10*3/uL — ABNORMAL HIGH (ref 0.1–1.0)
Monocytes Relative: 11 %
Neutro Abs: 7.7 10*3/uL (ref 1.7–7.7)
Neutrophils Relative %: 67 %
Platelet Count: 163 10*3/uL (ref 150–400)
RBC: 3.53 MIL/uL — ABNORMAL LOW (ref 4.22–5.81)
RDW: 15.8 % — ABNORMAL HIGH (ref 11.5–15.5)
WBC Count: 11.4 10*3/uL — ABNORMAL HIGH (ref 4.0–10.5)
nRBC: 0 % (ref 0.0–0.2)

## 2022-10-19 NOTE — Progress Notes (Cosign Needed)
[facility]   [phone] [phone]   Clinic New consult Note   Patient Care Team: [name], Cordelia Poche as PCP - General (Physician Assistant) 10/19/2022  CHIEF COMPLAINTS/PURPOSE OF CONSULTATION:  Iron overload, referred by GI Dr. Tiajuana Amass  HISTORY OF PRESENTING ILLNESS:  Erik Howe 52 y.o. male with PMH including cirrhosis, is here because of iron overload. He was found to have abnormal CBC from 01/11/2011 with Hgb 18.2/HCT 53.9. H/H were normal on CBC's from 03/18/2013, 12/27/2014, 12/10/2017, 03/10/2022 while in ED for abdominal pain. CT scan showed cirrhotic appearing liver with large amount of ascites, s/p paracentesis with negative cytology. He developed thrombocytopenia x1 with low plt count 144K on 03/30/22, subsequently normal.  On 04/12/22 he developed mild anemia, hgg 10 - 12.7 range since then.  Further workup for hepatitis or autoimmune related cirrhosis were negative.  05/02/2022 labs showed iron 153, transferrin 135, 81% saturation, and ferritin 841, B12 and folate are normal, AFP is normal.  HCC screening ultrasounds have been negative for malignancy.  He underwent colonoscopy by Dr. Tomasa Rand 06/17/2022 which showed multiple polyps, nonbleeding hemorrhoids, and a few small nonbleeding angioectasias in the ascending colon; overall 13 of the polyps removed were tubular adenomas; same-day EGD showed grade D esophagitis, portal hypertensive gastropathy, a hiatal hernia, nonbleeding severe duodenal ulceration and erythematous duodenopathy.  Duodenal ulceration and esophagitis had improved on the repeat endoscopy 08/25/2022 but it did show grade 1 esophageal varices which did not require banding or beta-blocker prophylaxis.  Repeat colonoscopy again showed multiple polyps.  Most recent CBC 09/01/2022 showed Hgb 11.4, and iron/ferritin panel 09/22/2022 showed iron 180, transferrin 132, 97% saturation, and ferritin 754.  He required multiple frequent  paracentesis until undergoing TIPS procedure 09/26/22. Also on lasix and aldactone which is managing ascites. He is scheduled with Annamarie Major NP liver specialist next week. He is not on oral iron or multivitamin, does not take steroids/hormones. Donated blood once in the 1990's, never received blood transfusion.   Socially, he is single and lives alone, no children.  He is not working due to inability to stand for long periods of time, has Medicaid.  A former tobacco smoker, quit more than 10 years ago smoked up to 1 pack/day.  Quit drinking alcohol with cirrhosis diagnosis but only occasionally as an adult.  Denies other drug use.  Denies family history of blood disorder or cancer.  Today he presents by himself.  He tires easily, with exertional dyspnea which is stable for him.  His weight fluctuates drastically, weighed 312 lbs on 06/17/22, 240 lbs 08/25/2022, 272 on 08/19/2022, and 259 today, he attributes most of this to fluid status.  He has occasional bleeding with bowel movements.  On lactulose has a BM every 1-3 days.  Denies abdominal pain, n/v.  Has a "sick headache" this week.  He has what he describes as psoriasis on his body, most significantly on his legs which has been there since childhood.  He has a dermatology visit in September.   MEDICAL HISTORY:  Past Medical History:  Diagnosis Date   Anxiety    Arthritis    Cellulitis 12/18/2012   left foot   Cirrhosis of liver (HCC)    Depression    GERD (gastroesophageal reflux disease)    Hypertension    no longer a problem   Obesity    Varicose veins     SURGICAL HISTORY: Past Surgical History:  Procedure Laterality Date   BIOPSY  08/25/2022  Procedure: BIOPSY;  Surgeon: Jenel Lucks, MD;  Location: [facility];  Service: Gastroenterology;;   CHOLECYSTECTOMY     COLONOSCOPY WITH PROPOFOL N/A 08/25/2022   Procedure: COLONOSCOPY WITH PROPOFOL;  Surgeon: Jenel Lucks, MD;  Location: [facility];  Service:  Gastroenterology;  Laterality: N/A;   ENDOVENOUS ABLATION SAPHENOUS VEIN W/ LASER Left 09-30-2013   left greater saphenous vein   ESOPHAGOGASTRODUODENOSCOPY (EGD) WITH PROPOFOL N/A 08/25/2022   Procedure: ESOPHAGOGASTRODUODENOSCOPY (EGD) WITH PROPOFOL;  Surgeon: Jenel Lucks, MD;  Location: [facility];  Service: Gastroenterology;  Laterality: N/A;   HOT HEMOSTASIS N/A 08/25/2022   Procedure: HOT HEMOSTASIS (ARGON PLASMA COAGULATION/BICAP);  Surgeon: Jenel Lucks, MD;  Location: [facility];  Service: Gastroenterology;  Laterality: N/A;   IR IVUS EACH ADDITIONAL NON CORONARY VESSEL  07/20/2022   IR PARACENTESIS  05/03/2022   IR PARACENTESIS  05/18/2022   IR PARACENTESIS  06/02/2022   IR PARACENTESIS  06/10/2022   IR PARACENTESIS  06/24/2022   IR PARACENTESIS  06/30/2022   IR PARACENTESIS  07/08/2022   IR PARACENTESIS  07/15/2022   IR PARACENTESIS  07/20/2022   IR PARACENTESIS  07/29/2022   IR PARACENTESIS  08/12/2022   IR PARACENTESIS  08/19/2022   IR PARACENTESIS  09/06/2022   IR PARACENTESIS  09/22/2022   IR TIPS  07/20/2022   IR US GUIDE VASC ACCESS RIGHT  07/20/2022   IR US GUIDE VASC ACCESS RIGHT  07/20/2022   POLYPECTOMY  08/25/2022   Procedure: POLYPECTOMY;  Surgeon: Jenel Lucks, MD;  Location: [facility];  Service: Gastroenterology;;   RADIOLOGY WITH ANESTHESIA N/A 07/20/2022   Procedure: TIPS;  Surgeon: Richarda Overlie, MD;  Location: [facility] OR;  Service: Radiology;  Laterality: N/A;    SOCIAL HISTORY: Social History   Socioeconomic History   Marital status: Single    Spouse name: Not on file   Number of children: Not on file   Years of education: Not on file   Highest education level: Not on file  Occupational History   Not on file  Tobacco Use   Smoking status: Former    Types: Cigarettes    Quit date: 09/18/2012    Years since quitting: 10.0   Smokeless tobacco: Never  Vaping Use   Vaping Use: Never used  Substance and Sexual Activity   Alcohol use: No   Drug  use: No   Sexual activity: Not on file  Other Topics Concern   Not on file  Social History Narrative   Not on file   Social Determinants of Health   Financial Resource Strain: Not on file  Food Insecurity: Patient Declined (07/20/2022)   Hunger Vital Sign    Worried About Running Out of Food in the Last Year: Patient declined    Ran Out of Food in the Last Year: Patient declined  Transportation Needs: Patient Declined (07/20/2022)   PRAPARE - Administrator, Civil Service (Medical): Patient declined    Lack of Transportation (Non-Medical): Patient declined  Physical Activity: Not on file  Stress: Not on file  Social Connections: Not on file  Intimate Partner Violence: Patient Declined (07/20/2022)   Humiliation, Afraid, Rape, and Kick questionnaire    Fear of Current or Ex-Partner: Patient declined    Emotionally Abused: Patient declined    Physically Abused: Patient declined    Sexually Abused: Patient declined    FAMILY HISTORY: Family History  Problem Relation Age of Onset   Varicose Veins Mother    Heart  disease Mother    Diabetes Mother    Arthritis Mother    Hypertension Mother    Stroke Father    Heart disease Father    Colon cancer Neg Hx    Stomach cancer Neg Hx    Rectal cancer Neg Hx    Esophageal cancer Neg Hx     ALLERGIES:  is allergic to paxil [paroxetine], tylenol [acetaminophen], and ultram [tramadol].  MEDICATIONS:  Current Outpatient Medications  Medication Sig Dispense Refill   albuterol (VENTOLIN HFA) 108 (90 Base) MCG/ACT inhaler Inhale 2 puffs into the lungs every 6 (six) hours as needed for shortness of breath 6.7 g 2   albuterol (VENTOLIN HFA) 108 (90 Base) MCG/ACT inhaler Inhale 2 puffs into the lungs every 6 (six) hours as neededshortness of breath . 18 g 2   amitriptyline (ELAVIL) 10 MG tablet Take 1 tablet (10 mg total) by mouth at bedtime. 30 tablet 1   cyclobenzaprine (FLEXERIL) 10 MG tablet Take 1 Tablet by mouth nightly at  bedtime as needed for muscle spasms (Patient taking differently: Take 10 mg by mouth at bedtime.) 30 tablet 5   diphenhydrAMINE (SOMINEX) 25 MG tablet Take 50 mg by mouth daily as needed for sleep or itching.     docusate sodium (COLACE) 100 MG capsule Take 200 mg by mouth daily as needed (constipation.).     furosemide (LASIX) 80 MG tablet Take 1 tablet (80 mg total) by mouth 2 (two) times daily AND 0.5 tablets (40 mg total) every evening. 270 tablet 3   lactulose (CHRONULAC) 10 GM/15ML solution Take 45 mLs (30 g total) by mouth in the morning, at noon, in the evening, and at bedtime. 4257 mL 2   omeprazole (PRILOSEC) 20 MG capsule Take 1 capsule (20 mg total) by mouth daily. 120 capsule 3   ondansetron (ZOFRAN-ODT) 8 MG disintegrating tablet Take 1 tablet (8 mg total) by mouth 3 (three) times daily as needed for nausea. 90 tablet 0   potassium chloride SA (KLOR-CON M) 20 MEQ tablet Take 2 tablets (40 mEq total) by mouth daily. 180 tablet 3   spironolactone (ALDACTONE) 100 MG tablet Take 2 tablets (200 mg total) by mouth 2 (two) times daily. 360 tablet 3   No current facility-administered medications for this visit.    REVIEW OF SYSTEMS:   Constitutional: Denies fevers, chills or abnormal night sweats (+) fatigue (+) weight fluctuation  Eyes: Denies blurriness of vision, double vision or watery eyes Ears, nose, mouth, throat, and face: Denies mucositis or sore throat Respiratory: Denies cough  or wheezes (+) exertional dyspnea Cardiovascular: Denies palpitation, chest discomfort or lower extremity swelling Gastrointestinal:  Denies nausea, vomiting, constipation, diarrhea, heartburn or change in bowel habits (+) occasional bleeding Skin: (+)  skin rashes Lymphatics: Denies new lymphadenopathy or easy bruising Neurological:Denies numbness, tingling or new weaknesses (+) headache Behavioral/Psych: Mood is stable, no new changes  All other systems were reviewed with the patient and are  negative.  PHYSICAL EXAMINATION: ECOG PERFORMANCE STATUS: 1 - Symptomatic but completely ambulatory  Vitals:   10/19/22 1015  BP: 136/66  Pulse: (!) 108  Resp: 20  Temp: 98.9 F (37.2 C)  SpO2: 100%   Filed Weights   10/19/22 1015  Weight: 259 lb 4.8 oz (117.6 kg)    GENERAL:alert, no distress and comfortable SKIN: dry, scaly skin, severe over the lower legs replacing normal skin EYES: sclera clear NECK: without mass LYMPH:  no palpable cervical, supraclavicular, or axillary lymphadenopathy  LUNGS: coarse  breath sounds in bases, normal breathing effort HEART: regular rate & rhythm, no lower extremity edema ABDOMEN:abdomen soft with normal bowel sounds, mild nonfocal ttp Musculoskeletal:no cyanosis of digits and no clubbing  PSYCH: alert & oriented x 3 with fluent speech NEURO: no focal motor/sensory deficits  LABORATORY DATA:  I have reviewed the data as listed    Latest Ref Rng & Units 10/19/2022   12:13 PM 09/01/2022   11:29 AM 07/21/2022    4:11 AM  CBC  WBC 4.0 - 10.5 K/uL 11.4  7.1  17.3   Hemoglobin 13.0 - 17.0 g/dL 16.1  09.6  04.5   Hematocrit 39.0 - 52.0 % 32.3  33.1  28.5   Platelets 150 - 400 K/uL 163  153.0  171        Latest Ref Rng & Units 09/22/2022   10:34 AM 09/01/2022   11:29 AM 07/21/2022    4:11 AM  CMP  Glucose 70 - 99 mg/dL 92  98  409   BUN 6 - 23 mg/dL 7  8  10    Creatinine 0.40 - 1.50 mg/dL 8.11  9.14  7.82   Sodium 135 - 145 mEq/L 133  133  127   Potassium 3.5 - 5.1 mEq/L 3.1  3.2  5.1   Chloride 96 - 112 mEq/L 97  100  96   CO2 19 - 32 mEq/L 27  25  23    Calcium 8.4 - 10.5 mg/dL 8.5  8.1  8.5   Total Protein 6.0 - 8.3 g/dL  5.8  5.4   Total Bilirubin 0.2 - 1.2 mg/dL  1.8  1.5   Alkaline Phos 39 - 117 U/L  124  95   AST 0 - 37 U/L  37  95   ALT 0 - 53 U/L  22  37      RADIOGRAPHIC STUDIES: I have personally reviewed the radiological images as listed and agreed with the findings in the report. US Abdomen Limited RUQ  (LIVER/GB)  Result Date: 10/05/2022 CLINICAL DATA:  HCC screening. EXAM: ULTRASOUND ABDOMEN LIMITED RIGHT UPPER QUADRANT COMPARISON:  None Available. FINDINGS: Gallbladder: Surgically absent Common bile duct: Diameter: 6.4 mm Liver: Increased heterogeneous echogenicity. Nodular contour. No mass identified. Patient is by report a tips patient. The portal vein is patent. Other: None. IMPRESSION: 1. Cirrhotic liver. No mass identified. 2. The gallbladder is surgically absent. Electronically Signed   By: Gerome Sam III M.D.   On: 10/05/2022 12:17   US ABDOMINAL PELVIC ART/VENT FLOW DOPPLER  Result Date: 09/26/2022 CLINICAL DATA:  52 year old with cirrhosis and refractory ascites. Patient underwent TIPS procedure 07/20/2022. EXAM: DUPLEX ULTRASOUND OF LIVER AND TIPS SHUNT TECHNIQUE: Color and duplex Doppler ultrasound was performed to evaluate the hepatic in-flow and out-flow vessels. COMPARISON:  TIPS procedure 07/20/2022 FINDINGS: Portal Vein Velocities Main:  32 cm/sec Right:  74 cm/sec Left:  6 cm/sec TIPS Stent Velocities Proximal-portal: 74 cm/sec Mid:  139 cm/sec Distal-hepatic: 103 cm/sec IVC: Present and patent with normal respiratory phasicity. Hepatic Vein Velocities Right:  18 cm/sec Mid:  103 cm/sec Left:  19 cm/sec Splenic Vein: 22 cm/sec Superior Mesenteric Vein: 17 cm/sec Hepatic Artery: 143 cm/sec Ascities: Present Varices: Absent Other findings: Liver is heterogeneous with nodular configuration. Findings compatible with known cirrhosis. Poor visualization of the liver internal architecture. Moderate amount of perihepatic ascites. Small amount of fluid in the left upper quadrant. Small to moderate amount of fluid in the lower quadrants. No discrete liver lesion identified. Normal hepatofugal  flow in the hepatic veins. Main portal vein measures 1.7 cm in diameter. Normal hepatopetal flow in the main portal vein. Hepatopetal flow in the right hip portal vein. Difficult to discern the direction of  flow in the left portal vein. IMPRESSION: 1. TIPS stent is patent.  No evidence for intra stent stenosis. 2. Main portal veins are patent. 3. Moderate amount of ascites. Electronically Signed   By: Richarda Overlie M.D.   On: 09/26/2022 12:11   IR Paracentesis  Result Date: 09/22/2022 INDICATION: Patient history of NASH cirrhosis, recurrent ascites status post tips creation 07/20/2022 by Dr. Lowella Dandy. Request for therapeutic paracentesis. EXAM: ULTRASOUND GUIDED THERAPEUTIC PARACENTESIS MEDICATIONS: 7 mL 1% lidocaine COMPLICATIONS: None immediate. PROCEDURE: Informed written consent was obtained from the patient after a discussion of the risks, benefits and alternatives to treatment. A timeout was performed prior to the initiation of the procedure. Initial ultrasound scanning demonstrates a large amount of ascites within the left lower abdominal quadrant. The left lower abdomen was prepped and draped in the usual sterile fashion. 1% lidocaine was used for local anesthesia. Following this, a 19 gauge, 10-cm, Yueh catheter was introduced. An ultrasound image was saved for documentation purposes. The paracentesis was performed. The catheter was removed and a dressing was applied. The patient tolerated the procedure well without immediate post procedural complication. FINDINGS: A total of approximately 3.0 L of clear, dark yellow fluid was removed. Scratch IMPRESSION: Successful ultrasound-guided paracentesis yielding 3.0 liters of peritoneal fluid. Read by Lynnette Caffey, PA-C Electronically Signed   By: Irish Lack M.D.   On: 09/22/2022 10:18     ASSESSMENT & PLAN: 52 yo male with   Elevated ferritin -We reviewed his medical record in detail with the patient. Found to have ferritin 841 and iron saturation in 04/2022 on cirrhosis work up -We reviewed common etiologies, he does not take oral iron, recent blood donation, or hormone/steroid use -We discussed ferritin may be falsely elevated in chronic  inflammation, such as his skin condition, but given the high level will need to r/o hemochromatosis  -If he has hemochromatosis, we discussed phlebotomy program. If hemochromatosis DNA is negative, we would likely recommend liver MRI or biopsy to confirm iron deposition, to justify phlebotomy -However, he has mild anemia, and may not tolerate the frequency that would be necessary to bring down his ferritin (goal <50 in hereditary hemochromatosis). If he doesn't tolerate phlebotomy, we would likely recommend iron chelating agent -Most recent ferritin 754, transferrin 132, 97% saturation, and low TIBC -Will follow up with test result to determine next step and management    Anemia -Developed 03/2022 after cirrhosis diagnosis, hgb range 10 - 12.7 -Positive FIT test last year, EGD/colonoscopies showed no active bleeding but he had multiple polyps and duodenal ulceration  -Repeat 08/2022 showed grade 1 esophageal varices, multiple polyps, and improved ulceration in the duodenum; no significant bleeding  -Folate and B12 normal  -Possibly from cirrhosis and other chronic conditions  Cirrhosis, Child Pugh B, MELD 17 -Etiology unclear at this point, no heavy alcohol use, hepatitis panel and autoimmune markers all negative  -Required multiple frequent paracentesis until TIPS procedure 09/26/22 and starting lasix and aldactone -AFP normal, and screening ABD [place] negative for mass lesion, managed by GI Dr. 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 Mosetta Putt   Orders Placed This Encounter  Procedures   Hemochromatosis DNA-PCR(c282y,h63d)   CBC with Differential (Cancer Center Only)     Standing Status:   Future    Number of Occurrences:   1    Standing Expiration Date:   10/19/2023   Draw extra clot specimen    Standing Status:   Future    Number of Occurrences:   1    Standing Expiration Date:   10/19/2023      All questions were answered. The patient knows to call the clinic with any problems, questions or concerns.    Pollyann Samples, NP 10/19/22    Addendum I have seen the patient, examined him. I agree with the assessment and and plan and have edited the notes.   52 yo male with PMH of liver cirrhosis, with unclear etiology, no heavy alcohol drinking, negative hepatitis, was referred by GI for elevated ferritin and iron level.  We need to rule out hemochromatosis, will order hemochromatosis genetic testing.  If the genetic testing is negative, then I will obtain liver MRI to see if he has radiographic evidence of iron overload.  We can also consider liver biopsy, to evaluate the etiology of his liver cirrhosis.  We briefly discussed treatment for hemochromatosis, typically with phlebotomy if he is able to tolerate (he has mild anemia).  We also discussed the other causes of elevated ferritin and iron level, such as chronic inflammation.  He has diffuse skin changes, especially in the bilateral lower extremities which are covered with scaly gray skin.  He has appointment with dermatology in September.  All questions were answered.  Will call him with lab results and decide what to do next.  Malachy Mood MD 10/19/2022

## 2022-10-20 ENCOUNTER — Encounter: Payer: Self-pay | Admitting: Gastroenterology

## 2022-10-20 ENCOUNTER — Encounter: Payer: Self-pay | Admitting: Nurse Practitioner

## 2022-10-20 ENCOUNTER — Other Ambulatory Visit: Payer: Self-pay

## 2022-10-20 DIAGNOSIS — K7469 Other cirrhosis of liver: Secondary | ICD-10-CM

## 2022-10-21 ENCOUNTER — Ambulatory Visit (HOSPITAL_COMMUNITY)
Admission: RE | Admit: 2022-10-21 | Discharge: 2022-10-21 | Disposition: A | Discharge status: Home / Self Care | Payer: Medicaid Other | Source: Ambulatory Visit | Attending: Gastroenterology | Admitting: Gastroenterology

## 2022-10-21 ENCOUNTER — Other Ambulatory Visit (INDEPENDENT_AMBULATORY_CARE_PROVIDER_SITE_OTHER): Payer: Medicaid Other

## 2022-10-21 DIAGNOSIS — R188 Other ascites: Secondary | ICD-10-CM | POA: Insufficient documentation

## 2022-10-21 DIAGNOSIS — K7469 Other cirrhosis of liver: Secondary | ICD-10-CM | POA: Insufficient documentation

## 2022-10-21 HISTORY — PX: IR PARACENTESIS: IMG2679

## 2022-10-21 LAB — COMPREHENSIVE METABOLIC PANEL
ALT: 21 U/L (ref 0–53)
AST: 38 U/L — ABNORMAL HIGH (ref 0–37)
Albumin: 2.9 g/dL — ABNORMAL LOW (ref 3.5–5.2)
Alkaline Phosphatase: 139 U/L — ABNORMAL HIGH (ref 39–117)
BUN: 9 mg/dL (ref 6–23)
CO2: 24 mEq/L (ref 19–32)
Calcium: 8.3 mg/dL — ABNORMAL LOW (ref 8.4–10.5)
Chloride: 96 mEq/L (ref 96–112)
Creatinine, Ser: 1.12 mg/dL (ref 0.40–1.50)
GFR: 76.02 mL/min (ref >60.00)
Glucose, Bld: 93 mg/dL (ref 70–99)
Potassium: 3.6 mEq/L (ref 3.5–5.1)
Sodium: 128 mEq/L — ABNORMAL LOW (ref 135–145)
Total Bilirubin: 2.8 mg/dL — ABNORMAL HIGH (ref 0.2–1.2)
Total Protein: 6.5 g/dL (ref 6.0–8.3)

## 2022-10-21 LAB — CBC WITH DIFFERENTIAL/PLATELET
Basophils Absolute: 0.1 10*3/uL (ref 0.0–0.1)
Basophils Relative: 0.8 % (ref 0.0–3.0)
Eosinophils Absolute: 0.2 10*3/uL (ref 0.0–0.7)
Eosinophils Relative: 2.2 % (ref 0.0–5.0)
HCT: 32.3 % — ABNORMAL LOW (ref 39.0–52.0)
Hemoglobin: 11.3 g/dL — ABNORMAL LOW (ref 13.0–17.0)
Lymphocytes Relative: 22.1 % (ref 12.0–46.0)
Lymphs Abs: 2.1 10*3/uL (ref 0.7–4.0)
MCHC: 34.9 g/dL (ref 30.0–36.0)
MCV: 92.8 fl (ref 78.0–100.0)
Monocytes Absolute: 1 10*3/uL (ref 0.1–1.0)
Monocytes Relative: 10.9 % (ref 3.0–12.0)
Neutro Abs: 6.1 10*3/uL (ref 1.4–7.7)
Neutrophils Relative %: 64 % (ref 43.0–77.0)
Platelets: 177 10*3/uL (ref 150.0–400.0)
RBC: 3.48 Mil/uL — ABNORMAL LOW (ref 4.22–5.81)
RDW: 17.3 % — ABNORMAL HIGH (ref 11.5–15.5)
WBC: 9.5 10*3/uL (ref 4.0–10.5)

## 2022-10-21 LAB — BODY FLUID CELL COUNT WITH DIFFERENTIAL
Eos, Fluid: 0 %
Lymphs, Fluid: 87 %
Monocyte-Macrophage-Serous Fluid: 5 % — ABNORMAL LOW (ref 50–90)
Neutrophil Count, Fluid: 8 % (ref 0–25)
Total Nucleated Cell Count, Fluid: 590 cu mm (ref 0–1000)

## 2022-10-21 LAB — PROTIME-INR
INR: 1.6 ratio — ABNORMAL HIGH (ref 0.8–1.0)
Prothrombin Time: 16.7 s — ABNORMAL HIGH (ref 9.6–13.1)

## 2022-10-21 MED ORDER — LIDOCAINE HCL 1 % IJ SOLN
10.0000 mL | Freq: Once | INTRAMUSCULAR | Status: AC
  Administered 2022-10-21: 10 mL

## 2022-10-21 MED ORDER — LIDOCAINE HCL 1 % IJ SOLN
INTRAMUSCULAR | Status: AC
  Filled 2022-10-21: qty 20

## 2022-10-21 NOTE — Procedures (Signed)
PROCEDURE SUMMARY:  Successful US guided paracentesis from left lateral abdomen.  Yielded 3.5 liters of amber, cloudy fluid.  No immediate complications.  Pt tolerated well.   Specimen was sent for labs.  EBL < 5mL  Hoyt Koch PA-C 10/21/2022 9:37 AM

## 2022-10-24 LAB — PATHOLOGIST SMEAR REVIEW: Path Review: NEGATIVE

## 2022-10-26 ENCOUNTER — Other Ambulatory Visit: Payer: Self-pay | Admitting: Diagnostic Radiology

## 2022-10-26 DIAGNOSIS — R188 Other ascites: Secondary | ICD-10-CM

## 2022-10-28 ENCOUNTER — Other Ambulatory Visit (HOSPITAL_COMMUNITY): Payer: Self-pay | Admitting: Nurse Practitioner

## 2022-10-28 ENCOUNTER — Encounter (HOSPITAL_BASED_OUTPATIENT_CLINIC_OR_DEPARTMENT_OTHER): Payer: Self-pay | Admitting: Nurse Practitioner

## 2022-10-28 ENCOUNTER — Other Ambulatory Visit: Payer: Self-pay

## 2022-10-28 DIAGNOSIS — R188 Other ascites: Secondary | ICD-10-CM

## 2022-10-28 MED ORDER — BACLOFEN 5 MG PO TABS
5.0000 mg | ORAL_TABLET | Freq: Three times a day (TID) | ORAL | 11 refills | Status: DC
  Filled 2022-10-28 – 2022-11-16 (×2): qty 90, 30d supply, fill #0
  Filled 2022-12-20: qty 90, 30d supply, fill #1

## 2022-11-03 ENCOUNTER — Inpatient Hospital Stay: Payer: Medicaid Other | Admitting: Nurse Practitioner

## 2022-11-04 ENCOUNTER — Other Ambulatory Visit: Payer: Self-pay

## 2022-11-11 ENCOUNTER — Encounter (HOSPITAL_COMMUNITY): Payer: Self-pay

## 2022-11-11 ENCOUNTER — Ambulatory Visit (HOSPITAL_COMMUNITY): Payer: Medicaid Other

## 2022-11-15 LAB — HEMOCHROMATOSIS DNA-PCR(C282Y,H63D)

## 2022-11-16 ENCOUNTER — Other Ambulatory Visit: Payer: Self-pay

## 2022-11-17 ENCOUNTER — Other Ambulatory Visit: Payer: Self-pay

## 2022-11-17 ENCOUNTER — Ambulatory Visit (HOSPITAL_COMMUNITY)
Admission: RE | Admit: 2022-11-17 | Discharge: 2022-11-17 | Disposition: A | Discharge status: Home / Self Care | Payer: Medicaid Other | Source: Ambulatory Visit | Attending: Nurse Practitioner | Admitting: Nurse Practitioner

## 2022-11-17 DIAGNOSIS — R188 Other ascites: Secondary | ICD-10-CM | POA: Insufficient documentation

## 2022-11-17 HISTORY — PX: IR PARACENTESIS: IMG2679

## 2022-11-17 MED ORDER — LIDOCAINE HCL 1 % IJ SOLN
INTRAMUSCULAR | Status: AC
  Filled 2022-11-17: qty 20

## 2022-11-17 MED ORDER — ALBUMIN HUMAN 25 % IV SOLN
50.0000 g | Freq: Once | INTRAVENOUS | Status: AC
  Administered 2022-11-17: 50 g via INTRAVENOUS

## 2022-11-17 MED ORDER — ALBUMIN HUMAN 25 % IV SOLN
INTRAVENOUS | Status: AC
  Filled 2022-11-17: qty 200

## 2022-11-17 NOTE — Procedures (Signed)
PROCEDURE SUMMARY:  Successful ultrasound guided paracentesis from the right lower quadrant.  Yielded 8 L of clear yellow fluid.  No immediate complications.  The patient tolerated the procedure well.   Specimen not sent for labs.  EBL < 2 mL  ** Patient is s/p TIPS January 2024  Erik Howe, [place] [phone] 11/17/2022, 1:00 PM

## 2022-11-23 ENCOUNTER — Ambulatory Visit
Admission: RE | Admit: 2022-11-23 | Discharge: 2022-11-23 | Disposition: A | Discharge status: Home / Self Care | Payer: Medicaid Other | Source: Ambulatory Visit | Attending: Diagnostic Radiology | Admitting: Diagnostic Radiology

## 2022-11-23 DIAGNOSIS — K7460 Unspecified cirrhosis of liver: Secondary | ICD-10-CM

## 2022-11-23 NOTE — Progress Notes (Signed)
Chief Complaint: Patient was consulted remotely today (TeleHealth) for TIPS follow up.   Referring Physician(s): Drazek, Heloise Purpura, Scott  History of Present Illness: Erik Howe is a 52 y.o. male with decompensated MASH cirrhosis.  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  I last saw the patient on September 26, 2022.  At that time, the patient was still requiring paracentesis procedures but the frequency and volume had decreased.  Since our last visit, the patient has required 2 paracentesis procedures, 1 on 10/21/2022 and 1 on 11/17/2022. 8 L was removed at the last paracentesis procedure.  Patient has been seen and evaluated by [facility] service since our last visit.  He is scheduled to go back to [facility] for his transplant workup.  Patient's main complaint is persistent "brain fog".  He complains of having difficulty remembering times and dates.  He says that he gets confused occasionally.  These episodes of confusion have not significantly changed since the TIPS procedure.  Patient is trying to stay active but he continues to have chronic fatigue and spends greater than 50% of his day either in bed or in a chair.  Patient reports no significant change in his diuretics and he continues to take lactulose 2-3 times a day.  He reports no chest pain or shortness of breath.  He continues to have bilateral lower extremity swelling which has not significantly changed.  Patient had an HCC screening ultrasound on 10/05/2022 that was negative for a mass.  Past Medical History:  Diagnosis Date   Anxiety    Arthritis    Cellulitis 12/18/2012   left foot   Cirrhosis of liver (HCC)    Depression    GERD (gastroesophageal reflux disease)    Hypertension    no longer a problem   Obesity    Varicose veins     Past Surgical History:  Procedure Laterality Date   BIOPSY  08/25/2022   Procedure: BIOPSY;  Surgeon: Jenel Lucks,  MD;  Location: [facility];  Service: Gastroenterology;;   CHOLECYSTECTOMY     COLONOSCOPY WITH PROPOFOL N/A 08/25/2022   Procedure: COLONOSCOPY WITH PROPOFOL;  Surgeon: Jenel Lucks, MD;  Location: [facility];  Service: Gastroenterology;  Laterality: N/A;   ENDOVENOUS ABLATION SAPHENOUS VEIN W/ LASER Left 09-30-2013   left greater saphenous vein   ESOPHAGOGASTRODUODENOSCOPY (EGD) WITH PROPOFOL N/A 08/25/2022   Procedure: ESOPHAGOGASTRODUODENOSCOPY (EGD) WITH PROPOFOL;  Surgeon: Jenel Lucks, MD;  Location: [facility];  Service: Gastroenterology;  Laterality: N/A;   HOT HEMOSTASIS N/A 08/25/2022   Procedure: HOT HEMOSTASIS (ARGON PLASMA COAGULATION/BICAP);  Surgeon: Jenel Lucks, MD;  Location: [facility];  Service: Gastroenterology;  Laterality: N/A;   IR IVUS EACH ADDITIONAL NON CORONARY VESSEL  07/20/2022   IR PARACENTESIS  05/03/2022   IR PARACENTESIS  05/18/2022   IR PARACENTESIS  06/02/2022   IR PARACENTESIS  06/10/2022   IR PARACENTESIS  06/24/2022   IR PARACENTESIS  06/30/2022   IR PARACENTESIS  07/08/2022   IR PARACENTESIS  07/15/2022   IR PARACENTESIS  07/20/2022   IR PARACENTESIS  07/29/2022   IR PARACENTESIS  08/12/2022   IR PARACENTESIS  08/19/2022   IR PARACENTESIS  09/06/2022   IR PARACENTESIS  09/22/2022   IR PARACENTESIS  10/21/2022   IR PARACENTESIS  11/17/2022   IR TIPS  07/20/2022   IR US GUIDE VASC ACCESS RIGHT  07/20/2022   IR US GUIDE VASC ACCESS RIGHT  07/20/2022   POLYPECTOMY  08/25/2022   Procedure: POLYPECTOMY;  Surgeon: Jenel Lucks, MD;  Location: [facility];  Service: Gastroenterology;;   RADIOLOGY WITH ANESTHESIA N/A 07/20/2022   Procedure: TIPS;  Surgeon: Richarda Overlie, MD;  Location: [facility] OR;  Service: Radiology;  Laterality: N/A;    Allergies: Paxil [paroxetine], Tylenol [acetaminophen], and Ultram [tramadol]  Medications: Prior to Admission medications   Medication Sig Start Date End Date Taking? Authorizing Provider  albuterol (VENTOLIN  HFA) 108 (90 Base) MCG/ACT inhaler Inhale 2 puffs into the lungs every 6 (six) hours as needed for shortness of breath 04/11/22     albuterol (VENTOLIN HFA) 108 (90 Base) MCG/ACT inhaler Inhale 2 puffs into the lungs every 6 (six) hours as neededshortness of breath . 04/11/22   Verlon Au, MD  amitriptyline (ELAVIL) 10 MG tablet Take 1 tablet (10 mg total) by mouth at bedtime. 09/15/22     Baclofen 5 MG TABS Take 1 tablet (5 mg total) by mouth 3 (three) times daily. 10/28/22     cyclobenzaprine (FLEXERIL) 10 MG tablet Take 1 Tablet by mouth nightly at bedtime as needed for muscle spasms Patient taking differently: Take 10 mg by mouth at bedtime. 12/15/21     diphenhydrAMINE (SOMINEX) 25 MG tablet Take 50 mg by mouth daily as needed for sleep or itching.    [provider]  docusate sodium (COLACE) 100 MG capsule Take 200 mg by mouth daily as needed (constipation.).    [provider]  furosemide (LASIX) 80 MG tablet Take 1 tablet (80 mg total) by mouth 2 (two) times daily AND 0.5 tablets (40 mg total) every evening. 08/25/22   Jenel Lucks, MD  lactulose (CHRONULAC) 10 GM/15ML solution Take 45 mLs (30 g total) by mouth in the morning, at noon, in the evening, and at bedtime. 08/25/22 11/23/22  Jenel Lucks, MD  omeprazole (PRILOSEC) 20 MG capsule Take 1 capsule (20 mg total) by mouth daily. 08/25/22   Jenel Lucks, MD  ondansetron (ZOFRAN-ODT) 8 MG disintegrating tablet Take 1 tablet (8 mg total) by mouth 3 (three) times daily as needed for nausea. 06/07/22     potassium chloride SA (KLOR-CON M) 20 MEQ tablet Take 2 tablets (40 mEq total) by mouth daily. 09/26/22   Jenel Lucks, MD  spironolactone (ALDACTONE) 100 MG tablet Take 2 tablets (200 mg total) by mouth 2 (two) times daily. 09/08/22   Jenel Lucks, MD     Family History  Problem Relation Age of Onset   Varicose Veins Mother    Heart disease Mother    Diabetes Mother    Arthritis Mother     Hypertension Mother    Stroke Father    Heart disease Father    Colon cancer Neg Hx    Stomach cancer Neg Hx    Rectal cancer Neg Hx    Esophageal cancer Neg Hx     Social History   Socioeconomic History   Marital status: Single    Spouse name: Not on file   Number of children: Not on file   Years of education: Not on file   Highest education level: Not on file  Occupational History   Not on file  Tobacco Use   Smoking status: Former    Types: Cigarettes    Quit date: 09/18/2012    Years since quitting: 10.1   Smokeless tobacco: Never  Vaping Use   Vaping Use: Never used  Substance and Sexual Activity  Alcohol use: No   Drug use: No   Sexual activity: Not on file  Other Topics Concern   Not on file  Social History Narrative   Not on file   Social Determinants of Health   Financial Resource Strain: Not on file  Food Insecurity: Patient Declined (07/20/2022)   Hunger Vital Sign    Worried About Running Out of Food in the Last Year: Patient declined    Ran Out of Food in the Last Year: Patient declined  Transportation Needs: Patient Declined (07/20/2022)   PRAPARE - Administrator, Civil Service (Medical): Patient declined    Lack of Transportation (Non-Medical): Patient declined  Physical Activity: Not on file  Stress: Not on file  Social Connections: Not on file    ECOG Status: 3 - Symptomatic, >50% confined to bed  Review of Systems  Constitutional:  Positive for fatigue.  Respiratory:  Negative for shortness of breath.   Cardiovascular:  Negative for chest pain.  Gastrointestinal:  Positive for abdominal distention and diarrhea.  Genitourinary: Negative.   Psychiatric/Behavioral:  Positive for confusion.       Physical Exam No direct physical exam was performed  Vital Signs: There were no vitals taken for this visit.  Imaging: IR Paracentesis  Result Date: 11/17/2022 INDICATION: Patient with a history of cirrhosis with recurrent  ascites. Patient is status post TIPS January 2024. Interventional radiology asked to perform a therapeutic paracentesis with 8 L max. EXAM: ULTRASOUND GUIDED PARACENTESIS MEDICATIONS: 1% lidocaine 10 mL COMPLICATIONS: None immediate. PROCEDURE: Informed written consent was obtained from the patient after a discussion of the risks, benefits and alternatives to treatment. A timeout was performed prior to the initiation of the procedure. Initial ultrasound scanning demonstrates a large amount of ascites within the right lower abdominal quadrant. The right lower abdomen was prepped and draped in the usual sterile fashion. 1% lidocaine was used for local anesthesia. Following this, a 19 gauge, 7-cm, Yueh catheter was introduced. An ultrasound image was saved for documentation purposes. The paracentesis was performed. The catheter was removed and a dressing was applied. The patient tolerated the procedure well without immediate post procedural complication. Patient received post-procedure intravenous albumin; see nursing notes for details. FINDINGS: A total of approximately 8 L of clear yellow fluid was removed. IMPRESSION: Successful ultrasound-guided paracentesis yielding 8 liters of peritoneal fluid. Procedure performed by Alwyn Ren NP PLAN: Patient is s/p TIPS January 2024. Electronically Signed   By: Richarda Overlie M.D.   On: 11/17/2022 13:11    Labs:  CBC: Recent Labs    07/21/22 0411 09/01/22 1129 10/19/22 1213 10/21/22 1038  WBC 17.3* 7.1 11.4* 9.5  HGB 10.0* 11.4* 11.5* 11.3*  HCT 28.5* 33.1* 32.3* 32.3*  PLT 171 153.0 163 177.0    COAGS: Recent Labs    04/12/22 1805 07/07/22 1354 07/20/22 0942 07/21/22 0411 10/21/22 1038  INR 1.5* 1.2* 1.3* 1.5* 1.6*  APTT 37*  --   --   --   --     BMP: Recent Labs    03/30/22 1031 04/12/22 1805 05/02/22 1441 07/20/22 0942 07/21/22 0411 09/01/22 1129 09/22/22 1034 10/21/22 1038  NA 135 132*   < > 130* 127* 133* 133* 128*  K 3.4* 4.2    < > 4.6 5.1 3.2* 3.1* 3.6  CL 105 102   < > 100 96* 100 97 96  CO2 26 25   < > 21* 23 25 27 24   GLUCOSE 93 102*   < >  71 107* 98 92 93  BUN 8 11   < > 14 10 8 7 9   CALCIUM 8.2* 7.9*   < > 8.4* 8.5* 8.1* 8.5 8.3*  CREATININE 0.78 0.87   < > 1.29* 1.00 1.12 1.02 1.12  GFRNONAA >60 >60  --  >60 >60  --   --   --    < > = values in this interval not displayed.    LIVER FUNCTION TESTS: Recent Labs    07/20/22 1610 07/21/22 0411 09/01/22 1129 10/21/22 1038  BILITOT 1.6* 1.5* 1.8* 2.8*  AST 38 95* 37 38*  ALT 22 37 22 21  ALKPHOS 74 95 124* 139*  PROT 5.9* 5.4* 5.8* 6.5  ALBUMIN 3.0* 2.7* 2.8* 2.9*    TUMOR MARKERS: Recent Labs    05/02/22 1441  AFPTM 4.9    Assessment and Plan:  52 year old with MASH cirrhosis and history of refractory ascites.  Patient is status post TIPS procedure on 07/20/2022.  Patient still requires paracentesis procedures, but the frequency has decreased.  Patient is being evaluated by the [facility] transplant service and is scheduled to go to [facility] for additional evaluation.  Previous duplex examination demonstrated that the TIPS stent is patent.  Patient's liver synthetic function is impaired and his MELD score has slightly increased since the TIPS procedure.  His combined MELD score prior to the procedure was 19 and based on labs from 10/21/2022 it is 22. Fortunately patient's confusion or hepatic encephalopathy has not significantly changed since the TIPS.  Plan for follow-up visit in 6 months.   Electronically Signed: Arn Medal 11/23/2022, 3:09 PM   I spent a total of    10 Minutes in remote  clinical consultation, greater than 50% of which was counseling/coordinating care for TIPS follow up.    Visit type: Audio only (telephone). Audio (no video) only due to patient preference. Alternative for in-person consultation at [facility], [address] [place], [place], [place]. discussed and addressed.  Patient ID: Erik Howe, male   DOB:  Apr 01, 1971, 51 y.o.   MRN: [id number]

## 2022-11-24 ENCOUNTER — Encounter: Payer: Self-pay | Admitting: Nurse Practitioner

## 2022-12-07 ENCOUNTER — Other Ambulatory Visit: Payer: Self-pay

## 2022-12-07 ENCOUNTER — Ambulatory Visit (HOSPITAL_COMMUNITY)
Admission: RE | Admit: 2022-12-07 | Discharge: 2022-12-07 | Disposition: A | Discharge status: Home / Self Care | Payer: BLUE CROSS/BLUE SHIELD | Source: Ambulatory Visit | Attending: Nurse Practitioner | Admitting: Nurse Practitioner

## 2022-12-07 DIAGNOSIS — R188 Other ascites: Secondary | ICD-10-CM

## 2022-12-07 HISTORY — PX: IR PARACENTESIS: IMG2679

## 2022-12-07 MED ORDER — LIDOCAINE HCL 1 % IJ SOLN
INTRAMUSCULAR | Status: AC
  Filled 2022-12-07: qty 20

## 2022-12-07 MED ORDER — ALBUMIN HUMAN 25 % IV SOLN
50.0000 g | Freq: Once | INTRAVENOUS | Status: AC
  Administered 2022-12-07: 50 g via INTRAVENOUS

## 2022-12-07 MED ORDER — ALBUMIN HUMAN 25 % IV SOLN
INTRAVENOUS | Status: AC
  Filled 2022-12-07: qty 100

## 2022-12-07 MED ORDER — LIDOCAINE HCL 1 % IJ SOLN
10.0000 mL | Freq: Once | INTRAMUSCULAR | Status: AC
  Administered 2022-12-07: 10 mL

## 2022-12-07 NOTE — Procedures (Signed)
PROCEDURE SUMMARY:  Successful US guided paracentesis from right lower quadrant.  Yielded 8 L of clear yellow fluid.  No immediate complications.  Pt tolerated well.   Specimen not sent for labs.  EBL < 2 mL  ** Patient is s/p TIPS January 2024  Arman Filter [place], NP 12/07/2022 11:37 AM

## 2022-12-14 ENCOUNTER — Other Ambulatory Visit: Payer: Self-pay

## 2022-12-16 ENCOUNTER — Other Ambulatory Visit: Payer: Self-pay

## 2022-12-21 ENCOUNTER — Ambulatory Visit (HOSPITAL_COMMUNITY)
Admission: RE | Admit: 2022-12-21 | Discharge: 2022-12-21 | Disposition: A | Discharge status: Home / Self Care | Payer: BLUE CROSS/BLUE SHIELD | Source: Ambulatory Visit | Attending: Nurse Practitioner | Admitting: Nurse Practitioner

## 2022-12-21 ENCOUNTER — Other Ambulatory Visit: Payer: Self-pay

## 2022-12-21 DIAGNOSIS — R188 Other ascites: Secondary | ICD-10-CM | POA: Insufficient documentation

## 2022-12-21 HISTORY — PX: IR PARACENTESIS: IMG2679

## 2022-12-21 MED ORDER — LIDOCAINE HCL 1 % IJ SOLN
INTRAMUSCULAR | Status: AC
  Filled 2022-12-21: qty 20

## 2022-12-21 MED ORDER — ALBUMIN HUMAN 25 % IV SOLN
50.0000 g | Freq: Once | INTRAVENOUS | Status: AC
  Administered 2022-12-21: 50 g via INTRAVENOUS

## 2022-12-21 MED ORDER — ALBUMIN HUMAN 25 % IV SOLN
INTRAVENOUS | Status: AC
  Filled 2022-12-21: qty 200

## 2022-12-21 NOTE — Procedures (Signed)
PROCEDURE SUMMARY:  Successful image-guided paracentesis from the right lower abdomen.  Yielded 7.7 liters of clear yellow fluid.  No immediate complications.  EBL = trace. Patient tolerated well.   Specimen was not sent for labs.  Please see imaging section of Epic for full dictation.  Patient is s/p TIPS procedure January 2024   Kennieth Francois [facility] 12/21/2022 10:48 AM

## 2023-01-06 ENCOUNTER — Ambulatory Visit (HOSPITAL_COMMUNITY)
Admission: RE | Admit: 2023-01-06 | Discharge: 2023-01-06 | Disposition: A | Discharge status: Home / Self Care | Payer: BLUE CROSS/BLUE SHIELD | Source: Ambulatory Visit | Attending: Gastroenterology | Admitting: Gastroenterology

## 2023-01-06 DIAGNOSIS — Z86010 Personal history of colonic polyps: Secondary | ICD-10-CM | POA: Insufficient documentation

## 2023-01-06 DIAGNOSIS — R188 Other ascites: Secondary | ICD-10-CM | POA: Diagnosis not present

## 2023-01-06 DIAGNOSIS — K7682 Hepatic encephalopathy: Secondary | ICD-10-CM | POA: Diagnosis not present

## 2023-01-06 DIAGNOSIS — K7469 Other cirrhosis of liver: Secondary | ICD-10-CM | POA: Diagnosis not present

## 2023-01-06 DIAGNOSIS — Q800 Ichthyosis vulgaris: Secondary | ICD-10-CM | POA: Diagnosis not present

## 2023-01-06 HISTORY — PX: IR PARACENTESIS: IMG2679

## 2023-01-06 NOTE — Procedures (Signed)
PROCEDURE SUMMARY:  Successful ultrasound guided paracentesis from the left lower quadrant.  Yielded 5.5 L of clear yellow fluid.  No immediate complications.  The patient tolerated the procedure well.   Specimen not sent for labs.  EBL < 2 mL  ** Patient is s/p TIPS January 2024. Currently undergoing evaluation with [facility] for liver transplant.    Alwyn Ren, [place] [phone] 01/06/2023, 10:32 AM

## 2023-01-23 ENCOUNTER — Ambulatory Visit (HOSPITAL_COMMUNITY)
Admission: RE | Admit: 2023-01-23 | Discharge: 2023-01-23 | Disposition: A | Discharge status: Home / Self Care | Payer: BLUE CROSS/BLUE SHIELD | Source: Ambulatory Visit | Attending: Gastroenterology | Admitting: Gastroenterology

## 2023-01-23 DIAGNOSIS — K7682 Hepatic encephalopathy: Secondary | ICD-10-CM | POA: Diagnosis not present

## 2023-01-23 DIAGNOSIS — K7469 Other cirrhosis of liver: Secondary | ICD-10-CM | POA: Diagnosis present

## 2023-01-23 DIAGNOSIS — R188 Other ascites: Secondary | ICD-10-CM | POA: Diagnosis not present

## 2023-01-23 DIAGNOSIS — Q800 Ichthyosis vulgaris: Secondary | ICD-10-CM | POA: Diagnosis not present

## 2023-01-23 DIAGNOSIS — Z86010 Personal history of colonic polyps: Secondary | ICD-10-CM | POA: Insufficient documentation

## 2023-01-23 HISTORY — PX: IR PARACENTESIS: IMG2679

## 2023-01-23 MED ORDER — LIDOCAINE HCL 1 % IJ SOLN
INTRAMUSCULAR | Status: AC
  Filled 2023-01-23: qty 20

## 2023-01-23 NOTE — Procedures (Signed)
PROCEDURE SUMMARY:  Successful US guided paracentesis from left lateral abdomen.  Yielded 1.6 liters of yellow fluid.  No immediate complications.  Pt tolerated well.   Specimen was not sent for labs.  EBL < 5mL  PHC Update 01/23/23: Patient is s/p TIPS 06/2022.  Still requiring frequent paracentesis, however volume apparently trending down.   Hoyt Koch PA-C 01/23/2023 9:29 AM

## 2023-02-13 ENCOUNTER — Other Ambulatory Visit: Payer: Self-pay

## 2023-02-14 ENCOUNTER — Other Ambulatory Visit: Payer: Self-pay

## 2023-02-14 MED ORDER — BACLOFEN 5 MG PO TABS
10.0000 mg | ORAL_TABLET | Freq: Three times a day (TID) | ORAL | 99 refills | Status: DC
Start: 2023-02-02 — End: 2023-07-10
  Filled 2023-02-14: qty 180, 30d supply, fill #0
  Filled 2023-02-15: qty 122, 20d supply, fill #0
  Filled 2023-02-15: qty 58, 10d supply, fill #0
  Filled 2023-02-22: qty 180, 30d supply, fill #0
  Filled 2023-04-04: qty 180, 30d supply, fill #1
  Filled 2023-05-11 – 2023-05-29 (×4): qty 180, 30d supply, fill #2
  Filled 2023-05-29: qty 180, 30d supply, fill #0

## 2023-02-15 ENCOUNTER — Other Ambulatory Visit: Payer: Self-pay

## 2023-02-15 ENCOUNTER — Ambulatory Visit (HOSPITAL_COMMUNITY): Payer: BLUE CROSS/BLUE SHIELD

## 2023-02-17 ENCOUNTER — Other Ambulatory Visit (HOSPITAL_COMMUNITY): Payer: No Typology Code available for payment source

## 2023-02-22 ENCOUNTER — Other Ambulatory Visit: Payer: Self-pay

## 2023-02-23 ENCOUNTER — Other Ambulatory Visit: Payer: Self-pay

## 2023-02-23 ENCOUNTER — Ambulatory Visit (HOSPITAL_COMMUNITY)
Admission: RE | Admit: 2023-02-23 | Discharge: 2023-02-23 | Disposition: A | Discharge status: Home / Self Care | Payer: Medicaid Other | Source: Ambulatory Visit | Attending: Nurse Practitioner | Admitting: Nurse Practitioner

## 2023-02-23 DIAGNOSIS — R188 Other ascites: Secondary | ICD-10-CM | POA: Diagnosis present

## 2023-02-23 HISTORY — PX: IR PARACENTESIS: IMG2679

## 2023-02-23 MED ORDER — ALBUMIN HUMAN 25 % IV SOLN
INTRAVENOUS | Status: AC
  Filled 2023-02-23: qty 100

## 2023-02-23 MED ORDER — LIDOCAINE HCL 1 % IJ SOLN
INTRAMUSCULAR | Status: AC
  Filled 2023-02-23: qty 20

## 2023-02-23 MED ORDER — ALBUMIN HUMAN 25 % IV SOLN
25.0000 g | Freq: Once | INTRAVENOUS | Status: AC
  Administered 2023-02-23: 25 g via INTRAVENOUS

## 2023-02-23 NOTE — Procedures (Signed)
PROCEDURE SUMMARY:  Successful US guided paracentesis from left lateral abdomen.  Yielded 7.5 liters of clear yellow fluid.  No immediate complications.  Patient tolerated well.  EBL = trace  Dennies Coate S Jadis Pitter PA-C 02/23/2023 12:56 PM

## 2023-02-24 ENCOUNTER — Other Ambulatory Visit: Payer: Self-pay

## 2023-03-02 ENCOUNTER — Other Ambulatory Visit: Payer: Self-pay

## 2023-03-02 MED ORDER — CLOBETASOL PROPIONATE 0.05 % EX OINT
1.0000 | TOPICAL_OINTMENT | Freq: Two times a day (BID) | CUTANEOUS | 1 refills | Status: DC
Start: 2023-03-02 — End: 2023-07-21
  Filled 2023-03-02: qty 30, 14d supply, fill #0
  Filled 2023-04-04: qty 30, 14d supply, fill #1

## 2023-03-02 MED ORDER — SILVER SULFADIAZINE 1 % EX CREA
1.0000 | TOPICAL_CREAM | Freq: Two times a day (BID) | CUTANEOUS | 1 refills | Status: DC
  Filled 2023-03-02: qty 50, 25d supply, fill #0

## 2023-03-02 MED ORDER — AMMONIUM LACTATE 12 % EX LOTN
1.0000 | TOPICAL_LOTION | Freq: Two times a day (BID) | CUTANEOUS | 11 refills | Status: DC
  Filled 2023-03-02: qty 225, 30d supply, fill #0

## 2023-03-03 ENCOUNTER — Other Ambulatory Visit: Payer: Self-pay

## 2023-03-06 ENCOUNTER — Other Ambulatory Visit: Payer: Self-pay

## 2023-03-09 ENCOUNTER — Ambulatory Visit (HOSPITAL_COMMUNITY)
Admission: RE | Admit: 2023-03-09 | Discharge: 2023-03-09 | Disposition: A | Discharge status: Home / Self Care | Payer: Medicaid Other | Source: Ambulatory Visit | Attending: Nurse Practitioner | Admitting: Nurse Practitioner

## 2023-03-09 ENCOUNTER — Other Ambulatory Visit: Payer: Self-pay

## 2023-03-09 DIAGNOSIS — R188 Other ascites: Secondary | ICD-10-CM | POA: Diagnosis present

## 2023-03-09 HISTORY — PX: IR PARACENTESIS: IMG2679

## 2023-03-09 MED ORDER — ALBUMIN HUMAN 25 % IV SOLN
25.0000 g | Freq: Once | INTRAVENOUS | Status: AC
  Administered 2023-03-09: 25 g via INTRAVENOUS

## 2023-03-09 MED ORDER — ALBUMIN HUMAN 25 % IV SOLN
INTRAVENOUS | Status: AC
  Filled 2023-03-09: qty 100

## 2023-03-09 MED ORDER — LIDOCAINE HCL 1 % IJ SOLN
INTRAMUSCULAR | Status: AC
  Filled 2023-03-09: qty 20

## 2023-03-09 NOTE — Procedures (Signed)
PROCEDURE SUMMARY:  Successful US guided paracentesis from left lateral abdomen.  Yielded 5.5 lites of yellow fluid.  No immediate complications.  Pt tolerated well.   Specimen was sent for labs.  EBL < 5mL  Hoyt Koch PA-C 03/09/2023 1:18 PM

## 2023-03-31 ENCOUNTER — Ambulatory Visit (HOSPITAL_COMMUNITY)
Admission: RE | Admit: 2023-03-31 | Discharge: 2023-03-31 | Disposition: A | Discharge status: Home / Self Care | Payer: BLUE CROSS/BLUE SHIELD | Source: Ambulatory Visit | Attending: Gastroenterology | Admitting: Gastroenterology

## 2023-03-31 DIAGNOSIS — K7469 Other cirrhosis of liver: Secondary | ICD-10-CM | POA: Diagnosis not present

## 2023-03-31 DIAGNOSIS — R188 Other ascites: Secondary | ICD-10-CM | POA: Insufficient documentation

## 2023-03-31 DIAGNOSIS — K7682 Hepatic encephalopathy: Secondary | ICD-10-CM | POA: Insufficient documentation

## 2023-03-31 DIAGNOSIS — Z860100 Personal history of colon polyps, unspecified: Secondary | ICD-10-CM

## 2023-03-31 DIAGNOSIS — Q800 Ichthyosis vulgaris: Secondary | ICD-10-CM | POA: Insufficient documentation

## 2023-03-31 HISTORY — PX: IR PARACENTESIS: IMG2679

## 2023-03-31 MED ORDER — LIDOCAINE HCL 1 % IJ SOLN
INTRAMUSCULAR | Status: AC
  Filled 2023-03-31: qty 20

## 2023-03-31 MED ORDER — LIDOCAINE HCL 1 % IJ SOLN: 20.0000 mL | Freq: Once | INTRAMUSCULAR | Status: DC

## 2023-03-31 NOTE — Procedures (Signed)
PROCEDURE SUMMARY:  Successful US guided paracentesis from left lateral abdomen.  Yielded 2.2 liters of amber fluid.  No immediate complications.  Pt tolerated well.   Specimen was not sent for labs.  EBL < 5mL  Hoyt Koch PA-C 03/31/2023 12:03 PM

## 2023-04-04 ENCOUNTER — Other Ambulatory Visit (HOSPITAL_COMMUNITY): Payer: Self-pay

## 2023-04-06 ENCOUNTER — Encounter: Payer: Self-pay | Admitting: Pharmacist

## 2023-04-06 ENCOUNTER — Other Ambulatory Visit: Payer: Self-pay

## 2023-04-07 ENCOUNTER — Other Ambulatory Visit (HOSPITAL_COMMUNITY): Payer: Self-pay

## 2023-04-11 ENCOUNTER — Other Ambulatory Visit: Payer: Self-pay

## 2023-04-11 DIAGNOSIS — K7469 Other cirrhosis of liver: Secondary | ICD-10-CM

## 2023-05-01 ENCOUNTER — Ambulatory Visit (HOSPITAL_COMMUNITY)
Admission: RE | Admit: 2023-05-01 | Discharge: 2023-05-01 | Disposition: A | Discharge status: Home / Self Care | Payer: BLUE CROSS/BLUE SHIELD | Source: Ambulatory Visit | Attending: Gastroenterology | Admitting: Gastroenterology

## 2023-05-01 DIAGNOSIS — K7469 Other cirrhosis of liver: Secondary | ICD-10-CM | POA: Insufficient documentation

## 2023-05-01 DIAGNOSIS — Z860100 Personal history of colon polyps, unspecified: Secondary | ICD-10-CM | POA: Diagnosis not present

## 2023-05-01 DIAGNOSIS — R188 Other ascites: Secondary | ICD-10-CM | POA: Insufficient documentation

## 2023-05-01 DIAGNOSIS — Q800 Ichthyosis vulgaris: Secondary | ICD-10-CM | POA: Insufficient documentation

## 2023-05-01 DIAGNOSIS — K7682 Hepatic encephalopathy: Secondary | ICD-10-CM | POA: Insufficient documentation

## 2023-05-01 HISTORY — PX: IR PARACENTESIS: IMG2679

## 2023-05-01 MED ORDER — ALBUMIN HUMAN 25 % IV SOLN
50.0000 g | Freq: Once | INTRAVENOUS | Status: AC
  Administered 2023-05-01: 50 g via INTRAVENOUS

## 2023-05-01 MED ORDER — LIDOCAINE HCL 1 % IJ SOLN
10.0000 mL | Freq: Once | INTRAMUSCULAR | Status: AC
  Administered 2023-05-01: 10 mL via INTRADERMAL

## 2023-05-01 MED ORDER — ALBUMIN HUMAN 25 % IV SOLN
INTRAVENOUS | Status: AC
  Filled 2023-05-01: qty 200

## 2023-05-01 MED ORDER — LIDOCAINE HCL 1 % IJ SOLN
INTRAMUSCULAR | Status: AC
  Filled 2023-05-01: qty 20

## 2023-05-01 NOTE — Progress Notes (Signed)
   IR Paracentesis Procedure Note   Mr. Erik Howe presented for scheduled paracentesis in IR today He was agreeable and consented to procedure. 10cc 1% lidocane given 6L yellow peritoneal fluid drained from left lateral side 0 EBL No complications noted Patient tolerated procedure without concern   Buzzy Han, PA-C Interventional Radiology Service

## 2023-05-02 ENCOUNTER — Ambulatory Visit (HOSPITAL_COMMUNITY)
Admission: RE | Admit: 2023-05-02 | Discharge: 2023-05-02 | Disposition: A | Discharge status: Home / Self Care | Payer: BLUE CROSS/BLUE SHIELD | Source: Ambulatory Visit | Attending: Gastroenterology | Admitting: Gastroenterology

## 2023-05-02 DIAGNOSIS — K7469 Other cirrhosis of liver: Secondary | ICD-10-CM | POA: Insufficient documentation

## 2023-05-11 ENCOUNTER — Other Ambulatory Visit (HOSPITAL_COMMUNITY): Payer: Self-pay | Admitting: Nurse Practitioner

## 2023-05-11 ENCOUNTER — Other Ambulatory Visit (HOSPITAL_COMMUNITY): Payer: Self-pay

## 2023-05-11 DIAGNOSIS — R188 Other ascites: Secondary | ICD-10-CM

## 2023-05-12 ENCOUNTER — Other Ambulatory Visit: Payer: Self-pay

## 2023-05-12 ENCOUNTER — Other Ambulatory Visit (HOSPITAL_COMMUNITY): Payer: Self-pay

## 2023-05-16 ENCOUNTER — Other Ambulatory Visit: Payer: Self-pay

## 2023-05-16 ENCOUNTER — Ambulatory Visit (HOSPITAL_COMMUNITY)
Admission: RE | Admit: 2023-05-16 | Discharge: 2023-05-16 | Disposition: A | Discharge status: Home / Self Care | Payer: BLUE CROSS/BLUE SHIELD | Source: Ambulatory Visit | Attending: Nurse Practitioner | Admitting: Nurse Practitioner

## 2023-05-16 DIAGNOSIS — R188 Other ascites: Secondary | ICD-10-CM | POA: Insufficient documentation

## 2023-05-16 DIAGNOSIS — K7682 Hepatic encephalopathy: Secondary | ICD-10-CM | POA: Diagnosis not present

## 2023-05-16 HISTORY — PX: IR PARACENTESIS: IMG2679

## 2023-05-16 MED ORDER — ALBUMIN HUMAN 25 % IV SOLN
37.5000 g | Freq: Once | INTRAVENOUS | Status: AC
  Administered 2023-05-16: 37.5 g via INTRAVENOUS

## 2023-05-16 MED ORDER — LIDOCAINE HCL 1 % IJ SOLN
INTRAMUSCULAR | Status: AC
  Filled 2023-05-16: qty 20

## 2023-05-16 MED ORDER — ALBUMIN HUMAN 25 % IV SOLN
INTRAVENOUS | Status: AC
  Filled 2023-05-16: qty 50

## 2023-05-16 MED ORDER — ALBUMIN HUMAN 25 % IV SOLN
INTRAVENOUS | Status: AC
  Filled 2023-05-16: qty 150

## 2023-05-16 MED ORDER — ALBUMIN HUMAN 25 % IV SOLN: 12.5000 g | Freq: Once | INTRAVENOUS | Status: DC

## 2023-05-16 MED ORDER — LIDOCAINE HCL 1 % IJ SOLN: 20.0000 mL | Freq: Once | INTRAMUSCULAR | Status: DC

## 2023-05-16 NOTE — Procedures (Signed)
PROCEDURE SUMMARY:  Successful US guided paracentesis from right lateral abdomen.  Yielded 8.0 liters of yellow fluid.  No immediate complications.  Pt tolerated well.   Specimen was not sent for labs.  EBL < 5mL  Hoyt Koch PA-C 05/16/2023 11:15 AM

## 2023-05-17 ENCOUNTER — Other Ambulatory Visit (HOSPITAL_COMMUNITY): Payer: Self-pay

## 2023-05-17 ENCOUNTER — Other Ambulatory Visit: Payer: Self-pay

## 2023-05-19 ENCOUNTER — Emergency Department (HOSPITAL_COMMUNITY): Payer: BLUE CROSS/BLUE SHIELD

## 2023-05-19 ENCOUNTER — Encounter (HOSPITAL_COMMUNITY): Payer: Self-pay | Admitting: Family Medicine

## 2023-05-19 ENCOUNTER — Other Ambulatory Visit: Payer: Self-pay

## 2023-05-19 ENCOUNTER — Inpatient Hospital Stay (HOSPITAL_COMMUNITY)
Admission: EM | Admit: 2023-05-19 | Discharge: 2023-05-24 | DRG: 442 | Disposition: A | Discharge status: Home / Self Care | Payer: BLUE CROSS/BLUE SHIELD | Attending: Internal Medicine | Admitting: Internal Medicine

## 2023-05-19 DIAGNOSIS — Z6835 Body mass index (BMI) 35.0-35.9, adult: Secondary | ICD-10-CM | POA: Diagnosis not present

## 2023-05-19 DIAGNOSIS — D689 Coagulation defect, unspecified: Secondary | ICD-10-CM | POA: Diagnosis present

## 2023-05-19 DIAGNOSIS — D649 Anemia, unspecified: Secondary | ICD-10-CM

## 2023-05-19 DIAGNOSIS — Z79899 Other long term (current) drug therapy: Secondary | ICD-10-CM

## 2023-05-19 DIAGNOSIS — K766 Portal hypertension: Secondary | ICD-10-CM | POA: Diagnosis present

## 2023-05-19 DIAGNOSIS — Z87891 Personal history of nicotine dependence: Secondary | ICD-10-CM

## 2023-05-19 DIAGNOSIS — R188 Other ascites: Secondary | ICD-10-CM | POA: Diagnosis present

## 2023-05-19 DIAGNOSIS — B3781 Candidal esophagitis: Secondary | ICD-10-CM | POA: Diagnosis present

## 2023-05-19 DIAGNOSIS — E8770 Fluid overload, unspecified: Secondary | ICD-10-CM | POA: Diagnosis present

## 2023-05-19 DIAGNOSIS — E876 Hypokalemia: Secondary | ICD-10-CM | POA: Diagnosis present

## 2023-05-19 DIAGNOSIS — K5520 Angiodysplasia of colon without hemorrhage: Secondary | ICD-10-CM | POA: Diagnosis present

## 2023-05-19 DIAGNOSIS — F39 Unspecified mood [affective] disorder: Secondary | ICD-10-CM | POA: Diagnosis present

## 2023-05-19 DIAGNOSIS — K2970 Gastritis, unspecified, without bleeding: Secondary | ICD-10-CM | POA: Diagnosis present

## 2023-05-19 DIAGNOSIS — D638 Anemia in other chronic diseases classified elsewhere: Secondary | ICD-10-CM | POA: Diagnosis present

## 2023-05-19 DIAGNOSIS — K7682 Hepatic encephalopathy: Principal | ICD-10-CM

## 2023-05-19 DIAGNOSIS — K219 Gastro-esophageal reflux disease without esophagitis: Secondary | ICD-10-CM | POA: Diagnosis present

## 2023-05-19 DIAGNOSIS — R1310 Dysphagia, unspecified: Secondary | ICD-10-CM | POA: Diagnosis present

## 2023-05-19 DIAGNOSIS — Z1152 Encounter for screening for COVID-19: Secondary | ICD-10-CM

## 2023-05-19 DIAGNOSIS — K7581 Nonalcoholic steatohepatitis (NASH): Secondary | ICD-10-CM | POA: Diagnosis present

## 2023-05-19 DIAGNOSIS — F418 Other specified anxiety disorders: Secondary | ICD-10-CM | POA: Diagnosis present

## 2023-05-19 DIAGNOSIS — D72829 Elevated white blood cell count, unspecified: Secondary | ICD-10-CM | POA: Diagnosis present

## 2023-05-19 DIAGNOSIS — K7460 Unspecified cirrhosis of liver: Secondary | ICD-10-CM

## 2023-05-19 DIAGNOSIS — G8929 Other chronic pain: Secondary | ICD-10-CM | POA: Diagnosis present

## 2023-05-19 DIAGNOSIS — Z7682 Awaiting organ transplant status: Secondary | ICD-10-CM

## 2023-05-19 DIAGNOSIS — D6959 Other secondary thrombocytopenia: Secondary | ICD-10-CM | POA: Diagnosis present

## 2023-05-19 DIAGNOSIS — Z8249 Family history of ischemic heart disease and other diseases of the circulatory system: Secondary | ICD-10-CM

## 2023-05-19 DIAGNOSIS — R195 Other fecal abnormalities: Secondary | ICD-10-CM | POA: Diagnosis present

## 2023-05-19 DIAGNOSIS — I10 Essential (primary) hypertension: Secondary | ICD-10-CM | POA: Diagnosis present

## 2023-05-19 DIAGNOSIS — Z823 Family history of stroke: Secondary | ICD-10-CM

## 2023-05-19 DIAGNOSIS — E871 Hypo-osmolality and hyponatremia: Secondary | ICD-10-CM | POA: Diagnosis present

## 2023-05-19 DIAGNOSIS — Z8261 Family history of arthritis: Secondary | ICD-10-CM

## 2023-05-19 DIAGNOSIS — N179 Acute kidney failure, unspecified: Secondary | ICD-10-CM | POA: Diagnosis present

## 2023-05-19 DIAGNOSIS — Z833 Family history of diabetes mellitus: Secondary | ICD-10-CM

## 2023-05-19 LAB — URINALYSIS, ROUTINE W REFLEX MICROSCOPIC
Bacteria, UA: NONE SEEN
Bilirubin Urine: NEGATIVE
Glucose, UA: NEGATIVE mg/dL
Ketones, ur: NEGATIVE mg/dL
Leukocytes,Ua: NEGATIVE
Nitrite: NEGATIVE
Protein, ur: NEGATIVE mg/dL
Specific Gravity, Urine: 1.018 (ref 1.005–1.030)
pH: 5 (ref 5.0–8.0)

## 2023-05-19 LAB — CBC WITH DIFFERENTIAL/PLATELET
Abs Immature Granulocytes: 0.03 10*3/uL (ref 0.00–0.07)
Basophils Absolute: 0.1 10*3/uL (ref 0.0–0.1)
Basophils Relative: 1 %
Eosinophils Absolute: 0.6 10*3/uL — ABNORMAL HIGH (ref 0.0–0.5)
Eosinophils Relative: 7 %
HCT: 29 % — ABNORMAL LOW (ref 39.0–52.0)
Hemoglobin: 10.7 g/dL — ABNORMAL LOW (ref 13.0–17.0)
Immature Granulocytes: 0 %
Lymphocytes Relative: 12 %
Lymphs Abs: 1.1 10*3/uL (ref 0.7–4.0)
MCH: 35.1 pg — ABNORMAL HIGH (ref 26.0–34.0)
MCHC: 36.9 g/dL — ABNORMAL HIGH (ref 30.0–36.0)
MCV: 95.1 fL (ref 80.0–100.0)
Monocytes Absolute: 1.1 10*3/uL — ABNORMAL HIGH (ref 0.1–1.0)
Monocytes Relative: 12 %
Neutro Abs: 6.4 10*3/uL (ref 1.7–7.7)
Neutrophils Relative %: 68 %
Platelets: 164 10*3/uL (ref 150–400)
RBC: 3.05 MIL/uL — ABNORMAL LOW (ref 4.22–5.81)
RDW: 14.6 % (ref 11.5–15.5)
WBC: 9.3 10*3/uL (ref 4.0–10.5)
nRBC: 0 % (ref 0.0–0.2)

## 2023-05-19 LAB — COMPREHENSIVE METABOLIC PANEL
ALT: 23 U/L (ref 0–44)
AST: 40 U/L (ref 15–41)
Albumin: 3.1 g/dL — ABNORMAL LOW (ref 3.5–5.0)
Alkaline Phosphatase: 106 U/L (ref 38–126)
Anion gap: 11 (ref 5–15)
BUN: 19 mg/dL (ref 6–20)
CO2: 24 mmol/L (ref 22–32)
Calcium: 8.7 mg/dL — ABNORMAL LOW (ref 8.9–10.3)
Chloride: 98 mmol/L (ref 98–111)
Creatinine, Ser: 1.08 mg/dL (ref 0.61–1.24)
GFR, Estimated: >60 mL/min (ref >60)
Glucose, Bld: 102 mg/dL — ABNORMAL HIGH (ref 70–99)
Potassium: 4 mmol/L (ref 3.5–5.1)
Sodium: 133 mmol/L — ABNORMAL LOW (ref 135–145)
Total Bilirubin: 3.8 mg/dL — ABNORMAL HIGH (ref <1.2)
Total Protein: 6.6 g/dL (ref 6.5–8.1)

## 2023-05-19 LAB — PROTIME-INR
INR: 1.7 — ABNORMAL HIGH (ref 0.8–1.2)
Prothrombin Time: 19.8 s — ABNORMAL HIGH (ref 11.4–15.2)

## 2023-05-19 LAB — AMMONIA: Ammonia: 97 umol/L — ABNORMAL HIGH (ref 9–35)

## 2023-05-19 LAB — LIPASE, BLOOD: Lipase: 38 U/L (ref 11–51)

## 2023-05-19 MED ORDER — LACTULOSE 10 GM/15ML PO SOLN
10.0000 g | Freq: Once | ORAL | Status: AC
  Administered 2023-05-19: 10 g via ORAL
  Filled 2023-05-19: qty 30

## 2023-05-19 MED ORDER — ENOXAPARIN SODIUM 40 MG/0.4ML IJ SOSY
40.0000 mg | PREFILLED_SYRINGE | INTRAMUSCULAR | Status: DC
  Administered 2023-05-20: 40 mg via SUBCUTANEOUS
  Filled 2023-05-19 (×2): qty 0.4

## 2023-05-19 MED ORDER — LIDOCAINE-EPINEPHRINE (PF) 2 %-1:200000 IJ SOLN
10.0000 mL | Freq: Once | INTRAMUSCULAR | Status: AC
  Administered 2023-05-19: 10 mL via INTRADERMAL
  Filled 2023-05-19: qty 20

## 2023-05-19 MED ORDER — FUROSEMIDE 40 MG PO TABS
40.0000 mg | ORAL_TABLET | Freq: Every evening | ORAL | Status: DC
  Administered 2023-05-20 – 2023-05-21 (×2): 40 mg via ORAL
  Filled 2023-05-19 (×2): qty 1

## 2023-05-19 MED ORDER — PROCHLORPERAZINE EDISYLATE 10 MG/2ML IJ SOLN
5.0000 mg | Freq: Four times a day (QID) | INTRAMUSCULAR | Status: DC | PRN
  Administered 2023-05-19 – 2023-05-20 (×4): 5 mg via INTRAVENOUS
  Filled 2023-05-19 (×4): qty 2

## 2023-05-19 MED ORDER — PANTOPRAZOLE SODIUM 40 MG PO TBEC
40.0000 mg | DELAYED_RELEASE_TABLET | Freq: Every day | ORAL | Status: DC
  Administered 2023-05-20 – 2023-05-21 (×2): 40 mg via ORAL
  Filled 2023-05-19 (×2): qty 1

## 2023-05-19 MED ORDER — IOHEXOL 300 MG/ML  SOLN
100.0000 mL | Freq: Once | INTRAMUSCULAR | Status: AC | PRN
  Administered 2023-05-19: 100 mL via INTRAVENOUS

## 2023-05-19 MED ORDER — SPIRONOLACTONE 100 MG PO TABS
200.0000 mg | ORAL_TABLET | Freq: Two times a day (BID) | ORAL | Status: DC
Start: 2023-05-20 — End: 2023-05-23
  Administered 2023-05-20 – 2023-05-22 (×6): 200 mg via ORAL
  Filled 2023-05-19 (×2): qty 8
  Filled 2023-05-19: qty 2
  Filled 2023-05-19 (×2): qty 8
  Filled 2023-05-19 (×2): qty 2
  Filled 2023-05-19 (×2): qty 8
  Filled 2023-05-19 (×4): qty 2

## 2023-05-19 MED ORDER — SODIUM CHLORIDE 0.9% FLUSH
3.0000 mL | Freq: Two times a day (BID) | INTRAVENOUS | Status: DC
  Administered 2023-05-20 – 2023-05-24 (×8): 3 mL via INTRAVENOUS

## 2023-05-19 MED ORDER — FUROSEMIDE 40 MG PO TABS
80.0000 mg | ORAL_TABLET | Freq: Two times a day (BID) | ORAL | Status: DC
  Administered 2023-05-20 – 2023-05-23 (×7): 80 mg via ORAL
  Filled 2023-05-19 (×7): qty 2

## 2023-05-19 MED ORDER — IPRATROPIUM-ALBUTEROL 0.5-2.5 (3) MG/3ML IN SOLN
3.0000 mL | Freq: Once | RESPIRATORY_TRACT | Status: AC
  Administered 2023-05-19: 3 mL via RESPIRATORY_TRACT
  Filled 2023-05-19: qty 3

## 2023-05-19 MED ORDER — LACTULOSE 10 GM/15ML PO SOLN
30.0000 g | Freq: Three times a day (TID) | ORAL | Status: DC
  Administered 2023-05-20 – 2023-05-24 (×14): 30 g via ORAL
  Filled 2023-05-19 (×14): qty 45

## 2023-05-19 NOTE — ED Provider Notes (Signed)
[place] EMERGENCY DEPARTMENT AT [facility] Provider Note   CSN: [id number] Arrival date & time: 05/19/23  1505     History  Chief Complaint  Patient presents with   Altered Mental Status    Erik Howe is a 52 y.o. male history of cirrhosis, GERD, anxiety, chronic paracenteses by IR, currently on liver transplant list in [place] presented for altered mental status most present for 1 day.  According to sister patient had a paracentesis done 2 days ago and then yesterday patient woke up and has been continuously altered.  Patient is unsure of the year as he states it is "2 on 7" but does note that he is in [place] and does know his name.  Patient has not had any fevers but does note that he is slightly short of breath but is not coughing anything up.  Patient has any chest pain.  Patient denies any abdominal pain, nauseous vomiting, alcohol use, medication changes, tremors, dysuria.  Patient denies any weakness.  Home Medications Prior to Admission medications   Medication Sig Start Date End Date Taking? Authorizing Provider  albuterol (VENTOLIN HFA) 108 (90 Base) MCG/ACT inhaler Inhale 2 puffs into the lungs every 6 (six) hours as needed for shortness of breath 04/11/22  Yes   Baclofen 5 MG TABS Take 2 tablets (10 mg total) by mouth 3 (three) times daily. 02/02/23  Yes Drazek, Dawn, CRNP  cyclobenzaprine (FLEXERIL) 10 MG tablet Take 1 Tablet by mouth nightly at bedtime as needed for muscle spasms Patient taking differently: Take 10 mg by mouth at bedtime. 12/15/21  Yes   furosemide (LASIX) 80 MG tablet Take 1 tablet (80 mg total) by mouth 2 (two) times daily AND 0.5 tablets (40 mg total) every evening. 08/25/22  Yes Jenel Lucks, MD  lactulose (CHRONULAC) 10 GM/15ML solution Take 3.33 g by mouth 3 (three) times daily.   Yes [provider]  albuterol (VENTOLIN HFA) 108 (90 Base) MCG/ACT inhaler Inhale 2 puffs into the lungs every 6 (six) hours as  neededshortness of breath . Patient not taking: Reported on 05/19/2023 04/11/22   Verlon Au, MD  amitriptyline (ELAVIL) 10 MG tablet Take 1 tablet (10 mg total) by mouth at bedtime. Patient not taking: Reported on 05/19/2023 09/15/22     ammonium lactate (LAC-HYDRIN) 12 % lotion Apply topically as needed for dry skin. Twice a day Patient not taking: Reported on 05/19/2023 03/02/23     clobetasol ointment (TEMOVATE) 0.05 % Apply topically 2 (two) times a day. To affected area (right hand) 03/02/23     diphenhydrAMINE (SOMINEX) 25 MG tablet Take 50 mg by mouth daily as needed for sleep or itching.    [provider]  docusate sodium (COLACE) 100 MG capsule Take 200 mg by mouth daily as needed (constipation.).    [provider]  omeprazole (PRILOSEC) 20 MG capsule Take 1 capsule (20 mg total) by mouth daily. 08/25/22   Jenel Lucks, MD  ondansetron (ZOFRAN-ODT) 8 MG disintegrating tablet Take 1 tablet (8 mg total) by mouth 3 (three) times daily as needed for nausea. 06/07/22     potassium chloride SA (KLOR-CON M) 20 MEQ tablet Take 2 tablets (40 mEq total) by mouth daily. 09/26/22   Jenel Lucks, MD  silver sulfADIAZINE (SILVADENE) 1 % cream Apply topically 2 (two) times a day. To affected areas 03/02/23     spironolactone (ALDACTONE) 100 MG tablet Take 2 tablets (200 mg total) by mouth  2 (two) times daily. 09/08/22   Jenel Lucks, MD      Allergies    Tylenol [acetaminophen], Dust mite extract, Paroxetine, Pollen extract, and Tramadol    Review of Systems   Review of Systems  Physical Exam Updated Vital Signs BP 123/67   Pulse (!) 105   Temp 98.8 F (37.1 C) (Oral)   Resp 15 Comment: Simultaneous filing. User may not have seen previous data.  SpO2 99%  Physical Exam Vitals reviewed.  Constitutional:      General: He is not in acute distress. HENT:     Head: Normocephalic and atraumatic.  Eyes:     Extraocular Movements: Extraocular movements  intact.     Conjunctiva/sclera: Conjunctivae normal.     Pupils: Pupils are equal, round, and reactive to light.  Cardiovascular:     Rate and Rhythm: Normal rate and regular rhythm.     Pulses: Normal pulses.     Heart sounds: Normal heart sounds.     Comments: 2+ bilateral radial/dorsalis pedis pulses with regular rate Pulmonary:     Effort: Pulmonary effort is normal. No respiratory distress.     Breath sounds: Wheezing (Left side) present.  Abdominal:     Palpations: Abdomen is soft.     Tenderness: There is abdominal tenderness (Right upper quadrant). There is no guarding or rebound.     Comments: Ascites  Musculoskeletal:        General: Normal range of motion.     Cervical back: Normal range of motion and neck supple.     Comments: 5 out of 5 bilateral grip/leg extension strength  Skin:    General: Skin is warm and dry.     Capillary Refill: Capillary refill takes less than 2 seconds.     Coloration: Skin is jaundiced.     Comments: Xerosis Jaundiced  Neurological:     General: No focal deficit present.     Mental Status: He is alert.     Comments: Sensation intact in all 4 limbs  Psychiatric:     Comments: Does appear altered and is he will give answers that are extremely vague and direct Alert and oriented to self and state but is unsure of the year     ED Results / Procedures / Treatments   Labs (all labs ordered are listed, but only abnormal results are displayed) Labs Reviewed  CBC WITH DIFFERENTIAL/PLATELET - Abnormal; Notable for the following components:      Result Value   RBC 3.05 (*)    Hemoglobin 10.7 (*)    HCT 29.0 (*)    MCH 35.1 (*)    MCHC 36.9 (*)    Monocytes Absolute 1.1 (*)    Eosinophils Absolute 0.6 (*)    All other components within normal limits  COMPREHENSIVE METABOLIC PANEL - Abnormal; Notable for the following components:   Sodium 133 (*)    Glucose, Bld 102 (*)    Calcium 8.7 (*)    Albumin 3.1 (*)    Total Bilirubin 3.8 (*)     All other components within normal limits  URINALYSIS, ROUTINE W REFLEX MICROSCOPIC - Abnormal; Notable for the following components:   Color, Urine AMBER (*)    Hgb urine dipstick MODERATE (*)    All other components within normal limits  AMMONIA - Abnormal; Notable for the following components:   Ammonia 97 (*)    All other components within normal limits  PROTIME-INR - Abnormal; Notable for the following components:  Prothrombin Time 19.8 (*)    INR 1.7 (*)    All other components within normal limits  BODY FLUID CULTURE W GRAM STAIN  LIPASE, BLOOD  LACTATE DEHYDROGENASE, PLEURAL OR PERITONEAL FLUID  GLUCOSE, PLEURAL OR PERITONEAL FLUID  PROTEIN, PLEURAL OR PERITONEAL FLUID  ALBUMIN, PLEURAL OR PERITONEAL FLUID   HIV ANTIBODY (ROUTINE TESTING W REFLEX)  COMPREHENSIVE METABOLIC PANEL  CBC    EKG EKG Interpretation Date/Time:  Friday May 19 2023 15:35:01 EST Ventricular Rate:  95 PR Interval:  160 QRS Duration:  99 QT Interval:  387 QTC Calculation: 487 R Axis:   44  Text Interpretation: Sinus rhythm Low voltage, precordial leads Borderline prolonged QT interval Confirmed by Fulton Reek [phone]) on 05/19/2023 3:44:13 PM  Radiology CT ABDOMEN PELVIS W CONTRAST  Result Date: 05/19/2023 CLINICAL DATA:  Right upper quadrant pain.  History of cirrhosis. EXAM: CT ABDOMEN AND PELVIS WITH CONTRAST TECHNIQUE: Multidetector CT imaging of the abdomen and pelvis was performed using the standard protocol following bolus administration of intravenous contrast. RADIATION DOSE REDUCTION: This exam was performed according to the departmental dose-optimization program which includes automated exposure control, adjustment of the mA and/or kV according to patient size and/or use of iterative reconstruction technique. CONTRAST:  OMNIPAQUE IOHEXOL 300 MG/ML  SOLN COMPARISON:  03/10/2022 FINDINGS: Lower chest: No acute abnormality. Hepatobiliary: Shrunken, nodular appearance of the  liver compatible with cirrhosis. No focal liver lesion identified. TIPS in place. Prior cholecystectomy. No biliary dilatation. Pancreas: Unremarkable. No pancreatic ductal dilatation or surrounding inflammatory changes. Spleen: Mildly enlarged without focal abnormality. Adrenals/Urinary Tract: Adrenal glands are unremarkable. Kidneys are normal, without renal calculi, solid lesion, or hydronephrosis. Bladder is unremarkable. Stomach/Bowel: Small hiatal hernia. Stomach otherwise unremarkable. No evidence of bowel obstruction. Long segment mild colonic wall thickening of the ascending colon. The appendix is normal in appearance (series 2, image 46). Vascular/Lymphatic: Scattered aortic atherosclerosis. Mild upper abdominal varices. No lymphadenopathy. Reproductive: Prostate is unremarkable. Other: Moderate volume ascites.  No pneumoperitoneum. Musculoskeletal: Anasarca. Gynecomastia. No acute bony abnormality. IMPRESSION: 1. Cirrhosis with sequela of portal hypertension including moderate volume ascites, mild splenomegaly, and mild upper abdominal varices. TIPS in place. 2. Long segment mild colonic wall thickening of the ascending colon, which may reflect portal colopathy or a mild colitis. 3. Small hiatal hernia. 4. Aortic atherosclerosis (ICD10-I70.0). Electronically Signed   By: Duanne Guess D.O.   On: 05/19/2023 19:03   DG Chest Port 1 View  Result Date: 05/19/2023 CLINICAL DATA:  Altered mental status EXAM: PORTABLE CHEST 1 VIEW COMPARISON:  02/11/2018 FINDINGS: The heart size and mediastinal contours are within normal limits. Both lungs are clear. The visualized skeletal structures are unremarkable. IMPRESSION: No active disease. Electronically Signed   By: Duanne Guess D.O.   On: 05/19/2023 16:27   CT Head Wo Contrast  Result Date: 05/19/2023 CLINICAL DATA:  New onset confusion.  Known attic cirrhosis. EXAM: CT HEAD WITHOUT CONTRAST TECHNIQUE: Contiguous axial images were obtained from the  base of the skull through the vertex without intravenous contrast. RADIATION DOSE REDUCTION: This exam was performed according to the departmental dose-optimization program which includes automated exposure control, adjustment of the mA and/or kV according to patient size and/or use of iterative reconstruction technique. COMPARISON:  None Available. FINDINGS: Brain: No evidence of acute infarction, hemorrhage, hydrocephalus, extra-axial collection or mass lesion/mass effect. No significant white matter lesions are present. Deep brain nuclei are within normal limits. The ventricles are of normal size. No significant extraaxial fluid collection is  present. The brainstem and cerebellum are within normal limits. Midline structures are within normal limits. Vascular: No hyperdense vessel or unexpected calcification. Skull: Calvarium is intact. No focal lytic or blastic lesions are present. No significant extracranial soft tissue lesion is present. Sinuses/Orbits: The paranasal sinuses and mastoid air cells are clear. The globes and orbits are within normal limits. IMPRESSION: Normal CT appearance of the brain. Electronically Signed   By: Marin Roberts M.D.   On: 05/19/2023 16:25    Procedures Paracentesis  Date/Time: 05/19/2023 7:57 PM  Performed by: Netta Corrigan, PA-C Authorized by: Netta Corrigan, PA-C   Consent:    Consent obtained:  Verbal   Consent given by:  Patient   Risks, benefits, and alternatives were discussed: yes     Risks discussed:  Bleeding, bowel perforation, infection and pain Universal protocol:    Procedure explained and questions answered to patient or proxy's satisfaction: yes     Test results available: yes     Imaging studies available: yes     Patient identity confirmed:  Verbally with patient Pre-procedure details:    Procedure purpose:  Diagnostic   Preparation: Patient was prepped and draped in usual sterile fashion   Anesthesia:    Anesthesia method:   Local infiltration   Local anesthetic:  Lidocaine 2% WITH epi Procedure details:    Needle gauge:  18   Ultrasound guidance: yes     Puncture site:  L lower quadrant   Fluid removed amount:  20 cc   Fluid appearance:  Yellow   Dressing:  Adhesive bandage Post-procedure details:    Procedure completion:  Tolerated .Critical Care  Performed by: Netta Corrigan, PA-C Authorized by: Netta Corrigan, PA-C   Critical care provider statement:    Critical care time (minutes):  50   Critical care time was exclusive of:  Separately billable procedures and treating other patients   Critical care was necessary to treat or prevent imminent or life-threatening deterioration of the following conditions:  CNS failure or compromise and hepatic failure   Critical care was time spent personally by me on the following activities:  Blood draw for specimens, development of treatment plan with patient or surrogate, discussions with consultants, evaluation of patient's response to treatment, examination of patient, obtaining history from patient or surrogate, review of old charts, re-evaluation of patient's condition, ordering and review of radiographic studies, pulse oximetry, ordering and review of laboratory studies and ordering and performing treatments and interventions   I assumed direction of critical care for this patient from another provider in my specialty: no     Care discussed with: admitting provider       Medications Ordered in ED Medications  furosemide (LASIX) tablet 80 mg (has no administration in time range)    And  furosemide (LASIX) tablet 40 mg (has no administration in time range)  spironolactone (ALDACTONE) tablet 200 mg (has no administration in time range)  pantoprazole (PROTONIX) EC tablet 40 mg (has no administration in time range)  lactulose (CHRONULAC) 10 GM/15ML solution 30 g (has no administration in time range)  enoxaparin (LOVENOX) injection 40 mg (has no administration  in time range)  sodium chloride flush (NS) 0.9 % injection 3 mL (has no administration in time range)  prochlorperazine (COMPAZINE) injection 5 mg (5 mg Intravenous Given 05/19/23 2041)  ipratropium-albuterol (DUONEB) 0.5-2.5 (3) MG/3ML nebulizer solution 3 mL (3 mLs Nebulization Given 05/19/23 1628)  lactulose (CHRONULAC) 10 GM/15ML solution 10 g (10 g Oral  Given 05/19/23 1705)  iohexol (OMNIPAQUE) 300 MG/ML solution 100 mL (100 mLs Intravenous Contrast Given 05/19/23 1820)  lidocaine-EPINEPHrine (XYLOCAINE W/EPI) 2 %-1:200000 (PF) injection 10 mL (10 mLs Intradermal Given by Other 05/19/23 1950)    ED Course/ Medical Decision Making/ A&P                                 Medical Decision Making Amount and/or Complexity of Data Reviewed Labs: ordered. Radiology: ordered.  Risk Prescription drug management. Decision regarding hospitalization.   Marja Kays 52 y.o. presented today for AMS. Working DDx that I considered at this time includes, but not limited to, CVA, ICH, intracranial mass, critical dehydration, heptatic dysfunction, uremia, hypercarbia/hypoxia/COPD exacerbation, intoxication, endocrine abnormality, toxidrome, adrenal insufficiency, hypoglycemia/hyperglycemia, paraneoplastic process.  R/o DDx: CVA, ICH, intracranial mass, critical dehydration, uremia, hypercarbia/hypoxia/COPD exacerbation, intoxication, endocrine abnormality, toxidrome, adrenal insufficiency, hypoglycemia/hyperglycemia, paraneoplastic process: These work and that it however unlikely due to patient's history, physical exam, presentation, lab/imaging findings  Review of prior external notes: 04/11/2023 orders only  Unique Tests and My Interpretation:  CT Head wo Contrast: No acute changes CBC: Unremarkable CMP: Unremarkable, similar to previous UA: Unremarkable CXR: Unremarkable PT/INR: Similar to baseline Ammonia: 97 EKG: Rate, rhythm, axis, intervals all examined and without medically relevant  abnormality.  Borderline prolonged QT. ST segments without concerns for elevations CT abdomen pelvis with contrast: Cirrhosis present, possible mild colitis  Social Determinants of Health: none  Discussion with Independent Historian:  Sister  Discussion of Management of Tests:  Opyd, MD Hospitalist  Risk: High: hospitalization or escalation of hospital-level care  Risk Stratification Score: None  Staffed with Earlene Plater, MD  Plan: On exam patient was in no acute distress with stable vitals.  On exam patient does appear clearly altered and did have some right upper quadrant tenderness.  Patient does not have his gallbladder but given his tenderness we will get imaging to rule out portal venous thrombosis.  Patient did have wheezing on exam the left lung field and so breathing treatment last week as patient does endorse some shortness of breath.  Will obtain other labs and imaging as well for altered mental status including ammonia and PT/INR.  Will plan on patient to be admitted.  Patient's labs do show increased ammonia levels which would indicate possible hepatic encephalopathy.  Patient's kidney function is good and so we will give lactulose here.  Still waiting on CT imaging however do plan on admission still.  CT scan came back reassuring.  At this time patient is stable to be admitted for suspected hepatic encephalopathy.  Hospitalist were consulted.  I spoke to the hospitalist and patient was accepted for admission.  Attending went to go reevaluate the patient and patient endorsed vague abdominal pain again so we decided to do a diagnostic paracentesis in which we are able to draw yellow fluid.  Procedure was done in a sterile environment and there were no immediate complications.  Will send for lab and will admit to the hospitalist as patient is already been accepted.  This chart was dictated using voice recognition software.  Despite best efforts to proofread,  errors can occur which can  change the documentation meaning.         Final Clinical Impression(s) / ED Diagnoses Final diagnoses:  Hepatic encephalopathy (HCC)    Rx / DC Orders ED Discharge Orders     None  Mekel, [place] 05/19/23 2051    Laurence Spates, MD 05/19/23 2256

## 2023-05-19 NOTE — H&P (Signed)
History and Physical    Erik Howe MWN:[id number] DOB: 1971-01-20 DOA: 05/19/2023  PCP: [name], PA-C   Patient coming from: Home   Chief Complaint: AMS   HPI: Erik Howe is a 52 y.o. male with medical history significant for cirrhosis with ascites status post TIPS who presents with altered mental status.   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  He is being evaluated for transplant at [facility].  He was said to be in his usual state on 05/17/2023 but woke yesterday morning with confusion and lethargy which have persisted.   Patient complains of nausea and fatigue. He had reported mild abdominal pain earlier. He reports adherence with lactulose but does not believe he has had a BM in 4 days or so.    ED Course: Upon arrival to the ED, patient is found to be afebrile and saturating well on room air with stable blood pressure.  Labs are most notable for sodium 133, bilirubin 3.8, ammonia 97, normal WBC, and INR 1.7.  Patient was treated with lactulose and DuoNeb in the ED.  Diagnostic paracentesis is being performed.  Review of Systems:  All other systems reviewed and apart from HPI, are negative.  Past Medical History:  Diagnosis Date   Anxiety    Arthritis    Cellulitis 12/18/2012   left foot   Cirrhosis of liver (HCC)    Depression    GERD (gastroesophageal reflux disease)    Hypertension    no longer a problem   Obesity    Varicose veins     Past Surgical History:  Procedure Laterality Date   BIOPSY  08/25/2022   Procedure: BIOPSY;  Surgeon: Jenel Lucks, MD;  Location: [facility];  Service: Gastroenterology;;   CHOLECYSTECTOMY     COLONOSCOPY WITH PROPOFOL N/A 08/25/2022   Procedure: COLONOSCOPY WITH PROPOFOL;  Surgeon: Jenel Lucks, MD;  Location: [facility];  Service: Gastroenterology;  Laterality: N/A;   ENDOVENOUS ABLATION SAPHENOUS VEIN W/  LASER Left 09-30-2013   left greater saphenous vein   ESOPHAGOGASTRODUODENOSCOPY (EGD) WITH PROPOFOL N/A 08/25/2022   Procedure: ESOPHAGOGASTRODUODENOSCOPY (EGD) WITH PROPOFOL;  Surgeon: Jenel Lucks, MD;  Location: [facility];  Service: Gastroenterology;  Laterality: N/A;   HOT HEMOSTASIS N/A 08/25/2022   Procedure: HOT HEMOSTASIS (ARGON PLASMA COAGULATION/BICAP);  Surgeon: Jenel Lucks, MD;  Location: [facility];  Service: Gastroenterology;  Laterality: N/A;   IR IVUS EACH ADDITIONAL NON CORONARY VESSEL  07/20/2022   IR PARACENTESIS  05/03/2022   IR PARACENTESIS  05/18/2022   IR PARACENTESIS  06/02/2022   IR PARACENTESIS  06/10/2022   IR PARACENTESIS  06/24/2022   IR PARACENTESIS  06/30/2022   IR PARACENTESIS  07/08/2022   IR PARACENTESIS  07/15/2022   IR PARACENTESIS  07/20/2022   IR PARACENTESIS  07/29/2022   IR PARACENTESIS  08/12/2022   IR PARACENTESIS  08/19/2022   IR PARACENTESIS  09/06/2022   IR PARACENTESIS  09/22/2022   IR PARACENTESIS  10/21/2022   IR PARACENTESIS  11/17/2022   IR PARACENTESIS  12/07/2022   IR PARACENTESIS  12/21/2022   IR PARACENTESIS  01/06/2023   IR PARACENTESIS  01/23/2023   IR PARACENTESIS  02/23/2023   IR PARACENTESIS  03/09/2023   IR PARACENTESIS  03/31/2023   IR PARACENTESIS  05/01/2023   IR PARACENTESIS  05/16/2023   IR TIPS  07/20/2022   IR US GUIDE VASC ACCESS  RIGHT  07/20/2022   IR US GUIDE VASC ACCESS RIGHT  07/20/2022   POLYPECTOMY  08/25/2022   Procedure: POLYPECTOMY;  Surgeon: Jenel Lucks, MD;  Location: [facility];  Service: Gastroenterology;;   RADIOLOGY WITH ANESTHESIA N/A 07/20/2022   Procedure: TIPS;  Surgeon: Richarda Overlie, MD;  Location: [facility];  Service: Radiology;  Laterality: N/A;    Social History:   reports that he quit smoking about 10 years ago. His smoking use included cigarettes. He has never used smokeless tobacco. He reports that he does not drink alcohol and does not use drugs.  Allergies  Allergen Reactions   Tylenol  [Acetaminophen] Other (See Comments)    Told to avoid due to liver issues   Dust Mite Extract    Paroxetine Other (See Comments)    Stomach issues, "felt "wrong," and GI Intolerance   Pollen Extract    Tramadol Nausea Only    Family History  Problem Relation Age of Onset   Varicose Veins Mother    Heart disease Mother    Diabetes Mother    Arthritis Mother    Hypertension Mother    Stroke Father    Heart disease Father    Colon cancer Neg Hx    Stomach cancer Neg Hx    Rectal cancer Neg Hx    Esophageal cancer Neg Hx      Prior to Admission medications   Medication Sig Start Date End Date Taking? Authorizing Provider  albuterol (VENTOLIN HFA) 108 (90 Base) MCG/ACT inhaler Inhale 2 puffs into the lungs every 6 (six) hours as needed for shortness of breath 04/11/22     albuterol (VENTOLIN HFA) 108 (90 Base) MCG/ACT inhaler Inhale 2 puffs into the lungs every 6 (six) hours as neededshortness of breath . 04/11/22   Verlon Au, MD  amitriptyline (ELAVIL) 10 MG tablet Take 1 tablet (10 mg total) by mouth at bedtime. 09/15/22     ammonium lactate (LAC-HYDRIN) 12 % lotion Apply topically as needed for dry skin. Twice a day 03/02/23     Baclofen 5 MG TABS Take 2 tablets (10 mg total) by mouth 3 (three) times daily. 02/02/23   Drazek, Dawn, CRNP  clobetasol ointment (TEMOVATE) 0.05 % Apply topically 2 (two) times a day. To affected area (right hand) 03/02/23     cyclobenzaprine (FLEXERIL) 10 MG tablet Take 1 Tablet by mouth nightly at bedtime as needed for muscle spasms Patient taking differently: Take 10 mg by mouth at bedtime. 12/15/21     diphenhydrAMINE (SOMINEX) 25 MG tablet Take 50 mg by mouth daily as needed for sleep or itching.    [provider]  docusate sodium (COLACE) 100 MG capsule Take 200 mg by mouth daily as needed (constipation.).    [provider]  furosemide (LASIX) 80 MG tablet Take 1 tablet (80 mg total) by mouth 2 (two) times daily AND 0.5  tablets (40 mg total) every evening. 08/25/22   Jenel Lucks, MD  omeprazole (PRILOSEC) 20 MG capsule Take 1 capsule (20 mg total) by mouth daily. 08/25/22   Jenel Lucks, MD  ondansetron (ZOFRAN-ODT) 8 MG disintegrating tablet Take 1 tablet (8 mg total) by mouth 3 (three) times daily as needed for nausea. 06/07/22     potassium chloride SA (KLOR-CON M) 20 MEQ tablet Take 2 tablets (40 mEq total) by mouth daily. 09/26/22   Jenel Lucks, MD  silver sulfADIAZINE (SILVADENE) 1 % cream Apply topically 2 (two) times a day. To  affected areas 03/02/23     spironolactone (ALDACTONE) 100 MG tablet Take 2 tablets (200 mg total) by mouth 2 (two) times daily. 09/08/22   Jenel Lucks, MD    Physical Exam: Vitals:   05/19/23 1700 05/19/23 1730 05/19/23 1800 05/19/23 1817  BP: (!) 144/70 135/73 (!) 133/59   Pulse: 98 96 (!) 105 (!) 105  Resp: 15     Temp:      SpO2: 100% 98% 99% 97%    Constitutional: NAD, no pallor or diaphoresis   Eyes: PERTLA, lids and conjunctivae normal ENMT: Mucous membranes are moist. Posterior pharynx clear of any exudate or lesions.   Neck: supple, no masses  Respiratory: no wheezing, no crackles. No accessory muscle use.  Cardiovascular: S1 & S2 heard, regular rate and rhythm. Bilateral lower leg edema.  Abdomen: Distended, soft, no guarding or rebound pain. Bowel sounds active.  Musculoskeletal: no clubbing / cyanosis. No joint deformity upper and lower extremities.   Skin: no significant rashes, lesions, ulcers. Warm, dry, well-perfused. Neurologic: CN 2-12 grossly intact. Sensation to light touch intact. Moving all extremities. Asterixis noted. Awake, oriented to person and knows he is in a hospital.  Psychiatric: Calm. Cooperative.    Labs and Imaging on Admission: I have personally reviewed following labs and imaging studies  CBC: Recent Labs  Lab 05/19/23 1546  WBC 9.3  NEUTROABS 6.4  HGB 10.7*  HCT 29.0*  MCV 95.1  PLT 164   Basic  Metabolic Panel: Recent Labs  Lab 05/19/23 1546  NA 133*  K 4.0  CL 98  CO2 24  GLUCOSE 102*  BUN 19  CREATININE 1.08  CALCIUM 8.7*   GFR: CrCl cannot be calculated (Unknown ideal weight.). Liver Function Tests: Recent Labs  Lab 05/19/23 1546  AST 40  ALT 23  ALKPHOS 106  BILITOT 3.8*  PROT 6.6  ALBUMIN 3.1*   Recent Labs  Lab 05/19/23 1546  LIPASE 38   Recent Labs  Lab 05/19/23 1546  AMMONIA 97*   Coagulation Profile: Recent Labs  Lab 05/19/23 1546  INR 1.7*   Cardiac Enzymes: No results for input(s): "CKTOTAL", "CKMB", "CKMBINDEX", "TROPONINI" in the last 168 hours. BNP (last 3 results) No results for input(s): "PROBNP" in the last 8760 hours. HbA1C: No results for input(s): "HGBA1C" in the last 72 hours. CBG: No results for input(s): "GLUCAP" in the last 168 hours. Lipid Profile: No results for input(s): "CHOL", "HDL", "LDLCALC", "TRIG", "CHOLHDL", "LDLDIRECT" in the last 72 hours. Thyroid Function Tests: No results for input(s): "TSH", "T4TOTAL", "FREET4", "T3FREE", "THYROIDAB" in the last 72 hours. Anemia Panel: No results for input(s): "VITAMINB12", "FOLATE", "FERRITIN", "TIBC", "IRON", "RETICCTPCT" in the last 72 hours. Urine analysis:    Component Value Date/Time   COLORURINE AMBER (A) 05/19/2023 1524   APPEARANCEUR CLEAR 05/19/2023 1524   LABSPEC 1.018 05/19/2023 1524   PHURINE 5.0 05/19/2023 1524   GLUCOSEU NEGATIVE 05/19/2023 1524   HGBUR MODERATE (A) 05/19/2023 1524   BILIRUBINUR NEGATIVE 05/19/2023 1524   KETONESUR NEGATIVE 05/19/2023 1524   PROTEINUR NEGATIVE 05/19/2023 1524   NITRITE NEGATIVE 05/19/2023 1524   LEUKOCYTESUR NEGATIVE 05/19/2023 1524   Sepsis Labs: @LABRCNTIP (procalcitonin:4,lacticidven:4) )No results found for this or any previous visit (from the past 240 hour(s)).   Radiological Exams on Admission: CT ABDOMEN PELVIS W CONTRAST  Result Date: 05/19/2023 CLINICAL DATA:  Right upper quadrant pain.  History of  cirrhosis. EXAM: CT ABDOMEN AND PELVIS WITH CONTRAST TECHNIQUE: Multidetector CT imaging of the abdomen and pelvis  was performed using the standard protocol following bolus administration of intravenous contrast. RADIATION DOSE REDUCTION: This exam was performed according to the departmental dose-optimization program which includes automated exposure control, adjustment of the mA and/or kV according to patient size and/or use of iterative reconstruction technique. CONTRAST:  OMNIPAQUE IOHEXOL 300 MG/ML  SOLN COMPARISON:  03/10/2022 FINDINGS: Lower chest: No acute abnormality. Hepatobiliary: Shrunken, nodular appearance of the liver compatible with cirrhosis. No focal liver lesion identified. TIPS in place. Prior cholecystectomy. No biliary dilatation. Pancreas: Unremarkable. No pancreatic ductal dilatation or surrounding inflammatory changes. Spleen: Mildly enlarged without focal abnormality. Adrenals/Urinary Tract: Adrenal glands are unremarkable. Kidneys are normal, without renal calculi, solid lesion, or hydronephrosis. Bladder is unremarkable. Stomach/Bowel: Small hiatal hernia. Stomach otherwise unremarkable. No evidence of bowel obstruction. Long segment mild colonic wall thickening of the ascending colon. The appendix is normal in appearance (series 2, image 46). Vascular/Lymphatic: Scattered aortic atherosclerosis. Mild upper abdominal varices. No lymphadenopathy. Reproductive: Prostate is unremarkable. Other: Moderate volume ascites.  No pneumoperitoneum. Musculoskeletal: Anasarca. Gynecomastia. No acute bony abnormality. IMPRESSION: 1. Cirrhosis with sequela of portal hypertension including moderate volume ascites, mild splenomegaly, and mild upper abdominal varices. TIPS in place. 2. Long segment mild colonic wall thickening of the ascending colon, which may reflect portal colopathy or a mild colitis. 3. Small hiatal hernia. 4. Aortic atherosclerosis (ICD10-I70.0). Electronically Signed   By:  Duanne Guess D.O.   On: 05/19/2023 19:03   DG Chest Port 1 View  Result Date: 05/19/2023 CLINICAL DATA:  Altered mental status EXAM: PORTABLE CHEST 1 VIEW COMPARISON:  02/11/2018 FINDINGS: The heart size and mediastinal contours are within normal limits. Both lungs are clear. The visualized skeletal structures are unremarkable. IMPRESSION: No active disease. Electronically Signed   By: Duanne Guess D.O.   On: 05/19/2023 16:27   CT Head Wo Contrast  Result Date: 05/19/2023 CLINICAL DATA:  New onset confusion.  Known attic cirrhosis. EXAM: CT HEAD WITHOUT CONTRAST TECHNIQUE: Contiguous axial images were obtained from the base of the skull through the vertex without intravenous contrast. RADIATION DOSE REDUCTION: This exam was performed according to the departmental dose-optimization program which includes automated exposure control, adjustment of the mA and/or kV according to patient size and/or use of iterative reconstruction technique. COMPARISON:  None Available. FINDINGS: Brain: No evidence of acute infarction, hemorrhage, hydrocephalus, extra-axial collection or mass lesion/mass effect. No significant white matter lesions are present. Deep brain nuclei are within normal limits. The ventricles are of normal size. No significant extraaxial fluid collection is present. The brainstem and cerebellum are within normal limits. Midline structures are within normal limits. Vascular: No hyperdense vessel or unexpected calcification. Skull: Calvarium is intact. No focal lytic or blastic lesions are present. No significant extracranial soft tissue lesion is present. Sinuses/Orbits: The paranasal sinuses and mastoid air cells are clear. The globes and orbits are within normal limits. IMPRESSION: Normal CT appearance of the brain. Electronically Signed   By: Marin Roberts M.D.   On: 05/19/2023 16:25    EKG: Independently reviewed. Sinus rhythm.   Assessment/Plan   1. Hepatic encephalopathy  -  Unclear precipitant, diagnostic paracentesis being performed   - Continue lactulose, use delirium precautions, follow-up ascitic fluid studies    2. Cirrhosis  - Attributed to [facility], s/p TIPS in January, undergoing transplant evaluation at [facility]  - MELD Na is 22 on admission  - Continue diuretics, lactulose     DVT prophylaxis: Lovenox  Code Status: Full  Level of Care: Level of  care: Telemetry Family Communication: Sister at bedside   Disposition Plan:  Patient is from: home  Anticipated d/c is to: Home  Anticipated d/c date is: 05/21/23  Patient currently: Pending improved mental status  Consults called: None  Admission status: Inpatient     Briscoe Deutscher, MD [facility]  05/19/2023, 7:40 PM

## 2023-05-19 NOTE — ED Triage Notes (Signed)
Pt was seen here on Tuesday for a "drainage", pt has a hx of liver cirrhosis. Pts sister picked him up Wednesday and states that the pt was normal. Upon waking yesterday sister noticed that the pt was very confused, and speaking sentences that made no sense. Pt denies any pain, but does say that he is dizzy.

## 2023-05-19 NOTE — ED Provider Notes (Signed)
This is a 52 year old male history of GERD, hypertension, liver cirrhosis/NASH currently on liver transplant list presenting for altered mental status.  Per sister she went to get him for Thanksgiving.  He was last at his baseline at the latest Wednesday afternoon.  Since any send gradually worsening encephalopathy and confusion.  No falls.  Has not noted any headache.  No chest pain or shortness of breath or fevers as far she is aware.  He had a therapeutic paracentesis last week.  On my exam he knows his name could not, when asked where he is he gives me the date which is incorrect.  He is able to follow commands otherwise okay.  No gross coordination or asterixis.  He has no visual field cuts.  Extraocular movements and cranial nerves are intact.  He has full strength in bilateral upper and lower extremities.  No neglect or sensory deficits.  Abdomen slightly tender.  CT scan overall reassuring.  Lab work notable for significant hyperammonemia likely causing his symptoms.  We obtained consent from his sister for diagnostic paracentesis to rule out SBP.  Fluid obtained without complication.  Studies are pending.  Discussed with hospitalist and admitted.   Laurence Spates, MD 05/19/23 2256

## 2023-05-20 DIAGNOSIS — R188 Other ascites: Secondary | ICD-10-CM | POA: Diagnosis not present

## 2023-05-20 DIAGNOSIS — K7460 Unspecified cirrhosis of liver: Secondary | ICD-10-CM | POA: Diagnosis not present

## 2023-05-20 DIAGNOSIS — K7682 Hepatic encephalopathy: Secondary | ICD-10-CM | POA: Diagnosis not present

## 2023-05-20 LAB — CBC
HCT: 27 % — ABNORMAL LOW (ref 39.0–52.0)
HCT: 28.9 % — ABNORMAL LOW (ref 39.0–52.0)
Hemoglobin: 9 g/dL — ABNORMAL LOW (ref 13.0–17.0)
Hemoglobin: 10 g/dL — ABNORMAL LOW (ref 13.0–17.0)
MCH: 31.8 pg (ref 26.0–34.0)
MCH: 32.8 pg (ref 26.0–34.0)
MCHC: 33.3 g/dL (ref 30.0–36.0)
MCHC: 34.6 g/dL (ref 30.0–36.0)
MCV: 95.4 fL (ref 80.0–100.0)
MCV: 94.8 fL (ref 80.0–100.0)
Platelets: 135 10*3/uL — ABNORMAL LOW (ref 150–400)
Platelets: 147 10*3/uL — ABNORMAL LOW (ref 150–400)
RBC: 2.83 MIL/uL — ABNORMAL LOW (ref 4.22–5.81)
RBC: 3.05 MIL/uL — ABNORMAL LOW (ref 4.22–5.81)
RDW: 14.6 % (ref 11.5–15.5)
RDW: 14.7 % (ref 11.5–15.5)
WBC: 9.1 10*3/uL (ref 4.0–10.5)
WBC: 15.7 10*3/uL — ABNORMAL HIGH (ref 4.0–10.5)
nRBC: 0 % (ref 0.0–0.2)
nRBC: 0 % (ref 0.0–0.2)

## 2023-05-20 LAB — ALBUMIN, PLEURAL OR PERITONEAL FLUID: Albumin, Fluid: <1.5 g/dL

## 2023-05-20 LAB — BODY FLUID CELL COUNT WITH DIFFERENTIAL
Eos, Fluid: 0 %
Lymphs, Fluid: 73 %
Monocyte-Macrophage-Serous Fluid: 22 % — ABNORMAL LOW (ref 50–90)
Neutrophil Count, Fluid: 5 % (ref 0–25)
Total Nucleated Cell Count, Fluid: 147 uL (ref 0–1000)

## 2023-05-20 LAB — COMPREHENSIVE METABOLIC PANEL
ALT: 23 U/L (ref 0–44)
AST: 38 U/L (ref 15–41)
Albumin: 2.9 g/dL — ABNORMAL LOW (ref 3.5–5.0)
Alkaline Phosphatase: 86 U/L (ref 38–126)
Anion gap: 10 (ref 5–15)
BUN: 17 mg/dL (ref 6–20)
CO2: 22 mmol/L (ref 22–32)
Calcium: 8.4 mg/dL — ABNORMAL LOW (ref 8.9–10.3)
Chloride: 100 mmol/L (ref 98–111)
Creatinine, Ser: 1.12 mg/dL (ref 0.61–1.24)
GFR, Estimated: >60 mL/min (ref >60)
Glucose, Bld: 119 mg/dL — ABNORMAL HIGH (ref 70–99)
Potassium: 3.5 mmol/L (ref 3.5–5.1)
Sodium: 132 mmol/L — ABNORMAL LOW (ref 135–145)
Total Bilirubin: 4.6 mg/dL — ABNORMAL HIGH (ref <1.2)
Total Protein: 6.1 g/dL — ABNORMAL LOW (ref 6.5–8.1)

## 2023-05-20 LAB — GLUCOSE, PLEURAL OR PERITONEAL FLUID: Glucose, Fluid: 105 mg/dL

## 2023-05-20 LAB — PROTEIN, PLEURAL OR PERITONEAL FLUID: Total protein, fluid: <3 g/dL

## 2023-05-20 LAB — LACTATE DEHYDROGENASE, PLEURAL OR PERITONEAL FLUID: LD, Fluid: 27 U/L

## 2023-05-20 LAB — HIV ANTIBODY (ROUTINE TESTING W REFLEX): HIV Screen 4th Generation wRfx: NONREACTIVE

## 2023-05-20 MED ORDER — ALBUMIN HUMAN 25 % IV SOLN
25.0000 g | Freq: Once | INTRAVENOUS | Status: AC
  Administered 2023-05-20: 25 g via INTRAVENOUS
  Filled 2023-05-20: qty 100

## 2023-05-20 MED ORDER — FENTANYL CITRATE PF 50 MCG/ML IJ SOSY: 25.0000 ug | PREFILLED_SYRINGE | INTRAMUSCULAR | Status: DC | PRN

## 2023-05-20 MED ORDER — FENTANYL CITRATE PF 50 MCG/ML IJ SOSY
50.0000 ug | PREFILLED_SYRINGE | INTRAMUSCULAR | Status: DC | PRN
  Administered 2023-05-20 (×3): 50 ug via INTRAVENOUS
  Filled 2023-05-20 (×3): qty 1

## 2023-05-20 MED ORDER — ONDANSETRON HCL 4 MG/2ML IJ SOLN
4.0000 mg | Freq: Four times a day (QID) | INTRAMUSCULAR | Status: DC | PRN
  Administered 2023-05-20: 4 mg via INTRAVENOUS
  Filled 2023-05-20: qty 2

## 2023-05-20 MED ORDER — NALOXONE HCL 0.4 MG/ML IJ SOLN: 0.4000 mg | INTRAMUSCULAR | Status: DC | PRN

## 2023-05-20 NOTE — Progress Notes (Addendum)
PROGRESS NOTE  Erik Howe ZOX:[id number] DOB: April 23, 1971   PCP: [name], PA-C  Patient is from: Home.  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  Ammonia elevated to 97.  Reports compliance with lactulose but not sure about the dosage.  Underwent diagnostic paracentesis in ED that suggests portal hypertension.  Acetic fluid studies not consistent with SBP.  Patient was started on lactulose and admitted.  Subjective: Seen and examined earlier this morning.  No major events overnight of this morning.  Reports "a little" abdominal pain and nausea.  He is awake but not quite alert.  He is oriented to self but not able to tell me more.  He denies chest pain or shortness of breath.  Objective: Vitals:   05/19/23 2251 05/20/23 0532 05/20/23 0923 05/20/23 1101  BP: (!) 118/57 123/67  107/83  Pulse: 92 97  (!) 107  Resp:   14 18  Temp: 98.1 F (36.7 C) 98.1 F (36.7 C)    TempSrc: Oral Oral    SpO2: 98%     Weight: 129.9 kg 129.9 kg    Height: 6\' 3"  (1.905 m)       Examination:  GENERAL: No apparent distress.  Nontoxic. HEENT: MMM.  Vision and hearing grossly intact.  NECK: Supple.  No apparent JVD.  RESP:  No IWOB.  Fair aeration bilaterally. CVS:  RRR. Heart sounds normal.  ABD/GI/GU: BS+. Abd soft.  Mild diffuse tenderness.  Ascites. MSK/EXT:  Moves extremities. No apparent deformity.  1-2 BLE edema. SKIN: Dry skin NEURO: But not quite alert.  Oriented to self.  Follows commands.  No apparent focal neuro deficit. PSYCH: Calm. Normal affect.   Procedures:  11/29-diagnostic  paracentesis   Microbiology summarized: 11/29-peritoneal fluid culture pending  Assessment and plan: Acute hepatic encephalopathy: Reports compliance with lactulose but does not know how much she is taking.  Per his med list, it seems she is on 3.3 g 3 times daily which is very low.  Reportedly did not have a bowel movement in 4 days.  Ammonia was elevated to 97.  He is currently awake but not quite alert.  He is only oriented to self.  Barely able to tell me the place. -Continue lactulose 30 g 3 times daily -Decrease fentanyl. -Avoid or minimize sedating medications. -Fall and delirium precaution   NASH cirrhosis s/p TIPS in 06/2022: Still with recurrent ascites requiring periodic large-volume paracentesis.  Again, I suspect noncompliance with meds.  He is supposed to be on lactulose and Aldactone.  Underwent diagnostic paracentesis.  Fluid analysis suggest portal hypertension and negative for SBP.  Undergoing [facility] at [facility]. -Will order therapeutic paracentesis -Continue Lasix 80 mg twice daily with 40 mg in the evening -Continue Aldactone 200 mg daily -Lactulose as above  Anemia of chronic disease: Hgb dropped about 2 g.  No signs of overt bleeding.  BUN normal. Recent Labs    07/07/22 1354 07/20/22 0942 07/21/22 0411 09/01/22 1129 10/19/22 1213 10/21/22 1038 05/19/23 1546 05/20/23 0432  HGB 11.9* 11.3* 10.0* 11.4* 11.5* 11.3* 10.7* 9.0*  -Recheck CBC this afternoon  Thrombocytopenia: Mild -Monitor  Hyperbilirubinemia: Slightly worse today -Continue monitoring  Mood disorder/chronic pain: Seems to take some muscle relaxer on amitriptyline at home.  Med rec pending. -Hold p.o. meds for now.   Morbid obesity Body mass index is 35.79 kg/m.          DVT prophylaxis:  enoxaparin (LOVENOX) injection 40 mg Start: 05/20/23 1000  Code Status: Full code Family Communication: None at bedside Level of care: Telemetry Status is: Inpatient Remains  inpatient appropriate because: Acute hepatic encephalopathy   Final disposition: Likely home Consultants:  Interventional radiology  55 minutes with more than 50% spent in reviewing records, counseling patient/family and coordinating care.   Sch Meds:  Scheduled Meds:  enoxaparin (LOVENOX) injection  40 mg Subcutaneous Q24H   furosemide  80 mg Oral BID   And   furosemide  40 mg Oral QPM   lactulose  30 g Oral TID   pantoprazole  40 mg Oral Daily   sodium chloride flush  3 mL Intravenous Q12H   spironolactone  200 mg Oral BID   Continuous Infusions: PRN Meds:.fentaNYL (SUBLIMAZE) injection, naLOXone (NARCAN)  injection, ondansetron (ZOFRAN) IV, prochlorperazine  Antimicrobials: Anti-infectives (From admission, onward)    None        I have personally reviewed the following labs and images: CBC: Recent Labs  Lab 05/19/23 1546 05/20/23 0432  WBC 9.3 9.1  NEUTROABS 6.4  --   HGB 10.7* 9.0*  HCT 29.0* 27.0*  MCV 95.1 95.4  PLT 164 135*   BMP &GFR Recent Labs  Lab 05/19/23 1546 05/20/23 0432  NA 133* 132*  K 4.0 3.5  CL 98 100  CO2 24 22  GLUCOSE 102* 119*  BUN 19 17  CREATININE 1.08 1.12  CALCIUM 8.7* 8.4*   Estimated Creatinine Clearance: 112.1 mL/min (by C-G formula based on SCr of 1.12 mg/dL). Liver & Pancreas: Recent Labs  Lab 05/19/23 1546 05/20/23 0432  AST 40 38  ALT 23 23  ALKPHOS 106 86  BILITOT 3.8* 4.6*  PROT 6.6 6.1*  ALBUMIN 3.1* 2.9*   Recent Labs  Lab 05/19/23 1546  LIPASE 38   Recent Labs  Lab 05/19/23 1546  AMMONIA 97*   Diabetic: No results for input(s): "HGBA1C" in the last 72 hours. No results for input(s): "GLUCAP" in the last 168 hours. Cardiac Enzymes: No results for input(s): "CKTOTAL", "CKMB", "CKMBINDEX", "TROPONINI" in the last 168 hours. No results for input(s): "PROBNP" in the last 8760 hours. Coagulation Profile: Recent Labs  Lab 05/19/23 1546  INR 1.7*   Thyroid Function Tests: No results for  input(s): "TSH", "T4TOTAL", "FREET4", "T3FREE", "THYROIDAB" in the last 72 hours. Lipid Profile: No results for input(s): "CHOL", "HDL", "LDLCALC", "TRIG", "CHOLHDL", "LDLDIRECT" in the last 72 hours. Anemia Panel: No results for input(s): "VITAMINB12", "FOLATE", "FERRITIN", "TIBC", "IRON", "RETICCTPCT" in the last 72 hours. Urine analysis:    Component Value Date/Time   COLORURINE AMBER (A) 05/19/2023 1524   APPEARANCEUR CLEAR 05/19/2023 1524   LABSPEC 1.018 05/19/2023 1524   PHURINE 5.0 05/19/2023 1524   GLUCOSEU NEGATIVE 05/19/2023 1524   HGBUR MODERATE (A) 05/19/2023 1524   BILIRUBINUR NEGATIVE 05/19/2023 1524   KETONESUR NEGATIVE 05/19/2023 1524   PROTEINUR NEGATIVE 05/19/2023 1524   NITRITE NEGATIVE 05/19/2023 1524   LEUKOCYTESUR NEGATIVE 05/19/2023 1524   Sepsis Labs: Invalid input(s): "PROCALCITONIN", "LACTICIDVEN"  Microbiology: Recent Results (from the past 240 hour(s))  Body fluid culture w Gram Stain     Status: None (Preliminary result)   Collection Time: 05/19/23  8:02 PM   Specimen: Peritoneal Cavity;  Peritoneal Fluid  Result Value Ref Range Status   Specimen Description   Final    PERITONEAL CAVITY Performed at [facility], [address], [place], [place] [address]    Special Requests   Final    NONE Performed at [facility], [address] [place], [place] [address]    Gram Stain   Final    NO WBC SEEN NO ORGANISMS SEEN Performed at [facility], [address] [place], [place] [address]    Culture PENDING  Incomplete   Report Status PENDING  Incomplete    Radiology Studies: CT ABDOMEN PELVIS W CONTRAST  Result Date: 05/19/2023 CLINICAL DATA:  Right upper quadrant pain.  History of cirrhosis. EXAM: CT ABDOMEN AND PELVIS WITH CONTRAST TECHNIQUE: Multidetector CT imaging of the abdomen and pelvis was performed using the standard protocol following bolus administration of intravenous contrast. RADIATION  DOSE REDUCTION: This exam was performed according to the departmental dose-optimization program which includes automated exposure control, adjustment of the mA and/or kV according to patient size and/or use of iterative reconstruction technique. CONTRAST:  OMNIPAQUE IOHEXOL 300 MG/ML  SOLN COMPARISON:  03/10/2022 FINDINGS: Lower chest: No acute abnormality. Hepatobiliary: Shrunken, nodular appearance of the liver compatible with cirrhosis. No focal liver lesion identified. TIPS in place. Prior cholecystectomy. No biliary dilatation. Pancreas: Unremarkable. No pancreatic ductal dilatation or surrounding inflammatory changes. Spleen: Mildly enlarged without focal abnormality. Adrenals/Urinary Tract: Adrenal glands are unremarkable. Kidneys are normal, without renal calculi, solid lesion, or hydronephrosis. Bladder is unremarkable. Stomach/Bowel: Small hiatal hernia. Stomach otherwise unremarkable. No evidence of bowel obstruction. Long segment mild colonic wall thickening of the ascending colon. The appendix is normal in appearance (series 2, image 46). Vascular/Lymphatic: Scattered aortic atherosclerosis. Mild upper abdominal varices. No lymphadenopathy. Reproductive: Prostate is unremarkable. Other: Moderate volume ascites.  No pneumoperitoneum. Musculoskeletal: Anasarca. Gynecomastia. No acute bony abnormality. IMPRESSION: 1. Cirrhosis with sequela of portal hypertension including moderate volume ascites, mild splenomegaly, and mild upper abdominal varices. TIPS in place. 2. Long segment mild colonic wall thickening of the ascending colon, which may reflect portal colopathy or a mild colitis. 3. Small hiatal hernia. 4. Aortic atherosclerosis (ICD10-I70.0). Electronically Signed   By: Duanne Guess D.O.   On: 05/19/2023 19:03   DG Chest Port 1 View  Result Date: 05/19/2023 CLINICAL DATA:  Altered mental status EXAM: PORTABLE CHEST 1 VIEW COMPARISON:  02/11/2018 FINDINGS: The heart size and mediastinal  contours are within normal limits. Both lungs are clear. The visualized skeletal structures are unremarkable. IMPRESSION: No active disease. Electronically Signed   By: Duanne Guess D.O.   On: 05/19/2023 16:27   CT Head Wo Contrast  Result Date: 05/19/2023 CLINICAL DATA:  New onset confusion.  Known attic cirrhosis. EXAM: CT HEAD WITHOUT CONTRAST TECHNIQUE: Contiguous axial images were obtained from the base of the skull through the vertex without intravenous contrast. RADIATION DOSE REDUCTION: This exam was performed according to the departmental dose-optimization program which includes automated exposure control, adjustment of the mA and/or kV according to patient size and/or use of iterative reconstruction technique. COMPARISON:  None Available. FINDINGS: Brain: No evidence of acute infarction, hemorrhage, hydrocephalus, extra-axial collection or mass lesion/mass effect. No significant white matter lesions are present. Deep brain nuclei are within normal limits. The ventricles are of normal size. No significant extraaxial fluid collection is present. The brainstem and cerebellum are within normal limits. Midline structures are within normal limits. Vascular: No hyperdense vessel or unexpected calcification. Skull:  Calvarium is intact. No focal lytic or blastic lesions are present. No significant extracranial soft tissue lesion is present. Sinuses/Orbits: The paranasal sinuses and mastoid air cells are clear. The globes and orbits are within normal limits. IMPRESSION: Normal CT appearance of the brain. Electronically Signed   By: Marin Roberts M.D.   On: 05/19/2023 16:25      Kidada Ging T. Travonta Gill [facility]  If 7PM-7AM, please contact night-coverage [email or web address] 05/20/2023, 11:55 AM

## 2023-05-20 NOTE — Progress Notes (Addendum)
[facility] night cross cover note:   I was notified by RN that this patient is experiencing some breakthrough nausea/vomiting in spite of prn Compazine.  I subsequently ordered prn IV Zofran for refractory nausea/vomiting.    Update: pt is c/o of some abd discomfort. He underwent diagnostic.  Paracentesis in the ED earlier this evening.  I subsequently placed order for prn IV fentanyl to further address his abdominal pain.    Newton Pigg, DO Hospitalist

## 2023-05-20 NOTE — Plan of Care (Signed)
  Problem: Education: Goal: Knowledge of General Education information will improve Description: Including pain rating scale, medication(s)/side effects and non-pharmacologic comfort measures Outcome: Progressing   Problem: Clinical Measurements: Goal: Will remain free from infection Outcome: Progressing   Problem: Clinical Measurements: Goal: Diagnostic test results will improve Outcome: Progressing   Problem: Nutrition: Goal: Adequate nutrition will be maintained Outcome: Progressing   

## 2023-05-21 ENCOUNTER — Inpatient Hospital Stay (HOSPITAL_COMMUNITY): Payer: BLUE CROSS/BLUE SHIELD

## 2023-05-21 DIAGNOSIS — K7682 Hepatic encephalopathy: Secondary | ICD-10-CM | POA: Diagnosis not present

## 2023-05-21 DIAGNOSIS — K7460 Unspecified cirrhosis of liver: Secondary | ICD-10-CM | POA: Diagnosis not present

## 2023-05-21 DIAGNOSIS — R188 Other ascites: Secondary | ICD-10-CM | POA: Diagnosis not present

## 2023-05-21 LAB — COMPREHENSIVE METABOLIC PANEL
ALT: 21 U/L (ref 0–44)
AST: 31 U/L (ref 15–41)
Albumin: 2.5 g/dL — ABNORMAL LOW (ref 3.5–5.0)
Alkaline Phosphatase: 82 U/L (ref 38–126)
Anion gap: 7 (ref 5–15)
BUN: 24 mg/dL — ABNORMAL HIGH (ref 6–20)
CO2: 21 mmol/L — ABNORMAL LOW (ref 22–32)
Calcium: 8.1 mg/dL — ABNORMAL LOW (ref 8.9–10.3)
Chloride: 102 mmol/L (ref 98–111)
Creatinine, Ser: 1.3 mg/dL — ABNORMAL HIGH (ref 0.61–1.24)
GFR, Estimated: >60 mL/min (ref >60)
Glucose, Bld: 105 mg/dL — ABNORMAL HIGH (ref 70–99)
Potassium: 3.3 mmol/L — ABNORMAL LOW (ref 3.5–5.1)
Sodium: 130 mmol/L — ABNORMAL LOW (ref 135–145)
Total Bilirubin: 3.9 mg/dL — ABNORMAL HIGH (ref <1.2)
Total Protein: 5.4 g/dL — ABNORMAL LOW (ref 6.5–8.1)

## 2023-05-21 LAB — CBC
HCT: 25.9 % — ABNORMAL LOW (ref 39.0–52.0)
Hemoglobin: 8.8 g/dL — ABNORMAL LOW (ref 13.0–17.0)
MCH: 31.8 pg (ref 26.0–34.0)
MCHC: 34 g/dL (ref 30.0–36.0)
MCV: 93.5 fL (ref 80.0–100.0)
Platelets: 128 10*3/uL — ABNORMAL LOW (ref 150–400)
RBC: 2.77 MIL/uL — ABNORMAL LOW (ref 4.22–5.81)
RDW: 14.6 % (ref 11.5–15.5)
WBC: 20.9 10*3/uL — ABNORMAL HIGH (ref 4.0–10.5)
nRBC: 0 % (ref 0.0–0.2)

## 2023-05-21 LAB — PROTIME-INR
INR: 2.2 — ABNORMAL HIGH (ref 0.8–1.2)
Prothrombin Time: 24.4 s — ABNORMAL HIGH (ref 11.4–15.2)

## 2023-05-21 LAB — AMMONIA: Ammonia: 35 umol/L (ref 9–35)

## 2023-05-21 MED ORDER — LIDOCAINE HCL (PF) 1 % IJ SOLN
10.0000 mL | Freq: Once | INTRAMUSCULAR | Status: DC
Start: 2023-05-21 — End: 2023-05-24

## 2023-05-21 MED ORDER — ALUM & MAG HYDROXIDE-SIMETH 200-200-20 MG/5ML PO SUSP
30.0000 mL | Freq: Three times a day (TID) | ORAL | Status: DC | PRN
  Administered 2023-05-21 – 2023-05-22 (×2): 30 mL via ORAL
  Filled 2023-05-21 (×2): qty 30

## 2023-05-21 MED ORDER — SODIUM CHLORIDE 0.9 % IV SOLN
2.0000 g | INTRAVENOUS | Status: DC
  Administered 2023-05-21 – 2023-05-24 (×4): 2 g via INTRAVENOUS
  Filled 2023-05-21 (×4): qty 20

## 2023-05-21 MED ORDER — POTASSIUM CHLORIDE CRYS ER 20 MEQ PO TBCR
40.0000 meq | EXTENDED_RELEASE_TABLET | ORAL | Status: AC
  Administered 2023-05-21 (×2): 40 meq via ORAL
  Filled 2023-05-21 (×2): qty 2

## 2023-05-21 MED ORDER — PANTOPRAZOLE SODIUM 40 MG PO TBEC
40.0000 mg | DELAYED_RELEASE_TABLET | Freq: Two times a day (BID) | ORAL | Status: DC
  Administered 2023-05-21 – 2023-05-22 (×3): 40 mg via ORAL
  Filled 2023-05-21 (×3): qty 1

## 2023-05-21 NOTE — Progress Notes (Signed)
PROGRESS NOTE  Erik Howe:[id number] DOB: Jan 25, 1971   PCP: [name], PA-C  Patient is from: Home.  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  Ammonia elevated to 97.  Reports compliance with lactulose but not sure about the dosage.  Underwent diagnostic paracentesis in ED that suggests portal hypertension.  Acetic fluid studies not consistent with SBP.  Patient was started on lactulose and admitted.   Underwent therapeutic paracentesis on 12/1 with removal of 4 L.  Patient's encephalopathy improving with lactulose. Significant leukocytosis to 21 despite clinical improvement.  Unclear source of infection clinically.  Peritoneal fluid analysis not consistent with SBP.  Blood culture ordered.  Started on IV ceftriaxone.  Subjective: Seen and examined earlier this morning.  No major events overnight of this morning.  He had paracentesis with removal of 4.1 L.  He is awake and alert and oriented x 4 today.  Has no complaints.  Did not feel like he has bowel movements last night or this morning.  Ammonia level down to 35 but significant leukocytosis.   Objective: Vitals:   05/21/23 0750 05/21/23 0800 05/21/23 0820 05/21/23 1250  BP: 113/62 (!) 114/55 (!) 105/58 (!) 115/52  Pulse:    (!) 104  Resp:    16  Temp:    99.2 F (37.3 C)  TempSrc:    Oral  SpO2:    100%  Weight:      Height:        Examination:  GENERAL: No apparent distress.  Nontoxic. HEENT: MMM.  Vision and hearing grossly intact.  NECK: Supple.  No apparent JVD.  RESP:  No IWOB.  Fair aeration bilaterally. CVS:  RRR.  Heart sounds normal.  ABD/GI/GU: BS+. Abd soft.  Difficult exam due to body habitus MSK/EXT:  Moves extremities. No apparent deformity.  1-2 BLE edema. SKIN: Dry skin but no apparent lesion. NEURO: Awake and alert and oriented x 4.  No apparent focal neuro deficit. PSYCH: Calm. Normal affect.   Procedures:  11/29-diagnostic paracentesis 12/1-therapeutic paracentesis with removal of 4.1 L   Microbiology summarized: 11/29-peritoneal fluid culture pending  Assessment and plan: Acute hepatic encephalopathy: Reports compliance with lactulose and he is diuretics.  Ammonia was elevated to 97 on admission and improved to 35.  Encephalopathy seems to have resolved. -Continue lactulose 30 g 3 times daily -Avoid or minimize sedating medications. -Fall and delirium precaution   NASH cirrhosis with recurrent ascites even after TIPS in 06/2022: gets periodic large-volume paracentesis.  Undergoing transplant evaluation at the [facility]. MELD-Na score 27 with projected 27 to 32% 90-day mortality -S/p therapeutic paracentesis with removal of 4.1 L on 12/1. -Continue Lasix 80 mg twice daily with 40 mg in the evening -Continue Aldactone 200 mg daily -Lactulose as above  Leukocytosis: Significantly rising leukocytosis without fever or clear source of infection.  Low suspicion for SBP clinically.  Peritoneal fluid also not consistent with SBP.  Patient looks better clinically. -Check blood culture -Start ceftriaxone empirically -Continue monitoring  Anemia of chronic disease: Hgb dropped about 2 g.  No signs of overt bleeding.  Denies melena or hematochezia. Recent Labs    07/07/22 1354 07/20/22 9562  07/21/22 0411 09/01/22 1129 10/19/22 1213 10/21/22 1038 05/19/23 1546 05/20/23 0432 05/20/23 1405 05/21/23 0547  HGB 11.9* 11.3* 10.0* 11.4* 11.5* 11.3* 10.7* 9.0* 10.0* 8.8*  -R recheck CBC in the morning  Thrombocytopenia: Mild.  Likely due to liver cirrhosis. -Monitor  Hyperbilirubinemia:  Improved. -Continue monitoring   Mood disorder/chronic pain: Seems to take some muscle relaxer on amitriptyline at home.  Med rec pending. -Hold p.o. meds for now.  Hyponatremia: Likely hypervolemic in the setting of cirrhosis/ascites and high-dose Aldactone. -Continue monitoring  Hypokalemia -Monitor replenish as appropriate  Coagulopathy: INR 2.2.  Likely due to liver cirrhosis -Discontinue subcu Lovenox -SCD for VTE prophylaxis   Morbid obesity Body mass index is 35.79 kg/m.          DVT prophylaxis:  enoxaparin (LOVENOX) injection 40 mg Start: 05/20/23 1000  Code Status: Full code Family Communication: None at bedside Level of care: Telemetry Status is: Inpatient Remains inpatient appropriate because: Acute hepatic encephalopathy, significant leukocytosis   Final disposition: Likely home Consultants:  Interventional radiology  55 minutes with more than 50% spent in reviewing records, counseling patient/family and coordinating care.   Sch Meds:  Scheduled Meds:  enoxaparin (LOVENOX) injection  40 mg Subcutaneous Q24H   furosemide  80 mg Oral BID   And   furosemide  40 mg Oral QPM   lactulose  30 g Oral TID   lidocaine (PF)  10 mL Intradermal Once   pantoprazole  40 mg Oral Daily   sodium chloride flush  3 mL Intravenous Q12H   spironolactone  200 mg Oral BID   Continuous Infusions:  cefTRIAXone (ROCEPHIN)  IV 2 g (05/21/23 0937)   PRN Meds:.fentaNYL (SUBLIMAZE) injection, naLOXone (NARCAN)  injection, ondansetron (ZOFRAN) IV, prochlorperazine  Antimicrobials: Anti-infectives (From admission, onward)    Start     Dose/Rate Route Frequency Ordered Stop   05/21/23 0745  cefTRIAXone (ROCEPHIN) 2 g in sodium chloride 0.9 % 100 mL IVPB        2 g 200 mL/hr over 30 Minutes Intravenous Every 24 hours 05/21/23 0647 05/26/23 0744        I have personally reviewed the following labs and images: CBC: Recent Labs  Lab 05/19/23 1546 05/20/23 0432  05/20/23 1405 05/21/23 0547  WBC 9.3 9.1 15.7* 20.9*  NEUTROABS 6.4  --   --   --   HGB 10.7* 9.0* 10.0* 8.8*  HCT 29.0* 27.0* 28.9* 25.9*  MCV 95.1 95.4 94.8 93.5  PLT 164 135* 147* 128*   BMP &GFR Recent Labs  Lab 05/19/23 1546 05/20/23 0432 05/21/23 0547  NA 133* 132* 130*  K 4.0 3.5 3.3*  CL 98 100 102  CO2 24 22 21*  GLUCOSE 102* 119* 105*  BUN 19 17 24*  CREATININE 1.08 1.12 1.30*  CALCIUM 8.7* 8.4* 8.1*   Estimated Creatinine Clearance: 96.6 mL/min (A) (by C-G formula based on SCr of 1.3 mg/dL (H)). Liver & Pancreas: Recent Labs  Lab 05/19/23 1546 05/20/23 0432 05/21/23 0547  AST 40 38 31  ALT 23 23 21   ALKPHOS 106 86 82  BILITOT 3.8* 4.6* 3.9*  PROT 6.6 6.1* 5.4*  ALBUMIN 3.1* 2.9* 2.5*   Recent Labs  Lab 05/19/23 1546  LIPASE 38   Recent Labs  Lab 05/19/23 1546 05/21/23 1006  AMMONIA 97* 35   Diabetic: No results for input(s): "HGBA1C" in the last 72 hours. No results for input(s): "GLUCAP" in the last 168 hours. Cardiac Enzymes: No results for input(s): "CKTOTAL", "CKMB", "  CKMBINDEX", "TROPONINI" in the last 168 hours. No results for input(s): "PROBNP" in the last 8760 hours. Coagulation Profile: Recent Labs  Lab 05/19/23 1546 05/21/23 1006  INR 1.7* 2.2*   Thyroid Function Tests: No results for input(s): "TSH", "T4TOTAL", "FREET4", "T3FREE", "THYROIDAB" in the last 72 hours. Lipid Profile: No results for input(s): "CHOL", "HDL", "LDLCALC", "TRIG", "CHOLHDL", "LDLDIRECT" in the last 72 hours. Anemia Panel: No results for input(s): "VITAMINB12", "FOLATE", "FERRITIN", "TIBC", "IRON", "RETICCTPCT" in the last 72 hours. Urine analysis:    Component Value Date/Time   COLORURINE AMBER (A) 05/19/2023 1524   APPEARANCEUR CLEAR 05/19/2023 1524   LABSPEC 1.018 05/19/2023 1524   PHURINE 5.0 05/19/2023 1524   GLUCOSEU NEGATIVE 05/19/2023 1524   HGBUR MODERATE (A) 05/19/2023 1524   BILIRUBINUR NEGATIVE 05/19/2023 1524   KETONESUR NEGATIVE  05/19/2023 1524   PROTEINUR NEGATIVE 05/19/2023 1524   NITRITE NEGATIVE 05/19/2023 1524   LEUKOCYTESUR NEGATIVE 05/19/2023 1524   Sepsis Labs: Invalid input(s): "PROCALCITONIN", "LACTICIDVEN"  Microbiology: Recent Results (from the past 240 hour(s))  Body fluid culture w Gram Stain     Status: None (Preliminary result)   Collection Time: 05/19/23  8:02 PM   Specimen: Peritoneal Cavity; Peritoneal Fluid  Result Value Ref Range Status   Specimen Description   Final    PERITONEAL CAVITY Performed at [facility], [address], [place], [place] [address]    Special Requests   Final    NONE Performed at [facility], [address] [place], [place] [address]    Gram Stain NO WBC SEEN NO ORGANISMS SEEN   Final   Culture   Final    NO GROWTH 2 DAYS Performed at [facility], [address], [place], [place] [address]    Report Status PENDING  Incomplete    Radiology Studies: US Paracentesis  Result Date: 05/21/2023 INDICATION: Cirrhosis.  Recurrent ascites EXAM: ULTRASOUND GUIDED PARACENTESIS MEDICATIONS: 10 cc 1% lidocaine. COMPLICATIONS: None immediate. PROCEDURE: Informed written consent was obtained from the patient after a discussion of the risks, benefits and alternatives to treatment. A timeout was performed prior to the initiation of the procedure. Initial ultrasound scanning demonstrates a large amount of ascites within the right lower abdominal quadrant. The right lower abdomen was prepped and draped in the usual sterile fashion. 1% lidocaine was used for local anesthesia. Following this, a 7 cm Yueh catheter was introduced. An ultrasound image was saved for documentation purposes. The paracentesis was performed. The catheter was removed and a dressing was applied. The patient tolerated the procedure well without immediate post procedural complication. Patient received post-procedure intravenous albumin; see nursing notes for  details. FINDINGS: A total of approximately 4.1 Liters of serous fluid was removed. IMPRESSION: Successful ultrasound-guided paracentesis yielding 4.1 Liters liters of peritoneal fluid. Performed by Robet Leu [facility] Electronically Signed   By: Simonne Come M.D.   On: 05/21/2023 10:01      Euva Rundell T. Jasani Dolney [facility]  If 7PM-7AM, please contact night-coverage [email or web address] 05/21/2023, 2:10 PM

## 2023-05-21 NOTE — Procedures (Signed)
   IR Note  US guided RLQ paracentesis  4.1 L serous fluid removed No labs per MD  Tolerated well  EBL: scant

## 2023-05-21 NOTE — Evaluation (Signed)
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 Lives at home alone, in a single-level home with 1 steps to enter; Prior to admission, pt was able to manage independently - but during session he voiced concerns about his steadiness with ambulation; Presents to PT with generalized weakness and gait unsteadiness; Overall needing min assist to sit up to EOB, perform sit>stand, and walk short distance in room;  Pt currently with functional limitations due to the deficits listed below (see PT Problem List). Pt will benefit from skilled PT to increase their independence and safety with mobility to allow discharge to the venue listed below.           If plan is discharge home, recommend the following: Assistance with cooking/housework;A little help with walking and/or transfers;A little help with bathing/dressing/bathroom   Can travel by private vehicle        Equipment Recommendations Other (comment);Cane;Rolling walker (2 wheels) (RW vs cane)  Recommendations for Other Services  OT consult (as ordered)    Functional Status Assessment Patient has had a recent decline in their functional status and demonstrates the ability to make significant improvements in function in a reasonable and predictable amount of time.     Precautions / Restrictions Precautions Precautions: Fall Restrictions Weight Bearing Restrictions: No      Mobility  Bed Mobility Overal bed mobility: Needs Assistance Bed Mobility: Supine to Sit     Supine to  sit: Min assist     General bed mobility comments: Min handheld assist to pull to sit    Transfers Overall transfer level: Needs assistance Equipment used: 1 person hand held assist Transfers: Sit to/from Stand Sit to Stand: Min assist           General transfer comment: Cues to scoot closer to EOB to make standing easier    Ambulation/Gait Ambulation/Gait assistance: Contact guard assist Gait Distance (Feet): 12 Feet Assistive device: 1 person hand held assist (and a bit of furniture walking) Gait Pattern/deviations: Step-through pattern       General Gait Details: Reaches out for UE support; audible wheezing with activity  Stairs            Wheelchair Mobility     Tilt Bed    Modified Rankin (Stroke Patients Only)       Balance Overall balance assessment: Mild deficits observed, not formally tested                                           Pertinent Vitals/Pain Pain Assessment Pain Assessment: No/denies pain    Home Living Family/patient expects to be discharged to:: Private residence Living Arrangements: Alone Available Help at Discharge: Family;Friend(s);Other (Comment) (but sounds like assist is very infrequent) Type of Home: House Home Access: Stairs to enter   [employer] of Steps: 1   Home Layout: One level   Additional Comments: Will need more information re: bathroom setup    Prior Function Prior Level of Function : Independent/Modified Independent  Mobility Comments: Reports walks without an assistive device ADLs Comments: Reports Independence     Extremity/Trunk Assessment   Upper Extremity Assessment Upper Extremity Assessment: Defer to OT evaluation    Lower Extremity Assessment Lower Extremity Assessment: Generalized weakness    Cervical / Trunk Assessment Cervical / Trunk Assessment: Other exceptions Cervical / Trunk Exceptions: notable ascites/abdominal distention; Pt  reports getting para centeses chronically  Communication   Communication Communication: No apparent difficulties  Cognition Arousal: Alert Behavior During Therapy: WFL for tasks assessed/performed Overall Cognitive Status: Within Functional Limits for tasks assessed                                 General Comments: for simple moiblity activities        General Comments General comments (skin integrity, edema, etc.): Overall unkept appearance    Exercises     Assessment/Plan    PT Assessment Patient needs continued PT services  PT Problem List Decreased strength;Decreased activity tolerance;Decreased balance;Decreased mobility;Decreased coordination;Decreased cognition;Decreased knowledge of use of DME;Decreased safety awareness;Decreased knowledge of precautions;Cardiopulmonary status limiting activity       PT Treatment Interventions DME instruction;Gait training;Stair training;Therapeutic activities;Functional mobility training;Therapeutic exercise;Balance training;Cognitive remediation;Neuromuscular re-education;Patient/family education    PT Goals (Current goals can be found in the Care Plan section)  Acute Rehab PT Goals Patient Stated Goal: Wants to be home as soon as possible PT Goal Formulation: With patient Time For Goal Achievement: 06/04/23 Potential to Achieve Goals: Good    Frequency Min 1X/week     Co-evaluation               AM-PAC PT "6 Clicks" Mobility  Outcome Measure Help needed turning from your back to your side while in a flat bed without using bedrails?: A Little Help needed moving from lying on your back to sitting on the side of a flat bed without using bedrails?: A Little Help needed moving to and from a bed to a chair (including a wheelchair)?: A Little Help needed standing up from a chair using your arms (e.g., wheelchair or bedside chair)?: A Little Help needed to walk in hospital room?: A Little Help needed climbing 3-5  steps with a railing? : A Lot 6 Click Score: 17    End of Session Equipment Utilized During Treatment: Gait belt (at axillae) Activity Tolerance: Patient tolerated treatment well Patient left: in chair;with call bell/phone within reach;with chair alarm set Nurse Communication: Mobility status PT Visit Diagnosis: Unsteadiness on feet (R26.81);Other abnormalities of gait and mobility (R26.89);Muscle weakness (generalized) (M62.81)    Time: 6295-2841 PT Time Calculation (min) (ACUTE ONLY): 19 min   Charges:   PT Evaluation $PT Eval Moderate Complexity: 1 Mod   PT General Charges $$ ACUTE PT VISIT: 1 Visit         Van Clines, PT  Acute Rehabilitation Services Office [phone] Secure Chat welcomed   Levi Aland 05/21/2023, 4:44 PM

## 2023-05-21 NOTE — Plan of Care (Signed)

## 2023-05-22 DIAGNOSIS — R188 Other ascites: Secondary | ICD-10-CM | POA: Diagnosis not present

## 2023-05-22 DIAGNOSIS — K7682 Hepatic encephalopathy: Secondary | ICD-10-CM | POA: Diagnosis not present

## 2023-05-22 DIAGNOSIS — K7460 Unspecified cirrhosis of liver: Secondary | ICD-10-CM | POA: Diagnosis not present

## 2023-05-22 LAB — PROTIME-INR
INR: 2.1 — ABNORMAL HIGH (ref 0.8–1.2)
Prothrombin Time: 23.8 s — ABNORMAL HIGH (ref 11.4–15.2)

## 2023-05-22 LAB — CBC
HCT: 23.1 % — ABNORMAL LOW (ref 39.0–52.0)
HCT: 27.3 % — ABNORMAL LOW (ref 39.0–52.0)
Hemoglobin: 7.8 g/dL — ABNORMAL LOW (ref 13.0–17.0)
Hemoglobin: 9.4 g/dL — ABNORMAL LOW (ref 13.0–17.0)
MCH: 32 pg (ref 26.0–34.0)
MCH: 33.2 pg (ref 26.0–34.0)
MCHC: 33.8 g/dL (ref 30.0–36.0)
MCHC: 34.4 g/dL (ref 30.0–36.0)
MCV: 94.7 fL (ref 80.0–100.0)
MCV: 96.5 fL (ref 80.0–100.0)
Platelets: 103 10*3/uL — ABNORMAL LOW (ref 150–400)
Platelets: 136 10*3/uL — ABNORMAL LOW (ref 150–400)
RBC: 2.44 MIL/uL — ABNORMAL LOW (ref 4.22–5.81)
RBC: 2.83 MIL/uL — ABNORMAL LOW (ref 4.22–5.81)
RDW: 14.4 % (ref 11.5–15.5)
RDW: 14.6 % (ref 11.5–15.5)
WBC: 11.3 10*3/uL — ABNORMAL HIGH (ref 4.0–10.5)
WBC: 10.6 10*3/uL — ABNORMAL HIGH (ref 4.0–10.5)
nRBC: 0 % (ref 0.0–0.2)
nRBC: 0 % (ref 0.0–0.2)

## 2023-05-22 LAB — COMPREHENSIVE METABOLIC PANEL
ALT: 20 U/L (ref 0–44)
AST: 33 U/L (ref 15–41)
Albumin: 2.3 g/dL — ABNORMAL LOW (ref 3.5–5.0)
Alkaline Phosphatase: 75 U/L (ref 38–126)
Anion gap: 8 (ref 5–15)
BUN: 25 mg/dL — ABNORMAL HIGH (ref 6–20)
CO2: 23 mmol/L (ref 22–32)
Calcium: 7.8 mg/dL — ABNORMAL LOW (ref 8.9–10.3)
Chloride: 100 mmol/L (ref 98–111)
Creatinine, Ser: 1.19 mg/dL (ref 0.61–1.24)
GFR, Estimated: >60 mL/min (ref >60)
Glucose, Bld: 89 mg/dL (ref 70–99)
Potassium: 3.4 mmol/L — ABNORMAL LOW (ref 3.5–5.1)
Sodium: 131 mmol/L — ABNORMAL LOW (ref 135–145)
Total Bilirubin: 2.9 mg/dL — ABNORMAL HIGH (ref <1.2)
Total Protein: 4.7 g/dL — ABNORMAL LOW (ref 6.5–8.1)

## 2023-05-22 LAB — AMMONIA: Ammonia: 40 umol/L — ABNORMAL HIGH (ref 9–35)

## 2023-05-22 LAB — PHOSPHORUS: Phosphorus: 2.7 mg/dL (ref 2.5–4.6)

## 2023-05-22 LAB — MAGNESIUM: Magnesium: 1.6 mg/dL — ABNORMAL LOW (ref 1.7–2.4)

## 2023-05-22 MED ORDER — MAGNESIUM SULFATE 2 GM/50ML IV SOLN
2.0000 g | Freq: Once | INTRAVENOUS | Status: AC
  Administered 2023-05-22: 2 g via INTRAVENOUS
  Filled 2023-05-22: qty 50

## 2023-05-22 MED ORDER — HYDROCERIN EX CREA
TOPICAL_CREAM | Freq: Two times a day (BID) | CUTANEOUS | Status: DC
  Filled 2023-05-22: qty 113

## 2023-05-22 MED ORDER — POTASSIUM CHLORIDE CRYS ER 20 MEQ PO TBCR
40.0000 meq | EXTENDED_RELEASE_TABLET | Freq: Once | ORAL | Status: AC
  Administered 2023-05-22: 40 meq via ORAL
  Filled 2023-05-22: qty 2

## 2023-05-22 NOTE — Progress Notes (Signed)
PROGRESS NOTE  Erik Howe ZOX:[id number] DOB: 03-15-1971   PCP: [name], PA-C  Patient is from: Home.  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  Ammonia elevated to 97.  Reports compliance with lactulose but not sure about the dosage.  Underwent diagnostic paracentesis in ED that suggests portal hypertension.  Acetic fluid studies not consistent with SBP.  Patient was started on lactulose and admitted.   Underwent therapeutic paracentesis on 12/1 with removal of 4 L.  Patient's encephalopathy improving with lactulose. Significant leukocytosis to 21 despite clinical improvement.  Unclear source of infection clinically.  Peritoneal fluid analysis not consistent with SBP.  Blood culture ordered.  Started on IV ceftriaxone.  Blood cultures NGTD.  Leukocytosis improved.  Continues to improve clinically.  Subjective: Seen and examined earlier this morning.  No major events overnight of this morning.  No complaints.  Feels well.  No complaints.  Reports about 2-3 bowel movement in the last 24 hours.  Denies melena or hematochezia but stool looks dark that he attributes to lactulose.  Soft blood pressure but denies dizziness.  Patient's sister at bedside.  Objective: Vitals:   05/22/23 0045 05/22/23 0444 05/22/23 0450 05/22/23 0753  BP: (!) 103/49  (!) 92/51 (!) 107/53  Pulse: (!) 105  97 99  Resp: 18  18   Temp: 99.3 F (37.4 C)  99.3 F (37.4 C) 98.5 F (36.9 C)  TempSrc: Oral  Oral Oral  SpO2: 99%  100% 97%  Weight:  124.2 kg    Height:        Examination:  GENERAL: No apparent  distress.  Nontoxic. HEENT: MMM.  Vision and hearing grossly intact.  NECK: Supple.  No apparent JVD.  RESP:  No IWOB.  Fair aeration bilaterally. CVS:  RRR. Heart sounds normal.  ABD/GI/GU: BS+. Abd soft.  Difficult exam due to body habitus MSK/EXT:  Moves extremities. No apparent deformity.  1-2 BLE edema. SKIN: Dry skin but no apparent lesion. NEURO: Awake and alert and oriented x 4.  No apparent focal neuro deficit. PSYCH: Calm. Normal affect.   Procedures:  11/29-diagnostic paracentesis 12/1-therapeutic paracentesis with removal of 4.1 L   Microbiology summarized: 11/29-peritoneal fluid culture pending  Assessment and plan: Acute hepatic encephalopathy: Reports compliance with lactulose and he is diuretics.  Ammonia 97>>> 40..  Encephalopathy resolved.  Oriented x 4. -Continue lactulose 30 g 3 times daily -Avoid or minimize sedating medications. -Fall and delirium precaution   NASH cirrhosis with recurrent ascites even after TIPS in 06/2022: gets periodic large-volume paracentesis.  Undergoing transplant evaluation at [facility].. MELD-Na score 27 with projected 27 to 32% 90-day mortality -S/p therapeutic paracentesis with removal of 4.1 L on 12/1. -Continue Lasix 80 mg twice daily.  Discontinued the additional 40 mg in the evening. -Continue Aldactone 200 mg daily -Lactulose as above  Leukocytosis: WBC elevated to 20 on 12/1.  No fever.  SBP unlikely clinically.  Peritoneal fluid analysis negative as well.  Blood cultures NGTD.  Leukocytosis improved -Continue ceftriaxone for now -May need SBP prophylaxis moving forward -Continue monitoring  Anemia of chronic disease: Hgb dropped about 3 g.  No signs of overt bleeding.  Denies melena or hematochezia.  Due to liver cirrhosis.  INR is elevated to 2.1.   Recent Labs    07/20/22 0942 07/21/22 0411 09/01/22 1129 10/19/22 1213 10/21/22 1038 05/19/23 1546 05/20/23 0432 05/20/23 1405 05/21/23 0547 05/22/23 0724  HGB 11.3*  10.0* 11.4* 11.5* 11.3* 10.7* 9.0* 10.0* 8.8* 7.8*  -Check Hemoccult -Recheck CBC this afternoon -Increase PPI to twice daily -May consult GI if Hemoccult-H&H continues to drop  Thrombocytopenia: Likely due to liver cirrhosis. -Monitor -SCD for VTE prophylaxis  Hyperbilirubinemia: Improved. -Continue monitoring  Mood disorder/chronic pain: Seems to take some muscle relaxer on amitriptyline at home.  Med rec pending. -Hold p.o. meds for now.  Hyponatremia: Likely hypervolemic in the setting of cirrhosis/ascites and high-dose Aldactone. -Continue monitoring  Hypokalemia/hypomagnesemia -Monitor replenish as appropriate  Coagulopathy:  Likely due to liver cirrhosis -SCD for VTE prophylaxis -May consider vitamin K  Dry skin -Eucerin twice daily.   Morbid obesity Body mass index is 34.22 kg/m.          DVT prophylaxis:  Place and maintain sequential compression device Start: 05/21/23 1422  Code Status: Full code Family Communication: Updated patient's sister at bedside Level of care: Telemetry Status is: Inpatient Remains inpatient appropriate because: Acute hepatic encephalopathy, significant leukocytosis and anemia   Final disposition: Likely home Consultants:  Interventional radiology  55 minutes with more than 50% spent in reviewing records, counseling patient/family and coordinating care.   Sch Meds:  Scheduled Meds:  furosemide  80 mg Oral BID   lactulose  30 g Oral TID   lidocaine (PF)  10 mL Intradermal Once   pantoprazole  40 mg Oral BID   sodium chloride flush  3 mL Intravenous Q12H   spironolactone  200 mg Oral BID   Continuous Infusions:  cefTRIAXone (ROCEPHIN)  IV Stopped (05/22/23 0852)   PRN Meds:.alum & mag hydroxide-simeth, fentaNYL (SUBLIMAZE) injection, naLOXone (NARCAN)  injection, ondansetron (ZOFRAN) IV, prochlorperazine  Antimicrobials: Anti-infectives (From admission, onward)    Start     Dose/Rate Route Frequency Ordered Stop    05/21/23 0745  cefTRIAXone (ROCEPHIN) 2 g in sodium chloride 0.9 % 100 mL IVPB        2 g 200 mL/hr over 30 Minutes Intravenous Every 24 hours 05/21/23 0647 05/26/23 0744        I have personally reviewed the following labs and images: CBC: Recent Labs  Lab 05/19/23 1546 05/20/23 0432 05/20/23 1405 05/21/23 0547 05/22/23 0724  WBC 9.3 9.1 15.7* 20.9* 11.3*  NEUTROABS 6.4  --   --   --   --   HGB 10.7* 9.0* 10.0* 8.8* 7.8*  HCT 29.0* 27.0* 28.9* 25.9* 23.1*  MCV 95.1 95.4 94.8 93.5 94.7  PLT 164 135* 147* 128* 103*   BMP &GFR Recent Labs  Lab 05/19/23 1546 05/20/23 0432 05/21/23 0547 05/22/23 0724  NA 133* 132* 130* 131*  K 4.0 3.5 3.3* 3.4*  CL 98 100 102 100  CO2 24 22 21* 23  GLUCOSE 102* 119* 105* 89  BUN 19 17 24* 25*  CREATININE 1.08 1.12 1.30* 1.19  CALCIUM 8.7* 8.4* 8.1* 7.8*  MG  --   --   --  1.6*  PHOS  --   --   --  2.7   Estimated Creatinine Clearance: 103.1 mL/min (by C-G formula based on SCr of 1.19 mg/dL). Liver & Pancreas: Recent Labs  Lab 05/19/23 1546 05/20/23 0432 05/21/23 0547 05/22/23 0724  AST 40 38 31 33  ALT 23 23  21 20  ALKPHOS 106 86 82 75  BILITOT 3.8* 4.6* 3.9* 2.9*  PROT 6.6 6.1* 5.4* 4.7*  ALBUMIN 3.1* 2.9* 2.5* 2.3*   Recent Labs  Lab 05/19/23 1546  LIPASE 38   Recent Labs  Lab 05/19/23 1546 05/21/23 1006 05/22/23 0724  AMMONIA 97* 35 40*   Diabetic: No results for input(s): "HGBA1C" in the last 72 hours. No results for input(s): "GLUCAP" in the last 168 hours. Cardiac Enzymes: No results for input(s): "CKTOTAL", "CKMB", "CKMBINDEX", "TROPONINI" in the last 168 hours. No results for input(s): "PROBNP" in the last 8760 hours. Coagulation Profile: Recent Labs  Lab 05/19/23 1546 05/21/23 1006 05/22/23 0724  INR 1.7* 2.2* 2.1*   Thyroid Function Tests: No results for input(s): "TSH", "T4TOTAL", "FREET4", "T3FREE", "THYROIDAB" in the last 72 hours. Lipid Profile: No results for input(s): "CHOL", "HDL",  "LDLCALC", "TRIG", "CHOLHDL", "LDLDIRECT" in the last 72 hours. Anemia Panel: No results for input(s): "VITAMINB12", "FOLATE", "FERRITIN", "TIBC", "IRON", "RETICCTPCT" in the last 72 hours. Urine analysis:    Component Value Date/Time   COLORURINE AMBER (A) 05/19/2023 1524   APPEARANCEUR CLEAR 05/19/2023 1524   LABSPEC 1.018 05/19/2023 1524   PHURINE 5.0 05/19/2023 1524   GLUCOSEU NEGATIVE 05/19/2023 1524   HGBUR MODERATE (A) 05/19/2023 1524   BILIRUBINUR NEGATIVE 05/19/2023 1524   KETONESUR NEGATIVE 05/19/2023 1524   PROTEINUR NEGATIVE 05/19/2023 1524   NITRITE NEGATIVE 05/19/2023 1524   LEUKOCYTESUR NEGATIVE 05/19/2023 1524   Sepsis Labs: Invalid input(s): "PROCALCITONIN", "LACTICIDVEN"  Microbiology: Recent Results (from the past 240 hour(s))  Body fluid culture w Gram Stain     Status: None (Preliminary result)   Collection Time: 05/19/23  8:02 PM   Specimen: Peritoneal Cavity; Peritoneal Fluid  Result Value Ref Range Status   Specimen Description   Final    PERITONEAL CAVITY Performed at [facility], [address], [place], [place] [address]    Special Requests   Final    NONE Performed at [facility], [address] [place], [place] [address]    Gram Stain NO WBC SEEN NO ORGANISMS SEEN   Final   Culture   Final    NO GROWTH 3 DAYS Performed at [facility], [address], [place], [place] [address]    Report Status PENDING  Incomplete  Culture, blood (Routine X 2) w Reflex to ID Panel     Status: None (Preliminary result)   Collection Time: 05/21/23 10:06 AM   Specimen: BLOOD LEFT ARM  Result Value Ref Range Status   Specimen Description BLOOD LEFT ARM  Final   Special Requests   Final    BOTTLES DRAWN AEROBIC AND ANAEROBIC Blood Culture results may not be optimal due to an inadequate volume of blood received in culture bottles   Culture   Final    NO GROWTH < 24 HOURS Performed at [facility], [address] [place], [place] [address]    Report Status PENDING  Incomplete  Culture, blood (Routine X 2) w Reflex to ID Panel     Status: None (Preliminary result)   Collection Time: 05/21/23 10:14 AM   Specimen: BLOOD RIGHT HAND  Result Value Ref Range Status   Specimen Description BLOOD RIGHT HAND  Final   Special Requests   Final    BOTTLES DRAWN AEROBIC AND ANAEROBIC Blood Culture results may not be optimal due to an inadequate volume of blood received in culture bottles   Culture  Final    NO GROWTH < 24 HOURS Performed at [facility], [address], [place], [place] [address]    Report Status PENDING  Incomplete    Radiology Studies: No results found.    Jaleeya Mcnelly T. Laneice Meneely [facility]  If 7PM-7AM, please contact night-coverage [email or web address] 05/22/2023, 1:26 PM

## 2023-05-22 NOTE — Progress Notes (Signed)
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 Assessed without cane and parts of DGI completed with pt scoring near perfect on those elements assessed. No need for DME or  follow-up PT at this time.    If plan is discharge home, recommend the following: Assistance with cooking/housework   Can travel by [employer] walker (2 wheels);None recommended by PT    Recommendations for Other Services       Precautions / Restrictions Precautions Precautions: Fall Precaution Comments: denies h/o falls Restrictions Weight Bearing Restrictions: No     Mobility  Bed Mobility Overal bed mobility: Modified Independent Bed Mobility: Supine to Sit     Supine to sit: Modified independent (Device/Increase time), Used rails, HOB elevated     General bed mobility comments: stated he would need help; encouraged to try by himself and able to accomplish    Transfers Overall transfer level: Independent Equipment used: Straight cane, None Transfers: Sit to/from Stand Sit to Stand: Contact guard assist, Independent           General transfer comment: repeated x 7 with no imbalance and no asssist    Ambulation/Gait Ambulation/Gait assistance: Contact guard assist, Independent Gait Distance (Feet): 100 Feet (with cane; 300 no device) Assistive device: Straight cane, None (and a bit of furniture  walking) Gait Pattern/deviations: Step-through pattern   Gait velocity interpretation: 1.31 - 2.62 ft/sec, indicative of limited community ambulator   General Gait Details: with cane, pt carrying cane >50% of the time; with no device able to complete head turns, varying speed, turns, sudden stops without difficulty   Stairs             Wheelchair Mobility     Tilt Bed    Modified Rankin (Stroke Patients Only)       Balance Overall balance assessment: Independent                               Standardized Balance Assessment Standardized Balance Assessment : Dynamic Gait Index   Dynamic Gait Index Level Surface: Normal Change in Gait Speed: Mild Impairment Gait with Horizontal Head Turns: Normal Gait with Vertical Head Turns: Normal Gait and Pivot Turn: Normal Step Around Obstacles: Normal      Cognition Arousal: Alert Behavior During Therapy: WFL for tasks assessed/performed Overall Cognitive Status: Within Functional Limits for tasks assessed                                 General Comments: for simple moiblity activities        Exercises General Exercises - Lower Extremity Heel Raises: AROM, 10 reps, Seated Mini-Sqauts: AROM, 10 reps, Seated    General Comments General comments (skin integrity, edema, etc.): Sister present. Incr time waiting for nurse to come fix IV (bleeding at insertion site)      Pertinent Vitals/Pain  Pain Assessment Pain Assessment: No/denies pain    Home Living                          Prior Function            PT Goals (current goals can now be found in the care plan section) Acute Rehab PT Goals Patient Stated Goal: Wants to be home as soon as possible PT Goal Formulation: With patient Time For Goal Achievement: 06/04/23 Potential to Achieve Goals: Good Progress towards PT goals: Goals met/education completed, patient discharged from PT    Frequency           PT Plan       Co-evaluation              AM-PAC PT "6 Clicks" Mobility   Outcome Measure  Help needed turning from your back to your side while in a flat bed without using bedrails?: None Help needed moving from lying on your back to sitting on the side of a flat bed without using bedrails?: None Help needed moving to and from a bed to a chair (including a wheelchair)?: None Help needed standing up from a chair using your arms (e.g., wheelchair or bedside chair)?: None Help needed to walk in hospital room?: None Help needed climbing 3-5 steps with a railing? : None 6 Click Score: 24    End of Session Equipment Utilized During Treatment: Gait belt Activity Tolerance: Patient tolerated treatment well Patient left: in chair;with call bell/phone within reach;with family/visitor present;with nursing/sitter in room Nurse Communication: Mobility status PT Visit Diagnosis: Unsteadiness on feet (R26.81);Other abnormalities of gait and mobility (R26.89);Muscle weakness (generalized) (M62.81)   PT Discharge Note  Patient is being discharged from PT services secondary to:  Goals met and no further therapy needs identified.  Please see latest Therapy Progress Note for current level of functioning and progress toward goals.  Progress and discharge plan and discussed with patient/caregiver and they  Agree    Time: 0823-0903 PT Time Calculation (min) (ACUTE ONLY): 40 min  Charges:    $Gait Training: 23-37 mins PT General Charges $$ ACUTE PT VISIT: 1 Visit                      Jerolyn Center, PT Acute Rehabilitation Services  Office [phone]    Zena Amos 05/22/2023, 9:43 AM

## 2023-05-22 NOTE — Plan of Care (Signed)

## 2023-05-22 NOTE — Evaluation (Signed)
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 Per family member at bedside, pt's cognition is at his baseline. No OT needs.       If plan is discharge home, recommend the following: Supervision due to cognitive status (intermittent)    Functional Status Assessment  Patient has not had a recent decline in their functional status  Equipment Recommendations  None recommended by OT    Recommendations for Other Services       Precautions / Restrictions Precautions Precautions: None Precaution Comments: denies h/o falls Restrictions Weight Bearing Restrictions: No      Mobility Bed Mobility Overal bed mobility: Modified Independent                  Transfers Overall transfer level: Independent Equipment used: None               General transfer comment: has been routinely walking to the bathroom and sink      Balance                                           ADL either performed or assessed with clinical judgement   ADL Overall ADL's : Independent                                             Vision Ability to See in Adequate Light: 0 Adequate       Perception         Praxis         Pertinent Vitals/Pain Pain Assessment Pain Assessment: No/denies pain     Extremity/Trunk Assessment Upper Extremity Assessment Upper Extremity Assessment: Overall WFL for tasks assessed   Lower Extremity Assessment Lower  Extremity Assessment: Defer to PT evaluation   Cervical / Trunk Assessment Cervical / Trunk Assessment: Other exceptions Cervical / Trunk Exceptions: ascites   Communication Communication Communication: No apparent difficulties   Cognition Arousal: Alert Behavior During Therapy: WFL for tasks assessed/performed Overall Cognitive Status: Within Functional Limits for tasks assessed                                 General Comments: per family, who is bedside, pt is back to his baseline. He uses a pill box and timer on his phone to manage his medications.     General Comments  Sister present. Incr time waiting for nurse to come fix IV (bleeding at insertion site)    Exercises     Shoulder Instructions      Home Living Family/patient expects to be discharged to:: Private residence Living Arrangements: Alone Available Help at Discharge: Family;Friend(s);Other (Comment) (family calls him daily) Type of Home: House Home Access: Stairs to enter [employer] of Steps: 1   Home Layout: One level     Bathroom  Shower/Tub: Doctor, general practice: None          Prior Functioning/Environment Prior Level of Function : Independent/Modified Independent                        OT Problem List:        OT Treatment/Interventions:      OT Goals(Current goals can be found in the care plan section)    OT Frequency:      Co-evaluation              AM-PAC OT "6 Clicks" Daily Activity     Outcome Measure Help from another person eating meals?: None Help from another person taking care of personal grooming?: None Help from another person toileting, which includes using toliet, bedpan, or urinal?: None Help from another person bathing (including washing, rinsing, drying)?: None Help from another person to put on and taking off regular upper body clothing?: None Help from another person to put on and  taking off regular lower body clothing?: None 6 Click Score: 24   End of Session    Activity Tolerance: Patient tolerated treatment well Patient left: in bed;with call bell/phone within reach;with family/visitor present  OT Visit Diagnosis: Other symptoms and signs involving cognitive function                Time: 4098-1191 OT Time Calculation (min): 18 min Charges:  OT General Charges $OT Visit: 1 Visit OT Evaluation $OT Eval Low Complexity: 1 Low Berna Spare, OTR/L Acute Rehabilitation Services Office: [phone]  Evern Bio 05/22/2023, 11:06 AM

## 2023-05-23 ENCOUNTER — Other Ambulatory Visit: Payer: Self-pay

## 2023-05-23 ENCOUNTER — Other Ambulatory Visit (HOSPITAL_COMMUNITY): Payer: Self-pay

## 2023-05-23 DIAGNOSIS — R195 Other fecal abnormalities: Secondary | ICD-10-CM | POA: Diagnosis not present

## 2023-05-23 DIAGNOSIS — R188 Other ascites: Secondary | ICD-10-CM | POA: Diagnosis not present

## 2023-05-23 DIAGNOSIS — D649 Anemia, unspecified: Secondary | ICD-10-CM

## 2023-05-23 DIAGNOSIS — K7460 Unspecified cirrhosis of liver: Secondary | ICD-10-CM | POA: Diagnosis not present

## 2023-05-23 DIAGNOSIS — K7682 Hepatic encephalopathy: Secondary | ICD-10-CM | POA: Diagnosis not present

## 2023-05-23 LAB — CBC
HCT: 22.9 % — ABNORMAL LOW (ref 39.0–52.0)
HCT: 27.7 % — ABNORMAL LOW (ref 39.0–52.0)
Hemoglobin: 7.9 g/dL — ABNORMAL LOW (ref 13.0–17.0)
Hemoglobin: 9.2 g/dL — ABNORMAL LOW (ref 13.0–17.0)
MCH: 32.4 pg (ref 26.0–34.0)
MCH: 31.8 pg (ref 26.0–34.0)
MCHC: 34.5 g/dL (ref 30.0–36.0)
MCHC: 33.2 g/dL (ref 30.0–36.0)
MCV: 93.9 fL (ref 80.0–100.0)
MCV: 95.8 fL (ref 80.0–100.0)
Platelets: 105 10*3/uL — ABNORMAL LOW (ref 150–400)
Platelets: 121 10*3/uL — ABNORMAL LOW (ref 150–400)
RBC: 2.44 MIL/uL — ABNORMAL LOW (ref 4.22–5.81)
RBC: 2.89 MIL/uL — ABNORMAL LOW (ref 4.22–5.81)
RDW: 14.6 % (ref 11.5–15.5)
RDW: 14.5 % (ref 11.5–15.5)
WBC: 7.6 10*3/uL (ref 4.0–10.5)
WBC: 10.2 10*3/uL (ref 4.0–10.5)
nRBC: 0 % (ref 0.0–0.2)
nRBC: 0 % (ref 0.0–0.2)

## 2023-05-23 LAB — RESPIRATORY PANEL BY PCR

## 2023-05-23 LAB — COMPREHENSIVE METABOLIC PANEL
ALT: 26 U/L (ref 0–44)
AST: 42 U/L — ABNORMAL HIGH (ref 15–41)
Albumin: 2.3 g/dL — ABNORMAL LOW (ref 3.5–5.0)
Alkaline Phosphatase: 81 U/L (ref 38–126)
Anion gap: 7 (ref 5–15)
BUN: 20 mg/dL (ref 6–20)
CO2: 21 mmol/L — ABNORMAL LOW (ref 22–32)
Calcium: 7.8 mg/dL — ABNORMAL LOW (ref 8.9–10.3)
Chloride: 100 mmol/L (ref 98–111)
Creatinine, Ser: 1.38 mg/dL — ABNORMAL HIGH (ref 0.61–1.24)
GFR, Estimated: >60 mL/min (ref >60)
Glucose, Bld: 96 mg/dL (ref 70–99)
Potassium: 2.9 mmol/L — ABNORMAL LOW (ref 3.5–5.1)
Sodium: 128 mmol/L — ABNORMAL LOW (ref 135–145)
Total Bilirubin: 2.4 mg/dL — ABNORMAL HIGH (ref <1.2)
Total Protein: 5 g/dL — ABNORMAL LOW (ref 6.5–8.1)

## 2023-05-23 LAB — PROTIME-INR
INR: 2 — ABNORMAL HIGH (ref 0.8–1.2)
Prothrombin Time: 22.9 s — ABNORMAL HIGH (ref 11.4–15.2)

## 2023-05-23 LAB — BODY FLUID CULTURE W GRAM STAIN: Gram Stain: NONE SEEN

## 2023-05-23 LAB — OCCULT BLOOD X 1 CARD TO LAB, STOOL: Fecal Occult Bld: POSITIVE — AB

## 2023-05-23 LAB — MAGNESIUM: Magnesium: 1.7 mg/dL (ref 1.7–2.4)

## 2023-05-23 LAB — SARS CORONAVIRUS 2 BY RT PCR: SARS Coronavirus 2 by RT PCR: NEGATIVE

## 2023-05-23 MED ORDER — POTASSIUM CHLORIDE CRYS ER 20 MEQ PO TBCR
40.0000 meq | EXTENDED_RELEASE_TABLET | ORAL | Status: AC
  Administered 2023-05-23 (×2): 40 meq via ORAL
  Filled 2023-05-23 (×2): qty 2

## 2023-05-23 MED ORDER — FUROSEMIDE 40 MG PO TABS: 80.0000 mg | ORAL_TABLET | Freq: Every day | ORAL | Status: DC

## 2023-05-23 MED ORDER — SPIRONOLACTONE 25 MG PO TABS
200.0000 mg | ORAL_TABLET | Freq: Every day | ORAL | Status: DC
Start: 2023-05-23 — End: 2023-05-24
  Administered 2023-05-23 – 2023-05-24 (×2): 200 mg via ORAL
  Filled 2023-05-23 (×2): qty 8

## 2023-05-23 MED ORDER — TORSEMIDE 20 MG PO TABS
40.0000 mg | ORAL_TABLET | Freq: Every day | ORAL | Status: DC
  Administered 2023-05-23 – 2023-05-24 (×2): 40 mg via ORAL
  Filled 2023-05-23 (×2): qty 2

## 2023-05-23 MED ORDER — PANTOPRAZOLE SODIUM 40 MG IV SOLR
40.0000 mg | Freq: Two times a day (BID) | INTRAVENOUS | Status: DC
  Administered 2023-05-23 (×2): 40 mg via INTRAVENOUS
  Filled 2023-05-23 (×2): qty 10

## 2023-05-23 NOTE — Consult Note (Signed)
Consultation  Referring Provider: Dr. Alanda Slim    Primary Care Physician:  [name], PA-C Primary Gastroenterologist:   Dr. Tomasa Rand      Reason for Consultation:     History of decompensated MASH cirrhosis, anemia         HPI:   Erik Howe is a 52 y.o. male with a past medical history as listed below including decompensated NASH cirrhosis with ascites status post TIPS (who follows with [facility]) who presented to the hospital on 05/19/2023 with altered mental status.    At admission patient noted to have a history of liver cirrhosis attributed to [facility] and continue to require periodic large-volume paracentesis despite TIPS in January 2024.  He is currently being evaluated for transplant at [facility].  In his usual state on 11/27 but woke on the morning of 11/28 with confusion and lethargy which continued.  At presentation also complained of nausea and fatigue with mild abdominal pain.  He has been on Lactulose but had not had a bowel movement in 4 days.    05/22/2023 patient seen by hospitalist and had had 2-3 bowel movements in the last 24 hours, was feeling well.  Noted to have a decrease in hemoglobin for which we were consulted.    Today, the patient explains that he feels much better since being admitted to the hospital.  He is really unaware of his hemoglobin numbers and has not been told that he is anemic before.  Tells me he is having 2-3 bowel movements a day with his Lactulose and does not note any bright red blood or black tarry stools.      His biggest concern to me while I am with him is of a sore throat.  Apparently he has had a sore throat since Friday at time of admission 11/29 and even just drinking water hurts on the way down.  Tells me he has chronic reflux and is taking Omeprazole at home 40 mg daily.  Denies any other symptoms including fever, chills or headache with his sore throat.  He would really like this to get better so that he can eat or drink a soda.     Denies fever, chills or weight loss.  ER course: Sodium 133, bilirubin 3.8, ammonia 97, INR 1.7, treated with Lactulose and DuoNeb, diagnostic paracentesis ordered  Hospital course: 05/21/2023 ultrasound-guided paracentesis removal of 4.1 L of fluid 05/19/2023 CT abdomen pelvis with cirrhosis with sequela of portal hypertension including moderate volume ascites, mild splenomegaly and mild upper abdominal varices, TIPS in place, long segment mild colonic wall thickening of the ascending colon which may reflect portal colopathy or mild colitis, small hiatal hernia  GI history: 02/02/2023 office visit with Annamarie Major at that time noted to have decompensated Elita Boone cirrhosis, 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, at that time noted to be on Lactulose 30 3 times daily with varying diarrhea versus days with no bowel movements, unable to obtain Rifaximin due to insurance limitations, undergoing transplant valuation, there was concern regarding social support; at that time placed a standing order for large-volume paracentesis every 3 weeks with albumin replacement 09/08/2022 office visit for refractory ascites that time continued on Lactulose 45 mL 3 times daily but still having issues with constipation and memory, Rifaximin has been ordered and was awaiting PA approval, referred to [facility] with initial appointment in May; plan: Increase Aldactone to 200 mg twice daily, Lasix 80 mg twice daily, schedule therapy (2  weeks, follow-up with [facility], increase Lactulose to 60 mL 3 times daily and use Rifaximin when approved, continue once daily Omeprazole 08/25/2022 colonoscopy with 7 polyps removed throughout the colon, Hemoclip from previous polypectomy in the rectum with mucosa suspicious for remnant adenoma was biopsied, single colonic angiodysplastic lesion treated with APC, not all of the cecum was visualized and the appendiceal orifice was never definitively  identified, at that time noted refractory ascites is likely contributing to the technical difficulty of the procedure; recommended repeat colonoscopy in 9 to 12 months for surveillance after ascites is under better control, at that time are likely amended genetic counseling for Fab or other polyposis syndrome, patient was to be started on Rifaximin 550 twice daily and increase Lactulose to 4 times daily as well as Lasix to 80 twice daily 08/25/2022 EGD with grade 1 esophageal varices which did not require banding or beta-blocker prophylaxis, 5 cm hiatal hernia, portal hypertensive gastropathy, focal granular mucosa in the second portion of duodenum and duodenal spine from previous ulceration, at that time recommended repeat EGD in 1 year for variceal surveillance  Past Medical History:  Diagnosis Date   Anxiety    Arthritis    Cellulitis 12/18/2012   left foot   Cirrhosis of liver (HCC)    Depression    GERD (gastroesophageal reflux disease)    Hypertension    no longer a problem   Obesity    Varicose veins     Past Surgical History:  Procedure Laterality Date   BIOPSY  08/25/2022   Procedure: BIOPSY;  Surgeon: Jenel Lucks, MD;  Location: [facility];  Service: Gastroenterology;;   CHOLECYSTECTOMY     COLONOSCOPY WITH PROPOFOL N/A 08/25/2022   Procedure: COLONOSCOPY WITH PROPOFOL;  Surgeon: Jenel Lucks, MD;  Location: [facility];  Service: Gastroenterology;  Laterality: N/A;   ENDOVENOUS ABLATION SAPHENOUS VEIN W/ LASER Left 09-30-2013   left greater saphenous vein   ESOPHAGOGASTRODUODENOSCOPY (EGD) WITH PROPOFOL N/A 08/25/2022   Procedure: ESOPHAGOGASTRODUODENOSCOPY (EGD) WITH PROPOFOL;  Surgeon: Jenel Lucks, MD;  Location: [facility];  Service: Gastroenterology;  Laterality: N/A;   HOT HEMOSTASIS N/A 08/25/2022   Procedure: HOT HEMOSTASIS (ARGON PLASMA COAGULATION/BICAP);  Surgeon: Jenel Lucks, MD;  Location: [facility];  Service: Gastroenterology;   Laterality: N/A;   IR IVUS EACH ADDITIONAL NON CORONARY VESSEL  07/20/2022   IR PARACENTESIS  05/03/2022   IR PARACENTESIS  05/18/2022   IR PARACENTESIS  06/02/2022   IR PARACENTESIS  06/10/2022   IR PARACENTESIS  06/24/2022   IR PARACENTESIS  06/30/2022   IR PARACENTESIS  07/08/2022   IR PARACENTESIS  07/15/2022   IR PARACENTESIS  07/20/2022   IR PARACENTESIS  07/29/2022   IR PARACENTESIS  08/12/2022   IR PARACENTESIS  08/19/2022   IR PARACENTESIS  09/06/2022   IR PARACENTESIS  09/22/2022   IR PARACENTESIS  10/21/2022   IR PARACENTESIS  11/17/2022   IR PARACENTESIS  12/07/2022   IR PARACENTESIS  12/21/2022   IR PARACENTESIS  01/06/2023   IR PARACENTESIS  01/23/2023   IR PARACENTESIS  02/23/2023   IR PARACENTESIS  03/09/2023   IR PARACENTESIS  03/31/2023   IR PARACENTESIS  05/01/2023   IR PARACENTESIS  05/16/2023   IR TIPS  07/20/2022   IR US GUIDE VASC ACCESS RIGHT  07/20/2022   IR US GUIDE VASC ACCESS RIGHT  07/20/2022   POLYPECTOMY  08/25/2022   Procedure: POLYPECTOMY;  Surgeon: Jenel Lucks, MD;  Location: [facility];  Service: Gastroenterology;;   RADIOLOGY WITH ANESTHESIA N/A 07/20/2022   Procedure: TIPS;  Surgeon: Richarda Overlie, MD;  Location: [facility];  Service: Radiology;  Laterality: N/A;    Family History  Problem Relation Age of Onset   Varicose Veins Mother    Heart disease Mother    Diabetes Mother    Arthritis Mother    Hypertension Mother    Stroke Father    Heart disease Father    Colon cancer Neg Hx    Stomach cancer Neg Hx    Rectal cancer Neg Hx    Esophageal cancer Neg Hx    Social History   Tobacco Use   Smoking status: Former    Current packs/day: 0.00    Types: Cigarettes    Quit date: 09/18/2012    Years since quitting: 10.6   Smokeless tobacco: Never  Vaping Use   Vaping status: Never Used  Substance Use Topics   Alcohol use: No   Drug use: No    Prior to Admission medications   Medication Sig Start Date End Date Taking? Authorizing Provider  albuterol  (VENTOLIN HFA) 108 (90 Base) MCG/ACT inhaler Inhale 2 puffs into the lungs every 6 (six) hours as needed for shortness of breath 04/11/22     amitriptyline (ELAVIL) 10 MG tablet Take 1 tablet (10 mg total) by mouth at bedtime. 09/15/22     ammonium lactate (LAC-HYDRIN) 12 % lotion Apply topically as needed for dry skin. Twice a day 03/02/23     Baclofen 5 MG TABS Take 2 tablets (10 mg total) by mouth 3 (three) times daily. 02/02/23   Drazek, Dawn, CRNP  bismuth subsalicylate (PEPTO BISMOL) 262 MG/15ML suspension Take 30 mLs by mouth every 6 (six) hours as needed for indigestion.    [provider]  clobetasol ointment (TEMOVATE) 0.05 % Apply topically 2 (two) times a day. To affected area (right hand) 03/02/23     cyclobenzaprine (FLEXERIL) 10 MG tablet Take 1 Tablet by mouth nightly at bedtime as needed for muscle spasms 12/15/21     diphenhydrAMINE (BENADRYL) 50 MG tablet Take 50 mg by mouth every 8 (eight) hours as needed for itching or allergies.    [provider]  diphenhydrAMINE (SOMINEX) 25 MG tablet Take 50 mg by mouth daily as needed for sleep or itching.    [provider]  docusate sodium (COLACE) 100 MG capsule Take 200 mg by mouth daily as needed (constipation.).    [provider]  furosemide (LASIX) 80 MG tablet Take 1 tablet (80 mg total) by mouth 2 (two) times daily AND 0.5 tablets (40 mg total) every evening. 08/25/22   Jenel Lucks, MD  IVERMECTIN PO Take 3 mg by mouth.    [provider]  lactulose (CHRONULAC) 10 GM/15ML solution Take 3.33 g by mouth 3 (three) times daily.    [provider]  omeprazole (PRILOSEC) 20 MG capsule Take 1 capsule (20 mg total) by mouth daily. 08/25/22   Jenel Lucks, MD  ondansetron (ZOFRAN-ODT) 8 MG disintegrating tablet Take 1 tablet (8 mg total) by mouth 3 (three) times daily as needed for nausea. 06/07/22     potassium chloride SA (KLOR-CON M) 20 MEQ tablet Take 2 tablets (40 mEq total) by  mouth daily. 09/26/22   Jenel Lucks, MD  silver sulfADIAZINE (SILVADENE) 1 % cream Apply topically 2 (two) times a day. To affected areas 03/02/23     spironolactone (ALDACTONE) 100 MG tablet Take 2 tablets (  200 mg total) by mouth 2 (two) times daily. 09/08/22   Jenel Lucks, MD    Current Facility-Administered Medications  Medication Dose Route Frequency Provider Last Rate Last Admin   alum & mag hydroxide-simeth (MAALOX/MYLANTA) 200-200-20 MG/5ML suspension 30 mL  30 mL Oral Q8H PRN Candelaria Stagers T, MD   30 mL at 05/22/23 0823   cefTRIAXone (ROCEPHIN) 2 g in sodium chloride 0.9 % 100 mL IVPB  2 g Intravenous Q24H Candelaria Stagers T, MD 200 mL/hr at 05/23/23 0842 2 g at 05/23/23 0842   fentaNYL (SUBLIMAZE) injection 25 mcg  25 mcg Intravenous Q2H PRN Almon Hercules, MD       hydrocerin (EUCERIN) cream   Topical BID Almon Hercules, MD   Given at 05/23/23 0937   lactulose (CHRONULAC) 10 GM/15ML solution 30 g  30 g Oral TID Briscoe Deutscher, MD   30 g at 05/23/23 0935   lidocaine (PF) (XYLOCAINE) 1 % injection 10 mL  10 mL Intradermal Once Ralene Muskrat, PA-C       naloxone [facility]) injection 0.4 mg  0.4 mg Intravenous PRN Howerter, Justin B, DO       ondansetron (ZOFRAN) injection 4 mg  4 mg Intravenous Q6H PRN Howerter, Justin B, DO   4 mg at 05/20/23 0622   pantoprazole (PROTONIX) injection 40 mg  40 mg Intravenous Q12H Candelaria Stagers T, MD   40 mg at 05/23/23 0834   potassium chloride SA (KLOR-CON M) CR tablet 40 mEq  40 mEq Oral Q3H Almon Hercules, MD   40 mEq at 05/23/23 4098   prochlorperazine (COMPAZINE) injection 5 mg  5 mg Intravenous Q6H PRN Opyd, Lavone Neri, MD   5 mg at 05/20/23 1549   sodium chloride flush (NS) 0.9 % injection 3 mL  3 mL Intravenous Q12H Opyd, Lavone Neri, MD   3 mL at 05/23/23 1191   spironolactone (ALDACTONE) tablet 200 mg  200 mg Oral Daily Candelaria Stagers T, MD   200 mg at 05/23/23 0940   torsemide (DEMADEX) tablet 40 mg  40 mg Oral Daily Candelaria Stagers T, MD   40 mg at  05/23/23 4782    Allergies as of 05/19/2023 - Review Complete 05/19/2023  Allergen Reaction Noted   Tylenol [acetaminophen] Other (See Comments)    Dust mite extract  02/02/2023   Paroxetine Other (See Comments) 04/11/2022   Pollen extract  02/02/2023   Tramadol Nausea Only 03/30/2022     Review of Systems:    Constitutional: No weight loss, fever or chills Skin: No rash Cardiovascular: No chest pain Respiratory: No SOB  Gastrointestinal: See HPI and otherwise negative Genitourinary: No dysuria  Neurological: No headache, dizziness or syncope Musculoskeletal: No new muscle or joint pain Hematologic: No bleeding  Psychiatric: No history of depression or anxiety    Physical Exam:  Vital signs in last 24 hours: Temp:  [98.7 F (37.1 C)-99.5 F (37.5 C)] 99 F (37.2 C) (12/03 0759) Pulse Rate:  [89-106] 89 (12/03 0759) Resp:  [18] 18 (12/03 0759) BP: (101-110)/(48-65) 108/65 (12/03 0759) SpO2:  [99 %-100 %] 100 % (12/03 0759) Weight:  [956 kg] 123 kg (12/03 0500) Last BM Date : 05/23/23 General:   Pleasant overweight Caucasian male appears to be in NAD, Well developed, Well nourished, alert and cooperative Head:  Normocephalic and atraumatic. Eyes:   PEERL, EOMI. No icterus. Conjunctiva pink. Ears:  Normal auditory acuity. Neck:  Supple Throat: Oral cavity and pharynx without inflammation, swelling  or lesion. Teeth in good condition. Lungs: Respirations even and unlabored. Lungs clear to auscultation bilaterally.   No wheezes, crackles, or rhonchi.  Heart: Normal S1, S2. No MRG. Regular rate and rhythm. No peripheral edema, cyanosis or pallor.  Abdomen:  Soft, mild distension, nontender. No rebound or guarding. Normal bowel sounds. No appreciable masses or hepatomegaly. Rectal:  Not performed.  Msk:  Symmetrical without gross deformities. Peripheral pulses intact.  Extremities:  Without edema, no deformity or joint abnormality. Normal ROM, normal sensation. Neurologic:   Alert and oriented x4;  grossly normal neurologically. Skin:   Dry and intact without significant lesions or rashes. Psychiatric: Demonstrates good judgement and reason without abnormal affect or behaviors.  LAB RESULTS: Recent Labs    05/22/23 0724 05/22/23 1509 05/23/23 0552  WBC 11.3* 10.6* 7.6  HGB 7.8* 9.4* 7.9*  HCT 23.1* 27.3* 22.9*  PLT 103* 136* 105*   BMET Recent Labs    05/21/23 0547 05/22/23 0724 05/23/23 0552  NA 130* 131* 128*  K 3.3* 3.4* 2.9*  CL 102 100 100  CO2 21* 23 21*  GLUCOSE 105* 89 96  BUN 24* 25* 20  CREATININE 1.30* 1.19 1.38*  CALCIUM 8.1* 7.8* 7.8*      Latest Ref Rng & Units 05/23/2023    5:52 AM 05/22/2023    7:24 AM 05/21/2023    5:47 AM  Hepatic Function  Total Protein 6.5 - 8.1 g/dL 5.0  4.7  5.4   Albumin 3.5 - 5.0 g/dL 2.3  2.3  2.5   AST 15 - 41 U/L 42  33  31   ALT 0 - 44 U/L 26  20  21    Alk Phosphatase 38 - 126 U/L 81  75  82   Total Bilirubin <1.2 mg/dL 2.4  2.9  3.9      PT/INR Recent Labs    05/22/23 0724 05/23/23 0552  LABPROT 23.8* 22.9*  INR 2.1* 2.0*    Impression / Plan:   Impression: 1.  Hepatic encephalopathy: Status post TIPS in January, unclear precipitant, continued on Lactulose.  2-3 bowel movements a day, ammonia improving, encephalopathy resolved 2.  Cirrhosis: Attributed to MASH, undergoing transplant evaluation at [facility], MELD NA on admission 22, continued on Lasix 80 mg twice daily and Aldactone 20 mg daily 4.  Anemia: Hemoglobin baseline around 11.3, on admission 10.7--> 9.0--> 10.0--> 8.8--> 7.8--> 9.4--> 7.9, normal MCV, BUN minimally elevated on 12/1 and 12/2 around 25, now normal, no sign of overt bleeding, INR elevated at 2.1, recent EGD and colonoscopy as in HPI; uncertain etiology 5.  Leukocytosis: WBC elevated to 20 on 12/1, leukocytosis and improved on Ceftriaxone, no sign of SBP; unclear etiology 6.  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  Morbid obesity 8.  Sore throat: Patient's  largest complaint today, apparently unable to drink even water without pain  Plan: 1.  Hemoccult card is pending, though no reported overt signs of GI bleeding 2.  Would recommend continued monitoring of hemoglobin and transfusion as needed less than 7 3.  With reported sore throat could consider repeat EGD +/- colonoscopy given history of multiple polyps and repeat recommended now anyways.  Could also just treat with viscous lidocaine to see if this improves. 4.  Continue current medications for cirrhosis including Lactulose 30 g 3 times daily, Pantoprazole 40 twice daily, Aldactone 200 mg daily 5.  Please await further recommendations from Dr. Adela Lank later today.  Thank you for your kind consultation, we will continue to  follow.  [facility]  05/23/2023, 11:38 AM

## 2023-05-23 NOTE — Progress Notes (Signed)
Mobility Specialist Progress Note:    05/23/23 0848  Mobility  Activity Ambulated with assistance in hallway  Level of Assistance Modified independent, requires aide device or extra time  Assistive Device Other (Comment) (IV pole)  Distance Ambulated (ft) 220 ft  Activity Response Tolerated well  Mobility Referral Yes  $Mobility charge 1 Mobility  Mobility Specialist Start Time (ACUTE ONLY) 0848  Mobility Specialist Stop Time (ACUTE ONLY) 0859  Mobility Specialist Time Calculation (min) (ACUTE ONLY) 11 min   Pt received in chair, guest at bedside, agreeable to mobility session. Ambulated in hallway, ModI using IV pole. Tolerated well, asx throughout. Returned pt to room, all needs met.    Feliciana Rossetti Mobility Specialist Please contact via Special educational needs teacher or  Rehab office at [phone]

## 2023-05-23 NOTE — Plan of Care (Signed)

## 2023-05-23 NOTE — Progress Notes (Signed)
PROGRESS NOTE  Erik Howe WGN:[id number] DOB: 1970-08-13   PCP: [name], PA-C  Patient is from: Home.  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  Ammonia elevated to 97.  Reports compliance with lactulose but not sure about the dosage.  Underwent diagnostic paracentesis in ED that suggests portal hypertension.  Acetic fluid studies not consistent with SBP.  Patient was started on lactulose and admitted.   Underwent therapeutic paracentesis on 12/1 with removal of 4 L.  Patient's encephalopathy improving with lactulose. Significant leukocytosis to 21 despite clinical improvement.  Unclear source of infection clinically.  Peritoneal fluid analysis not consistent with SBP.  Blood culture ordered.  Started on IV ceftriaxone.  Blood cultures NGTD.  Leukocytosis improved.  Decline in hemoglobin.  Hemoccult positive.  Concern for GI bleed although no overt bleeding.  GI consulted.  Subjective: Seen and examined earlier this morning.  No major events overnight or this morning.  Reports severe heartburn that makes it difficult for him to swallow even a Jell-O.  Reports about 4 bowel movements last night.  Not making a lot of urine.  He attributes this to not drinking fluids.  Denies dizziness, shortness of breath, cough, nausea or vomiting.  Eager to go home but understands the need to stay in the hospital due to drop in hemoglobin and electrolyte abnormalities and need for GI evaluation.  Patient sister at bedside  Objective: Vitals:   05/22/23 1958 05/23/23 0344 05/23/23 0500 05/23/23 0759  BP: (!)  101/55 (!) 105/48  108/65  Pulse: 99 93  89  Resp: 18 18  18   Temp: 98.7 F (37.1 C) 99.1 F (37.3 C)  99 F (37.2 C)  TempSrc: Oral Oral  Oral  SpO2: 100% 99%  100%  Weight:   123 kg   Height:        Examination:  GENERAL: No apparent distress.  Nontoxic.  Sitting on bedside chair. HEENT: MMM.  Vision and hearing grossly intact.  NECK: Supple.  No apparent JVD.  RESP:  No IWOB.  Fair aeration bilaterally. CVS:  RRR. Heart sounds normal.  ABD/GI/GU: BS+. Abd soft.  Difficult exam due to body habitus MSK/EXT:  Moves extremities. No apparent deformity.  1-2 BLE edema. SKIN: Dry skin but no apparent lesion. NEURO: Awake and alert and oriented x 4.  No apparent focal neuro deficit. PSYCH: Calm. Normal affect.   Procedures:  11/29-diagnostic paracentesis 12/1-therapeutic paracentesis with removal of 4.1 L   Microbiology summarized: 11/29-peritoneal fluid culture pending  Assessment and plan: Acute hepatic encephalopathy: Reports compliance with lactulose and he is diuretics.  Ammonia 97>>> 40..  Encephalopathy resolved.  Oriented x 4. -Continue lactulose 30 g 3 times daily -Avoid or minimize sedating medications. -Fall and delirium precaution   NASH cirrhosis with recurrent ascites even after TIPS in 06/2022: gets periodic large-volume paracentesis.  Undergoing transplant evaluation at [facility].. MELD-Na score 27 with projected 27 to 32% 90-day mortality.  Reports decreased urine output. -S/p therapeutic paracentesis with removal of 4.1 L on 12/1.  -Changed Lasix to torsemide -Decrease Aldactone to 100 mg daily given hyponatremia and AKI. -Antibiotics as above -Lactulose as above  Leukocytosis: WBC  elevated to 20 on 12/1.  No fever but mild temp to 99.  Low suspicion for SBP on exam and peritoneal fluid analysis.  Blood cultures NGTD.  Leukocytosis improved after starting ceftriaxone -Continue ceftriaxone for now -May need SBP prophylaxis moving forward -Continue  monitoring  Anemia of chronic disease: Hgb dropped about 3 g.  No signs of overt bleeding.  No overt bleeding but Hemoccult positive.  INR 2.0. Recent Labs    09/01/22 1129 10/19/22 1213 10/21/22 1038 05/19/23 1546 05/20/23 0432 05/20/23 1405 05/21/23 0547 05/22/23 0724 05/22/23 1509 05/23/23 0552  HGB 11.4* 11.5* 11.3* 10.7* 9.0* 10.0* 8.8* 7.8* 9.4* 7.9*  -Recheck CBC this afternoon -Change Protonix to IV -GI consulted.  Thrombocytopenia: Likely due to liver cirrhosis.  Relatively stable -SCD for VTE prophylaxis -Monitor  Hyperbilirubinemia: Improved. -Continue monitoring  Mood disorder/chronic pain: Seems to take some muscle relaxer on amitriptyline at home.  Med rec pending. -Hold p.o. meds for now.  Hyponatremia: Likely hypervolemic in the setting of cirrhosis/ascites and high-dose Aldactone. -Decreased Aldactone as above  Hypokalemia/hypomagnesemia -Monitor replenish as appropriate  Coagulopathy:  Likely due to liver cirrhosis -SCD for VTE prophylaxis -May consider vitamin K  Dry skin -Eucerin twice daily.  Sore throat?  Seems patient has reported sore throat GI although he reported severe heartburn to me this morning. -Check COVID and RVP   Morbid obesity Body mass index is 33.89 kg/m.          DVT prophylaxis:  Place and maintain sequential compression device Start: 05/21/23 1422  Code Status: Full code Family Communication: Updated patient's sister at bedside Level of care: Med-Surg Status is: Inpatient Remains inpatient appropriate because: Acute hepatic encephalopathy, significant leukocytosis and anemia   Final disposition: Likely home Consultants:  Interventional radiology Gastroenterology  55 minutes with more than 50% spent in reviewing records, counseling patient/family and coordinating care.   Sch Meds:  Scheduled Meds:  hydrocerin   Topical BID   lactulose  30 g Oral TID   lidocaine (PF)  10 mL Intradermal Once    pantoprazole (PROTONIX) IV  40 mg Intravenous Q12H   sodium chloride flush  3 mL Intravenous Q12H   spironolactone  200 mg Oral Daily   torsemide  40 mg Oral Daily   Continuous Infusions:  cefTRIAXone (ROCEPHIN)  IV 2 g (05/23/23 0842)   PRN Meds:.alum & mag hydroxide-simeth, fentaNYL (SUBLIMAZE) injection, naLOXone (NARCAN)  injection, ondansetron (ZOFRAN) IV, prochlorperazine  Antimicrobials: Anti-infectives (From admission, onward)    Start     Dose/Rate Route Frequency Ordered Stop   05/21/23 0745  cefTRIAXone (ROCEPHIN) 2 g in sodium chloride 0.9 % 100 mL IVPB        2 g 200 mL/hr over 30 Minutes Intravenous Every 24 hours 05/21/23 0647 05/26/23 0744        I have personally reviewed the following labs and images: CBC: Recent Labs  Lab 05/19/23 1546 05/20/23 0432 05/20/23 1405 05/21/23 0547 05/22/23 0724 05/22/23 1509 05/23/23 0552  WBC 9.3   < > 15.7* 20.9* 11.3* 10.6* 7.6  NEUTROABS 6.4  --   --   --   --   --   --   HGB 10.7*   < > 10.0* 8.8* 7.8* 9.4* 7.9*  HCT 29.0*   < > 28.9* 25.9* 23.1* 27.3* 22.9*  MCV 95.1   < > 94.8 93.5 94.7 96.5 93.9  PLT 164   < > 147* 128* 103* 136* 105*   < > = values in this  interval not displayed.   BMP &GFR Recent Labs  Lab 05/19/23 1546 05/20/23 0432 05/21/23 0547 05/22/23 0724 05/23/23 0552  NA 133* 132* 130* 131* 128*  K 4.0 3.5 3.3* 3.4* 2.9*  CL 98 100 102 100 100  CO2 24 22 21* 23 21*  GLUCOSE 102* 119* 105* 89 96  BUN 19 17 24* 25* 20  CREATININE 1.08 1.12 1.30* 1.19 1.38*  CALCIUM 8.7* 8.4* 8.1* 7.8* 7.8*  MG  --   --   --  1.6* 1.7  PHOS  --   --   --  2.7  --    Estimated Creatinine Clearance: 88.5 mL/min (A) (by C-G formula based on SCr of 1.38 mg/dL (H)). Liver & Pancreas: Recent Labs  Lab 05/19/23 1546 05/20/23 0432 05/21/23 0547 05/22/23 0724 05/23/23 0552  AST 40 38 31 33 42*  ALT 23 23 21 20 26   ALKPHOS 106 86 82 75 81  BILITOT 3.8* 4.6* 3.9* 2.9* 2.4*  PROT 6.6 6.1* 5.4* 4.7* 5.0*   ALBUMIN 3.1* 2.9* 2.5* 2.3* 2.3*   Recent Labs  Lab 05/19/23 1546  LIPASE 38   Recent Labs  Lab 05/19/23 1546 05/21/23 1006 05/22/23 0724  AMMONIA 97* 35 40*   Diabetic: No results for input(s): "HGBA1C" in the last 72 hours. No results for input(s): "GLUCAP" in the last 168 hours. Cardiac Enzymes: No results for input(s): "CKTOTAL", "CKMB", "CKMBINDEX", "TROPONINI" in the last 168 hours. No results for input(s): "PROBNP" in the last 8760 hours. Coagulation Profile: Recent Labs  Lab 05/19/23 1546 05/21/23 1006 05/22/23 0724 05/23/23 0552  INR 1.7* 2.2* 2.1* 2.0*   Thyroid Function Tests: No results for input(s): "TSH", "T4TOTAL", "FREET4", "T3FREE", "THYROIDAB" in the last 72 hours. Lipid Profile: No results for input(s): "CHOL", "HDL", "LDLCALC", "TRIG", "CHOLHDL", "LDLDIRECT" in the last 72 hours. Anemia Panel: No results for input(s): "VITAMINB12", "FOLATE", "FERRITIN", "TIBC", "IRON", "RETICCTPCT" in the last 72 hours. Urine analysis:    Component Value Date/Time   COLORURINE AMBER (A) 05/19/2023 1524   APPEARANCEUR CLEAR 05/19/2023 1524   LABSPEC 1.018 05/19/2023 1524   PHURINE 5.0 05/19/2023 1524   GLUCOSEU NEGATIVE 05/19/2023 1524   HGBUR MODERATE (A) 05/19/2023 1524   BILIRUBINUR NEGATIVE 05/19/2023 1524   KETONESUR NEGATIVE 05/19/2023 1524   PROTEINUR NEGATIVE 05/19/2023 1524   NITRITE NEGATIVE 05/19/2023 1524   LEUKOCYTESUR NEGATIVE 05/19/2023 1524   Sepsis Labs: Invalid input(s): "PROCALCITONIN", "LACTICIDVEN"  Microbiology: Recent Results (from the past 240 hour(s))  Body fluid culture w Gram Stain     Status: None   Collection Time: 05/19/23  8:02 PM   Specimen: Peritoneal Cavity; Peritoneal Fluid  Result Value Ref Range Status   Specimen Description   Final    PERITONEAL CAVITY Performed at [facility], [address], [place], [place] [address]    Special Requests   Final    NONE Performed at [facility], [address] [place], [place] [address]    Gram Stain NO WBC SEEN NO ORGANISMS SEEN   Final   Culture   Final    NO GROWTH 3 DAYS Performed at [facility], [address] [place], [place] [address]    Report Status 05/23/2023 FINAL  Final  Culture, blood (Routine X 2) w Reflex to ID Panel     Status: None (Preliminary result)   Collection Time: 05/21/23 10:06 AM   Specimen: BLOOD LEFT ARM  Result Value Ref Range Status   Specimen  Description BLOOD LEFT ARM  Final   Special Requests   Final    BOTTLES DRAWN AEROBIC AND ANAEROBIC Blood Culture results may not be optimal due to an inadequate volume of blood received in culture bottles   Culture   Final    NO GROWTH 2 DAYS Performed at [facility], [address], [place], [place] [address]    Report Status PENDING  Incomplete  Culture, blood (Routine X 2) w Reflex to ID Panel     Status: None (Preliminary result)   Collection Time: 05/21/23 10:14 AM   Specimen: BLOOD RIGHT HAND  Result Value Ref Range Status   Specimen Description BLOOD RIGHT HAND  Final   Special Requests   Final    BOTTLES DRAWN AEROBIC AND ANAEROBIC Blood Culture results may not be optimal due to an inadequate volume of blood received in culture bottles   Culture   Final    NO GROWTH 2 DAYS Performed at [facility], [address] [place], [place] [address]    Report Status PENDING  Incomplete    Radiology Studies: No results found.    Alyssamarie Mounsey T. Mete Purdum [facility]  If 7PM-7AM, please contact night-coverage [email or web address] 05/23/2023, 2:28 PM

## 2023-05-23 NOTE — Progress Notes (Signed)
Transition of Care [facility]) - Inpatient Brief Assessment   Patient Details  Name: Erik Howe MRN: [id number] Date of Birth: August 29, 1970  Transition of Care [facility]) CM/SW Contact:    Janae Bridgeman, RN Phone Number: 05/23/2023, 4:25 PM   Clinical Narrative: Patient admitted for hepatic encephalopathy.  No TOC needs at this time.   Transition of Care Asessment: Insurance and Status: (P) Insurance coverage has been reviewed Patient has primary care physician: (P) Yes Home environment has been reviewed: (P) From home Prior level of function:: (P) Independent Prior/Current Home Services: (P) No current home services Social Determinants of Health Reivew: (P) SDOH reviewed interventions complete Readmission risk has been reviewed: (P) Yes Transition of care needs: (P) no transition of care needs at this time

## 2023-05-24 ENCOUNTER — Other Ambulatory Visit: Payer: Self-pay

## 2023-05-24 DIAGNOSIS — K7682 Hepatic encephalopathy: Secondary | ICD-10-CM | POA: Diagnosis not present

## 2023-05-24 LAB — COMPREHENSIVE METABOLIC PANEL
ALT: 28 U/L (ref 0–44)
AST: 41 U/L (ref 15–41)
Albumin: 2.1 g/dL — ABNORMAL LOW (ref 3.5–5.0)
Alkaline Phosphatase: 90 U/L (ref 38–126)
Anion gap: 5 (ref 5–15)
BUN: 15 mg/dL (ref 6–20)
CO2: 21 mmol/L — ABNORMAL LOW (ref 22–32)
Calcium: 7.5 mg/dL — ABNORMAL LOW (ref 8.9–10.3)
Chloride: 102 mmol/L (ref 98–111)
Creatinine, Ser: 1.31 mg/dL — ABNORMAL HIGH (ref 0.61–1.24)
GFR, Estimated: >60 mL/min (ref >60)
Glucose, Bld: 98 mg/dL (ref 70–99)
Potassium: 3.1 mmol/L — ABNORMAL LOW (ref 3.5–5.1)
Sodium: 128 mmol/L — ABNORMAL LOW (ref 135–145)
Total Bilirubin: 2 mg/dL — ABNORMAL HIGH (ref <1.2)
Total Protein: 4.8 g/dL — ABNORMAL LOW (ref 6.5–8.1)

## 2023-05-24 LAB — PROTIME-INR
INR: 2 — ABNORMAL HIGH (ref 0.8–1.2)
Prothrombin Time: 23 s — ABNORMAL HIGH (ref 11.4–15.2)

## 2023-05-24 LAB — CBC
HCT: 23.3 % — ABNORMAL LOW (ref 39.0–52.0)
Hemoglobin: 7.9 g/dL — ABNORMAL LOW (ref 13.0–17.0)
MCH: 32.1 pg (ref 26.0–34.0)
MCHC: 33.9 g/dL (ref 30.0–36.0)
MCV: 94.7 fL (ref 80.0–100.0)
Platelets: 113 10*3/uL — ABNORMAL LOW (ref 150–400)
RBC: 2.46 MIL/uL — ABNORMAL LOW (ref 4.22–5.81)
RDW: 14.6 % (ref 11.5–15.5)
WBC: 8.3 10*3/uL (ref 4.0–10.5)
nRBC: 0 % (ref 0.0–0.2)

## 2023-05-24 LAB — MAGNESIUM: Magnesium: 1.6 mg/dL — ABNORMAL LOW (ref 1.7–2.4)

## 2023-05-24 MED ORDER — FLUCONAZOLE 200 MG PO TABS: 200.0000 mg | ORAL_TABLET | Freq: Every day | ORAL | Status: DC

## 2023-05-24 MED ORDER — MAGNESIUM SULFATE 2 GM/50ML IV SOLN
2.0000 g | Freq: Once | INTRAVENOUS | Status: AC
  Administered 2023-05-24: 2 g via INTRAVENOUS
  Filled 2023-05-24: qty 50

## 2023-05-24 MED ORDER — MAGNESIUM OXIDE 400 MG PO TABS
400.0000 mg | ORAL_TABLET | Freq: Every day | ORAL | 0 refills | Status: AC
  Filled 2023-05-24: qty 30, 30d supply, fill #0

## 2023-05-24 MED ORDER — PANTOPRAZOLE SODIUM 40 MG PO TBEC
40.0000 mg | DELAYED_RELEASE_TABLET | Freq: Two times a day (BID) | ORAL | 0 refills | Status: DC
  Filled 2023-05-24 (×2): qty 60, 30d supply, fill #0

## 2023-05-24 MED ORDER — PANTOPRAZOLE SODIUM 40 MG PO TBEC
40.0000 mg | DELAYED_RELEASE_TABLET | Freq: Two times a day (BID) | ORAL | Status: DC
  Administered 2023-05-24: 40 mg via ORAL
  Filled 2023-05-24: qty 1

## 2023-05-24 MED ORDER — TORSEMIDE 20 MG PO TABS
40.0000 mg | ORAL_TABLET | Freq: Every day | ORAL | 0 refills | Status: DC
  Filled 2023-05-24: qty 60, 30d supply, fill #0

## 2023-05-24 MED ORDER — FLUCONAZOLE 200 MG PO TABS
200.0000 mg | ORAL_TABLET | Freq: Every day | ORAL | 0 refills | Status: DC
  Filled 2023-05-24: qty 13, 13d supply, fill #0

## 2023-05-24 MED ORDER — SPIRONOLACTONE 100 MG PO TABS
200.0000 mg | ORAL_TABLET | Freq: Every day | ORAL | 3 refills | Status: DC
  Filled 2023-05-24: qty 360, 180d supply, fill #0

## 2023-05-24 MED ORDER — POTASSIUM CHLORIDE CRYS ER 20 MEQ PO TBCR
40.0000 meq | EXTENDED_RELEASE_TABLET | ORAL | Status: DC
  Administered 2023-05-24: 40 meq via ORAL
  Filled 2023-05-24: qty 2

## 2023-05-24 MED ORDER — FLUCONAZOLE 200 MG PO TABS
400.0000 mg | ORAL_TABLET | Freq: Once | ORAL | Status: AC
  Administered 2023-05-24: 400 mg via ORAL
  Filled 2023-05-24: qty 2

## 2023-05-24 NOTE — Plan of Care (Signed)

## 2023-05-24 NOTE — Progress Notes (Signed)
Mobility Specialist Progress Note:   05/24/23 1000  Mobility  Activity Ambulated independently in hallway;Ambulated with assistance in hallway  Level of Assistance Modified independent, requires aide device or extra time  Assistive Device Other (Comment) (IV Pole)  Distance Ambulated (ft) 500 ft  Activity Response Tolerated well  Mobility Referral Yes  $Mobility charge 1 Mobility  Mobility Specialist Start Time (ACUTE ONLY) 1015  Mobility Specialist Stop Time (ACUTE ONLY) 1029  Mobility Specialist Time Calculation (min) (ACUTE ONLY) 14 min   Pt eager for mobility session. Required no physical assistance throughout session. Denies any symptoms. Pt back in chair with all needs met.   Addison Lank Mobility Specialist Please contact via SecureChat or  Rehab office at [phone]

## 2023-05-24 NOTE — Progress Notes (Signed)
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 (Dr Jerral Ralph)   Ulyses Southward, PharmD, BCIDP, AAHIVP, CPP Infectious Disease Pharmacist 05/24/2023 7:50 AM

## 2023-05-24 NOTE — Discharge Summary (Signed)
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  Ammonia elevated to 97.  Reported compliance with lactulose but not sure about the dosage.  Underwent diagnostic paracentesis in ED that suggested portal hypertension.  Ascitic fluid studies not consistent with SBP.  Patient was started on lactulose and admitted.    Underwent therapeutic paracentesis on 12/1 with removal of 4 L.  Patient's encephalopathy improving with lactulose.  Leukocytosis improved.  Peritoneal fluid analysis not consistent with SBP.  Blood culture negative.   Patient improved with symptomatic treatment.  Also seen by GI.  Declined to have endoscopic evaluation now.  Recommended to treat with Diflucan for 2 weeks.  Going home today.  Treated for following conditions.   Acute hepatic encephalopathy, Elita Boone cirrhosis with recurrent ascites and TIPS, fluid overload. Patient was treated with lactulose, he received 5 days of IV Rocephin.  His mental status has improved.  On Lasix at home.  Recommended to change to torsemide 40 mg daily on discharge.  Patient was taking 200 mg of Aldactone twice daily, recommended to cut down to 200 mg daily.  With  clinical improvement, he is going home with lactulose, Aldactone, torsemide.  He will have outpatient follow-up with GI.  Anemia of chronic disease: His hemoglobin dropped about 3 points.  There was no evidence of overt bleeding.  GI was consulted.  Patient declined to have endoscopic evaluation now given improvement of symptoms.  Empiric treatment for esophageal candidiasis due to complaints of odynophagia with Diflucan for 2 weeks.  Hypokalemia and hypomagnesemia: Replaced today before discharge.  He will also be on maintenance dose of scheduled potassium and magnesium.  Patient is stable for discharge home.   Discharge Diagnoses:  Principal Problem:   Hepatic encephalopathy (HCC) Active Problems:   Cirrhosis of liver with ascites (HCC)   Heme positive stool   Anemia    Discharge Instructions  Discharge Instructions     Diet general   Complete by: As directed    Increase activity slowly   Complete by: As directed       Allergies as of 05/24/2023       Reactions   Tylenol [acetaminophen] Other (See Comments)   Told to avoid due to liver issues   Dust Mite Extract    Paroxetine Other (See Comments)   Stomach issues, "felt "wrong," and GI Intolerance   Pollen Extract    Tramadol Nausea Only        Medication List     STOP taking these medications    cyclobenzaprine 10 MG tablet Commonly known as: FLEXERIL   diphenhydrAMINE 25 MG tablet Commonly known as: SOMINEX   furosemide 80 MG tablet Commonly known as: Lasix   omeprazole 20 MG capsule Commonly known as: PRILOSEC       TAKE these medications    albuterol 108 (90 Base) MCG/ACT inhaler Commonly  known as: VENTOLIN HFA Inhale 2 puffs into the lungs every 6 (six) hours as needed for shortness of breath   amitriptyline 10 MG tablet Commonly known as: ELAVIL Take 1 tablet (10 mg total) by mouth at bedtime.   ammonium lactate 12 % lotion Commonly known as: LAC-HYDRIN Apply topically as needed for  dry skin. Twice a day   Baclofen 5 MG Tabs Take 2 tablets (10 mg total) by mouth 3 (three) times daily.   bismuth subsalicylate 262 MG/15ML suspension Commonly known as: PEPTO BISMOL Take 30 mLs by mouth every 6 (six) hours as needed for indigestion.   clobetasol ointment 0.05 % Commonly known as: TEMOVATE Apply topically 2 (two) times a day. To affected area (right hand)   diphenhydrAMINE 50 MG tablet Commonly known as: BENADRYL Take 50 mg by mouth every 8 (eight) hours as needed for itching or allergies.   docusate sodium 100 MG capsule Commonly known as: COLACE Take 200 mg by mouth daily as needed (constipation.).   fluconazole 200 MG tablet Commonly known as: DIFLUCAN Take 1 tablet (200 mg total) by mouth daily for 13 days. Start taking on: May 25, 2023   IVERMECTIN PO Take 3 mg by mouth.   lactulose 10 GM/15ML solution Commonly known as: CHRONULAC Take 3.33 g by mouth 3 (three) times daily.   ondansetron 8 MG disintegrating tablet Commonly known as: ZOFRAN-ODT Take 1 tablet (8 mg total) by mouth 3 (three) times daily as needed for nausea.   pantoprazole 40 MG tablet Commonly known as: PROTONIX Take 1 tablet (40 mg total) by mouth 2 (two) times daily.   potassium chloride SA 20 MEQ tablet Commonly known as: KLOR-CON M Take 2 tablets (40 mEq total) by mouth daily.   spironolactone 100 MG tablet Commonly known as: ALDACTONE Take 2 tablets (200 mg total) by mouth daily. What changed: when to take this   SSD 1 % cream Generic drug: silver sulfADIAZINE Apply topically 2 (two) times a day. To affected areas   Torsemide 40 MG Tabs Take 40 mg by mouth daily. Start taking on: May 25, 2023        Allergies  Allergen Reactions   Tylenol [Acetaminophen] Other (See Comments)    Told to avoid due to liver issues   Dust Mite Extract    Paroxetine Other (See Comments)    Stomach issues, "felt "wrong," and GI Intolerance   Pollen Extract    Tramadol  Nausea Only    Consultations: Gastroenterology   Procedures/Studies: US Paracentesis  Result Date: 05/21/2023 INDICATION: Cirrhosis.  Recurrent ascites EXAM: ULTRASOUND GUIDED PARACENTESIS MEDICATIONS: 10 cc 1% lidocaine. COMPLICATIONS: None immediate. PROCEDURE: Informed written consent was obtained from the patient after a discussion of the risks, benefits and alternatives to treatment. A timeout was performed prior to the initiation of the procedure. Initial ultrasound scanning demonstrates a large amount of ascites within the right lower abdominal quadrant. The right lower abdomen was prepped and draped in the usual sterile fashion. 1% lidocaine was used for local anesthesia. Following this, a 7 cm Yueh catheter was introduced. An ultrasound image was saved for documentation purposes. The paracentesis was performed. The catheter was removed and a dressing was applied. The patient tolerated the procedure well without immediate post procedural complication. Patient received post-procedure intravenous albumin; see nursing notes for details. FINDINGS: A total of approximately 4.1 Liters of serous fluid was removed. IMPRESSION: Successful ultrasound-guided paracentesis yielding 4.1 Liters liters of peritoneal fluid. Performed by Robet Leu PAC  Electronically Signed   By: Simonne Come M.D.   On: 05/21/2023 10:01   CT ABDOMEN PELVIS W CONTRAST  Result Date: 05/19/2023 CLINICAL DATA:  Right upper quadrant pain.  History of cirrhosis. EXAM: CT ABDOMEN AND PELVIS WITH CONTRAST TECHNIQUE: Multidetector CT imaging of the abdomen and pelvis was performed using the standard protocol following bolus administration of intravenous contrast. RADIATION DOSE REDUCTION: This exam was performed according to the departmental dose-optimization program which includes automated exposure control, adjustment of the mA and/or kV according to patient size and/or use of iterative reconstruction technique. CONTRAST:   OMNIPAQUE IOHEXOL 300 MG/ML  SOLN COMPARISON:  03/10/2022 FINDINGS: Lower chest: No acute abnormality. Hepatobiliary: Shrunken, nodular appearance of the liver compatible with cirrhosis. No focal liver lesion identified. TIPS in place. Prior cholecystectomy. No biliary dilatation. Pancreas: Unremarkable. No pancreatic ductal dilatation or surrounding inflammatory changes. Spleen: Mildly enlarged without focal abnormality. Adrenals/Urinary Tract: Adrenal glands are unremarkable. Kidneys are normal, without renal calculi, solid lesion, or hydronephrosis. Bladder is unremarkable. Stomach/Bowel: Small hiatal hernia. Stomach otherwise unremarkable. No evidence of bowel obstruction. Long segment mild colonic wall thickening of the ascending colon. The appendix is normal in appearance (series 2, image 46). Vascular/Lymphatic: Scattered aortic atherosclerosis. Mild upper abdominal varices. No lymphadenopathy. Reproductive: Prostate is unremarkable. Other: Moderate volume ascites.  No pneumoperitoneum. Musculoskeletal: Anasarca. Gynecomastia. No acute bony abnormality. IMPRESSION: 1. Cirrhosis with sequela of portal hypertension including moderate volume ascites, mild splenomegaly, and mild upper abdominal varices. TIPS in place. 2. Long segment mild colonic wall thickening of the ascending colon, which may reflect portal colopathy or a mild colitis. 3. Small hiatal hernia. 4. Aortic atherosclerosis (ICD10-I70.0). Electronically Signed   By: Duanne Guess D.O.   On: 05/19/2023 19:03   DG Chest Port 1 View  Result Date: 05/19/2023 CLINICAL DATA:  Altered mental status EXAM: PORTABLE CHEST 1 VIEW COMPARISON:  02/11/2018 FINDINGS: The heart size and mediastinal contours are within normal limits. Both lungs are clear. The visualized skeletal structures are unremarkable. IMPRESSION: No active disease. Electronically Signed   By: Duanne Guess D.O.   On: 05/19/2023 16:27   CT Head Wo Contrast  Result Date:  05/19/2023 CLINICAL DATA:  New onset confusion.  Known attic cirrhosis. EXAM: CT HEAD WITHOUT CONTRAST TECHNIQUE: Contiguous axial images were obtained from the base of the skull through the vertex without intravenous contrast. RADIATION DOSE REDUCTION: This exam was performed according to the departmental dose-optimization program which includes automated exposure control, adjustment of the mA and/or kV according to patient size and/or use of iterative reconstruction technique. COMPARISON:  None Available. FINDINGS: Brain: No evidence of acute infarction, hemorrhage, hydrocephalus, extra-axial collection or mass lesion/mass effect. No significant white matter lesions are present. Deep brain nuclei are within normal limits. The ventricles are of normal size. No significant extraaxial fluid collection is present. The brainstem and cerebellum are within normal limits. Midline structures are within normal limits. Vascular: No hyperdense vessel or unexpected calcification. Skull: Calvarium is intact. No focal lytic or blastic lesions are present. No significant extracranial soft tissue lesion is present. Sinuses/Orbits: The paranasal sinuses and mastoid air cells are clear. The globes and orbits are within normal limits. IMPRESSION: Normal CT appearance of the brain. Electronically Signed   By: Marin Roberts M.D.   On: 05/19/2023 16:25   IR Paracentesis  Result Date: 05/16/2023 INDICATION: 52 year old male with cirrhosis, recurrent ascites. EXAM: ULTRASOUND GUIDED THERAPEUTIC PARACENTESIS MEDICATIONS: 10 mL 1% lidocaine COMPLICATIONS: None immediate. PROCEDURE: Informed written  consent was obtained from the patient after a discussion of the risks, benefits and alternatives to treatment. A timeout was performed prior to the initiation of the procedure. Initial ultrasound scanning demonstrates a large amount of ascites within the right lower abdominal quadrant. The right lower abdomen was prepped and draped  in the usual sterile fashion. 1% lidocaine was used for local anesthesia. Following this, a 19 gauge, 10-cm, Yueh catheter was introduced. An ultrasound image was saved for documentation purposes. The paracentesis was performed. The catheter was removed and a dressing was applied. The patient tolerated the procedure well without immediate post procedural complication. Patient received post-procedure intravenous albumin; see nursing notes for details. FINDINGS: A total of approximately 8.0 liters of yellow fluid was removed. IMPRESSION: Successful ultrasound-guided paracentesis yielding 8.0 liters of peritoneal fluid. Performed by: Loyce Dys PA-C Electronically Signed   By: Marliss Coots M.D.   On: 05/16/2023 11:36   US Abdomen Complete  Result Date: 05/02/2023 CLINICAL DATA:  Cirrhosis EXAM: ABDOMEN ULTRASOUND COMPLETE COMPARISON:  Ultrasound abdomen 10/05/2022 FINDINGS: Gallbladder: Surgically absent Common bile duct: Diameter: 4 mm Liver: Coarsened echogenicity and nodular contour. Portal vein is patent on color Doppler imaging with normal direction of blood flow towards the liver. Tips stent is visualized however not adequately assessed. IVC: No abnormality visualized. Pancreas: Not visualized Spleen: Size and appearance within normal limits. Right Kidney: Length: 12.8 cm. Echogenicity within normal limits. No mass or hydronephrosis visualized. Left Kidney: Length: 11.8 cm. Echogenicity within normal limits. No mass or hydronephrosis visualized. Abdominal aorta: No aneurysm visualized. Other findings: Right upper quadrant ascites. IMPRESSION: 1. Cirrhotic morphology of the liver. No focal hepatic mass. 2. Right upper quadrant ascites. Electronically Signed   By: Annia Belt M.D.   On: 05/02/2023 15:08   IR Paracentesis  Result Date: 05/01/2023 INDICATION: 53 year old male with recurrent ascites. Request made for therapeutic paracentesis. EXAM: ULTRASOUND GUIDED THERAPEUTIC PARACENTESIS MEDICATIONS:  10 mL 1% lidocaine COMPLICATIONS: None immediate. PROCEDURE: Informed written consent was obtained from the patient after a discussion of the risks, benefits and alternatives to treatment. A timeout was performed prior to the initiation of the procedure. Initial ultrasound scanning demonstrates a large amount of ascites within the left lateral abdomen. The left lateral abdomen was prepped and draped in the usual sterile fashion. 1% lidocaine was used for local anesthesia. Following this, a 19 gauge, 10-cm, Yueh catheter was introduced. An ultrasound image was saved for documentation purposes. The paracentesis was performed. The catheter was removed and a dressing was applied. The patient tolerated the procedure well without immediate post procedural complication. FINDINGS: A total of approximately 6.0 liters of yellow fluid was removed. IMPRESSION: Successful ultrasound-guided paracentesis yielding 6.0 liters of peritoneal fluid. Performed by: Loyce Dys PA-C Electronically Signed   By: Marliss Coots M.D.   On: 05/01/2023 14:44   (Echo, Carotid, EGD, Colonoscopy, ERCP)    Subjective: Patient seen and examined.  He was walking in the hallway with mobility technician.  Sister at the bedside.  Patient denies any complaints.  He thinks he is much better today and is eager to go home.  Family feels comfortable for him to go home.   Discharge Exam: Vitals:   05/24/23 0401 05/24/23 0747  BP: (!) 106/52 118/71  Pulse: 98 94  Resp: 18 18  Temp: 99.2 F (37.3 C) 97.8 F (36.6 C)  SpO2: 96% 100%   Vitals:   05/23/23 1540 05/23/23 2024 05/24/23 0401 05/24/23 0747  BP: 109/65 109/70 Marland Kitchen)  106/52 118/71  Pulse: (!) 101 96 98 94  Resp: 19 18 18 18   Temp: 98.8 F (37.1 C) 99.2 F (37.3 C) 99.2 F (37.3 C) 97.8 F (36.6 C)  TempSrc: Oral Oral Oral Oral  SpO2: 100% 100% 96% 100%  Weight:   123 kg   Height:        General: Pt is alert, awake, not in acute distress On room air.  Walking around in  the hallway.  Chronically sick looking but not in any distress. Cardiovascular: RRR, S1/S2 +, no rubs, no gallops Respiratory: CTA bilaterally, no wheezing, no rhonchi Abdominal: Soft,ND, bowel sounds +, obese and pendulous.  Nontender. Extremities: Nonpitting edema bilateral legs.    The results of significant diagnostics from this hospitalization (including imaging, microbiology, ancillary and laboratory) are listed below for reference.     Microbiology: Recent Results (from the past 240 hour(s))  Body fluid culture w Gram Stain     Status: None   Collection Time: 05/19/23  8:02 PM   Specimen: Peritoneal Cavity; Peritoneal Fluid  Result Value Ref Range Status   Specimen Description   Final    PERITONEAL CAVITY Performed at [facility], [address] [place], [place] [address]    Special Requests   Final    NONE Performed at [facility], [address] [place], [place] [address]    Gram Stain NO WBC SEEN NO ORGANISMS SEEN   Final   Culture   Final    NO GROWTH 3 DAYS Performed at [facility], [address], [place], [place] [address]    Report Status 05/23/2023 FINAL  Final  Culture, blood (Routine X 2) w Reflex to ID Panel     Status: None (Preliminary result)   Collection Time: 05/21/23 10:06 AM   Specimen: BLOOD LEFT ARM  Result Value Ref Range Status   Specimen Description BLOOD LEFT ARM  Final   Special Requests   Final    BOTTLES DRAWN AEROBIC AND ANAEROBIC Blood Culture results may not be optimal due to an inadequate volume of blood received in culture bottles   Culture   Final    NO GROWTH 3 DAYS Performed at [facility], [address] [place], [place] [address]    Report Status PENDING  Incomplete  Culture, blood (Routine X 2) w Reflex to ID Panel     Status: None (Preliminary result)   Collection Time: 05/21/23 10:14 AM   Specimen: BLOOD RIGHT HAND  Result Value Ref Range Status   Specimen  Description BLOOD RIGHT HAND  Final   Special Requests   Final    BOTTLES DRAWN AEROBIC AND ANAEROBIC Blood Culture results may not be optimal due to an inadequate volume of blood received in culture bottles   Culture   Final    NO GROWTH 3 DAYS Performed at [facility], [address] [place], [place] [address]    Report Status PENDING  Incomplete  Respiratory (~20 pathogens) panel by PCR     Status: Abnormal   Collection Time: 05/23/23  5:12 PM   Specimen: Nasopharyngeal Swab; Respiratory  Result Value Ref Range Status   Adenovirus NOT DETECTED NOT DETECTED Final   Coronavirus 229E NOT DETECTED NOT DETECTED Final    Comment: (NOTE) The Coronavirus on the Respiratory Panel, DOES NOT test for the novel  Coronavirus (2019 nCoV)    Coronavirus HKU1 NOT DETECTED NOT DETECTED Final   Coronavirus NL63  DETECTED (A) NOT DETECTED Final   Coronavirus OC43 NOT DETECTED NOT DETECTED Final   Metapneumovirus NOT DETECTED NOT DETECTED Final   Rhinovirus / Enterovirus NOT DETECTED NOT DETECTED Final   Influenza A NOT DETECTED NOT DETECTED Final   Influenza B NOT DETECTED NOT DETECTED Final   Parainfluenza Virus 1 NOT DETECTED NOT DETECTED Final   Parainfluenza Virus 2 NOT DETECTED NOT DETECTED Final   Parainfluenza Virus 3 NOT DETECTED NOT DETECTED Final   Parainfluenza Virus 4 NOT DETECTED NOT DETECTED Final   Respiratory Syncytial Virus NOT DETECTED NOT DETECTED Final   Bordetella pertussis NOT DETECTED NOT DETECTED Final   Bordetella Parapertussis NOT DETECTED NOT DETECTED Final   Chlamydophila pneumoniae NOT DETECTED NOT DETECTED Final   Mycoplasma pneumoniae NOT DETECTED NOT DETECTED Final    Comment: Performed at [facility], [address], [place], [place] [address]  SARS Coronavirus 2 by RT PCR (hospital order, performed in [facility] hospital lab) *cepheid single result test* Anterior Nasal Swab     Status: None   Collection Time: 05/23/23  5:12 PM   Specimen: Anterior  Nasal Swab  Result Value Ref Range Status   SARS Coronavirus 2 by RT PCR NEGATIVE NEGATIVE Final    Comment: Performed at [facility], [address], [place], [place] [address]     Labs: BNP (last 3 results) No results for input(s): "BNP" in the last 8760 hours. Basic Metabolic Panel: Recent Labs  Lab 05/20/23 0432 05/21/23 0547 05/22/23 0724 05/23/23 0552 05/24/23 0644  NA 132* 130* 131* 128* 128*  K 3.5 3.3* 3.4* 2.9* 3.1*  CL 100 102 100 100 102  CO2 22 21* 23 21* 21*  GLUCOSE 119* 105* 89 96 98  BUN 17 24* 25* 20 15  CREATININE 1.12 1.30* 1.19 1.38* 1.31*  CALCIUM 8.4* 8.1* 7.8* 7.8* 7.5*  MG  --   --  1.6* 1.7 1.6*  PHOS  --   --  2.7  --   --    Liver Function Tests: Recent Labs  Lab 05/20/23 0432 05/21/23 0547 05/22/23 0724 05/23/23 0552 05/24/23 0644  AST 38 31 33 42* 41  ALT 23 21 20 26 28   ALKPHOS 86 82 75 81 90  BILITOT 4.6* 3.9* 2.9* 2.4* 2.0*  PROT 6.1* 5.4* 4.7* 5.0* 4.8*  ALBUMIN 2.9* 2.5* 2.3* 2.3* 2.1*   Recent Labs  Lab 05/19/23 1546  LIPASE 38   Recent Labs  Lab 05/19/23 1546 05/21/23 1006 05/22/23 0724  AMMONIA 97* 35 40*   CBC: Recent Labs  Lab 05/19/23 1546 05/20/23 0432 05/22/23 0724 05/22/23 1509 05/23/23 0552 05/23/23 1604 05/24/23 0644  WBC 9.3   < > 11.3* 10.6* 7.6 10.2 8.3  NEUTROABS 6.4  --   --   --   --   --   --   HGB 10.7*   < > 7.8* 9.4* 7.9* 9.2* 7.9*  HCT 29.0*   < > 23.1* 27.3* 22.9* 27.7* 23.3*  MCV 95.1   < > 94.7 96.5 93.9 95.8 94.7  PLT 164   < > 103* 136* 105* 121* 113*   < > = values in this interval not displayed.   Cardiac Enzymes: No results for input(s): "CKTOTAL", "CKMB", "CKMBINDEX", "TROPONINI" in the last 168 hours. BNP: Invalid input(s): "POCBNP" CBG: No results for input(s): "GLUCAP" in the last 168 hours. D-Dimer No results for input(s): "DDIMER" in the last 72 hours. Hgb A1c No results for input(s): "HGBA1C"  in the last 72 hours. Lipid Profile No results for input(s):  "CHOL", "HDL", "LDLCALC", "TRIG", "CHOLHDL", "LDLDIRECT" in the last 72 hours. Thyroid function studies No results for input(s): "TSH", "T4TOTAL", "T3FREE", "THYROIDAB" in the last 72 hours.  Invalid input(s): "FREET3" Anemia work up No results for input(s): "VITAMINB12", "FOLATE", "FERRITIN", "TIBC", "IRON", "RETICCTPCT" in the last 72 hours. Urinalysis    Component Value Date/Time   COLORURINE AMBER (A) 05/19/2023 1524   APPEARANCEUR CLEAR 05/19/2023 1524   LABSPEC 1.018 05/19/2023 1524   PHURINE 5.0 05/19/2023 1524   GLUCOSEU NEGATIVE 05/19/2023 1524   HGBUR MODERATE (A) 05/19/2023 1524   BILIRUBINUR NEGATIVE 05/19/2023 1524   KETONESUR NEGATIVE 05/19/2023 1524   PROTEINUR NEGATIVE 05/19/2023 1524   NITRITE NEGATIVE 05/19/2023 1524   LEUKOCYTESUR NEGATIVE 05/19/2023 1524   Sepsis Labs Recent Labs  Lab 05/22/23 1509 05/23/23 0552 05/23/23 1604 05/24/23 0644  WBC 10.6* 7.6 10.2 8.3   Microbiology Recent Results (from the past 240 hour(s))  Body fluid culture w Gram Stain     Status: None   Collection Time: 05/19/23  8:02 PM   Specimen: Peritoneal Cavity; Peritoneal Fluid  Result Value Ref Range Status   Specimen Description   Final    PERITONEAL CAVITY Performed at [facility], [address], [place], [place] [address]    Special Requests   Final    NONE Performed at [facility], [address], [place], [place] [address]    Gram Stain NO WBC SEEN NO ORGANISMS SEEN   Final   Culture   Final    NO GROWTH 3 DAYS Performed at [facility], [address], [place], [place] [address]    Report Status 05/23/2023 FINAL  Final  Culture, blood (Routine X 2) w Reflex to ID Panel     Status: None (Preliminary result)   Collection Time: 05/21/23 10:06 AM   Specimen: BLOOD LEFT ARM  Result Value Ref Range Status   Specimen Description BLOOD LEFT ARM  Final   Special Requests   Final    BOTTLES DRAWN AEROBIC AND ANAEROBIC  Blood Culture results may not be optimal due to an inadequate volume of blood received in culture bottles   Culture   Final    NO GROWTH 3 DAYS Performed at [facility], [address], [place], [place] [address]    Report Status PENDING  Incomplete  Culture, blood (Routine X 2) w Reflex to ID Panel     Status: None (Preliminary result)   Collection Time: 05/21/23 10:14 AM   Specimen: BLOOD RIGHT HAND  Result Value Ref Range Status   Specimen Description BLOOD RIGHT HAND  Final   Special Requests   Final    BOTTLES DRAWN AEROBIC AND ANAEROBIC Blood Culture results may not be optimal due to an inadequate volume of blood received in culture bottles   Culture   Final    NO GROWTH 3 DAYS Performed at [facility], [address] [address], [place], [place] [address]    Report Status PENDING  Incomplete  Respiratory (~20 pathogens) panel by PCR     Status: Abnormal   Collection Time: 05/23/23  5:12 PM   Specimen: Nasopharyngeal Swab; Respiratory  Result Value Ref Range Status   Adenovirus NOT DETECTED NOT DETECTED Final   Coronavirus 229E NOT DETECTED NOT DETECTED Final    Comment: (NOTE) The Coronavirus on the Respiratory Panel, DOES NOT test for the novel  Coronavirus (2019  nCoV)    Coronavirus HKU1 NOT DETECTED NOT DETECTED Final   Coronavirus NL63 DETECTED (A) NOT DETECTED Final   Coronavirus OC43 NOT DETECTED NOT DETECTED Final   Metapneumovirus NOT DETECTED NOT DETECTED Final   Rhinovirus / Enterovirus NOT DETECTED NOT DETECTED Final   Influenza A NOT DETECTED NOT DETECTED Final   Influenza B NOT DETECTED NOT DETECTED Final   Parainfluenza Virus 1 NOT DETECTED NOT DETECTED Final   Parainfluenza Virus 2 NOT DETECTED NOT DETECTED Final   Parainfluenza Virus 3 NOT DETECTED NOT DETECTED Final   Parainfluenza Virus 4 NOT DETECTED NOT DETECTED Final   Respiratory Syncytial Virus NOT DETECTED NOT DETECTED Final   Bordetella pertussis NOT DETECTED NOT DETECTED Final   Bordetella  Parapertussis NOT DETECTED NOT DETECTED Final   Chlamydophila pneumoniae NOT DETECTED NOT DETECTED Final   Mycoplasma pneumoniae NOT DETECTED NOT DETECTED Final    Comment: Performed at [facility], [address], [place], [place] [address]  SARS Coronavirus 2 by RT PCR (hospital order, performed in [facility] hospital lab) *cepheid single result test* Anterior Nasal Swab     Status: None   Collection Time: 05/23/23  5:12 PM   Specimen: Anterior Nasal Swab  Result Value Ref Range Status   SARS Coronavirus 2 by RT PCR NEGATIVE NEGATIVE Final    Comment: Performed at [facility], [address] [place], [place] [address]     Time coordinating discharge: 32 minutes  SIGNED:   Dorcas Carrow, MD  [facility] 05/24/2023, 11:08 AM

## 2023-05-24 NOTE — Plan of Care (Signed)

## 2023-05-26 LAB — CULTURE, BLOOD (ROUTINE X 2)
Culture: NO GROWTH
Culture: NO GROWTH

## 2023-05-28 ENCOUNTER — Encounter: Payer: Self-pay | Admitting: Gastroenterology

## 2023-05-29 ENCOUNTER — Other Ambulatory Visit (HOSPITAL_BASED_OUTPATIENT_CLINIC_OR_DEPARTMENT_OTHER): Payer: Self-pay

## 2023-05-29 ENCOUNTER — Other Ambulatory Visit: Payer: Self-pay

## 2023-05-29 ENCOUNTER — Other Ambulatory Visit (HOSPITAL_COMMUNITY): Payer: Self-pay

## 2023-05-29 MED ORDER — SUCRALFATE 1 G PO TABS
1.0000 g | ORAL_TABLET | Freq: Three times a day (TID) | ORAL | 0 refills | Status: DC
  Filled 2023-05-29 (×2): qty 56, 14d supply, fill #0

## 2023-05-30 ENCOUNTER — Other Ambulatory Visit: Payer: Self-pay

## 2023-06-02 ENCOUNTER — Ambulatory Visit (HOSPITAL_COMMUNITY): Payer: BLUE CROSS/BLUE SHIELD

## 2023-06-05 ENCOUNTER — Encounter: Payer: Self-pay | Admitting: Gastroenterology

## 2023-06-06 ENCOUNTER — Other Ambulatory Visit (HOSPITAL_COMMUNITY): Payer: Self-pay

## 2023-06-06 ENCOUNTER — Other Ambulatory Visit: Payer: Self-pay

## 2023-06-06 MED ORDER — SUCRALFATE 1 G PO TABS
1.0000 g | ORAL_TABLET | Freq: Three times a day (TID) | ORAL | 0 refills | Status: DC
  Filled 2023-06-06 – 2023-06-12 (×5): qty 120, 30d supply, fill #0

## 2023-06-07 ENCOUNTER — Emergency Department (HOSPITAL_COMMUNITY): Payer: BLUE CROSS/BLUE SHIELD

## 2023-06-07 ENCOUNTER — Ambulatory Visit (HOSPITAL_COMMUNITY)
Admission: RE | Admit: 2023-06-07 | Discharge: 2023-06-07 | Disposition: A | Discharge status: Home / Self Care | Payer: BLUE CROSS/BLUE SHIELD | Source: Ambulatory Visit | Attending: Nurse Practitioner | Admitting: Nurse Practitioner

## 2023-06-07 ENCOUNTER — Encounter (HOSPITAL_COMMUNITY): Payer: Self-pay

## 2023-06-07 ENCOUNTER — Inpatient Hospital Stay (HOSPITAL_COMMUNITY)
Admission: EM | Admit: 2023-06-07 | Discharge: 2023-06-11 | DRG: 443 | Disposition: A | Discharge status: Home / Self Care | Payer: BLUE CROSS/BLUE SHIELD | Attending: Family Medicine | Admitting: Family Medicine

## 2023-06-07 ENCOUNTER — Other Ambulatory Visit: Payer: Self-pay

## 2023-06-07 DIAGNOSIS — Z79899 Other long term (current) drug therapy: Secondary | ICD-10-CM

## 2023-06-07 DIAGNOSIS — K7581 Nonalcoholic steatohepatitis (NASH): Secondary | ICD-10-CM | POA: Diagnosis present

## 2023-06-07 DIAGNOSIS — G9340 Encephalopathy, unspecified: Principal | ICD-10-CM | POA: Diagnosis present

## 2023-06-07 DIAGNOSIS — B9789 Other viral agents as the cause of diseases classified elsewhere: Secondary | ICD-10-CM | POA: Diagnosis present

## 2023-06-07 DIAGNOSIS — Z823 Family history of stroke: Secondary | ICD-10-CM

## 2023-06-07 DIAGNOSIS — J069 Acute upper respiratory infection, unspecified: Secondary | ICD-10-CM | POA: Diagnosis present

## 2023-06-07 DIAGNOSIS — Z6832 Body mass index (BMI) 32.0-32.9, adult: Secondary | ICD-10-CM

## 2023-06-07 DIAGNOSIS — K219 Gastro-esophageal reflux disease without esophagitis: Secondary | ICD-10-CM | POA: Diagnosis present

## 2023-06-07 DIAGNOSIS — F32A Depression, unspecified: Secondary | ICD-10-CM | POA: Diagnosis present

## 2023-06-07 DIAGNOSIS — F419 Anxiety disorder, unspecified: Secondary | ICD-10-CM | POA: Diagnosis present

## 2023-06-07 DIAGNOSIS — E66812 Obesity, class 2: Secondary | ICD-10-CM | POA: Diagnosis present

## 2023-06-07 DIAGNOSIS — Z87891 Personal history of nicotine dependence: Secondary | ICD-10-CM

## 2023-06-07 DIAGNOSIS — Z833 Family history of diabetes mellitus: Secondary | ICD-10-CM

## 2023-06-07 DIAGNOSIS — Z8249 Family history of ischemic heart disease and other diseases of the circulatory system: Secondary | ICD-10-CM

## 2023-06-07 DIAGNOSIS — Z8261 Family history of arthritis: Secondary | ICD-10-CM

## 2023-06-07 DIAGNOSIS — K7460 Unspecified cirrhosis of liver: Secondary | ICD-10-CM | POA: Diagnosis present

## 2023-06-07 DIAGNOSIS — Z1152 Encounter for screening for COVID-19: Secondary | ICD-10-CM

## 2023-06-07 DIAGNOSIS — F1020 Alcohol dependence, uncomplicated: Secondary | ICD-10-CM | POA: Diagnosis present

## 2023-06-07 DIAGNOSIS — K7682 Hepatic encephalopathy: Secondary | ICD-10-CM | POA: Diagnosis not present

## 2023-06-07 DIAGNOSIS — I10 Essential (primary) hypertension: Secondary | ICD-10-CM | POA: Diagnosis present

## 2023-06-07 DIAGNOSIS — L299 Pruritus, unspecified: Secondary | ICD-10-CM | POA: Diagnosis present

## 2023-06-07 DIAGNOSIS — E876 Hypokalemia: Secondary | ICD-10-CM | POA: Diagnosis present

## 2023-06-07 NOTE — ED Triage Notes (Signed)
Pt arrives with family who reports they have not heard from pt since Monday and today they went to the house and pt was confused. Pt reports "I feel like I can't think straight." Pt oriented to self and situation. Pt has hx of cirrhosis and similar personation a couple weeks ago when he was admitted. Pt able to express that he feels fatigued.

## 2023-06-07 NOTE — ED Provider Notes (Signed)
[facility]ERGENCY DEPT [facility] Emergency Department Provider Note MRN:  [id number]  Arrival date & time: 06/08/23     Chief Complaint   Altered Mental Status   History of Present Illness   Erik Howe is a 52 y.o. 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  Family member called a friend of the patient after they had not seen or heard from the patient in several days.  Patient was recently admitted for hepatic encephalopathy.  Patient unable to provide any meaningful hx 2/2 confusion.  Hx provided by chart review and by patient's friend.   Review of Systems  Pertinent positive and negative review of systems noted in HPI.    Physical Exam   Vitals:   06/07/23 2301 06/08/23 0105  BP: (!) 151/83 104/67  Pulse: (!) 101 96  Resp: 18 12  Temp: 98.7 F (37.1 C) 98.4 F (36.9 C)  SpO2: 100% 100%    CONSTITUTIONAL:  chronically ill-appearing NEURO:  Alert and oriented x 1 (to person only), CN 3-12 grossly intact, confused EYES:  eyes equal and reactive ENT/NECK:  Supple, no stridor  CARDIO:  tachycardic, regular rhythm, appears well-perfused  PULM:  No respiratory distress,  GI/GU:  non-distended,  MSK/SPINE:  No gross deformities, no edema, moves all extremities  SKIN:  no rash, atraumatic, dry skin   *Additional and/or pertinent findings included in MDM below  Diagnostic and Interventional Summary    EKG Interpretation Date/Time:  Thursday June 08 2023 01:03:00 EST Ventricular Rate:  95 PR Interval:  164 QRS Duration:  102 QT Interval:  433 QTC Calculation: 545 R Axis:   53  Text Interpretation: Sinus rhythm Posterior infarct, old Minimal ST depression, diffuse leads Prolonged QT interval No acute changes Confirmed by Gilda Crease [phone]) on 06/08/2023 1:07:32 AM       Labs  Reviewed  COMPREHENSIVE METABOLIC PANEL - Abnormal; Notable for the following components:      Result Value   Sodium 131 (*)    Potassium 3.3 (*)    Chloride 95 (*)    Glucose, Bld 108 (*)    Creatinine, Ser 1.26 (*)    Albumin 3.1 (*)    AST 53 (*)    Alkaline Phosphatase 136 (*)    Total Bilirubin 2.8 (*)    All other components within normal limits  CBC - Abnormal; Notable for the following components:   RBC 3.13 (*)    Hemoglobin 10.4 (*)    HCT 29.3 (*)    Platelets 139 (*)    All other components within normal limits  LIPASE, BLOOD - Abnormal; Notable for the following components:   Lipase 61 (*)    All other components within normal limits  AMMONIA - Abnormal; Notable for the following components:   Ammonia 57 (*)    All other components within normal limits  RESPIRATORY PANEL BY PCR  RAPID URINE DRUG SCREEN, HOSP PERFORMED  PROTIME-INR  BLOOD GAS, VENOUS  LACTATE DEHYDROGENASE  PROCALCITONIN  URINALYSIS, ROUTINE W REFLEX MICROSCOPIC  CBG MONITORING, ED    CT HEAD WO CONTRAST ( )  Final Result    DG CHEST PORT 1 VIEW    (Results Pending)    Medications - No data to display   Procedures  /  Critical Care .Critical Care  Performed by: Roxy Horseman, PA-C Authorized by: Roxy Horseman, PA-C  Critical care provider statement:    Critical care time (minutes):  45   Critical care was necessary to treat or prevent imminent or life-threatening deterioration of the following conditions:  Metabolic crisis   Critical care was time spent personally by me on the following activities:  Development of treatment plan with patient or surrogate, discussions with consultants, evaluation of patient's response to treatment, examination of patient, ordering and review of laboratory studies, ordering and review of radiographic studies, ordering and performing treatments and interventions, pulse oximetry, re-evaluation of patient's condition and review of old charts   ED  Course and Medical Decision Making  I have reviewed the triage vital signs, the nursing notes, and pertinent available records from the EMR.  Social Determinants Affecting Complexity of Care: Patient is affected by alcoholism.   ED Course: Clinical Course as of 06/08/23 0138  Thu Jun 08, 2023  0137 Ammonia(!) Elevated, similar presentation to his last admission, though the ammonia is not as high [RB]  0137 CBC(!) No leukocytosis, no fever, no abdominal tenderness, doubt SBP [RB]    Clinical Course User Index [RB] Roxy Horseman, PA-C    Medical Decision Making Patient here with confusion. Oriented to person only. States that today is Monday and when asked where he was, he gave his home address.  Hx of hepatic encephalopathy.  Recent admission for the same.  Abdomen is distended, but non-tender.  Will check labs.  Suspect that patient will need admission.  Amount and/or Complexity of Data Reviewed Labs: ordered. Decision-making details documented in ED Course. Radiology: ordered. ECG/medicine tests: ordered.  Risk Decision regarding hospitalization.         Consultants: I consulted with Hospitalist, Dr. Maisie Fus, who is appreciated for admitting.   Treatment and Plan: Patient's exam and diagnostic results are concerning for hepatic encephalopathy.  Feel that patient will need admission to the hospital for further treatment and evaluation.    Final Clinical Impressions(s) / ED Diagnoses     ICD-10-CM   1. Encephalopathy, unspecified type  G93.40       ED Discharge Orders     None         Discharge Instructions Discussed with and Provided to Patient:   Discharge Instructions   None      Roxy Horseman, PA-C 06/08/23 0139    Gilda Crease, MD 06/08/23 [phone]

## 2023-06-08 ENCOUNTER — Inpatient Hospital Stay (HOSPITAL_COMMUNITY): Payer: BLUE CROSS/BLUE SHIELD

## 2023-06-08 ENCOUNTER — Encounter (HOSPITAL_COMMUNITY): Payer: Self-pay | Admitting: Family Medicine

## 2023-06-08 ENCOUNTER — Emergency Department (HOSPITAL_COMMUNITY): Payer: BLUE CROSS/BLUE SHIELD

## 2023-06-08 DIAGNOSIS — K7581 Nonalcoholic steatohepatitis (NASH): Secondary | ICD-10-CM | POA: Diagnosis present

## 2023-06-08 DIAGNOSIS — K219 Gastro-esophageal reflux disease without esophagitis: Secondary | ICD-10-CM | POA: Diagnosis present

## 2023-06-08 DIAGNOSIS — G9340 Encephalopathy, unspecified: Secondary | ICD-10-CM | POA: Diagnosis not present

## 2023-06-08 DIAGNOSIS — Z1152 Encounter for screening for COVID-19: Secondary | ICD-10-CM | POA: Diagnosis not present

## 2023-06-08 DIAGNOSIS — I10 Essential (primary) hypertension: Secondary | ICD-10-CM | POA: Diagnosis present

## 2023-06-08 DIAGNOSIS — L299 Pruritus, unspecified: Secondary | ICD-10-CM | POA: Diagnosis present

## 2023-06-08 DIAGNOSIS — Z833 Family history of diabetes mellitus: Secondary | ICD-10-CM | POA: Diagnosis not present

## 2023-06-08 DIAGNOSIS — K7460 Unspecified cirrhosis of liver: Secondary | ICD-10-CM | POA: Diagnosis present

## 2023-06-08 DIAGNOSIS — Z823 Family history of stroke: Secondary | ICD-10-CM | POA: Diagnosis not present

## 2023-06-08 DIAGNOSIS — E66812 Obesity, class 2: Secondary | ICD-10-CM | POA: Diagnosis present

## 2023-06-08 DIAGNOSIS — F1020 Alcohol dependence, uncomplicated: Secondary | ICD-10-CM | POA: Diagnosis present

## 2023-06-08 DIAGNOSIS — F419 Anxiety disorder, unspecified: Secondary | ICD-10-CM | POA: Diagnosis present

## 2023-06-08 DIAGNOSIS — Z87891 Personal history of nicotine dependence: Secondary | ICD-10-CM | POA: Diagnosis not present

## 2023-06-08 DIAGNOSIS — E876 Hypokalemia: Secondary | ICD-10-CM | POA: Diagnosis present

## 2023-06-08 DIAGNOSIS — Z6832 Body mass index (BMI) 32.0-32.9, adult: Secondary | ICD-10-CM | POA: Diagnosis not present

## 2023-06-08 DIAGNOSIS — Z8249 Family history of ischemic heart disease and other diseases of the circulatory system: Secondary | ICD-10-CM | POA: Diagnosis not present

## 2023-06-08 DIAGNOSIS — K7682 Hepatic encephalopathy: Secondary | ICD-10-CM | POA: Diagnosis present

## 2023-06-08 DIAGNOSIS — Z8261 Family history of arthritis: Secondary | ICD-10-CM | POA: Diagnosis not present

## 2023-06-08 DIAGNOSIS — B9789 Other viral agents as the cause of diseases classified elsewhere: Secondary | ICD-10-CM | POA: Diagnosis present

## 2023-06-08 DIAGNOSIS — F32A Depression, unspecified: Secondary | ICD-10-CM | POA: Diagnosis present

## 2023-06-08 DIAGNOSIS — Z79899 Other long term (current) drug therapy: Secondary | ICD-10-CM | POA: Diagnosis not present

## 2023-06-08 DIAGNOSIS — J069 Acute upper respiratory infection, unspecified: Secondary | ICD-10-CM | POA: Diagnosis present

## 2023-06-08 LAB — COMPREHENSIVE METABOLIC PANEL
ALT: 31 U/L (ref 0–44)
ALT: 35 U/L (ref 0–44)
AST: 50 U/L — ABNORMAL HIGH (ref 15–41)
AST: 53 U/L — ABNORMAL HIGH (ref 15–41)
Albumin: 2.7 g/dL — ABNORMAL LOW (ref 3.5–5.0)
Albumin: 3.1 g/dL — ABNORMAL LOW (ref 3.5–5.0)
Alkaline Phosphatase: 121 U/L (ref 38–126)
Alkaline Phosphatase: 136 U/L — ABNORMAL HIGH (ref 38–126)
Anion gap: 10 (ref 5–15)
Anion gap: 10 (ref 5–15)
BUN: 14 mg/dL (ref 6–20)
BUN: 14 mg/dL (ref 6–20)
CO2: 26 mmol/L (ref 22–32)
CO2: 26 mmol/L (ref 22–32)
Calcium: 8.6 mg/dL — ABNORMAL LOW (ref 8.9–10.3)
Calcium: 8.9 mg/dL (ref 8.9–10.3)
Chloride: 94 mmol/L — ABNORMAL LOW (ref 98–111)
Chloride: 95 mmol/L — ABNORMAL LOW (ref 98–111)
Creatinine, Ser: 1.12 mg/dL (ref 0.61–1.24)
Creatinine, Ser: 1.26 mg/dL — ABNORMAL HIGH (ref 0.61–1.24)
GFR, Estimated: >60 mL/min (ref >60)
GFR, Estimated: >60 mL/min (ref >60)
Glucose, Bld: 108 mg/dL — ABNORMAL HIGH (ref 70–99)
Glucose, Bld: 95 mg/dL (ref 70–99)
Potassium: 3.3 mmol/L — ABNORMAL LOW (ref 3.5–5.1)
Potassium: 3.3 mmol/L — ABNORMAL LOW (ref 3.5–5.1)
Sodium: 130 mmol/L — ABNORMAL LOW (ref 135–145)
Sodium: 131 mmol/L — ABNORMAL LOW (ref 135–145)
Total Bilirubin: 2.8 mg/dL — ABNORMAL HIGH (ref <1.2)
Total Bilirubin: 2.9 mg/dL — ABNORMAL HIGH (ref <1.2)
Total Protein: 5.8 g/dL — ABNORMAL LOW (ref 6.5–8.1)
Total Protein: 6.6 g/dL (ref 6.5–8.1)

## 2023-06-08 LAB — CBC
HCT: 25.2 % — ABNORMAL LOW (ref 39.0–52.0)
HCT: 26.2 % — ABNORMAL LOW (ref 39.0–52.0)
HCT: 29.3 % — ABNORMAL LOW (ref 39.0–52.0)
Hemoglobin: 10.4 g/dL — ABNORMAL LOW (ref 13.0–17.0)
Hemoglobin: 8.9 g/dL — ABNORMAL LOW (ref 13.0–17.0)
Hemoglobin: 9.4 g/dL — ABNORMAL LOW (ref 13.0–17.0)
MCH: 33.1 pg (ref 26.0–34.0)
MCH: 33.2 pg (ref 26.0–34.0)
MCH: 33.6 pg (ref 26.0–34.0)
MCHC: 35.3 g/dL (ref 30.0–36.0)
MCHC: 35.5 g/dL (ref 30.0–36.0)
MCHC: 35.9 g/dL (ref 30.0–36.0)
MCV: 93.6 fL (ref 80.0–100.0)
MCV: 93.6 fL (ref 80.0–100.0)
MCV: 93.7 fL (ref 80.0–100.0)
Platelets: 123 10*3/uL — ABNORMAL LOW (ref 150–400)
Platelets: 130 10*3/uL — ABNORMAL LOW (ref 150–400)
Platelets: 139 10*3/uL — ABNORMAL LOW (ref 150–400)
RBC: 2.69 MIL/uL — ABNORMAL LOW (ref 4.22–5.81)
RBC: 2.8 MIL/uL — ABNORMAL LOW (ref 4.22–5.81)
RBC: 3.13 MIL/uL — ABNORMAL LOW (ref 4.22–5.81)
RDW: 14.7 % (ref 11.5–15.5)
RDW: 14.7 % (ref 11.5–15.5)
RDW: 14.8 % (ref 11.5–15.5)
WBC: 6 10*3/uL (ref 4.0–10.5)
WBC: 6.3 10*3/uL (ref 4.0–10.5)
WBC: 6.4 10*3/uL (ref 4.0–10.5)
nRBC: 0 % (ref 0.0–0.2)
nRBC: 0 % (ref 0.0–0.2)
nRBC: 0 % (ref 0.0–0.2)

## 2023-06-08 LAB — RESPIRATORY PANEL BY PCR

## 2023-06-08 LAB — BLOOD GAS, VENOUS
Acid-Base Excess: 5.3 mmol/L — ABNORMAL HIGH (ref 0.0–2.0)
Bicarbonate: 29 mmol/L — ABNORMAL HIGH (ref 20.0–28.0)
O2 Saturation: 80.9 %
Patient temperature: 37
pCO2, Ven: 38 mm[Hg] — ABNORMAL LOW (ref 44–60)
pH, Ven: 7.49 — ABNORMAL HIGH (ref 7.25–7.43)
pO2, Ven: 48 mm[Hg] — ABNORMAL HIGH (ref 32–45)

## 2023-06-08 LAB — CREATININE, SERUM
Creatinine, Ser: 1.12 mg/dL (ref 0.61–1.24)
GFR, Estimated: >60 mL/min (ref >60)

## 2023-06-08 LAB — PROTIME-INR
INR: 1.8 — ABNORMAL HIGH (ref 0.8–1.2)
INR: 1.8 — ABNORMAL HIGH (ref 0.8–1.2)
Prothrombin Time: 20.7 s — ABNORMAL HIGH (ref 11.4–15.2)
Prothrombin Time: 20.7 s — ABNORMAL HIGH (ref 11.4–15.2)

## 2023-06-08 LAB — TSH: TSH: 2.7 u[IU]/mL (ref 0.350–4.500)

## 2023-06-08 LAB — PROCALCITONIN: Procalcitonin: <0.1 ng/mL

## 2023-06-08 LAB — AMMONIA: Ammonia: 57 umol/L — ABNORMAL HIGH (ref 9–35)

## 2023-06-08 LAB — LIPASE, BLOOD: Lipase: 61 U/L — ABNORMAL HIGH (ref 11–51)

## 2023-06-08 LAB — CBG MONITORING, ED: Glucose-Capillary: 99 mg/dL (ref 70–99)

## 2023-06-08 LAB — LACTATE DEHYDROGENASE: LDH: 190 U/L (ref 98–192)

## 2023-06-08 MED ORDER — RIFAXIMIN 200 MG PO TABS
200.0000 mg | ORAL_TABLET | Freq: Three times a day (TID) | ORAL | Status: DC
  Administered 2023-06-08 – 2023-06-09 (×6): 200 mg via ORAL
  Filled 2023-06-08 (×7): qty 1

## 2023-06-08 MED ORDER — LACTULOSE 10 GM/15ML PO SOLN
30.0000 g | Freq: Three times a day (TID) | ORAL | Status: DC
  Administered 2023-06-08 – 2023-06-09 (×4): 30 g via ORAL
  Filled 2023-06-08 (×4): qty 60

## 2023-06-08 MED ORDER — ASPIRIN 81 MG PO CHEW: 81.0000 mg | CHEWABLE_TABLET | Freq: Every day | ORAL | Status: DC

## 2023-06-08 MED ORDER — ALBUTEROL SULFATE (2.5 MG/3ML) 0.083% IN NEBU
2.5000 mg | INHALATION_SOLUTION | RESPIRATORY_TRACT | Status: DC | PRN
  Administered 2023-06-10: 2.5 mg via RESPIRATORY_TRACT
  Filled 2023-06-08: qty 3

## 2023-06-08 MED ORDER — HEPARIN SODIUM (PORCINE) 5000 UNIT/ML IJ SOLN
5000.0000 [IU] | Freq: Three times a day (TID) | INTRAMUSCULAR | Status: DC
  Administered 2023-06-08 – 2023-06-11 (×10): 5000 [IU] via SUBCUTANEOUS
  Filled 2023-06-08 (×10): qty 1

## 2023-06-08 NOTE — ED Notes (Signed)
ED TO INPATIENT HANDOFF REPORT  ED Nurse Name and Phone #: Julieth Tugman  S Name/Age/Gender Erik Howe 52 y.o. male Room/Bed: WA23/WA23  Code Status   Code Status: Full Code  Home/SNF/Other Home Patient oriented to: self and place Is this baseline? No   Triage Complete: Triage complete  Chief Complaint Acute encephalopathy [G93.40] Hepatic encephalopathy [facility]) [K76.82]  Triage Note Pt arrives with family who reports they have not heard from pt since Monday and today they went to the house and pt was confused. Pt reports "I feel like I can't think straight." Pt oriented to self and situation. Pt has hx of cirrhosis and similar personation a couple weeks ago when he was admitted. Pt able to express that he feels fatigued.    Allergies Allergies  Allergen Reactions   Tylenol [Acetaminophen] Other (See Comments)    Told to avoid due to liver issues   Dust Mite Extract    Paroxetine Other (See Comments)    Stomach issues, "felt "wrong," and GI Intolerance   Pollen Extract    Tramadol Nausea Only    Level of Care/Admitting Diagnosis ED Disposition     ED Disposition  Admit   Condition  --   Comment  Hospital Area: [facility] [[id number]]  Level of Care: Telemetry [5]  Admit to tele based on following criteria: Other see comments  Comments: expected electrolyte abnormalities that could lead to arrhythmia  May admit patient to [facility] or [facility] if equivalent level of care is available:: Yes  Covid Evaluation: Asymptomatic - no recent exposure (last 10 days) testing not required  Diagnosis: Hepatic encephalopathy (HCC) [572.2.ICD-9-CM]  Admitting Physician: Hughie Closs [[id number]]  Attending Physician: Hughie Closs [[id number]]  Certification:: I certify this patient will need inpatient services for at least 2 midnights          B Medical/Surgery History Past Medical History:  Diagnosis Date   Anxiety    Arthritis    Cellulitis 12/18/2012    left foot   Cirrhosis of liver (HCC)    Depression    GERD (gastroesophageal reflux disease)    Hypertension    no longer a problem   Obesity    Varicose veins    Past Surgical History:  Procedure Laterality Date   BIOPSY  08/25/2022   Procedure: BIOPSY;  Surgeon: Jenel Lucks, MD;  Location: [facility];  Service: Gastroenterology;;   CHOLECYSTECTOMY     COLONOSCOPY WITH PROPOFOL N/A 08/25/2022   Procedure: COLONOSCOPY WITH PROPOFOL;  Surgeon: Jenel Lucks, MD;  Location: [facility];  Service: Gastroenterology;  Laterality: N/A;   ENDOVENOUS ABLATION SAPHENOUS VEIN W/ LASER Left 09-30-2013   left greater saphenous vein   ESOPHAGOGASTRODUODENOSCOPY (EGD) WITH PROPOFOL N/A 08/25/2022   Procedure: ESOPHAGOGASTRODUODENOSCOPY (EGD) WITH PROPOFOL;  Surgeon: Jenel Lucks, MD;  Location: [facility];  Service: Gastroenterology;  Laterality: N/A;   HOT HEMOSTASIS N/A 08/25/2022   Procedure: HOT HEMOSTASIS (ARGON PLASMA COAGULATION/BICAP);  Surgeon: Jenel Lucks, MD;  Location: [facility];  Service: Gastroenterology;  Laterality: N/A;   IR IVUS EACH ADDITIONAL NON CORONARY VESSEL  07/20/2022   IR PARACENTESIS  05/03/2022   IR PARACENTESIS  05/18/2022   IR PARACENTESIS  06/02/2022   IR PARACENTESIS  06/10/2022   IR PARACENTESIS  06/24/2022   IR PARACENTESIS  06/30/2022   IR PARACENTESIS  07/08/2022   IR PARACENTESIS  07/15/2022   IR PARACENTESIS  07/20/2022   IR PARACENTESIS  07/29/2022   IR PARACENTESIS  08/12/2022   IR PARACENTESIS  08/19/2022   IR PARACENTESIS  09/06/2022   IR PARACENTESIS  09/22/2022   IR PARACENTESIS  10/21/2022   IR PARACENTESIS  11/17/2022   IR PARACENTESIS  12/07/2022   IR PARACENTESIS  12/21/2022   IR PARACENTESIS  01/06/2023   IR PARACENTESIS  01/23/2023   IR PARACENTESIS  02/23/2023   IR PARACENTESIS  03/09/2023   IR PARACENTESIS  03/31/2023   IR PARACENTESIS  05/01/2023   IR PARACENTESIS  05/16/2023   IR TIPS  07/20/2022   IR US GUIDE VASC ACCESS  RIGHT  07/20/2022   IR US GUIDE VASC ACCESS RIGHT  07/20/2022   POLYPECTOMY  08/25/2022   Procedure: POLYPECTOMY;  Surgeon: Jenel Lucks, MD;  Location: [facility];  Service: Gastroenterology;;   RADIOLOGY WITH ANESTHESIA N/A 07/20/2022   Procedure: TIPS;  Surgeon: Richarda Overlie, MD;  Location: [facility] OR;  Service: Radiology;  Laterality: N/A;     A IV Location/Drains/Wounds Patient Lines/Drains/Airways Status     Active Line/Drains/Airways     Name Placement date Placement time Site Days   Peripheral IV 06/08/23 20 G 1" Anterior;Distal;Right;Upper Arm 06/08/23  0643  Arm  less than 1   Wound / Incision (Open or Dehisced) 07/20/22 Puncture Thigh Anterior;Proximal;Right 11fr sheat site 07/20/22  1510  Thigh  323   Wound / Incision (Open or Dehisced) 07/20/22 Puncture Abdomen Right;Upper paracentesis site 07/20/22  1512  Abdomen  323   Wound / Incision (Open or Dehisced) 07/20/22 Puncture Throat Right 07/20/22  1513  Throat  323            Intake/Output Last 24 hours No intake or output data in the 24 hours ending 06/08/23 1801  Labs/Imaging Results for orders placed or performed during the hospital encounter of 06/07/23 (from the past 48 hours)  Comprehensive metabolic panel     Status: Abnormal   Collection Time: 06/08/23 12:12 AM  Result Value Ref Range   Sodium 131 (L) 135 - 145 mmol/L   Potassium 3.3 (L) 3.5 - 5.1 mmol/L   Chloride 95 (L) 98 - 111 mmol/L   CO2 26 22 - 32 mmol/L   Glucose, Bld 108 (H) 70 - 99 mg/dL    Comment: Glucose reference range applies only to samples taken after fasting for at least 8 hours.   BUN 14 6 - 20 mg/dL   Creatinine, Ser 4.09 (H) 0.61 - 1.24 mg/dL   Calcium 8.9 8.9 - 81.1 mg/dL   Total Protein 6.6 6.5 - 8.1 g/dL   Albumin 3.1 (L) 3.5 - 5.0 g/dL   AST 53 (H) 15 - 41 U/L   ALT 35 0 - 44 U/L   Alkaline Phosphatase 136 (H) 38 - 126 U/L   Total Bilirubin 2.8 (H) <1.2 mg/dL   GFR, Estimated >91 >47 mL/min    Comment: (NOTE) Calculated using  the CKD-EPI Creatinine Equation (2021)    Anion gap 10 5 - 15    Comment: Performed at [facility], [address], [place], [place] [address]  CBC     Status: Abnormal   Collection Time: 06/08/23 12:12 AM  Result Value Ref Range   WBC 6.3 4.0 - 10.5 K/uL   RBC 3.13 (L) 4.22 - 5.81 MIL/uL   Hemoglobin 10.4 (L) 13.0 - 17.0 g/dL   HCT 21.3 (L) 08.6 - 57.8 %   MCV 93.6 80.0 - 100.0 fL   MCH 33.2 26.0 - 34.0 pg   MCHC 35.5  30.0 - 36.0 g/dL   RDW 16.1 09.6 - 04.5 %   Platelets 139 (L) 150 - 400 K/uL   nRBC 0.0 0.0 - 0.2 %    Comment: Performed at [facility], [address], [place], [place] [address]  Lipase, blood     Status: Abnormal   Collection Time: 06/08/23 12:12 AM  Result Value Ref Range   Lipase 61 (H) 11 - 51 U/L    Comment: Performed at [facility], [address], [place], [place] [address]  Ammonia     Status: Abnormal   Collection Time: 06/08/23 12:12 AM  Result Value Ref Range   Ammonia 57 (H) 9 - 35 umol/L    Comment: Performed at [facility], [address] [place], [place] [address]  CBG monitoring, ED     Status: None   Collection Time: 06/08/23 12:54 AM  Result Value Ref Range   Glucose-Capillary 99 70 - 99 mg/dL    Comment: Glucose reference range applies only to samples taken after fasting for at least 8 hours.  Protime-INR     Status: Abnormal   Collection Time: 06/08/23  1:12 AM  Result Value Ref Range   Prothrombin Time 20.7 (H) 11.4 - 15.2 seconds   INR 1.8 (H) 0.8 - 1.2    Comment: (NOTE) INR goal varies based on device and disease states. Performed at [facility], [address] [place], [place] [address]   Respiratory (~20 pathogens) panel by PCR     Status: Abnormal   Collection Time: 06/08/23  2:11 AM   Specimen: Nasopharyngeal Swab; Respiratory  Result Value Ref Range   Adenovirus NOT DETECTED NOT DETECTED   Coronavirus 229E NOT DETECTED NOT  DETECTED    Comment: (NOTE) The Coronavirus on the Respiratory Panel, DOES NOT test for the novel  Coronavirus (2019 nCoV)    Coronavirus HKU1 NOT DETECTED NOT DETECTED   Coronavirus NL63 NOT DETECTED NOT DETECTED   Coronavirus OC43 NOT DETECTED NOT DETECTED   Metapneumovirus NOT DETECTED NOT DETECTED   Rhinovirus / Enterovirus DETECTED (A) NOT DETECTED   Influenza A NOT DETECTED NOT DETECTED   Influenza B NOT DETECTED NOT DETECTED   Parainfluenza Virus 1 NOT DETECTED NOT DETECTED   Parainfluenza Virus 2 NOT DETECTED NOT DETECTED   Parainfluenza Virus 3 NOT DETECTED NOT DETECTED   Parainfluenza Virus 4 NOT DETECTED NOT DETECTED   Respiratory Syncytial Virus NOT DETECTED NOT DETECTED   Bordetella pertussis NOT DETECTED NOT DETECTED   Bordetella Parapertussis NOT DETECTED NOT DETECTED   Chlamydophila pneumoniae NOT DETECTED NOT DETECTED   Mycoplasma pneumoniae NOT DETECTED NOT DETECTED    Comment: Performed at [facility], [address] [place], [place] [address]  Blood gas, venous     Status: Abnormal   Collection Time: 06/08/23  2:11 AM  Result Value Ref Range   pH, Ven 7.49 (H) 7.25 - 7.43   pCO2, Ven 38 (L) 44 - 60 mmHg   pO2, Ven 48 (H) 32 - 45 mmHg   Bicarbonate 29.0 (H) 20.0 - 28.0 mmol/L   Acid-Base Excess 5.3 (H) 0.0 - 2.0 mmol/L   O2 Saturation 80.9 %   Patient temperature 37.0     Comment: Performed at [facility], [address] [place], [place] [address]  Lactate dehydrogenase     Status: None   Collection Time: 06/08/23  2:11 AM  Result Value Ref Range   LDH  190 98 - 192 U/L    Comment: Performed at [facility], [address], [place], [place] [address]  Procalcitonin     Status: None   Collection Time: 06/08/23  2:11 AM  Result Value Ref Range   Procalcitonin <0.10 ng/mL    Comment:        Interpretation: PCT (Procalcitonin) <= 0.5 ng/mL: Systemic infection (sepsis) is not likely. Local bacterial infection  is possible. (NOTE)       Sepsis PCT Algorithm           Lower Respiratory Tract                                      Infection PCT Algorithm    ----------------------------     ----------------------------         PCT < 0.25 ng/mL                PCT < 0.10 ng/mL          Strongly encourage             Strongly discourage   discontinuation of antibiotics    initiation of antibiotics    ----------------------------     -----------------------------       PCT 0.25 - 0.50 ng/mL            PCT 0.10 - 0.25 ng/mL               OR       >80% decrease in PCT            Discourage initiation of                                            antibiotics      Encourage discontinuation           of antibiotics    ----------------------------     -----------------------------         PCT >= 0.50 ng/mL              PCT 0.26 - 0.50 ng/mL               AND        <80% decrease in PCT             Encourage initiation of                                             antibiotics       Encourage continuation           of antibiotics    ----------------------------     -----------------------------        PCT >= 0.50 ng/mL                  PCT > 0.50 ng/mL               AND         increase in PCT                  Strongly encourage  initiation of antibiotics    Strongly encourage escalation           of antibiotics                                     -----------------------------                                           PCT <= 0.25 ng/mL                                                 OR                                        > 80% decrease in PCT                                      Discontinue / Do not initiate                                             antibiotics  Performed at [facility], [address] [place], [place] [address]   CBC     Status: Abnormal   Collection Time: 06/08/23  2:11 AM  Result Value Ref Range   WBC 6.4 4.0 -  10.5 K/uL   RBC 2.69 (L) 4.22 - 5.81 MIL/uL   Hemoglobin 8.9 (L) 13.0 - 17.0 g/dL   HCT 57.8 (L) 46.9 - 62.9 %   MCV 93.7 80.0 - 100.0 fL   MCH 33.1 26.0 - 34.0 pg   MCHC 35.3 30.0 - 36.0 g/dL   RDW 52.8 41.3 - 24.4 %   Platelets 123 (L) 150 - 400 K/uL   nRBC 0.0 0.0 - 0.2 %    Comment: Performed at [facility], [address], [place], [place] [address]  Creatinine, serum     Status: None   Collection Time: 06/08/23  2:11 AM  Result Value Ref Range   Creatinine, Ser 1.12 0.61 - 1.24 mg/dL   GFR, Estimated >25 >36 mL/min    Comment: (NOTE) Calculated using the CKD-EPI Creatinine Equation (2021) Performed at [facility], [address] [place], [place] [address]   TSH     Status: None   Collection Time: 06/08/23  2:11 AM  Result Value Ref Range   TSH 2.700 0.350 - 4.500 uIU/mL    Comment: Performed by a 3rd Generation assay with a functional sensitivity of <=0.01 uIU/mL. Performed at [facility], [address] [place], [place] [address]   CBC     Status: Abnormal   Collection Time: 06/08/23  6:46 AM  Result Value Ref Range   WBC 6.0 4.0 - 10.5 K/uL   RBC 2.80 (L) 4.22 - 5.81 MIL/uL   Hemoglobin 9.4 (L) 13.0 - 17.0 g/dL   HCT 95.6 (L) 38.7 -  52.0 %   MCV 93.6 80.0 - 100.0 fL   MCH 33.6 26.0 - 34.0 pg   MCHC 35.9 30.0 - 36.0 g/dL   RDW 76.2 83.1 - 51.7 %   Platelets 130 (L) 150 - 400 K/uL   nRBC 0.0 0.0 - 0.2 %    Comment: Performed at [facility], [address], [place], [place] [address]  Comprehensive metabolic panel     Status: Abnormal   Collection Time: 06/08/23  6:46 AM  Result Value Ref Range   Sodium 130 (L) 135 - 145 mmol/L   Potassium 3.3 (L) 3.5 - 5.1 mmol/L   Chloride 94 (L) 98 - 111 mmol/L   CO2 26 22 - 32 mmol/L   Glucose, Bld 95 70 - 99 mg/dL    Comment: Glucose reference range applies only to samples taken after fasting for at least 8 hours.   BUN 14 6 - 20 mg/dL    Creatinine, Ser 3.71 0.61 - 1.24 mg/dL   Calcium 8.6 (L) 8.9 - 10.3 mg/dL   Total Protein 5.8 (L) 6.5 - 8.1 g/dL   Albumin 2.7 (L) 3.5 - 5.0 g/dL   AST 50 (H) 15 - 41 U/L   ALT 31 0 - 44 U/L   Alkaline Phosphatase 121 38 - 126 U/L   Total Bilirubin 2.9 (H) <1.2 mg/dL   GFR, Estimated >06 >26 mL/min    Comment: (NOTE) Calculated using the CKD-EPI Creatinine Equation (2021)    Anion gap 10 5 - 15    Comment: Performed at [facility], [address] [place], [place] [address]  Protime-INR     Status: Abnormal   Collection Time: 06/08/23  6:46 AM  Result Value Ref Range   Prothrombin Time 20.7 (H) 11.4 - 15.2 seconds   INR 1.8 (H) 0.8 - 1.2    Comment: (NOTE) INR goal varies based on device and disease states. Performed at [facility], [address] [place], [place] [address]    MR BRAIN WO CONTRAST Result Date: 06/08/2023 CLINICAL DATA:  Mental status change with unknown cause. EXAM: MRI HEAD WITHOUT CONTRAST TECHNIQUE: Multiplanar, multiecho pulse sequences of the brain and surrounding structures were obtained without intravenous contrast. COMPARISON:  Yesterday FINDINGS: Brain: No acute infarction, hemorrhage, hydrocephalus, extra-axial collection or mass lesion. No white matter disease or atrophy. Vascular: Normal flow voids. Skull and upper cervical spine: Normal marrow signal. Sinuses/Orbits: Negative. Other: Intermittent motion artifact. IMPRESSION: Negative motion degraded brain MRI. Electronically Signed   By: Tiburcio Pea M.D.   On: 06/08/2023 05:30   DG CHEST PORT 1 VIEW Result Date: 06/08/2023 CLINICAL DATA:  Altered mental status EXAM: PORTABLE CHEST 1 VIEW COMPARISON:  05/19/2023 FINDINGS: Normal cardiomediastinal silhouette. Low lung volumes. No focal consolidation, pleural effusion, or pneumothorax. No displaced rib fractures. IMPRESSION: No acute cardiopulmonary disease. Electronically Signed   By: Minerva Fester M.D.   On:  06/08/2023 01:43   CT HEAD WO CONTRAST ( ) Result Date: 06/08/2023 CLINICAL DATA:  Delirium EXAM: CT HEAD WITHOUT CONTRAST TECHNIQUE: Contiguous axial images were obtained from the base of the skull through the vertex without intravenous contrast. RADIATION DOSE REDUCTION: This exam was performed according to the departmental dose-optimization program which includes automated exposure control, adjustment of the mA and/or kV according to patient size and/or use of iterative reconstruction technique. COMPARISON:  CT brain 05/19/2023 FINDINGS: Brain: No evidence of acute infarction, hemorrhage, hydrocephalus, extra-axial collection or mass lesion/mass effect. Vascular: No hyperdense  vessel or unexpected calcification. Skull: Normal. Negative for fracture or focal lesion. Sinuses/Orbits: No acute finding. Other: None IMPRESSION: Negative non contrasted CT appearance of the brain. Electronically Signed   By: Jasmine Pang M.D.   On: 06/08/2023 00:03    Pending Labs Unresulted Labs (From admission, onward)     Start     Ordered   06/09/23 0500  CBC with Differential/Platelet  Tomorrow morning,   R        06/08/23 1114   06/09/23 0500  Comprehensive metabolic panel  Tomorrow morning,   R        06/08/23 1114   06/08/23 0122  Urinalysis, Routine w reflex microscopic -Urine, Clean Catch  Once,   URGENT       Question:  Specimen Source  Answer:  Urine, Clean Catch   06/08/23 0121   06/07/23 2316  Rapid urine drug screen (hospital performed)  ONCE - STAT,   STAT        06/07/23 2315            Vitals/Pain Today's Vitals   06/08/23 0819 06/08/23 0940 06/08/23 1343 06/08/23 1400  BP:  120/74  (!) 146/64  Pulse: 99 94  (!) 108  Resp: 11 11  15   Temp: 98.3 F (36.8 C) 98.5 F (36.9 C) 98.9 F (37.2 C)   TempSrc:   Oral   SpO2: 100% 99%  100%  PainSc:        Isolation Precautions Droplet precaution  Medications Medications  heparin injection 5,000 Units (5,000 Units Subcutaneous  Given 06/08/23 1444)  albuterol (PROVENTIL) (2.5 MG/3ML) 0.083% nebulizer solution 2.5 mg (has no administration in time range)  lactulose (CHRONULAC) 10 GM/15ML solution 30 g (30 g Oral Given 06/08/23 1649)  rifaximin (XIFAXAN) tablet 200 mg (200 mg Oral Given 06/08/23 1649)    Mobility walks     Focused Assessments Encephalopathy    R Recommendations: See Admitting Provider Note  Report given to:   Additional Notes:

## 2023-06-08 NOTE — Progress Notes (Signed)
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  Patient seen and examined in the ED.  Patient was alert and oriented to self only.  Patient kept repeating himself and telling me his date of birth for any question I was asking regarding where he is at, current month and current year.  Although he is following commands.  He has been tested positive for rhinovirus as well but he does not have any respiratory symptoms.  His encephalopathy is likely contributed from viral infection as well.  We will continue supportive care for viral URI and continue lactulose and rest of the management.  He is hemodynamically stable.  Potassium is slightly low which we will replenish.  Sodium at baseline.

## 2023-06-08 NOTE — H&P (Addendum)
History and Physical    Erik Howe MVH:[id number] DOB: 1970/11/02 DOA: 06/07/2023  PCP: [name], PA-C  Patient coming from: home  I have personally briefly reviewed patient's old medical records in [facility]  Chief Complaint: acute encephalopathy  HPI: Erik Howe is a 52 y.o. 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 Patient remains confused and is not able to give history. Of note patient has interim history of admission  11/29-12/4 with diagnosis of acute hepatic encephalopathy  for which he treated with lactulose with good response. Patient currently is very confused he is alert to self only. He is not able to give any meaning full history. He notes no pain , n/v/d  he does note pruritus. He denies sob/or chest pain.   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   Past Medical History:  Diagnosis Date   Anxiety    Arthritis    Cellulitis 12/18/2012   left foot   Cirrhosis of liver (HCC)    Depression    GERD (gastroesophageal reflux disease)    Hypertension    no longer a problem   Obesity    Varicose veins     Past Surgical History:  Procedure Laterality Date   BIOPSY  08/25/2022   Procedure: BIOPSY;  Surgeon: Jenel Lucks, MD;  Location: [facility];  Service: Gastroenterology;;   CHOLECYSTECTOMY     COLONOSCOPY WITH PROPOFOL N/A 08/25/2022   Procedure: COLONOSCOPY WITH PROPOFOL;  Surgeon: Jenel Lucks, MD;   Location: [facility];  Service: Gastroenterology;  Laterality: N/A;   ENDOVENOUS ABLATION SAPHENOUS VEIN W/ LASER Left 09-30-2013   left greater saphenous vein   ESOPHAGOGASTRODUODENOSCOPY (EGD) WITH PROPOFOL N/A 08/25/2022   Procedure: ESOPHAGOGASTRODUODENOSCOPY (EGD) WITH PROPOFOL;  Surgeon: Jenel Lucks, MD;  Location: [facility];  Service: Gastroenterology;  Laterality: N/A;   HOT HEMOSTASIS N/A 08/25/2022   Procedure: HOT HEMOSTASIS (ARGON PLASMA COAGULATION/BICAP);  Surgeon: Jenel Lucks, MD;  Location: [facility];  Service: Gastroenterology;  Laterality: N/A;   IR IVUS EACH ADDITIONAL NON CORONARY VESSEL  07/20/2022   IR PARACENTESIS  05/03/2022   IR PARACENTESIS  05/18/2022   IR PARACENTESIS  06/02/2022   IR PARACENTESIS  06/10/2022   IR PARACENTESIS  06/24/2022   IR PARACENTESIS  06/30/2022   IR PARACENTESIS  07/08/2022   IR PARACENTESIS  07/15/2022   IR PARACENTESIS  07/20/2022   IR PARACENTESIS  07/29/2022   IR PARACENTESIS  08/12/2022   IR PARACENTESIS  08/19/2022   IR PARACENTESIS  09/06/2022   IR PARACENTESIS  09/22/2022   IR PARACENTESIS  10/21/2022   IR PARACENTESIS  11/17/2022   IR PARACENTESIS  12/07/2022   IR PARACENTESIS  12/21/2022   IR PARACENTESIS  01/06/2023   IR PARACENTESIS  01/23/2023   IR PARACENTESIS  02/23/2023   IR PARACENTESIS  03/09/2023   IR PARACENTESIS  03/31/2023   IR PARACENTESIS  05/01/2023  IR PARACENTESIS  05/16/2023   IR TIPS  07/20/2022   IR US GUIDE VASC ACCESS RIGHT  07/20/2022   IR US GUIDE VASC ACCESS RIGHT  07/20/2022   POLYPECTOMY  08/25/2022   Procedure: POLYPECTOMY;  Surgeon: Jenel Lucks, MD;  Location: [facility];  Service: Gastroenterology;;   RADIOLOGY WITH ANESTHESIA N/A 07/20/2022   Procedure: TIPS;  Surgeon: Richarda Overlie, MD;  Location: [facility];  Service: Radiology;  Laterality: N/A;     reports that he quit smoking about 10 years ago. His smoking use included cigarettes. He has never used smokeless tobacco. He reports that he  does not drink alcohol and does not use drugs.  Allergies  Allergen Reactions   Tylenol [Acetaminophen] Other (See Comments)    Told to avoid due to liver issues   Dust Mite Extract    Paroxetine Other (See Comments)    Stomach issues, "felt "wrong," and GI Intolerance   Pollen Extract    Tramadol Nausea Only    Family History  Problem Relation Age of Onset   Varicose Veins Mother    Heart disease Mother    Diabetes Mother    Arthritis Mother    Hypertension Mother    Stroke Father    Heart disease Father    Colon cancer Neg Hx    Stomach cancer Neg Hx    Rectal cancer Neg Hx    Esophageal cancer Neg Hx     Prior to Admission medications   Medication Sig Start Date End Date Taking? Authorizing Provider  albuterol (VENTOLIN HFA) 108 (90 Base) MCG/ACT inhaler Inhale 2 puffs into the lungs every 6 (six) hours as needed for shortness of breath 04/11/22     amitriptyline (ELAVIL) 10 MG tablet Take 1 tablet (10 mg total) by mouth at bedtime. 09/15/22     ammonium lactate (LAC-HYDRIN) 12 % lotion Apply topically as needed for dry skin. Twice a day 03/02/23     Baclofen 5 MG TABS Take 2 tablets (10 mg total) by mouth 3 (three) times daily. 02/02/23   Drazek, Dawn, CRNP  bismuth subsalicylate (PEPTO BISMOL) 262 MG/15ML suspension Take 30 mLs by mouth every 6 (six) hours as needed for indigestion.    [provider]  clobetasol ointment (TEMOVATE) 0.05 % Apply topically 2 (two) times a day. To affected area (right hand) 03/02/23     diphenhydrAMINE (BENADRYL) 50 MG tablet Take 50 mg by mouth every 8 (eight) hours as needed for itching or allergies.    [provider]  docusate sodium (COLACE) 100 MG capsule Take 200 mg by mouth daily as needed (constipation.).    [provider]  IVERMECTIN PO Take 3 mg by mouth.    [provider]  lactulose (CHRONULAC) 10 GM/15ML solution Take 3.33 g by mouth 3 (three) times daily.    [provider]  magnesium  oxide (MAG-OX) 400 MG tablet Take 1 tablet (400 mg total) by mouth daily. 05/24/23 06/23/23  Dorcas Carrow, MD  ondansetron (ZOFRAN-ODT) 8 MG disintegrating tablet Take 1 tablet (8 mg total) by mouth 3 (three) times daily as needed for nausea. 06/07/22     pantoprazole (PROTONIX) 40 MG tablet Take 1 tablet (40 mg total) by mouth 2 (two) times daily. 05/24/23 06/23/23  Dorcas Carrow, MD  potassium chloride SA (KLOR-CON M) 20 MEQ tablet Take 2 tablets (40 mEq total) by mouth daily. 09/26/22   Jenel Lucks, MD  silver sulfADIAZINE (SILVADENE) 1 % cream Apply topically 2 (  two) times a day. To affected areas 03/02/23     spironolactone (ALDACTONE) 100 MG tablet Take 2 tablets (200 mg total) by mouth daily. 05/24/23   Dorcas Carrow, MD  sucralfate (CARAFATE) 1 g tablet Take 1 tablet (1 g total) by mouth 4 (four) times daily -  with meals and at bedtime. Please dissolve the tablet in a tablespoon of water and drink the liquid 4 times a day 06/06/23   Jenel Lucks, MD  torsemide (DEMADEX) 20 MG tablet Take 2 tablets (40 mg total) by mouth daily. 05/25/23 06/24/23  Dorcas Carrow, MD    Physical Exam: Vitals:   06/07/23 2301 06/08/23 0105  BP: (!) 151/83 104/67  Pulse: (!) 101 96  Resp: 18 12  Temp: 98.7 F (37.1 C) 98.4 F (36.9 C)  TempSrc: Oral Oral  SpO2: 100% 100%    Constitutional: NAD, calm, comfortable Vitals:   06/07/23 2301 06/08/23 0105  BP: (!) 151/83 104/67  Pulse: (!) 101 96  Resp: 18 12  Temp: 98.7 F (37.1 C) 98.4 F (36.9 C)  TempSrc: Oral Oral  SpO2: 100% 100%   Eyes: PERRL, lids slightly icteric ENMT: Mucous membranes are moist. Posterior pharynx clear of any exudate or lesions.Normal dentition.  Neck: normal, supple, no masses, no thyromegaly Respiratory: clear to auscultation bilaterally, no wheezing, no crackles. Normal respiratory effort. No accessory muscle use.  Cardiovascular: Regular rate and rhythm, no murmurs / rubs / gallops. No extremity edema. 2+  pedal pulses.  Abdomen: + tenderness, no masses palpated. No hepatosplenomegaly. Bowel sounds positive.  Musculoskeletal: no clubbing / cyanosis. No joint deformity upper and lower extremities. Good ROM, no contractures. Normal muscle tone.  Skin: no rashes, lesions, ulcers. No induration Neurologic: CN 2-12 grossly intact. Sensation intact, [id number] Psychiatric: Normal judgment and insight. Alert and oriented to self. Normal mood.    Labs on Admission: I have personally reviewed following labs and imaging studies  CBC: Recent Labs  Lab 06/08/23 0012  WBC 6.3  HGB 10.4*  HCT 29.3*  MCV 93.6  PLT 139*   Basic Metabolic Panel: Recent Labs  Lab 06/08/23 0012  NA 131*  K 3.3*  CL 95*  CO2 26  GLUCOSE 108*  BUN 14  CREATININE 1.26*  CALCIUM 8.9   GFR: CrCl cannot be calculated (Unknown ideal weight.). Liver Function Tests: Recent Labs  Lab 06/08/23 0012  AST 53*  ALT 35  ALKPHOS 136*  BILITOT 2.8*  PROT 6.6  ALBUMIN 3.1*   Recent Labs  Lab 06/08/23 0012  LIPASE 61*   Recent Labs  Lab 06/08/23 0012  AMMONIA 57*   Coagulation Profile: No results for input(s): "INR", "PROTIME" in the last 168 hours. Cardiac Enzymes: No results for input(s): "CKTOTAL", "CKMB", "CKMBINDEX", "TROPONINI" in the last 168 hours. BNP (last 3 results) No results for input(s): "PROBNP" in the last 8760 hours. HbA1C: No results for input(s): "HGBA1C" in the last 72 hours. CBG: Recent Labs  Lab 06/08/23 0054  GLUCAP 99   Lipid Profile: No results for input(s): "CHOL", "HDL", "LDLCALC", "TRIG", "CHOLHDL", "LDLDIRECT" in the last 72 hours. Thyroid Function Tests: No results for input(s): "TSH", "T4TOTAL", "FREET4", "T3FREE", "THYROIDAB" in the last 72 hours. Anemia Panel: No results for input(s): "VITAMINB12", "FOLATE", "FERRITIN", "TIBC", "IRON", "RETICCTPCT" in the last 72 hours. Urine analysis:    Component Value Date/Time   COLORURINE AMBER (A) 05/19/2023 1524    APPEARANCEUR CLEAR 05/19/2023 1524   LABSPEC 1.018 05/19/2023 1524   PHURINE 5.0 05/19/2023 1524  GLUCOSEU NEGATIVE 05/19/2023 1524   HGBUR MODERATE (A) 05/19/2023 1524   BILIRUBINUR NEGATIVE 05/19/2023 1524   KETONESUR NEGATIVE 05/19/2023 1524   PROTEINUR NEGATIVE 05/19/2023 1524   NITRITE NEGATIVE 05/19/2023 1524   LEUKOCYTESUR NEGATIVE 05/19/2023 1524    Radiological Exams on Admission: CT HEAD WO CONTRAST ( ) Result Date: 06/08/2023 CLINICAL DATA:  Delirium EXAM: CT HEAD WITHOUT CONTRAST TECHNIQUE: Contiguous axial images were obtained from the base of the skull through the vertex without intravenous contrast. RADIATION DOSE REDUCTION: This exam was performed according to the departmental dose-optimization program which includes automated exposure control, adjustment of the mA and/or kV according to patient size and/or use of iterative reconstruction technique. COMPARISON:  CT brain 05/19/2023 FINDINGS: Brain: No evidence of acute infarction, hemorrhage, hydrocephalus, extra-axial collection or mass lesion/mass effect. Vascular: No hyperdense vessel or unexpected calcification. Skull: Normal. Negative for fracture or focal lesion. Sinuses/Orbits: No acute finding. Other: None IMPRESSION: Negative non contrasted CT appearance of the brain. Electronically Signed   By: Jasmine Pang M.D.   On: 06/08/2023 00:03    EKG: Independently reviewed.   Assessment/Plan  Acute Encephalopathy  -presumed hepatic encephalopathy -ammonia 57 up from 40 -will resume lactulose and start rifixamin  -CT /MRI head negative /vbg/UA/cxr - place on neuro checks     NASH cirrhosis s/p TIPS with recurrent ascites  -still requiring periodic large-volume paracentesis -undergoing [facility] -resume Lasix 80 mg twice daily and Aldactone 20 mg daily   Pruritus  -benadryl cream  - hold oral benadryl currently due to current mental status and concern for  sideffect  Hypertension -borderline  -resume lasix and aldactone  - monitor trend  and add additional agent as needed  -prn  Anxiety and depression -no active issue note currently  - not on medications at this time  Mild hypokalemia -replete prn   DVT prophylaxis: heparin Code Status: full/ as discussed per patient wishes in event of cardiac arrest  Family Communication: none at bedside Disposition Plan: patient  expected to be admitted greater than 2 midnights  Consults called: n/a Admission status: progressive care   Lurline Del MD [facility]   If 7PM-7AM, please contact night-coverage [email or web address] Password [email or web address]  06/08/2023, 1:19 AM

## 2023-06-09 DIAGNOSIS — G9340 Encephalopathy, unspecified: Secondary | ICD-10-CM | POA: Diagnosis not present

## 2023-06-09 LAB — CBC WITH DIFFERENTIAL/PLATELET
Abs Immature Granulocytes: 0 10*3/uL (ref 0.00–0.07)
Basophils Absolute: 0.1 10*3/uL (ref 0.0–0.1)
Basophils Relative: 1 %
Eosinophils Absolute: 0.3 10*3/uL (ref 0.0–0.5)
Eosinophils Relative: 5 %
HCT: 25.5 % — ABNORMAL LOW (ref 39.0–52.0)
Hemoglobin: 8.5 g/dL — ABNORMAL LOW (ref 13.0–17.0)
Immature Granulocytes: 0 %
Lymphocytes Relative: 27 %
Lymphs Abs: 1.6 10*3/uL (ref 0.7–4.0)
MCH: 32.4 pg (ref 26.0–34.0)
MCHC: 33.3 g/dL (ref 30.0–36.0)
MCV: 97.3 fL (ref 80.0–100.0)
Monocytes Absolute: 0.8 10*3/uL (ref 0.1–1.0)
Monocytes Relative: 13 %
Neutro Abs: 3.2 10*3/uL (ref 1.7–7.7)
Neutrophils Relative %: 54 %
Platelets: 120 10*3/uL — ABNORMAL LOW (ref 150–400)
RBC: 2.62 MIL/uL — ABNORMAL LOW (ref 4.22–5.81)
RDW: 15.1 % (ref 11.5–15.5)
WBC: 5.8 10*3/uL (ref 4.0–10.5)
nRBC: 0 % (ref 0.0–0.2)

## 2023-06-09 LAB — COMPREHENSIVE METABOLIC PANEL
ALT: 28 U/L (ref 0–44)
AST: 48 U/L — ABNORMAL HIGH (ref 15–41)
Albumin: 2.5 g/dL — ABNORMAL LOW (ref 3.5–5.0)
Alkaline Phosphatase: 109 U/L (ref 38–126)
Anion gap: 10 (ref 5–15)
BUN: 13 mg/dL (ref 6–20)
CO2: 22 mmol/L (ref 22–32)
Calcium: 8.1 mg/dL — ABNORMAL LOW (ref 8.9–10.3)
Chloride: 95 mmol/L — ABNORMAL LOW (ref 98–111)
Creatinine, Ser: 1.21 mg/dL (ref 0.61–1.24)
GFR, Estimated: >60 mL/min (ref >60)
Glucose, Bld: 91 mg/dL (ref 70–99)
Potassium: 3.2 mmol/L — ABNORMAL LOW (ref 3.5–5.1)
Sodium: 127 mmol/L — ABNORMAL LOW (ref 135–145)
Total Bilirubin: 3 mg/dL — ABNORMAL HIGH (ref <1.2)
Total Protein: 5.3 g/dL — ABNORMAL LOW (ref 6.5–8.1)

## 2023-06-09 LAB — LACTIC ACID, PLASMA: Lactic Acid, Venous: 2.8 mmol/L (ref 0.5–1.9)

## 2023-06-09 LAB — AMMONIA: Ammonia: 77 umol/L — ABNORMAL HIGH (ref 9–35)

## 2023-06-09 MED ORDER — POTASSIUM CHLORIDE CRYS ER 20 MEQ PO TBCR
40.0000 meq | EXTENDED_RELEASE_TABLET | ORAL | Status: AC
Start: 2023-06-09 — End: 2023-06-09
  Administered 2023-06-09 (×2): 40 meq via ORAL
  Filled 2023-06-09 (×2): qty 2

## 2023-06-09 MED ORDER — ORAL CARE MOUTH RINSE: 15.0000 mL | OROMUCOSAL | Status: DC | PRN

## 2023-06-09 MED ORDER — SPIRONOLACTONE 25 MG PO TABS
200.0000 mg | ORAL_TABLET | Freq: Every day | ORAL | Status: DC
  Administered 2023-06-10 – 2023-06-11 (×2): 200 mg via ORAL
  Filled 2023-06-09 (×2): qty 8

## 2023-06-09 MED ORDER — PANTOPRAZOLE SODIUM 40 MG PO TBEC
40.0000 mg | DELAYED_RELEASE_TABLET | Freq: Two times a day (BID) | ORAL | Status: DC
  Administered 2023-06-09 – 2023-06-11 (×5): 40 mg via ORAL
  Filled 2023-06-09 (×5): qty 1

## 2023-06-09 MED ORDER — FLUCONAZOLE 100 MG PO TABS
200.0000 mg | ORAL_TABLET | Freq: Every day | ORAL | Status: DC
Start: 2023-06-09 — End: 2023-06-09

## 2023-06-09 MED ORDER — DOCUSATE SODIUM 100 MG PO CAPS
200.0000 mg | ORAL_CAPSULE | Freq: Every day | ORAL | Status: DC | PRN
Start: 2023-06-09 — End: 2023-06-11

## 2023-06-09 MED ORDER — HYDROCORTISONE 1 % EX CREA
TOPICAL_CREAM | CUTANEOUS | Status: DC | PRN
  Administered 2023-06-10: 1 via TOPICAL
  Filled 2023-06-09 (×2): qty 28

## 2023-06-09 MED ORDER — ALBUTEROL SULFATE HFA 108 (90 BASE) MCG/ACT IN AERS: 2.0000 | INHALATION_SPRAY | Freq: Four times a day (QID) | RESPIRATORY_TRACT | Status: DC | PRN

## 2023-06-09 MED ORDER — MIDODRINE HCL 5 MG PO TABS
5.0000 mg | ORAL_TABLET | Freq: Three times a day (TID) | ORAL | Status: DC
  Administered 2023-06-09 – 2023-06-10 (×4): 5 mg via ORAL
  Filled 2023-06-09 (×7): qty 1

## 2023-06-09 MED ORDER — LACTULOSE 10 GM/15ML PO SOLN
30.0000 g | Freq: Four times a day (QID) | ORAL | Status: DC
  Administered 2023-06-09 – 2023-06-11 (×7): 30 g via ORAL
  Filled 2023-06-09 (×8): qty 60

## 2023-06-09 NOTE — Evaluation (Signed)
Occupational Therapy Evaluation Patient Details Name: Erik Howe MRN: [id number] DOB: 10-Oct-1970 Today's Date: 06/09/2023   History of Present Illness 52 yr old male admitted to the hospital 06-07-23 due to  AMS. Pt was fount to have acute encephalopathy attributed to elevated ammonia levels and rhinovirus. Pt had recent hospitalization 11/29-12/4 due to acute hepatic encephalopathy. Pt PMH includes but is not limited to: anxiety, arthritis, NASH cirrhosis of liver s/p TIPS, depression, GERD, HTN, varicose veins, and multiple IR paracentesis.   Clinical Impression   The pt is currently presenting well below his baseline level of functioning for self-care management, as he is limited by the below listed deficits (see OT problem list). Per his sister, he is typically independent with ADLs, driving, and ambulation. During the OT eval today, he required mod assist for supine to sit, mod assist for lower body dressing, min assist to stand using a RW, and min assist for taking lateral steps towards the head of the bed using a RW. He required frequent repetition of prompts, assist for problem solving, cues for attention to tasks, cues for memory/recall and increased time for cognitive processing, given his acute AMS/confusion/disorientation. His sister stated he had a similar cognitive presentation during a recent hospitalization, with his cognition eventually improving significantly, which she is hopeful will happen this time as well. OT anticipates he would do much better functionally, pending improvement in his overall cognition. At current, he would not be safe to return home alone, therefore short-term inpatient rehab services may be warranted. OT will follow during his hospital stay and update discharge recommendations as appropriate.       If plan is discharge home, recommend the following: Supervision due to cognitive status;Direct supervision/assist for medications management;Direct  supervision/assist for financial management;Assist for transportation;A lot of help with bathing/dressing/bathroom    Functional Status Assessment  Patient has had a recent decline in their functional status and demonstrates the ability to make significant improvements in function in a reasonable and predictable amount of time.  Equipment Recommendations  Other (comment) (to be determined pending functional progress)    Recommendations for Other Services       Precautions / Restrictions Precautions Precautions: None Precaution Comments: denies h/o falls Restrictions Weight Bearing Restrictions Per Provider Order: No      Mobility Bed Mobility Overal bed mobility: Needs Assistance Bed Mobility: Supine to Sit, Sit to Supine     Supine to sit: Mod assist, Used rails, HOB elevated Sit to supine: Min assist, HOB elevated, Used rails        Transfers Overall transfer level: Needs assistance Equipment used: Rolling walker (2 wheels) Transfers: Sit to/from Stand Sit to Stand: From elevated surface, Min assist, +2 safety/equipment                  Balance     Sitting balance-Leahy Scale: Fair       Standing balance-Leahy Scale: Poor             ADL either performed or assessed with clinical judgement   ADL Overall ADL's : Needs assistance/impaired Eating/Feeding: Set up;Supervision/ safety;Sitting   Grooming: Minimal assistance;Sitting Grooming Details (indicate cue type and reason): based on clinical judgement         Upper Body Dressing : Moderate assistance;Sitting Upper Body Dressing Details (indicate cue type and reason): limited by impaired cognitive processing and acute confusion Lower Body Dressing: Moderate assistance;Sit to/from stand Lower Body Dressing Details (indicate cue type and reason): limited by impaired cognitive  processing and acute confusion                     Vision   Additional Comments: He stated he was able to see  the time depicted on the wall clock, however he was unable to state the time, which OT believes to be attributable to his acute AMS/cognitive processing deficits.            Pertinent Vitals/Pain Pain Assessment Pain Assessment: No/denies pain     Extremity/Trunk Assessment Upper Extremity Assessment Upper Extremity Assessment: Overall WFL for tasks assessed;Right hand dominant   Lower Extremity Assessment Lower Extremity Assessment: Generalized weakness     Communication Communication Communication: Difficulty following commands/understanding;Difficulty communicating thoughts/reduced clarity of speech Following commands:  (Required repetition of prompts and increased time to follow simple commands)   Cognition Arousal: Alert Behavior During Therapy: Flat affect Overall Cognitive Status: Impaired/Different from baseline Area of Impairment: Orientation, Memory, Following commands, Problem solving      Orientation Level: Disoriented to, Time, Situation   Memory: Decreased short-term memory, Decreased recall of precautions Following Commands: Follows one step commands inconsistently     Problem Solving: Slow processing                  Home Living Family/patient expects to be discharged to:: Private residence Living Arrangements: Alone Available Help at Discharge: Family;Friend(s);Other (Comment) (family calls him daily) Type of Home: House Home Access: Stairs to enter [employer] of Steps: 1   Home Layout: One level     Bathroom Shower/Tub: Chief Strategy Officer: Standard     Home Equipment: None   Additional Comments:  (His sister was present during the session and provided information regarding his prior level of functioning and living situation, given his AMS.)      Prior Functioning/Environment Prior Level of Function : Independent/Modified Independent             Mobility Comments:  (He was independent with  ambulation.) ADLs Comments: He was independent with ADLs, household chores, and driving.        OT Problem List: Impaired balance (sitting and/or standing);Decreased cognition;Decreased safety awareness;Decreased knowledge of use of DME or AE;Decreased strength      OT Treatment/Interventions: Self-care/ADL training;Therapeutic exercise;Energy conservation;DME and/or AE instruction;Therapeutic activities;Cognitive remediation/compensation;Patient/family education;Balance training    OT Goals(Current goals can be found in the care plan section) Acute Rehab OT Goals OT Goal Formulation: Patient unable to participate in goal setting Time For Goal Achievement: 06/22/23 Potential to Achieve Goals: Good ADL Goals Pt Will Perform Grooming: with supervision;standing Pt Will Perform Upper Body Dressing: with set-up;sitting Pt Will Perform Lower Body Dressing: with supervision;sit to/from stand Pt Will Transfer to Toilet: with supervision;ambulating Pt Will Perform Toileting - Clothing Manipulation and hygiene: with supervision;sit to/from stand  OT Frequency: Min 1X/week       AM-PAC OT "6 Clicks" Daily Activity     Outcome Measure Help from another person eating meals?: A Little Help from another person taking care of personal grooming?: A Little Help from another person toileting, which includes using toliet, bedpan, or urinal?: A Lot Help from another person bathing (including washing, rinsing, drying)?: A Lot Help from another person to put on and taking off regular upper body clothing?: A Lot   6 Click Score: 12   End of Session Equipment Utilized During Treatment: Rolling walker (2 wheels) Nurse Communication: Mobility status  Activity Tolerance: Other (comment) (Fair tolerance) Patient left: in bed;with  call bell/phone within reach;with bed alarm set;with family/visitor present  OT Visit Diagnosis: Other symptoms and signs involving cognitive function;Unsteadiness on feet  (R26.81)                Time: 4782-9562 OT Time Calculation (min): 17 min Charges:  OT General Charges $OT Visit: 1 Visit OT Evaluation $OT Eval Moderate Complexity: 1 Mod    Kaydi Kley L Traevon Meiring, OTR/L 06/09/2023, 4:17 PM

## 2023-06-09 NOTE — Plan of Care (Signed)
  Problem: Activity: Goal: Risk for activity intolerance will decrease Outcome: Progressing   Problem: Nutrition: Goal: Adequate nutrition will be maintained Outcome: Progressing   Problem: Skin Integrity: Goal: Risk for impaired skin integrity will decrease Outcome: Progressing   Problem: Safety: Goal: Ability to remain free from injury will improve Outcome: Not Progressing

## 2023-06-09 NOTE — Progress Notes (Signed)
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  Patient's ammonia which was around 50 has now increased to 77 today that is consistent with clinical picture where patient is slightly more lethargic today.  Per chart documentation, he has had only 1 bowel movements.  Currently he is on lactulose 30 g 3 times daily, will increase that to 4 times daily.  Continue rifaximin.  Hypokalemia: Replenished.  History of anxiety and depression: Not on any medications.  Class II obesity: Will need counseling once he is coherent.  DVT prophylaxis: heparin injection 5,000 Units Start: 06/08/23 0600   Code Status: Full Code  Family Communication: Sister present at bedside.  Plan of care discussed with patient in length and he/she verbalized understanding and agreed with it.  Status is: Inpatient Remains inpatient appropriate because: Encephalopathic   Estimated body mass index is 32.52 kg/m as calculated from the following:   Height as of this encounter: 6\' 3"  (1.905 m).   Weight as of this encounter: 118 kg.    Nutritional Assessment: Body mass index is 32.52 kg/m.Marland Kitchen Seen by dietician.  I agree with the assessment and plan as outlined below: Nutrition Status:        . Skin Assessment: I have examined the patient's  skin and I agree with the wound assessment as performed by the wound care RN as outlined below:    Consultants:  None  Procedures:  None  Antimicrobials:  Anti-infectives (From admission, onward)    Start     Dose/Rate Route Frequency Ordered Stop   06/09/23 1130  fluconazole (DIFLUCAN) tablet 200 mg  Status:  Discontinued        200 mg Oral Daily 06/09/23 1036 06/09/23 1040   06/08/23 1000  rifaximin (XIFAXAN) tablet 200 mg        200 mg Oral 3 times daily 06/08/23 8119           Subjective: Patient seen and examined, patient lethargic and unable to hold any conversation but appears comfortable.  Objective: Vitals:   06/09/23 0403 06/09/23 0548 06/09/23 0700 06/09/23 1300  BP: (!) 85/45 103/64    Pulse: 99 (!) 101  92  Resp:      Temp: 98.7 F (37.1 C)   98.2 F (36.8 C)  TempSrc:    Oral  SpO2: 99%   100%  Weight: 118 kg  118 kg   Height:   6\' 3"  (1.905 m)     Intake/Output Summary (Last 24 hours) at 06/09/2023 1318 Last data filed at 06/09/2023 0500 Gross per 24 hour  Intake 480 ml  Output --  Net 480 ml   Filed Weights   06/09/23 0403 06/09/23 0700  Weight: 118 kg 118 kg    Examination:  General exam: Appears calm and comfortable but lethargic Respiratory system: Clear to auscultation. Respiratory effort normal. Cardiovascular system: S1 & S2 heard, RRR.  No JVD, murmurs, rubs, gallops or clicks. No pedal edema. Gastrointestinal system: Abdomen is nondistended, soft and nontender. No organomegaly or masses felt. Normal bowel sounds heard. Central nervous system: Lethargic.  Not oriented.   Data Reviewed: I have personally reviewed following labs and imaging studies  CBC: Recent Labs  Lab 06/08/23 0012 06/08/23 0211 06/08/23 0646 06/09/23 0348  WBC 6.3 6.4 6.0 5.8  NEUTROABS  --   --   --  3.2  HGB 10.4* 8.9* 9.4* 8.5*  HCT 29.3* 25.2* 26.2* 25.5*  MCV 93.6 93.7 93.6 97.3  PLT 139* 123* 130* 120*   Basic Metabolic Panel: Recent Labs   Lab 06/08/23 0012 06/08/23 0211 06/08/23 0646 06/09/23 0348  NA 131*  --  130* 127*  K 3.3*  --  3.3* 3.2*  CL 95*  --  94* 95*  CO2 26  --  26 22  GLUCOSE 108*  --  95 91  BUN 14  --  14 13  CREATININE 1.26* 1.12 1.12 1.21  CALCIUM 8.9  --  8.6* 8.1*   GFR: Estimated Creatinine Clearance: 98.9 mL/min (by C-G formula based on SCr of 1.21 mg/dL). Liver Function Tests: Recent Labs  Lab 06/08/23 0012 06/08/23 0646 06/09/23 0348  AST 53* 50* 48*  ALT 35 31 28  ALKPHOS 136* 121 109  BILITOT 2.8* 2.9* 3.0*  PROT 6.6 5.8* 5.3*  ALBUMIN 3.1* 2.7* 2.5*   Recent Labs  Lab 06/08/23 0012  LIPASE 61*   Recent Labs  Lab 06/08/23 0012 06/09/23 0909  AMMONIA 57* 77*   Coagulation Profile: Recent Labs  Lab 06/08/23 0112 06/08/23 0646  INR 1.8* 1.8*   Cardiac Enzymes: No results for input(s): "CKTOTAL", "CKMB", "CKMBINDEX", "TROPONINI" in the last 168 hours. BNP (last 3 results) No results for input(s): "PROBNP" in the last 8760 hours. HbA1C: No results for input(s): "HGBA1C" in the last 72 hours. CBG: Recent Labs  Lab 06/08/23 0054  GLUCAP 99   Lipid Profile: No results for input(s): "CHOL", "HDL", "LDLCALC", "TRIG", "CHOLHDL", "LDLDIRECT" in the last 72 hours. Thyroid Function Tests: Recent Labs    06/08/23 0211  TSH 2.700   Anemia Panel: No results for input(s): "VITAMINB12", "FOLATE", "FERRITIN", "TIBC", "IRON", "RETICCTPCT" in the last 72 hours. Sepsis Labs: Recent Labs  Lab 06/08/23 0211  PROCALCITON <0.10    Recent Results (from the past 240 hours)  Respiratory (~20 pathogens) panel by PCR     Status: Abnormal   Collection Time: 06/08/23  2:11 AM   Specimen: Nasopharyngeal Swab; Respiratory  Result Value Ref Range Status   Adenovirus NOT DETECTED NOT DETECTED Final   Coronavirus 229E NOT DETECTED NOT DETECTED Final    Comment: (NOTE) The Coronavirus on the Respiratory Panel, DOES NOT test for the novel  Coronavirus (2019 nCoV)     Coronavirus HKU1 NOT DETECTED NOT DETECTED Final   Coronavirus NL63 NOT DETECTED NOT DETECTED Final   Coronavirus OC43 NOT DETECTED NOT DETECTED Final   Metapneumovirus NOT DETECTED NOT DETECTED Final   Rhinovirus / Enterovirus DETECTED (A) NOT DETECTED Final   Influenza A NOT DETECTED NOT DETECTED Final   Influenza B NOT DETECTED NOT DETECTED Final   Parainfluenza Virus 1 NOT DETECTED NOT DETECTED Final   Parainfluenza Virus 2 NOT DETECTED NOT DETECTED Final   Parainfluenza Virus 3 NOT DETECTED NOT DETECTED Final   Parainfluenza Virus 4 NOT DETECTED NOT DETECTED Final   Respiratory Syncytial Virus NOT DETECTED NOT DETECTED Final   Bordetella pertussis NOT DETECTED  NOT DETECTED Final   Bordetella Parapertussis NOT DETECTED NOT DETECTED Final   Chlamydophila pneumoniae NOT DETECTED NOT DETECTED Final   Mycoplasma pneumoniae NOT DETECTED NOT DETECTED Final    Comment: Performed at [facility], [address], [place], [place] [address]     Radiology Studies: MR BRAIN WO CONTRAST Result Date: 06/08/2023 CLINICAL DATA:  Mental status change with unknown cause. EXAM: MRI HEAD WITHOUT CONTRAST TECHNIQUE: Multiplanar, multiecho pulse sequences of the brain and surrounding structures were obtained without intravenous contrast. COMPARISON:  Yesterday FINDINGS: Brain: No acute infarction, hemorrhage, hydrocephalus, extra-axial collection or mass lesion. No white matter disease or atrophy. Vascular: Normal flow voids. Skull and upper cervical spine: Normal marrow signal. Sinuses/Orbits: Negative. Other: Intermittent motion artifact. IMPRESSION: Negative motion degraded brain MRI. Electronically Signed   By: Tiburcio Pea M.D.   On: 06/08/2023 05:30   DG CHEST PORT 1 VIEW Result Date: 06/08/2023 CLINICAL DATA:  Altered mental status EXAM: PORTABLE CHEST 1 VIEW COMPARISON:  05/19/2023 FINDINGS: Normal cardiomediastinal silhouette. Low lung volumes. No focal consolidation, pleural effusion, or  pneumothorax. No displaced rib fractures. IMPRESSION: No acute cardiopulmonary disease. Electronically Signed   By: Minerva Fester M.D.   On: 06/08/2023 01:43   CT HEAD WO CONTRAST ( ) Result Date: 06/08/2023 CLINICAL DATA:  Delirium EXAM: CT HEAD WITHOUT CONTRAST TECHNIQUE: Contiguous axial images were obtained from the base of the skull through the vertex without intravenous contrast. RADIATION DOSE REDUCTION: This exam was performed according to the departmental dose-optimization program which includes automated exposure control, adjustment of the mA and/or kV according to patient size and/or use of iterative reconstruction technique. COMPARISON:  CT brain 05/19/2023 FINDINGS: Brain: No evidence of acute infarction, hemorrhage, hydrocephalus, extra-axial collection or mass lesion/mass effect. Vascular: No hyperdense vessel or unexpected calcification. Skull: Normal. Negative for fracture or focal lesion. Sinuses/Orbits: No acute finding. Other: None IMPRESSION: Negative non contrasted CT appearance of the brain. Electronically Signed   By: Jasmine Pang M.D.   On: 06/08/2023 00:03    Scheduled Meds:  heparin  5,000 Units Subcutaneous Q8H   lactulose  30 g Oral QID   pantoprazole  40 mg Oral BID   potassium chloride  40 mEq Oral Q4H   rifaximin  200 mg Oral TID   spironolactone  200 mg Oral Daily   Continuous Infusions:   LOS: 1 day   Hughie Closs, MD [facility]  06/09/2023, 1:18 PM   *Please note that this is a verbal dictation therefore any spelling or grammatical errors are due to the "Dragon Medical One" system interpretation.  Please page via Amion and do not message via secure chat for urgent patient care matters. Secure chat can be used for non urgent patient care matters.  How to contact the [facility] Attending or Consulting provider 7A - 7P or covering provider during after hours 7P -7A, for this patient?  Check the care team in [facility] and look for a) attending/consulting  TRH provider listed and b) the [facility] team listed. Page or secure chat 7A-7P. Log into [email or web address] and use [facility]'s universal password to access. If you do not have the password, please contact the hospital operator. Locate the [facility] provider you are looking for under [facility] and page to a number that you can be directly reached. If you still have difficulty reaching the provider, please page the [facility] (Director on Call) for the Hospitalists listed on amion for assistance.

## 2023-06-09 NOTE — Evaluation (Signed)
Physical Therapy Evaluation Patient Details Name: Erik Howe MRN: [id number] DOB: July 11, 1970 Today's Date: 06/09/2023  History of Present Illness  52 yo male presents to therapy s/p hospital admission on 06/07/2023 due to AMS. Pt was fount to have acute encephalopathy attributed to elevated ammonia levels. Pt had recent hospitalization 11/29-12/4 due to acute hepatic encephalopathy. Pt PMH includes but is not limited to: anxiety, arthritis, NASH cirrhosis of liver s/p TIPS, depression, GERD, HTN, varicose veins, and multiple IR paracentesis.  Clinical Impression      Pt admitted with above diagnosis.  Pt currently with functional limitations due to the deficits listed below (see PT Problem List).  Pt in bed resting when PT arrived. Sister present and indicates pt has not slept for 3 days and remains confused and minimal verbal communication since she arrived early this am. Nurse indicated need for pt to take medications. Pt roused and agreeable to therapy and sitting EOB for medications. Pt required max A x 2 for supine to sit, mod A for initial sitting balance EOB with B UE and LE support and able to progress to CGA with B LE and 1 UE support cues for posture and attention. Pt is oriented to self and sister, pt required increased time and was intermittently able to follow one step commands. Pt ed provided on date, time location and rational with no apparent retention at this time. Pt required mod  A x 2 for sit to stand  from EOB to RW, mod A for standing balance and side stepping to the R toward [facility] [address] for repositioning. Pt required max A to return to bed. Pt left in bed, all needs in place and sister present. Pt is not at baseline compared reports and functional mobility with therapy services in November of this year. Patient will benefit from continued inpatient follow up therapy, <3 hours/day pending pt progress with safety and IND with mobility tasks and medically.  Pt will benefit from acute skilled  PT to increase their independence and safety with mobility to allow discharge.       If plan is discharge home, recommend the following: Assistance with cooking/housework;Two people to help with walking and/or transfers;A lot of help with bathing/dressing/bathroom;Direct supervision/assist for medications management;Direct supervision/assist for financial management;Assist for transportation;Help with stairs or ramp for entrance;Supervision due to cognitive status   Can travel by private vehicle        Equipment Recommendations Rolling walker (2 wheels)  Recommendations for Other Services       Functional Status Assessment Patient has had a recent decline in their functional status and demonstrates the ability to make significant improvements in function in a reasonable and predictable amount of time.     Precautions / Restrictions Precautions Precautions: None Precaution Comments: denies h/o falls Restrictions Weight Bearing Restrictions Per Provider Order: No      Mobility  Bed Mobility Overal bed mobility: Needs Assistance Bed Mobility: Supine to Sit, Sit to Supine     Supine to sit: HOB elevated, Max assist, +2 for physical assistance, +2 for safety/equipment Sit to supine: Max assist   General bed mobility comments: pt very lethargic, sister and nurse in pts room, nurse wanting to rouse and sit upright to provide medications pt became progressivly more alert and attentive thoughout eval, pt remained oriented to self and sister    Transfers Overall transfer level: Needs assistance Equipment used: Rolling walker (2 wheels) Transfers: Sit to/from Stand Sit to Stand: Independent, Mod assist, +2 physical assistance, +2  safety/equipment, From elevated surface           General transfer comment: max cues, increased time and max A for inital standing balance    Ambulation/Gait Ambulation/Gait assistance: Independent, Mod assist   Assistive device: Rolling walker (2  wheels)         General Gait Details: side step to the R/HOB with max cues, A for RW managment and mod A with nurse CGA  Stairs            Wheelchair Mobility     Tilt Bed    Modified Rankin (Stroke Patients Only)       Balance Overall balance assessment: Independent, Needs assistance Sitting-balance support: Feet supported, Bilateral upper extremity supported Sitting balance-Leahy Scale: Poor Sitting balance - Comments: pt initally required mod A for sitting balance with strong posterior and R lateral lean, cues and facilitation for trunk flexion, attention and B UE support pt able to progress to CGA with intermittent periods with B LE and 1 UE support and ongoing cues for 20 min EOB   Standing balance support: Bilateral upper extremity supported, During functional activity, Reliant on assistive device for balance Standing balance-Leahy Scale: Zero                               Pertinent Vitals/Pain Pain Assessment Pain Assessment: Faces Faces Pain Scale: Hurts a little bit Pain Location: HA Pain Descriptors / Indicators: Aching, Headache Pain Intervention(s): Limited activity within patient's tolerance, Monitored during session    Home Living Family/patient expects to be discharged to:: Private residence Living Arrangements: Alone Available Help at Discharge: Family;Friend(s);Other (Comment) (family calls him daily) Type of Home: House Home Access: Stairs to enter   [employer] of Steps: 1   Home Layout: One level Home Equipment: None Additional Comments: information compiled from prior admission and sister whom was present during PT evaluation. pt is unable to provide insight to PLOF at this time    Prior Function Prior Level of Function : Independent/Modified Independent             Mobility Comments: no AD and IND for all ADLs, self care tasks, driving and IADLs       Extremity/Trunk Assessment        Lower Extremity  Assessment Lower Extremity Assessment: Generalized weakness    Cervical / Trunk Assessment Cervical / Trunk Assessment: Other exceptions Cervical / Trunk Exceptions: ascites  Communication   Communication Communication: Difficulty following commands/understanding;Difficulty communicating thoughts/reduced clarity of speech  Cognition Arousal: Alert, Lethargic Behavior During Therapy: Flat affect Overall Cognitive Status: Impaired/Different from baseline Area of Impairment: Orientation, Attention, Memory, Following commands, Safety/judgement, Awareness                 Orientation Level: Disoriented to, Place, Time, Situation (pt required increased time and prompting to verbalize last name, unable to provide DOB, able to report first and last name of sister) Current Attention Level: Focused Memory: Decreased recall of precautions, Decreased short-term memory Following Commands: Follows one step commands inconsistently Safety/Judgement: Decreased awareness of safety, Decreased awareness of deficits              General Comments      Exercises     Assessment/Plan    PT Assessment Patient needs continued PT services  PT Problem List Decreased strength;Decreased activity tolerance;Decreased balance;Decreased mobility;Decreased coordination;Decreased cognition;Decreased knowledge of use of DME;Decreased safety awareness;Decreased knowledge of precautions;Cardiopulmonary status limiting activity  PT Treatment Interventions DME instruction;Gait training;Stair training;Therapeutic activities;Functional mobility training;Therapeutic exercise;Balance training;Cognitive remediation;Neuromuscular re-education;Patient/family education    PT Goals (Current goals can be found in the Care Plan section)  Acute Rehab PT Goals PT Goal Formulation: Patient unable to participate in goal setting    Frequency Min 1X/week     Co-evaluation               AM-PAC PT "6 Clicks"  Mobility  Outcome Measure Help needed turning from your back to your side while in a flat bed without using bedrails?: A Lot Help needed moving from lying on your back to sitting on the side of a flat bed without using bedrails?: A Lot Help needed moving to and from a bed to a chair (including a wheelchair)?: A Lot Help needed standing up from a chair using your arms (e.g., wheelchair or bedside chair)?: A Lot Help needed to walk in hospital room?: Total Help needed climbing 3-5 steps with a railing? : Total 6 Click Score: 10    End of Session Equipment Utilized During Treatment: Gait belt Activity Tolerance: Patient limited by fatigue Patient left: with call bell/phone within reach;with family/visitor present;in bed;with bed alarm set Nurse Communication: Mobility status PT Visit Diagnosis: Unsteadiness on feet (R26.81);Other abnormalities of gait and mobility (R26.89);Muscle weakness (generalized) (M62.81);Difficulty in walking, not elsewhere classified (R26.2)    Time: 0981-1914 PT Time Calculation (min) (ACUTE ONLY): 38 min   Charges:   PT Evaluation $PT Eval Moderate Complexity: 1 Mod PT Treatments $Therapeutic Activity: 8-22 mins $Neuromuscular Re-education: 8-22 mins PT General Charges $$ ACUTE PT VISIT: 1 Visit         Johnny Bridge, PT Acute Rehab   Jacqualyn Posey 06/09/2023, 1:44 PM

## 2023-06-09 NOTE — TOC Initial Note (Signed)
Transition of Care [facility]) - Initial/Assessment Note    Patient Details  Name: Erik Howe MRN: [id number] Date of Birth: [birth year]  [facility] CM/SW Contact:    Erik Rucks, RN Phone Number: 06/09/2023, 3:06 PM  Clinical Narrative:  Met with pt's sister (Erik Howe) on unit to introduce role of TOC/NCM and review for dc planning, PT recommendation for short term rehab. Erik Howe declining at this time, states at baseline pt is independent with self care and functional mobility with no assistive devices, declines PT recommendation for RW. Erik Howe states she feels once pt's medical status improves he will return to his baseline with no need for short term rehab. TOC will continue to follow.                   Expected Discharge Plan: Home/Self Care Barriers to Discharge: Continued Medical Work up   Patient Goals and CMS Choice Patient states their goals for this hospitalization and ongoing recovery are:: return home with support from sister          Expected Discharge Plan and Services       Living arrangements for the past 2 months: Single Family Home                                      Prior Living Arrangements/Services Living arrangements for the past 2 months: Single Family Home Lives with:: Self Patient language and need for interpreter reviewed:: Yes        Need for Family Participation in Patient Care: Yes (Comment) Care giver support system in place?: Yes (comment)   Criminal Activity/Legal Involvement Pertinent to Current Situation/Hospitalization: No - Comment as needed  Activities of Daily Living   ADL Screening (condition at time of admission) Independently performs ADLs?: Yes (appropriate for developmental age) Is the patient deaf or have difficulty hearing?: No Does the patient have difficulty seeing, even when wearing glasses/contacts?: No Does the patient have difficulty concentrating, remembering, or making decisions?: Yes  Permission  Sought/Granted                  Emotional Assessment Appearance:: Appears stated age Attitude/Demeanor/Rapport: Other (comment) (AMS)     Alcohol / Substance Use: Not Applicable Psych Involvement: No (comment)  Admission diagnosis:  Hepatic encephalopathy (HCC) [K76.82] Acute encephalopathy [G93.40] Encephalopathy, unspecified type [G93.40] Patient Active Problem List   Diagnosis Date Noted   Acute encephalopathy 06/08/2023   Anemia 05/23/2023   Hepatic encephalopathy (HCC) 05/19/2023   Gastroesophageal reflux disease with esophagitis without hemorrhage 08/25/2022   Multiple duodenal ulcers 08/25/2022   Heme positive stool 08/25/2022   Benign neoplasm of ascending colon 08/25/2022   Benign neoplasm of transverse colon 08/25/2022   Benign neoplasm of descending colon 08/25/2022   Benign neoplasm of sigmoid colon 08/25/2022   Cirrhosis of liver with ascites (HCC) 07/20/2022   Varicose veins of bilateral lower extremities with other complications 08/26/2013   Stasis dermatitis of both legs 07/22/2013   Cellulitis of leg, left 03/28/2013   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, [address] [place] [address] Phone: [phone] Fax: [phone]     Social Drivers of Health (SDOH) Social History: SDOH Screenings  Food Insecurity: Patient Declined (06/08/2023)  Housing: Low Risk  (10/28/2022)   Received from [facility]  Transportation Needs: Patient Unable To Answer (06/08/2023)  Utilities: Patient Unable To Answer (06/08/2023)  Financial Resource Strain: Not on File (10/07/2021)   Received from [place], [place]  Physical Activity: Not on File (10/07/2021)   Received from [place], [place]  Social Connections: Not on File (03/02/2023)   Received  from [facility]  Stress: Not on File (10/07/2021)   Received from [place], [place]  Tobacco Use: Medium Risk (06/08/2023)   SDOH Interventions:     Readmission Risk Interventions    06/09/2023    3:05 PM 05/23/2023    4:24 PM  Readmission Risk Prevention Plan  Post Dischage Appt Complete   Medication Screening Complete   Transportation Screening Complete Complete  PCP or Specialist Appt within 5-7 Days  Complete  Home Care Screening  Complete  Medication Review (RN CM)  Complete

## 2023-06-10 DIAGNOSIS — G9340 Encephalopathy, unspecified: Secondary | ICD-10-CM | POA: Diagnosis not present

## 2023-06-10 LAB — COMPREHENSIVE METABOLIC PANEL
ALT: 27 U/L (ref 0–44)
AST: 46 U/L — ABNORMAL HIGH (ref 15–41)
Albumin: 2.4 g/dL — ABNORMAL LOW (ref 3.5–5.0)
Alkaline Phosphatase: 112 U/L (ref 38–126)
Anion gap: 9 (ref 5–15)
BUN: 11 mg/dL (ref 6–20)
CO2: 23 mmol/L (ref 22–32)
Calcium: 8.2 mg/dL — ABNORMAL LOW (ref 8.9–10.3)
Chloride: 101 mmol/L (ref 98–111)
Creatinine, Ser: 1.06 mg/dL (ref 0.61–1.24)
GFR, Estimated: >60 mL/min (ref >60)
Glucose, Bld: 87 mg/dL (ref 70–99)
Potassium: 4 mmol/L (ref 3.5–5.1)
Sodium: 133 mmol/L — ABNORMAL LOW (ref 135–145)
Total Bilirubin: 3.4 mg/dL — ABNORMAL HIGH (ref <1.2)
Total Protein: 5.4 g/dL — ABNORMAL LOW (ref 6.5–8.1)

## 2023-06-10 LAB — AMMONIA: Ammonia: 51 umol/L — ABNORMAL HIGH (ref 9–35)

## 2023-06-10 LAB — LACTIC ACID, PLASMA
Lactic Acid, Venous: 4.2 mmol/L (ref 0.5–1.9)
Lactic Acid, Venous: 3.3 mmol/L (ref 0.5–1.9)

## 2023-06-10 MED ORDER — SODIUM CHLORIDE 0.9 % IV BOLUS
1000.0000 mL | Freq: Once | INTRAVENOUS | Status: AC
  Administered 2023-06-10: 1000 mL via INTRAVENOUS

## 2023-06-10 MED ORDER — RIFAXIMIN 550 MG PO TABS
550.0000 mg | ORAL_TABLET | Freq: Two times a day (BID) | ORAL | Status: DC
  Administered 2023-06-10 – 2023-06-11 (×3): 550 mg via ORAL
  Filled 2023-06-10 (×3): qty 1

## 2023-06-10 NOTE — Progress Notes (Signed)
Shift Summary: Pt had a critical lactic acid of 4.2, receiving 1 L of Normal Saline bolus. Lactic acid down to 3.3 on repeat. Pt has had multiple bowel movements related to lactulose. Mentation improving. Remains on room air with with rhinovirus, respiration even and unlabored. No distress noted. Will continue to monitor.

## 2023-06-10 NOTE — Progress Notes (Signed)
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  Patient is much more alert and fully oriented today.  Ammonia improved down to 50 from 77 yesterday.  Only 1 bowel movement charted however patient members that he has had 2 bowel movements.  Will continue current high dose of lactulose.  Reassess clinically tomorrow and recheck ammonia as well.    Hypokalemia: Replenished.  History of anxiety and depression: Not on any medications.  Class II obesity: Weight loss and dietary modification counseled.  Generalized weakness: Seen by PT OT yesterday, they recommended SNF.  Patient is now much more alert and oriented, I suspect that patient should do better with PT OT and they might update their recommendations.  DVT prophylaxis: heparin injection 5,000 Units Start: 06/08/23 0600   Code Status: Full Code  Family Communication: Sister present at bedside.  Plan of care discussed with patient in length and he/she verbalized understanding and agreed with it.  Status is: Inpatient Remains inpatient appropriate because: Encephalopathy improving.  Needs SNF, per PT.  TOC aware.   Estimated body mass index is 32.52 kg/m as calculated from the following:   Height as of this encounter: 6\' 3"  (1.905 m).   Weight as of this  encounter: 118 kg.    Nutritional Assessment: Body mass index is 32.52 kg/m.Marland Kitchen Seen by dietician.  I agree with the assessment and plan as outlined below: Nutrition Status:        . Skin Assessment: I have examined the patient's skin and I agree with the wound assessment as performed by the wound care RN as outlined below:    Consultants:  None  Procedures:  None  Antimicrobials:  Anti-infectives (From admission, onward)    Start     Dose/Rate Route Frequency Ordered Stop   06/10/23 1000  rifaximin (XIFAXAN) tablet 550 mg        550 mg Oral 2 times daily 06/10/23 0728     06/09/23 1130  fluconazole (DIFLUCAN) tablet 200 mg  Status:  Discontinued        200 mg Oral Daily 06/09/23 1036 06/09/23 1040   06/08/23 1000  rifaximin (XIFAXAN) tablet 200 mg  Status:  Discontinued        200 mg Oral 3 times daily 06/08/23 3086 06/10/23 5784         Subjective: Patient seen and examined with patient's primary RN present at the bedside.  Patient was fully alert and oriented.  He had no complaint.  Objective: Vitals:   06/09/23 1515 06/09/23 2022 06/10/23 0436 06/10/23 0844  BP: (!) 106/59 111/66 (!) 93/52 109/62  Pulse: 88 (!) 101 93 90  Resp:  18 14   Temp:  98.9 F (37.2 C) 98.3 F (36.8 C)   TempSrc:  Oral    SpO2:  96% 100%   Weight:      Height:  Intake/Output Summary (Last 24 hours) at 06/10/2023 0920 Last data filed at 06/10/2023 0811 Gross per 24 hour  Intake 120 ml  Output --  Net 120 ml   Filed Weights   06/09/23 0403 06/09/23 0700  Weight: 118 kg 118 kg    Examination:  General exam: Appears calm and comfortable, obese Respiratory system: Clear to auscultation. Respiratory effort normal. Cardiovascular system: S1 & S2 heard, RRR. No JVD, murmurs, rubs, gallops or clicks. No pedal edema. Gastrointestinal system: Abdomen is nondistended, soft and nontender. No organomegaly or masses felt. Normal bowel sounds heard. Central nervous system:  Alert and oriented. No focal neurological deficits. Extremities: Symmetric 5 x 5 power. Skin: No rashes, lesions or ulcers.    Data Reviewed: I have personally reviewed following labs and imaging studies  CBC: Recent Labs  Lab 06/08/23 0012 06/08/23 0211 06/08/23 0646 06/09/23 0348  WBC 6.3 6.4 6.0 5.8  NEUTROABS  --   --   --  3.2  HGB 10.4* 8.9* 9.4* 8.5*  HCT 29.3* 25.2* 26.2* 25.5*  MCV 93.6 93.7 93.6 97.3  PLT 139* 123* 130* 120*   Basic Metabolic Panel: Recent Labs  Lab 06/08/23 0012 06/08/23 0211 06/08/23 0646 06/09/23 0348 06/10/23 0416  NA 131*  --  130* 127* 133*  K 3.3*  --  3.3* 3.2* 4.0  CL 95*  --  94* 95* 101  CO2 26  --  26 22 23   GLUCOSE 108*  --  95 91 87  BUN 14  --  14 13 11   CREATININE 1.26* 1.12 1.12 1.21 1.06  CALCIUM 8.9  --  8.6* 8.1* 8.2*   GFR: Estimated Creatinine Clearance: 112.9 mL/min (by C-G formula based on SCr of 1.06 mg/dL). Liver Function Tests: Recent Labs  Lab 06/08/23 0012 06/08/23 0646 06/09/23 0348 06/10/23 0416  AST 53* 50* 48* 46*  ALT 35 31 28 27   ALKPHOS 136* 121 109 112  BILITOT 2.8* 2.9* 3.0* 3.4*  PROT 6.6 5.8* 5.3* 5.4*  ALBUMIN 3.1* 2.7* 2.5* 2.4*   Recent Labs  Lab 06/08/23 0012  LIPASE 61*   Recent Labs  Lab 06/08/23 0012 06/09/23 0909 06/10/23 0416  AMMONIA 57* 77* 51*   Coagulation Profile: Recent Labs  Lab 06/08/23 0112 06/08/23 0646  INR 1.8* 1.8*   Cardiac Enzymes: No results for input(s): "CKTOTAL", "CKMB", "CKMBINDEX", "TROPONINI" in the last 168 hours. BNP (last 3 results) No results for input(s): "PROBNP" in the last 8760 hours. HbA1C: No results for input(s): "HGBA1C" in the last 72 hours. CBG: Recent Labs  Lab 06/08/23 0054  GLUCAP 99   Lipid Profile: No results for input(s): "CHOL", "HDL", "LDLCALC", "TRIG", "CHOLHDL", "LDLDIRECT" in the last 72 hours. Thyroid Function Tests: Recent Labs    06/08/23 0211  TSH 2.700   Anemia Panel: No results for input(s):  "VITAMINB12", "FOLATE", "FERRITIN", "TIBC", "IRON", "RETICCTPCT" in the last 72 hours. Sepsis Labs: Recent Labs  Lab 06/08/23 0211 06/09/23 1435  PROCALCITON <0.10  --   LATICACIDVEN  --  2.8*    Recent Results (from the past 240 hours)  Respiratory (~20 pathogens) panel by PCR     Status: Abnormal   Collection Time: 06/08/23  2:11 AM   Specimen: Nasopharyngeal Swab; Respiratory  Result Value Ref Range Status   Adenovirus NOT DETECTED NOT DETECTED Final   Coronavirus 229E NOT DETECTED NOT DETECTED Final    Comment: (NOTE) The Coronavirus on the Respiratory Panel, DOES NOT test for the novel  Coronavirus (2019 nCoV)  Coronavirus HKU1 NOT DETECTED NOT DETECTED Final   Coronavirus NL63 NOT DETECTED NOT DETECTED Final   Coronavirus OC43 NOT DETECTED NOT DETECTED Final   Metapneumovirus NOT DETECTED NOT DETECTED Final   Rhinovirus / Enterovirus DETECTED (A) NOT DETECTED Final   Influenza A NOT DETECTED NOT DETECTED Final   Influenza B NOT DETECTED NOT DETECTED Final   Parainfluenza Virus 1 NOT DETECTED NOT DETECTED Final   Parainfluenza Virus 2 NOT DETECTED NOT DETECTED Final   Parainfluenza Virus 3 NOT DETECTED NOT DETECTED Final   Parainfluenza Virus 4 NOT DETECTED NOT DETECTED Final   Respiratory Syncytial Virus NOT DETECTED NOT DETECTED Final   Bordetella pertussis NOT DETECTED NOT DETECTED Final   Bordetella Parapertussis NOT DETECTED NOT DETECTED Final   Chlamydophila pneumoniae NOT DETECTED NOT DETECTED Final   Mycoplasma pneumoniae NOT DETECTED NOT DETECTED Final    Comment: Performed at [facility], [address] [place], [place] [address]     Radiology Studies: No results found.   Scheduled Meds:  heparin  5,000 Units Subcutaneous Q8H   lactulose  30 g Oral QID   midodrine  5 mg Oral TID WC   pantoprazole  40 mg Oral BID   rifaximin  550 mg Oral BID   spironolactone  200 mg Oral Daily   Continuous Infusions:   LOS: 2 days   Hughie Closs,  MD [facility]  06/10/2023, 9:20 AM   *Please note that this is a verbal dictation therefore any spelling or grammatical errors are due to the "[employer] One" system interpretation.  Please page via Amion and do not message via secure chat for urgent patient care matters. Secure chat can be used for non urgent patient care matters.  How to contact the [facility] Attending or Consulting provider 7A - 7P or covering provider during after hours 7P -7A, for this patient?  Check the care team in [facility] and look for a) attending/consulting [facility] provider listed and b) the [facility] team listed. Page or secure chat 7A-7P. Log into [email or web address] and use [employer]'s universal password to access. If you do not have the password, please contact the hospital operator. Locate the [facility] provider you are looking for under [facility] and page to a number that you can be directly reached. If you still have difficulty reaching the provider, please page the [facility] (Director on Call) for the Hospitalists listed on amion for assistance.

## 2023-06-10 NOTE — Plan of Care (Signed)

## 2023-06-11 ENCOUNTER — Telehealth (HOSPITAL_COMMUNITY): Payer: Self-pay | Admitting: Emergency Medicine

## 2023-06-11 DIAGNOSIS — G9340 Encephalopathy, unspecified: Secondary | ICD-10-CM | POA: Diagnosis not present

## 2023-06-11 LAB — COMPREHENSIVE METABOLIC PANEL
ALT: 30 U/L (ref 0–44)
AST: 45 U/L — ABNORMAL HIGH (ref 15–41)
Albumin: 2.6 g/dL — ABNORMAL LOW (ref 3.5–5.0)
Alkaline Phosphatase: 112 U/L (ref 38–126)
Anion gap: 7 (ref 5–15)
BUN: 10 mg/dL (ref 6–20)
CO2: 24 mmol/L (ref 22–32)
Calcium: 8.8 mg/dL — ABNORMAL LOW (ref 8.9–10.3)
Chloride: 100 mmol/L (ref 98–111)
Creatinine, Ser: 0.9 mg/dL (ref 0.61–1.24)
GFR, Estimated: >60 mL/min (ref >60)
Glucose, Bld: 100 mg/dL — ABNORMAL HIGH (ref 70–99)
Potassium: 4.2 mmol/L (ref 3.5–5.1)
Sodium: 131 mmol/L — ABNORMAL LOW (ref 135–145)
Total Bilirubin: 3.1 mg/dL — ABNORMAL HIGH (ref <1.2)
Total Protein: 5.8 g/dL — ABNORMAL LOW (ref 6.5–8.1)

## 2023-06-11 LAB — LACTIC ACID, PLASMA: Lactic Acid, Venous: 2.3 mmol/L (ref 0.5–1.9)

## 2023-06-11 LAB — AMMONIA: Ammonia: 26 umol/L (ref 9–35)

## 2023-06-11 MED ORDER — MIDODRINE HCL 5 MG PO TABS: 5.0000 mg | ORAL_TABLET | Freq: Three times a day (TID) | ORAL | 0 refills | Status: AC

## 2023-06-11 MED ORDER — MIDODRINE HCL 5 MG PO TABS
5.0000 mg | ORAL_TABLET | Freq: Three times a day (TID) | ORAL | 0 refills | Status: DC
  Filled 2023-06-11: qty 90, 30d supply, fill #0

## 2023-06-11 NOTE — Plan of Care (Signed)

## 2023-06-11 NOTE — Progress Notes (Signed)
Physical Therapy Evaluation Patient Details Name: Erik Howe MRN: [id number] DOB: 1970/08/23 Today's Date: 06/11/2023  History of Present Illness  52 yo male presents to therapy s/p hospital admission on 06/07/2023 due to AMS. Pt was fount to have acute encephalopathy attributed to elevated ammonia levels. Pt had recent hospitalization 11/29-12/4 due to acute hepatic encephalopathy. Pt PMH includes but is not limited to: anxiety, arthritis, NASH cirrhosis of liver s/p TIPS, depression, GERD, HTN, varicose veins, and multiple IR paracentesis.  Clinical Impression  Pt donned briefs and pants independently and ambulated good distance in hallway.  [name] cognition and mobility appear greatly improved since previous PT visit.  Updated d/c recommendations to no f/u PT or DME.  Pt eager to d/c home today.      If plan is discharge home, recommend the following:     Can travel by private vehicle        Equipment Recommendations None recommended by PT  Recommendations for Other Services       Functional Status Assessment       Precautions / Restrictions Precautions Precautions: None      Mobility  Bed Mobility Overal bed mobility: Modified Independent                  Transfers Overall transfer level: Needs assistance Equipment used: None Transfers: Sit to/from Stand Sit to Stand: Contact guard assist, Supervision                Ambulation/Gait Ambulation/Gait assistance: Contact guard assist, Supervision Gait Distance (Feet): 320 Feet Assistive device: None Gait Pattern/deviations: WFL(Within Functional Limits)       General Gait Details: no unsteadiness or LOB observed, pt with increased work of breathing however SPO2 100% pre and post ambulation on room air  Stairs            Wheelchair Mobility     Tilt Bed    Modified Rankin (Stroke Patients Only)       Balance Overall balance assessment: No apparent balance deficits (not formally  assessed)                                           Pertinent Vitals/Pain Pain Assessment Pain Assessment: No/denies pain    Home Living                          Prior Function                       Extremity/Trunk Assessment                Communication   Communication Communication: No apparent difficulties  Cognition Arousal: Alert Behavior During Therapy: Flat affect Overall Cognitive Status: Within Functional Limits for tasks assessed                                          General Comments      Exercises     Assessment/Plan    PT Assessment    PT Problem List         PT Treatment Interventions      PT Goals (Current goals can be found in the Care Plan section)       Frequency Min  1X/week     Co-evaluation               AM-PAC PT "6 Clicks" Mobility  Outcome Measure Help needed turning from your back to your side while in a flat bed without using bedrails?: None Help needed moving from lying on your back to sitting on the side of a flat bed without using bedrails?: None Help needed moving to and from a bed to a chair (including a wheelchair)?: None Help needed standing up from a chair using your arms (e.g., wheelchair or bedside chair)?: None Help needed to walk in hospital room?: A Little Help needed climbing 3-5 steps with a railing? : A Little 6 Click Score: 22    End of Session   Activity Tolerance: Patient tolerated treatment well Patient left: with call bell/phone within reach;in chair Nurse Communication: Mobility status PT Visit Diagnosis: Difficulty in walking, not elsewhere classified (R26.2)    Time: 2130-8657 PT Time Calculation (min) (ACUTE ONLY): 19 min   Charges:     PT Treatments $Gait Training: 8-22 mins PT General Charges $$ ACUTE PT VISIT: 1 Visit        Paulino Door, DPT Physical Therapist Acute Rehabilitation Services Office: [phone]   Janan Halter [place] 06/11/2023, 12:58 PM

## 2023-06-11 NOTE — Telephone Encounter (Signed)
Call to Fayrene Fearing to update him on pharmacy change to [facility] on [address] so he can get his medication today - no other questions at this time

## 2023-06-11 NOTE — Discharge Summary (Signed)
Physician Discharge Summary  Erik Howe ZDG:[id number] DOB: September 05, 1970 DOA: 06/07/2023  PCP: Erik Skye, PA-C  Admit date: 06/07/2023 Discharge date: 06/11/2023 30 Day Unplanned Readmission Risk Score    Flowsheet Row ED to Hosp-Admission (Current) from 06/07/2023 in [facility]  30 Day Unplanned Readmission Risk Score (%) 12.79 Filed at 06/11/2023 0801       This score is the patient's risk of an unplanned readmission within 30 days of being discharged (0 -100%). The score is based on dignosis, age, lab data, medications, orders, and past utilization.   Low:  0-14.9   Medium: 15-21.9   High: 22-29.9   Extreme: 30 and above          Admitted From: Home Disposition: Home  Recommendations for Outpatient Follow-up:  Follow up with PCP in 1-2 weeks Please obtain BMP/CBC in one week Please follow up with your PCP on the following pending results: Unresulted Labs (From admission, onward)     Start     Ordered   06/08/23 0122  Urinalysis, Routine w reflex microscopic -Urine, Clean Catch  Once,   URGENT       Question:  Specimen Source  Answer:  Urine, Clean Catch   06/08/23 0121   06/07/23 2316  Rapid urine drug screen (hospital performed)  ONCE - STAT,   STAT        06/07/23 2315              Home Health: None Equipment/Devices: None  Discharge Condition: Stable CODE STATUS: Full code Diet recommendation: Low-sodium  Subjective: Seen and examined.  Fully alert and oriented.  No complaints.  He is in agreement with going home today.  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  Details below.   History of  NASH cirrhosis s/p TIPS  with recurrent ascites /here with acute hepatic encephalopathy, POA: CT followed by MRI head negative for any acute pathology/stroke. He was resumed on his lactulose, patient's ammonia got worse and so did his encephalopathy.  Lactulose dose was increased.  He eventually had 6 bowel movements and his ammonia is back to normal today and he is fully alert and oriented.  While he was encephalopathy, he was assessed by PT OT and they recommended SNF.  They discussed with patient's sister Erik Howe, Erik Howe believed that patient does not need SNF and he is currently weak because he was encephalopathy, I totally agree with her.  Now that he is fully alert and oriented, I have requested PT to reassess him and I hope that they have will clear him to go home.  In anticipation of that and the fact that otherwise patient is medically stable, I am discharging this patient today pending PT evaluation.  Also, patient gets therapeutic paracentesis.  Equally, although currently he does have moderate to large paracentesis but he appears to be stable.  To facilitate patient and to prevent a visit for him, I have placed an order for paracentesis to be done by IR yesterday.  Patient's RN has been advised to reach out to the IR to see if they can do paracentesis before he leaves.  Discussed with Erik Howe/his sister over the phone about the plan of discharge and the fact that patient will be  discharged today even if IR cannot accommodate him for paracentesis and in that situation, he can have paracentesis done as an outpatient.   Hypokalemia: Resolved.   History of anxiety and depression: Not on any medications.   Class II obesity: Weight loss and dietary modification counseled.   Generalized weakness: Significant improvement.  PT to reassess today prior to discharge.  Discharge plan was discussed with patient and/or family member and they verbalized understanding and agreed with it.  Discharge Diagnoses:  Principal Problem:   Acute  encephalopathy Active Problems:   Hepatic encephalopathy [facility])    Discharge Instructions   Allergies as of 06/11/2023       Reactions   Tylenol [acetaminophen] Other (See Comments)   Told to avoid due to liver issues   Dust Mite Extract    Paroxetine Other (See Comments)   Stomach issues, "felt "wrong," and GI Intolerance   Pollen Extract    Tramadol Nausea Only        Medication List     STOP taking these medications    fluconazole 200 MG tablet Commonly known as: DIFLUCAN       TAKE these medications    albuterol 108 (90 Base) MCG/ACT inhaler Commonly known as: VENTOLIN HFA Inhale 2 puffs into the lungs every 6 (six) hours as needed for shortness of breath   amitriptyline 10 MG tablet Commonly known as: ELAVIL Take 1 tablet (10 mg total) by mouth at bedtime.   ammonium lactate 12 % lotion Commonly known as: LAC-HYDRIN Apply topically as needed for dry skin. Twice a day   Baclofen 5 MG Tabs Take 2 tablets (10 mg total) by mouth 3 (three) times daily.   bismuth subsalicylate 262 MG/15ML suspension Commonly known as: PEPTO BISMOL Take 30 mLs by mouth every 6 (six) hours as needed for indigestion.   clobetasol ointment 0.05 % Commonly known as: TEMOVATE Apply topically 2 (two) times a day. To affected area (right hand)   diphenhydrAMINE 50 MG tablet Commonly known as: BENADRYL Take 50 mg by mouth every 8 (eight) hours as needed for itching or allergies.   docusate sodium 100 MG capsule Commonly known as: COLACE Take 200 mg by mouth daily as needed (constipation.).   lactulose 10 GM/15ML solution Commonly known as: CHRONULAC Take 3.33 g by mouth 3 (three) times daily.   magnesium oxide 400 MG tablet Commonly known as: MAG-OX Take 1 tablet (400 mg total) by mouth daily.   midodrine 5 MG tablet Commonly known as: PROAMATINE Take 1 tablet (5 mg total) by mouth 3 (three) times daily with meals.   ondansetron 8 MG disintegrating tablet Commonly  known as: ZOFRAN-ODT Take 1 tablet (8 mg total) by mouth 3 (three) times daily as needed for nausea.   pantoprazole 40 MG tablet Commonly known as: PROTONIX Take 1 tablet (40 mg total) by mouth 2 (two) times daily.   potassium chloride SA 20 MEQ tablet Commonly known as: KLOR-CON M Take 2 tablets (40 mEq total) by mouth daily.   spironolactone 100 MG tablet Commonly known as: ALDACTONE Take 2 tablets (200 mg total) by mouth daily.   SSD 1 % cream Generic drug: silver sulfADIAZINE Apply topically 2 (two) times a day. To affected areas   sucralfate 1 g tablet Commonly known as: Carafate Take 1 tablet (1 g total) by mouth 4 (four) times daily -  with meals and at bedtime. Please dissolve the tablet in a tablespoon of water and drink the liquid 4 times a  day   torsemide 20 MG tablet Commonly known as: DEMADEX Take 2 tablets (40 mg total) by mouth daily.        Follow-up Information     Erik Skye, PA-C Follow up in 1 week(s).   Specialty: Physician Assistant Contact information: [address] [place] [address] [phone]                Allergies  Allergen Reactions   Tylenol [Acetaminophen] Other (See Comments)    Told to avoid due to liver issues   Dust Mite Extract    Paroxetine Other (See Comments)    Stomach issues, "felt "wrong," and GI Intolerance   Pollen Extract    Tramadol Nausea Only    Consultations: None   Procedures/Studies: MR BRAIN WO CONTRAST Result Date: 06/08/2023 CLINICAL DATA:  Mental status change with unknown cause. EXAM: MRI HEAD WITHOUT CONTRAST TECHNIQUE: Multiplanar, multiecho pulse sequences of the brain and surrounding structures were obtained without intravenous contrast. COMPARISON:  Yesterday FINDINGS: Brain: No acute infarction, hemorrhage, hydrocephalus, extra-axial collection or mass lesion. No white matter disease or atrophy. Vascular: Normal flow voids. Skull and upper cervical spine: Normal marrow signal.  Sinuses/Orbits: Negative. Other: Intermittent motion artifact. IMPRESSION: Negative motion degraded brain MRI. Electronically Signed   By: Tiburcio Pea M.D.   On: 06/08/2023 05:30   DG CHEST PORT 1 VIEW Result Date: 06/08/2023 CLINICAL DATA:  Altered mental status EXAM: PORTABLE CHEST 1 VIEW COMPARISON:  05/19/2023 FINDINGS: Normal cardiomediastinal silhouette. Low lung volumes. No focal consolidation, pleural effusion, or pneumothorax. No displaced rib fractures. IMPRESSION: No acute cardiopulmonary disease. Electronically Signed   By: Minerva Fester M.D.   On: 06/08/2023 01:43   CT HEAD WO CONTRAST ( ) Result Date: 06/08/2023 CLINICAL DATA:  Delirium EXAM: CT HEAD WITHOUT CONTRAST TECHNIQUE: Contiguous axial images were obtained from the base of the skull through the vertex without intravenous contrast. RADIATION DOSE REDUCTION: This exam was performed according to the departmental dose-optimization program which includes automated exposure control, adjustment of the mA and/or kV according to patient size and/or use of iterative reconstruction technique. COMPARISON:  CT brain 05/19/2023 FINDINGS: Brain: No evidence of acute infarction, hemorrhage, hydrocephalus, extra-axial collection or mass lesion/mass effect. Vascular: No hyperdense vessel or unexpected calcification. Skull: Normal. Negative for fracture or focal lesion. Sinuses/Orbits: No acute finding. Other: None IMPRESSION: Negative non contrasted CT appearance of the brain. Electronically Signed   By: Jasmine Pang M.D.   On: 06/08/2023 00:03   US Paracentesis Result Date: 05/21/2023 INDICATION: Cirrhosis.  Recurrent ascites EXAM: ULTRASOUND GUIDED PARACENTESIS MEDICATIONS: 10 cc 1% lidocaine. COMPLICATIONS: None immediate. PROCEDURE: Informed written consent was obtained from the patient after a discussion of the risks, benefits and alternatives to treatment. A timeout was performed prior to the initiation of the procedure. Initial  ultrasound scanning demonstrates a large amount of ascites within the right lower abdominal quadrant. The right lower abdomen was prepped and draped in the usual sterile fashion. 1% lidocaine was used for local anesthesia. Following this, a 7 cm Yueh catheter was introduced. An ultrasound image was saved for documentation purposes. The paracentesis was performed. The catheter was removed and a dressing was applied. The patient tolerated the procedure well without immediate post procedural complication. Patient received post-procedure intravenous albumin; see nursing notes for details. FINDINGS: A total of approximately 4.1 Liters of serous fluid was removed. IMPRESSION: Successful ultrasound-guided paracentesis yielding 4.1 Liters liters of peritoneal fluid. Performed by Robet Leu [facility] Electronically Signed  By: Simonne Come M.D.   On: 05/21/2023 10:01   CT ABDOMEN PELVIS W CONTRAST Result Date: 05/19/2023 CLINICAL DATA:  Right upper quadrant pain.  History of cirrhosis. EXAM: CT ABDOMEN AND PELVIS WITH CONTRAST TECHNIQUE: Multidetector CT imaging of the abdomen and pelvis was performed using the standard protocol following bolus administration of intravenous contrast. RADIATION DOSE REDUCTION: This exam was performed according to the departmental dose-optimization program which includes automated exposure control, adjustment of the mA and/or kV according to patient size and/or use of iterative reconstruction technique. CONTRAST:  OMNIPAQUE IOHEXOL 300 MG/ML  SOLN COMPARISON:  03/10/2022 FINDINGS: Lower chest: No acute abnormality. Hepatobiliary: Shrunken, nodular appearance of the liver compatible with cirrhosis. No focal liver lesion identified. TIPS in place. Prior cholecystectomy. No biliary dilatation. Pancreas: Unremarkable. No pancreatic ductal dilatation or surrounding inflammatory changes. Spleen: Mildly enlarged without focal abnormality. Adrenals/Urinary Tract: Adrenal glands are  unremarkable. Kidneys are normal, without renal calculi, solid lesion, or hydronephrosis. Bladder is unremarkable. Stomach/Bowel: Small hiatal hernia. Stomach otherwise unremarkable. No evidence of bowel obstruction. Long segment mild colonic wall thickening of the ascending colon. The appendix is normal in appearance (series 2, image 46). Vascular/Lymphatic: Scattered aortic atherosclerosis. Mild upper abdominal varices. No lymphadenopathy. Reproductive: Prostate is unremarkable. Other: Moderate volume ascites.  No pneumoperitoneum. Musculoskeletal: Anasarca. Gynecomastia. No acute bony abnormality. IMPRESSION: 1. Cirrhosis with sequela of portal hypertension including moderate volume ascites, mild splenomegaly, and mild upper abdominal varices. TIPS in place. 2. Long segment mild colonic wall thickening of the ascending colon, which may reflect portal colopathy or a mild colitis. 3. Small hiatal hernia. 4. Aortic atherosclerosis (ICD10-I70.0). Electronically Signed   By: Duanne Guess D.O.   On: 05/19/2023 19:03   DG Chest Port 1 View Result Date: 05/19/2023 CLINICAL DATA:  Altered mental status EXAM: PORTABLE CHEST 1 VIEW COMPARISON:  02/11/2018 FINDINGS: The heart size and mediastinal contours are within normal limits. Both lungs are clear. The visualized skeletal structures are unremarkable. IMPRESSION: No active disease. Electronically Signed   By: Duanne Guess D.O.   On: 05/19/2023 16:27   CT Head Wo Contrast Result Date: 05/19/2023 CLINICAL DATA:  New onset confusion.  Known attic cirrhosis. EXAM: CT HEAD WITHOUT CONTRAST TECHNIQUE: Contiguous axial images were obtained from the base of the skull through the vertex without intravenous contrast. RADIATION DOSE REDUCTION: This exam was performed according to the departmental dose-optimization program which includes automated exposure control, adjustment of the mA and/or kV according to patient size and/or use of iterative reconstruction  technique. COMPARISON:  None Available. FINDINGS: Brain: No evidence of acute infarction, hemorrhage, hydrocephalus, extra-axial collection or mass lesion/mass effect. No significant white matter lesions are present. Deep brain nuclei are within normal limits. The ventricles are of normal size. No significant extraaxial fluid collection is present. The brainstem and cerebellum are within normal limits. Midline structures are within normal limits. Vascular: No hyperdense vessel or unexpected calcification. Skull: Calvarium is intact. No focal lytic or blastic lesions are present. No significant extracranial soft tissue lesion is present. Sinuses/Orbits: The paranasal sinuses and mastoid air cells are clear. The globes and orbits are within normal limits. IMPRESSION: Normal CT appearance of the brain. Electronically Signed   By: Marin Roberts M.D.   On: 05/19/2023 16:25   IR Paracentesis Result Date: 05/16/2023 INDICATION: 52 year old male with cirrhosis, recurrent ascites. EXAM: ULTRASOUND GUIDED THERAPEUTIC PARACENTESIS MEDICATIONS: 10 mL 1% lidocaine COMPLICATIONS: None immediate. PROCEDURE: Informed written consent was obtained from the patient after a  discussion of the risks, benefits and alternatives to treatment. A timeout was performed prior to the initiation of the procedure. Initial ultrasound scanning demonstrates a large amount of ascites within the right lower abdominal quadrant. The right lower abdomen was prepped and draped in the usual sterile fashion. 1% lidocaine was used for local anesthesia. Following this, a 19 gauge, 10-cm, Yueh catheter was introduced. An ultrasound image was saved for documentation purposes. The paracentesis was performed. The catheter was removed and a dressing was applied. The patient tolerated the procedure well without immediate post procedural complication. Patient received post-procedure intravenous albumin; see nursing notes for details. FINDINGS: A total of  approximately 8.0 liters of yellow fluid was removed. IMPRESSION: Successful ultrasound-guided paracentesis yielding 8.0 liters of peritoneal fluid. Performed by: Loyce Dys PA-C Electronically Signed   By: Marliss Coots M.D.   On: 05/16/2023 11:36     Discharge Exam: Vitals:   06/11/23 0439 06/11/23 0825  BP: (!) 117/59 122/76  Pulse: 90 93  Resp: 14   Temp: 98.3 F (36.8 C)   SpO2: 100%    Vitals:   06/10/23 1950 06/11/23 0439 06/11/23 0500 06/11/23 0825  BP: (!) 111/57 (!) 117/59  122/76  Pulse: 98 90  93  Resp: 16 14    Temp: 98.7 F (37.1 C) 98.3 F (36.8 C)    TempSrc:      SpO2: 100% 100%    Weight:   118 kg   Height:        General: Pt is alert, awake, not in acute distress Cardiovascular: RRR, S1/S2 +, no rubs, no gallops Respiratory: CTA bilaterally, no wheezing, no rhonchi Abdominal: Soft, NT, moderate ascites, bowel sounds + Extremities: no edema, no cyanosis    The results of significant diagnostics from this hospitalization (including imaging, microbiology, ancillary and laboratory) are listed below for reference.     Microbiology: Recent Results (from the past 240 hours)  Respiratory (~20 pathogens) panel by PCR     Status: Abnormal   Collection Time: 06/08/23  2:11 AM   Specimen: Nasopharyngeal Swab; Respiratory  Result Value Ref Range Status   Adenovirus NOT DETECTED NOT DETECTED Final   Coronavirus 229E NOT DETECTED NOT DETECTED Final    Comment: (NOTE) The Coronavirus on the Respiratory Panel, DOES NOT test for the novel  Coronavirus (2019 nCoV)    Coronavirus HKU1 NOT DETECTED NOT DETECTED Final   Coronavirus NL63 NOT DETECTED NOT DETECTED Final   Coronavirus OC43 NOT DETECTED NOT DETECTED Final   Metapneumovirus NOT DETECTED NOT DETECTED Final   Rhinovirus / Enterovirus DETECTED (A) NOT DETECTED Final   Influenza A NOT DETECTED NOT DETECTED Final   Influenza B NOT DETECTED NOT DETECTED Final   Parainfluenza Virus 1 NOT DETECTED NOT  DETECTED Final   Parainfluenza Virus 2 NOT DETECTED NOT DETECTED Final   Parainfluenza Virus 3 NOT DETECTED NOT DETECTED Final   Parainfluenza Virus 4 NOT DETECTED NOT DETECTED Final   Respiratory Syncytial Virus NOT DETECTED NOT DETECTED Final   Bordetella pertussis NOT DETECTED NOT DETECTED Final   Bordetella Parapertussis NOT DETECTED NOT DETECTED Final   Chlamydophila pneumoniae NOT DETECTED NOT DETECTED Final   Mycoplasma pneumoniae NOT DETECTED NOT DETECTED Final    Comment: Performed at [facility], [address], [place], [place] [address]     Labs: BNP (last 3 results) No results for input(s): "BNP" in the last 8760 hours. Basic Metabolic Panel: Recent Labs  Lab 06/08/23 0012 06/08/23 0211 06/08/23  1610 06/09/23 0348 06/10/23 0416 06/11/23 0432  NA 131*  --  130* 127* 133* 131*  K 3.3*  --  3.3* 3.2* 4.0 4.2  CL 95*  --  94* 95* 101 100  CO2 26  --  26 22 23 24   GLUCOSE 108*  --  95 91 87 100*  BUN 14  --  14 13 11 10   CREATININE 1.26* 1.12 1.12 1.21 1.06 0.90  CALCIUM 8.9  --  8.6* 8.1* 8.2* 8.8*   Liver Function Tests: Recent Labs  Lab 06/08/23 0012 06/08/23 0646 06/09/23 0348 06/10/23 0416 06/11/23 0432  AST 53* 50* 48* 46* 45*  ALT 35 31 28 27 30   ALKPHOS 136* 121 109 112 112  BILITOT 2.8* 2.9* 3.0* 3.4* 3.1*  PROT 6.6 5.8* 5.3* 5.4* 5.8*  ALBUMIN 3.1* 2.7* 2.5* 2.4* 2.6*   Recent Labs  Lab 06/08/23 0012  LIPASE 61*   Recent Labs  Lab 06/08/23 0012 06/09/23 0909 06/10/23 0416 06/11/23 0432  AMMONIA 57* 77* 51* 26   CBC: Recent Labs  Lab 06/08/23 0012 06/08/23 0211 06/08/23 0646 06/09/23 0348  WBC 6.3 6.4 6.0 5.8  NEUTROABS  --   --   --  3.2  HGB 10.4* 8.9* 9.4* 8.5*  HCT 29.3* 25.2* 26.2* 25.5*  MCV 93.6 93.7 93.6 97.3  PLT 139* 123* 130* 120*   Cardiac Enzymes: No results for input(s): "CKTOTAL", "CKMB", "CKMBINDEX", "TROPONINI" in the last 168 hours. BNP: Invalid input(s): "POCBNP" CBG: Recent Labs  Lab  06/08/23 0054  GLUCAP 99   D-Dimer No results for input(s): "DDIMER" in the last 72 hours. Hgb A1c No results for input(s): "HGBA1C" in the last 72 hours. Lipid Profile No results for input(s): "CHOL", "HDL", "LDLCALC", "TRIG", "CHOLHDL", "LDLDIRECT" in the last 72 hours. Thyroid function studies No results for input(s): "TSH", "T4TOTAL", "T3FREE", "THYROIDAB" in the last 72 hours.  Invalid input(s): "FREET3" Anemia work up No results for input(s): "VITAMINB12", "FOLATE", "FERRITIN", "TIBC", "IRON", "RETICCTPCT" in the last 72 hours. Urinalysis    Component Value Date/Time   COLORURINE AMBER (A) 05/19/2023 1524   APPEARANCEUR CLEAR 05/19/2023 1524   LABSPEC 1.018 05/19/2023 1524   PHURINE 5.0 05/19/2023 1524   GLUCOSEU NEGATIVE 05/19/2023 1524   HGBUR MODERATE (A) 05/19/2023 1524   BILIRUBINUR NEGATIVE 05/19/2023 1524   KETONESUR NEGATIVE 05/19/2023 1524   PROTEINUR NEGATIVE 05/19/2023 1524   NITRITE NEGATIVE 05/19/2023 1524   LEUKOCYTESUR NEGATIVE 05/19/2023 1524   Sepsis Labs Recent Labs  Lab 06/08/23 0012 06/08/23 0211 06/08/23 0646 06/09/23 0348  WBC 6.3 6.4 6.0 5.8   Microbiology Recent Results (from the past 240 hours)  Respiratory (~20 pathogens) panel by PCR     Status: Abnormal   Collection Time: 06/08/23  2:11 AM   Specimen: Nasopharyngeal Swab; Respiratory  Result Value Ref Range Status   Adenovirus NOT DETECTED NOT DETECTED Final   Coronavirus 229E NOT DETECTED NOT DETECTED Final    Comment: (NOTE) The Coronavirus on the Respiratory Panel, DOES NOT test for the novel  Coronavirus (2019 nCoV)    Coronavirus HKU1 NOT DETECTED NOT DETECTED Final   Coronavirus NL63 NOT DETECTED NOT DETECTED Final   Coronavirus OC43 NOT DETECTED NOT DETECTED Final   Metapneumovirus NOT DETECTED NOT DETECTED Final   Rhinovirus / Enterovirus DETECTED (A) NOT DETECTED Final   Influenza A NOT DETECTED NOT DETECTED Final   Influenza B NOT DETECTED NOT DETECTED Final    Parainfluenza Virus 1 NOT DETECTED NOT DETECTED Final  Parainfluenza Virus 2 NOT DETECTED NOT DETECTED Final   Parainfluenza Virus 3 NOT DETECTED NOT DETECTED Final   Parainfluenza Virus 4 NOT DETECTED NOT DETECTED Final   Respiratory Syncytial Virus NOT DETECTED NOT DETECTED Final   Bordetella pertussis NOT DETECTED NOT DETECTED Final   Bordetella Parapertussis NOT DETECTED NOT DETECTED Final   Chlamydophila pneumoniae NOT DETECTED NOT DETECTED Final   Mycoplasma pneumoniae NOT DETECTED NOT DETECTED Final    Comment: Performed at [facility], [address] [place], [place] [address]    FURTHER DISCHARGE INSTRUCTIONS:   Get Medicines reviewed and adjusted: Please take all your medications with you for your next visit with your Primary MD   Laboratory/radiological data: Please request your Primary MD to go over all hospital tests and procedure/radiological results at the follow up, please ask your Primary MD to get all Hospital records sent to his/her office.   In some cases, they will be blood work, cultures and biopsy results pending at the time of your discharge. Please request that your primary care M.D. goes through all the records of your hospital data and follows up on these results.   Also Note the following: If you experience worsening of your admission symptoms, develop shortness of breath, life threatening emergency, suicidal or homicidal thoughts you must seek medical attention immediately by calling 911 or calling your MD immediately  if symptoms less severe.   You must read complete instructions/literature along with all the possible adverse reactions/side effects for all the Medicines you take and that have been prescribed to you. Take any new Medicines after you have completely understood and accpet all the possible adverse reactions/side effects.    Do not drive when taking Pain medications or sleeping medications (Benzodaizepines)   Do not take more than  prescribed Pain, Sleep and Anxiety Medications. It is not advisable to combine anxiety,sleep and pain medications without talking with your primary care practitioner   Special Instructions: If you have smoked or chewed Tobacco  in the last 2 yrs please stop smoking, stop any regular Alcohol  and or any Recreational drug use.   Wear Seat belts while driving.   Please note: You were cared for by a hospitalist during your hospital stay. Once you are discharged, your primary care physician will handle any further medical issues. Please note that NO REFILLS for any discharge medications will be authorized once you are discharged, as it is imperative that you return to your primary care physician (or establish a relationship with a primary care physician if you do not have one) for your post hospital discharge needs so that they can reassess your need for medications and monitor your lab values  Time coordinating discharge: Over 30 minutes  SIGNED:   Hughie Closs, MD  [facility] 06/11/2023, 11:38 AM *Please note that this is a verbal dictation therefore any spelling or grammatical errors are due to the "[employer]agon [employer] One" system interpretation. If 7PM-7AM, please contact night-coverage [email or web address]

## 2023-06-11 NOTE — Progress Notes (Signed)
AVS reviewed w/ patient who verbalized an understanding. No other questions. [name] pharmacy closed today, Request sent to Dr Jacqulyn Bath to send d/c med to [facility]  on [address]. PIV removed x 2 as noted. Pt dressed for d/c to home. Central tele called by this RN to remove pt from tele since he has been d/c to home. Pt to lobby via w/c - home w/ friend.

## 2023-06-12 ENCOUNTER — Other Ambulatory Visit: Payer: Self-pay

## 2023-06-12 ENCOUNTER — Other Ambulatory Visit (HOSPITAL_COMMUNITY): Payer: Self-pay

## 2023-06-19 ENCOUNTER — Ambulatory Visit (HOSPITAL_COMMUNITY)
Admission: RE | Admit: 2023-06-19 | Discharge: 2023-06-19 | Disposition: A | Discharge status: Home / Self Care | Payer: BLUE CROSS/BLUE SHIELD | Source: Ambulatory Visit | Attending: Nurse Practitioner | Admitting: Nurse Practitioner

## 2023-06-19 DIAGNOSIS — R188 Other ascites: Secondary | ICD-10-CM | POA: Insufficient documentation

## 2023-06-19 HISTORY — PX: IR PARACENTESIS: IMG2679

## 2023-06-19 MED ORDER — ALBUMIN HUMAN 25 % IV SOLN
50.0000 g | Freq: Once | INTRAVENOUS | Status: AC
  Administered 2023-06-19: 50 g via INTRAVENOUS

## 2023-06-19 MED ORDER — LIDOCAINE HCL 1 % IJ SOLN
20.0000 mL | Freq: Once | INTRAMUSCULAR | Status: AC
  Administered 2023-06-19: 10 mL

## 2023-06-19 MED ORDER — ALBUMIN HUMAN 25 % IV SOLN
INTRAVENOUS | Status: AC
Start: 2023-06-19 — End: ?
  Filled 2023-06-19: qty 200

## 2023-06-19 MED ORDER — LIDOCAINE HCL 1 % IJ SOLN
INTRAMUSCULAR | Status: AC
Start: 2023-06-19 — End: ?
  Filled 2023-06-19: qty 20

## 2023-06-19 NOTE — Procedures (Signed)
PROCEDURE SUMMARY:  Successful US guided paracentesis from left lateral abdomen.  Yielded 7.1 of yellow fluid.  No immediate complications.  Pt tolerated well.   Specimen was not sent for labs.  EBL < 5mL  Hoyt Koch PA-C 06/19/2023 2:52 PM

## 2023-06-27 ENCOUNTER — Encounter: Payer: Self-pay | Admitting: Gastroenterology

## 2023-06-28 NOTE — Telephone Encounter (Signed)
 Pt calling, he wants to know If Dr. Milana Kidney wants him to be taking omeprazole or protonix. Whichever one he is supposed to be taking he needs refills, please advise. When he was seen in the office last he was taking omeprazole 20mg  bid.

## 2023-06-29 MED ORDER — PANTOPRAZOLE SODIUM 40 MG PO TBEC: 40.0000 mg | DELAYED_RELEASE_TABLET | Freq: Two times a day (BID) | ORAL | 3 refills | Status: DC

## 2023-07-03 ENCOUNTER — Encounter (HOSPITAL_COMMUNITY): Payer: Self-pay | Admitting: Emergency Medicine

## 2023-07-03 ENCOUNTER — Inpatient Hospital Stay (HOSPITAL_COMMUNITY)
Admission: EM | Admit: 2023-07-03 | Discharge: 2023-07-10 | DRG: 432 | Disposition: A | Discharge status: Other Acute Inpatient Hospital | Payer: BLUE CROSS/BLUE SHIELD | Attending: Internal Medicine | Admitting: Internal Medicine

## 2023-07-03 ENCOUNTER — Observation Stay (HOSPITAL_COMMUNITY): Payer: BLUE CROSS/BLUE SHIELD

## 2023-07-03 ENCOUNTER — Emergency Department (HOSPITAL_COMMUNITY): Payer: BLUE CROSS/BLUE SHIELD

## 2023-07-03 ENCOUNTER — Other Ambulatory Visit: Payer: Self-pay

## 2023-07-03 DIAGNOSIS — E871 Hypo-osmolality and hyponatremia: Secondary | ICD-10-CM

## 2023-07-03 DIAGNOSIS — K7031 Alcoholic cirrhosis of liver with ascites: Secondary | ICD-10-CM | POA: Diagnosis not present

## 2023-07-03 DIAGNOSIS — G4700 Insomnia, unspecified: Secondary | ICD-10-CM | POA: Diagnosis present

## 2023-07-03 DIAGNOSIS — I110 Hypertensive heart disease with heart failure: Secondary | ICD-10-CM | POA: Diagnosis present

## 2023-07-03 DIAGNOSIS — K7290 Hepatic failure, unspecified without coma: Principal | ICD-10-CM | POA: Diagnosis present

## 2023-07-03 DIAGNOSIS — Z79899 Other long term (current) drug therapy: Secondary | ICD-10-CM

## 2023-07-03 DIAGNOSIS — K7460 Unspecified cirrhosis of liver: Secondary | ICD-10-CM

## 2023-07-03 DIAGNOSIS — T82898A Other specified complication of vascular prosthetic devices, implants and grafts, initial encounter: Secondary | ICD-10-CM | POA: Diagnosis present

## 2023-07-03 DIAGNOSIS — Z87891 Personal history of nicotine dependence: Secondary | ICD-10-CM

## 2023-07-03 DIAGNOSIS — I50810 Right heart failure, unspecified: Secondary | ICD-10-CM

## 2023-07-03 DIAGNOSIS — K767 Hepatorenal syndrome: Secondary | ICD-10-CM | POA: Diagnosis present

## 2023-07-03 DIAGNOSIS — I8510 Secondary esophageal varices without bleeding: Secondary | ICD-10-CM | POA: Diagnosis present

## 2023-07-03 DIAGNOSIS — K766 Portal hypertension: Secondary | ICD-10-CM | POA: Diagnosis present

## 2023-07-03 DIAGNOSIS — R0602 Shortness of breath: Secondary | ICD-10-CM | POA: Diagnosis not present

## 2023-07-03 DIAGNOSIS — K2100 Gastro-esophageal reflux disease with esophagitis, without bleeding: Secondary | ICD-10-CM | POA: Diagnosis present

## 2023-07-03 DIAGNOSIS — D638 Anemia in other chronic diseases classified elsewhere: Secondary | ICD-10-CM | POA: Diagnosis present

## 2023-07-03 DIAGNOSIS — Z8711 Personal history of peptic ulcer disease: Secondary | ICD-10-CM

## 2023-07-03 DIAGNOSIS — R339 Retention of urine, unspecified: Secondary | ICD-10-CM | POA: Diagnosis present

## 2023-07-03 DIAGNOSIS — M1990 Unspecified osteoarthritis, unspecified site: Secondary | ICD-10-CM | POA: Diagnosis present

## 2023-07-03 DIAGNOSIS — Z8249 Family history of ischemic heart disease and other diseases of the circulatory system: Secondary | ICD-10-CM

## 2023-07-03 DIAGNOSIS — K567 Ileus, unspecified: Secondary | ICD-10-CM | POA: Diagnosis present

## 2023-07-03 DIAGNOSIS — Z885 Allergy status to narcotic agent status: Secondary | ICD-10-CM

## 2023-07-03 DIAGNOSIS — Z888 Allergy status to other drugs, medicaments and biological substances status: Secondary | ICD-10-CM

## 2023-07-03 DIAGNOSIS — E875 Hyperkalemia: Secondary | ICD-10-CM | POA: Diagnosis present

## 2023-07-03 DIAGNOSIS — Y838 Other surgical procedures as the cause of abnormal reaction of the patient, or of later complication, without mention of misadventure at the time of the procedure: Secondary | ICD-10-CM | POA: Diagnosis present

## 2023-07-03 DIAGNOSIS — E861 Hypovolemia: Secondary | ICD-10-CM | POA: Diagnosis present

## 2023-07-03 DIAGNOSIS — Z886 Allergy status to analgesic agent status: Secondary | ICD-10-CM

## 2023-07-03 DIAGNOSIS — Z5320 Procedure and treatment not carried out because of patient's decision for unspecified reasons: Secondary | ICD-10-CM | POA: Diagnosis not present

## 2023-07-03 DIAGNOSIS — F419 Anxiety disorder, unspecified: Secondary | ICD-10-CM | POA: Diagnosis present

## 2023-07-03 DIAGNOSIS — Q809 Congenital ichthyosis, unspecified: Secondary | ICD-10-CM

## 2023-07-03 DIAGNOSIS — K3189 Other diseases of stomach and duodenum: Secondary | ICD-10-CM | POA: Diagnosis present

## 2023-07-03 DIAGNOSIS — E669 Obesity, unspecified: Secondary | ICD-10-CM | POA: Diagnosis present

## 2023-07-03 DIAGNOSIS — K7469 Other cirrhosis of liver: Principal | ICD-10-CM

## 2023-07-03 DIAGNOSIS — Z85038 Personal history of other malignant neoplasm of large intestine: Secondary | ICD-10-CM

## 2023-07-03 DIAGNOSIS — Z9109 Other allergy status, other than to drugs and biological substances: Secondary | ICD-10-CM

## 2023-07-03 DIAGNOSIS — I959 Hypotension, unspecified: Secondary | ICD-10-CM | POA: Diagnosis present

## 2023-07-03 DIAGNOSIS — D6959 Other secondary thrombocytopenia: Secondary | ICD-10-CM | POA: Diagnosis present

## 2023-07-03 DIAGNOSIS — F32A Depression, unspecified: Secondary | ICD-10-CM | POA: Diagnosis present

## 2023-07-03 DIAGNOSIS — Z7682 Awaiting organ transplant status: Secondary | ICD-10-CM

## 2023-07-03 DIAGNOSIS — Z8261 Family history of arthritis: Secondary | ICD-10-CM

## 2023-07-03 DIAGNOSIS — N179 Acute kidney failure, unspecified: Secondary | ICD-10-CM

## 2023-07-03 DIAGNOSIS — K7581 Nonalcoholic steatohepatitis (NASH): Secondary | ICD-10-CM | POA: Diagnosis present

## 2023-07-03 DIAGNOSIS — E8721 Acute metabolic acidosis: Secondary | ICD-10-CM | POA: Diagnosis present

## 2023-07-03 DIAGNOSIS — Z6836 Body mass index (BMI) 36.0-36.9, adult: Secondary | ICD-10-CM

## 2023-07-03 LAB — COMPREHENSIVE METABOLIC PANEL
ALT: 50 U/L — ABNORMAL HIGH (ref 0–44)
AST: 75 U/L — ABNORMAL HIGH (ref 15–41)
Albumin: 2.9 g/dL — ABNORMAL LOW (ref 3.5–5.0)
Alkaline Phosphatase: 125 U/L (ref 38–126)
Anion gap: 11 (ref 5–15)
BUN: 17 mg/dL (ref 6–20)
CO2: 19 mmol/L — ABNORMAL LOW (ref 22–32)
Calcium: 8.7 mg/dL — ABNORMAL LOW (ref 8.9–10.3)
Chloride: 90 mmol/L — ABNORMAL LOW (ref 98–111)
Creatinine, Ser: 1.79 mg/dL — ABNORMAL HIGH (ref 0.61–1.24)
GFR, Estimated: 45 mL/min — ABNORMAL LOW (ref >60)
Glucose, Bld: 104 mg/dL — ABNORMAL HIGH (ref 70–99)
Potassium: 4.6 mmol/L (ref 3.5–5.1)
Sodium: 120 mmol/L — ABNORMAL LOW (ref 135–145)
Total Bilirubin: 3.6 mg/dL — ABNORMAL HIGH (ref 0.0–1.2)
Total Protein: 6 g/dL — ABNORMAL LOW (ref 6.5–8.1)

## 2023-07-03 LAB — CBC WITH DIFFERENTIAL/PLATELET
Abs Immature Granulocytes: 0.03 10*3/uL (ref 0.00–0.07)
Basophils Absolute: 0 10*3/uL (ref 0.0–0.1)
Basophils Relative: 0 %
Eosinophils Absolute: 0 10*3/uL (ref 0.0–0.5)
Eosinophils Relative: 0 %
HCT: 28.1 % — ABNORMAL LOW (ref 39.0–52.0)
Hemoglobin: 9.8 g/dL — ABNORMAL LOW (ref 13.0–17.0)
Immature Granulocytes: 1 %
Lymphocytes Relative: 13 %
Lymphs Abs: 0.9 10*3/uL (ref 0.7–4.0)
MCH: 31.1 pg (ref 26.0–34.0)
MCHC: 34.9 g/dL (ref 30.0–36.0)
MCV: 89.2 fL (ref 80.0–100.0)
Monocytes Absolute: 0.5 10*3/uL (ref 0.1–1.0)
Monocytes Relative: 8 %
Neutro Abs: 5.2 10*3/uL (ref 1.7–7.7)
Neutrophils Relative %: 78 %
Platelets: 123 10*3/uL — ABNORMAL LOW (ref 150–400)
RBC: 3.15 MIL/uL — ABNORMAL LOW (ref 4.22–5.81)
RDW: 14.6 % (ref 11.5–15.5)
WBC: 6.6 10*3/uL (ref 4.0–10.5)
nRBC: 0 % (ref 0.0–0.2)

## 2023-07-03 LAB — GLUCOSE, PLEURAL OR PERITONEAL FLUID: Glucose, Fluid: 109 mg/dL

## 2023-07-03 LAB — PROTEIN, PLEURAL OR PERITONEAL FLUID: Total protein, fluid: <3 g/dL

## 2023-07-03 LAB — ALBUMIN, PLEURAL OR PERITONEAL FLUID: Albumin, Fluid: <1.5 g/dL

## 2023-07-03 LAB — PROTIME-INR
INR: 1.4 — ABNORMAL HIGH (ref 0.8–1.2)
Prothrombin Time: 17.4 s — ABNORMAL HIGH (ref 11.4–15.2)

## 2023-07-03 LAB — LACTATE DEHYDROGENASE, PLEURAL OR PERITONEAL FLUID: LD, Fluid: <25 U/L — ABNORMAL HIGH (ref 3–23)

## 2023-07-03 LAB — AMMONIA: Ammonia: 23 umol/L (ref 9–35)

## 2023-07-03 LAB — LACTIC ACID, PLASMA: Lactic Acid, Venous: 2.2 mmol/L (ref 0.5–1.9)

## 2023-07-03 MED ORDER — ACETAMINOPHEN 650 MG RE SUPP: 650.0000 mg | Freq: Four times a day (QID) | RECTAL | Status: DC | PRN

## 2023-07-03 MED ORDER — FENTANYL CITRATE PF 50 MCG/ML IJ SOSY
50.0000 ug | PREFILLED_SYRINGE | Freq: Once | INTRAMUSCULAR | Status: AC
  Administered 2023-07-03: 50 ug via INTRAVENOUS
  Filled 2023-07-03: qty 1

## 2023-07-03 MED ORDER — AMITRIPTYLINE HCL 10 MG PO TABS
10.0000 mg | ORAL_TABLET | Freq: Every day | ORAL | Status: DC
  Administered 2023-07-03 – 2023-07-09 (×7): 10 mg via ORAL
  Filled 2023-07-03 (×9): qty 1

## 2023-07-03 MED ORDER — ONDANSETRON HCL 4 MG/2ML IJ SOLN: 4.0000 mg | Freq: Four times a day (QID) | INTRAMUSCULAR | Status: DC | PRN

## 2023-07-03 MED ORDER — MIDODRINE HCL 5 MG PO TABS
5.0000 mg | ORAL_TABLET | Freq: Three times a day (TID) | ORAL | Status: DC
  Administered 2023-07-04 – 2023-07-05 (×4): 5 mg via ORAL
  Filled 2023-07-03 (×4): qty 1

## 2023-07-03 MED ORDER — PANTOPRAZOLE SODIUM 40 MG PO TBEC
40.0000 mg | DELAYED_RELEASE_TABLET | Freq: Two times a day (BID) | ORAL | Status: DC
  Administered 2023-07-03 – 2023-07-10 (×14): 40 mg via ORAL
  Filled 2023-07-03 (×14): qty 1

## 2023-07-03 MED ORDER — LACTULOSE 10 GM/15ML PO SOLN
30.0000 g | Freq: Three times a day (TID) | ORAL | Status: DC
  Administered 2023-07-03 – 2023-07-04 (×2): 30 g via ORAL
  Filled 2023-07-03 (×2): qty 45

## 2023-07-03 MED ORDER — ACETAMINOPHEN 325 MG PO TABS
650.0000 mg | ORAL_TABLET | Freq: Four times a day (QID) | ORAL | Status: DC | PRN
  Filled 2023-07-03 (×2): qty 2

## 2023-07-03 MED ORDER — SODIUM CHLORIDE 0.9 % IV SOLN: INTRAVENOUS | Status: DC

## 2023-07-03 MED ORDER — ALBUMIN HUMAN 25 % IV SOLN
25.0000 g | Freq: Once | INTRAVENOUS | Status: AC
  Administered 2023-07-03: 25 g via INTRAVENOUS
  Filled 2023-07-03: qty 100

## 2023-07-03 MED ORDER — SPIRONOLACTONE 25 MG PO TABS
200.0000 mg | ORAL_TABLET | Freq: Every day | ORAL | Status: DC
  Administered 2023-07-03 – 2023-07-04 (×2): 200 mg via ORAL
  Filled 2023-07-03 (×2): qty 8

## 2023-07-03 MED ORDER — LIDOCAINE HCL (PF) 1 % IJ SOLN
10.0000 mL | Freq: Once | INTRAMUSCULAR | Status: AC
  Administered 2023-07-03: 10 mL
  Filled 2023-07-03: qty 10

## 2023-07-03 MED ORDER — ONDANSETRON HCL 4 MG PO TABS
4.0000 mg | ORAL_TABLET | Freq: Four times a day (QID) | ORAL | Status: DC | PRN
  Administered 2023-07-04: 4 mg via ORAL
  Filled 2023-07-03: qty 1

## 2023-07-03 MED ORDER — TORSEMIDE 20 MG PO TABS
40.0000 mg | ORAL_TABLET | Freq: Every day | ORAL | Status: DC
  Administered 2023-07-03 – 2023-07-04 (×2): 40 mg via ORAL
  Filled 2023-07-03 (×2): qty 2

## 2023-07-03 NOTE — ED Notes (Signed)
 Paracentesis procedure

## 2023-07-03 NOTE — ED Notes (Signed)
Lactic  2.2 called from lab

## 2023-07-03 NOTE — H&P (Signed)
 History and Physical    Erik Howe FMW:[id number] DOB: 1970-09-29 DOA: 07/03/2023  PCP: Buck Search, PA-C   Chief Complaint: swelling  HPI: Erik Howe is a 53 y.o. 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  Patient states the last 4 days he has been having progressively worsening shortness of breath and abdominal distention.  He had a paracentesis approximately 2 weeks ago with 7 L removed.  He typically needs a paracentesis twice a month however he has noticed over the last several days his distention in his abdomen has worsened.  He became short of breath so he presented to the ER.  On arrival he was afebrile hemodynamically stable labs were obtained which showed WBC 6.6, hemoglobin 9.8, sodium 120, creatinine 1.8 baseline around 1, AST 75, ALT 50, total bilirubin 3.6.  INR 1.4, lactic acid  2.2.  Patient had a paracentesis performed in the emergency department with results currently pending.  Patient was admitted for further workup.  On admission therapeutic paracentesis was ordered and GI was consulted for further management decompensated cirrhosis.  On evaluation he denied any shortness of breath, confusion or infectious complaints.   Review of Systems: Review of Systems  Constitutional:  Negative for chills and fever.  Respiratory:  Positive for shortness of breath.   Cardiovascular: Negative.   Gastrointestinal:  Positive for abdominal pain.  Genitourinary: Negative.   Musculoskeletal: Negative.   Skin: Negative.   Neurological: Negative.   Psychiatric/Behavioral: Negative.       As per HPI otherwise 10 point review of systems negative.   Allergies  Allergen Reactions   Tylenol  [Acetaminophen ] Other (See Comments)    Told to avoid due to liver issues   Dust Mite Extract    Paroxetine  Other (See Comments)     Stomach issues, felt wrong, and GI Intolerance   Pollen Extract    Tramadol  Nausea Only    Past Medical History:  Diagnosis Date   Anxiety    Arthritis    Cellulitis 12/18/2012   left foot   Cirrhosis of liver (HCC)    Depression    GERD (gastroesophageal reflux disease)    Hypertension    no longer a problem   Obesity    Varicose veins     Past Surgical History:  Procedure Laterality Date   BIOPSY  08/25/2022   Procedure: BIOPSY;  Surgeon: Stacia Glendia BRAVO, MD;  Location: [facility];  Service: Gastroenterology;;   CHOLECYSTECTOMY     COLONOSCOPY WITH PROPOFOL  N/A 08/25/2022   Procedure: COLONOSCOPY WITH PROPOFOL ;  Surgeon: Stacia Glendia BRAVO, MD;  Location: [facility];  Service: Gastroenterology;  Laterality: N/A;   ENDOVENOUS ABLATION SAPHENOUS VEIN W/ LASER Left 09-30-2013   left greater saphenous vein   ESOPHAGOGASTRODUODENOSCOPY (EGD) WITH PROPOFOL  N/A 08/25/2022   Procedure: ESOPHAGOGASTRODUODENOSCOPY (EGD) WITH PROPOFOL ;  Surgeon: Stacia Glendia BRAVO, MD;  Location: [facility];  Service: Gastroenterology;  Laterality: N/A;   HOT HEMOSTASIS N/A 08/25/2022   Procedure: HOT HEMOSTASIS (ARGON PLASMA COAGULATION/BICAP);  Surgeon: Stacia Glendia BRAVO, MD;  Location: [facility];  Service: Gastroenterology;  Laterality: N/A;   IR IVUS EACH ADDITIONAL NON CORONARY VESSEL  07/20/2022   IR PARACENTESIS  05/03/2022   IR PARACENTESIS  05/18/2022   IR PARACENTESIS  06/02/2022   IR PARACENTESIS  06/10/2022   IR PARACENTESIS  06/24/2022   IR PARACENTESIS  06/30/2022  IR PARACENTESIS  07/08/2022   IR PARACENTESIS  07/15/2022   IR PARACENTESIS  07/20/2022   IR PARACENTESIS  07/29/2022   IR PARACENTESIS  08/12/2022   IR PARACENTESIS  08/19/2022   IR PARACENTESIS  09/06/2022   IR PARACENTESIS  09/22/2022   IR PARACENTESIS  10/21/2022   IR PARACENTESIS  11/17/2022   IR PARACENTESIS  12/07/2022   IR PARACENTESIS  12/21/2022   IR PARACENTESIS  01/06/2023   IR PARACENTESIS  01/23/2023   IR  PARACENTESIS  02/23/2023   IR PARACENTESIS  03/09/2023   IR PARACENTESIS  03/31/2023   IR PARACENTESIS  05/01/2023   IR PARACENTESIS  05/16/2023   IR PARACENTESIS  06/19/2023   IR TIPS  07/20/2022   IR US  GUIDE VASC ACCESS RIGHT  07/20/2022   IR US  GUIDE VASC ACCESS RIGHT  07/20/2022   POLYPECTOMY  08/25/2022   Procedure: POLYPECTOMY;  Surgeon: Stacia Glendia BRAVO, MD;  Location: [facility];  Service: Gastroenterology;;   RADIOLOGY WITH ANESTHESIA N/A 07/20/2022   Procedure: TIPS;  Surgeon: Philip Cornet, MD;  Location: [facility] OR;  Service: Radiology;  Laterality: N/A;     reports that he quit smoking about 10 years ago. His smoking use included cigarettes. He has never used smokeless tobacco. He reports that he does not drink alcohol and does not use drugs.  Family History  Problem Relation Age of Onset   Varicose Veins Mother    Heart disease Mother    Diabetes Mother    Arthritis Mother    Hypertension Mother    Stroke Father    Heart disease Father    Colon cancer Neg Hx    Stomach cancer Neg Hx    Rectal cancer Neg Hx    Esophageal cancer Neg Hx     Prior to Admission medications   Medication Sig Start Date End Date Taking? Authorizing Provider  albuterol  (VENTOLIN  HFA) 108 (90 Base) MCG/ACT inhaler Inhale 2 puffs into the lungs every 6 (six) hours as needed for shortness of breath 04/11/22  Yes   amitriptyline  (ELAVIL ) 10 MG tablet Take 1 tablet (10 mg total) by mouth at bedtime. 09/15/22  Yes   ammonium lactate  (LAC-HYDRIN ) 12 % lotion Apply topically as needed for dry skin. Twice a day 03/02/23  Yes   Baclofen  5 MG TABS Take 2 tablets (10 mg total) by mouth 3 (three) times daily. 02/02/23  Yes Drazek, Dawn, CRNP  bismuth subsalicylate (PEPTO BISMOL) 262 MG/15ML suspension Take 30 mLs by mouth every 6 (six) hours as needed for indigestion.   Yes [provider]  clobetasol  ointment (TEMOVATE ) 0.05 % Apply topically 2 (two) times a day. To affected area (right hand) 03/02/23  Yes    diphenhydrAMINE  (BENADRYL ) 50 MG tablet Take 50 mg by mouth every 8 (eight) hours as needed for itching or allergies.   Yes [provider]  docusate sodium  (COLACE) 100 MG capsule Take 200 mg by mouth daily as needed (constipation.).   Yes [provider]  lactulose  (CHRONULAC ) 10 GM/15ML solution Take 3.33 g by mouth 3 (three) times daily.   Yes [provider]  magnesium  oxide (MAG-OX) 400 (240 Mg) MG tablet Take 400 mg by mouth daily.   Yes [provider]  midodrine  (PROAMATINE ) 5 MG tablet Take 1 tablet (5 mg total) by mouth 3 (three) times daily with meals. 06/11/23 07/11/23 Yes Pahwani, Fredia, MD  ondansetron  (ZOFRAN -ODT) 8 MG disintegrating tablet Take 1 tablet (8 mg total) by mouth 3 (three)  times daily as needed for nausea. 06/07/22  Yes   pantoprazole  (PROTONIX ) 40 MG tablet Take 1 tablet (40 mg total) by mouth 2 (two) times daily. 06/29/23  Yes Stacia Glendia BRAVO, MD  potassium chloride  SA (KLOR-CON  M) 20 MEQ tablet Take 2 tablets (40 mEq total) by mouth daily. 09/26/22  Yes Stacia Glendia BRAVO, MD  silver  sulfADIAZINE  (SILVADENE ) 1 % cream Apply topically 2 (two) times a day. To affected areas Patient taking differently: Apply 1 Application topically 2 (two) times daily as needed (for burn, itch). 03/02/23  Yes   spironolactone  (ALDACTONE ) 100 MG tablet Take 2 tablets (200 mg total) by mouth daily. 05/24/23  Yes Raenelle Coria, MD  sucralfate  (CARAFATE ) 1 g tablet Take 1 tablet (1 g total) by mouth 4 (four) times daily -  with meals and at bedtime. Please dissolve the tablet in a tablespoon of water and drink the liquid 4 times a day 06/06/23  Yes Stacia Glendia BRAVO, MD  torsemide  (DEMADEX ) 20 MG tablet Take 2 tablets (40 mg total) by mouth daily. 05/25/23 07/03/23 Yes Raenelle Coria, MD    Physical Exam: Vitals:   07/03/23 1942 07/03/23 1943 07/03/23 1944 07/03/23 1945  BP:    114/61  Pulse: 76 74 77 78  Resp: 10 10 10 13   Temp:      TempSrc:       SpO2: 100% 100% 100% 100%   Physical Exam Constitutional:      Appearance: He is normal weight.  HENT:     Head: Normocephalic.  Cardiovascular:     Rate and Rhythm: Normal rate and regular rhythm.  Pulmonary:     Effort: Pulmonary effort is normal.     Breath sounds: Normal breath sounds.  Abdominal:     General: Abdomen is flat. There is distension.     Palpations: Abdomen is soft.  Skin:    General: Skin is warm.     Capillary Refill: Capillary refill takes less than 2 seconds.  Neurological:     General: No focal deficit present.     Mental Status: He is alert.        Labs on Admission: I have personally reviewed the patients's labs and imaging studies.  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  Patient received albumin .  Will recheck labs in morning.  May need albumin  challenge to confirm diagnosis   Admission status: Observation Telemetry Medical  Certification: The appropriate patient status for this patient is OBSERVATION. Observation status is judged to be reasonable and necessary in order to provide the required intensity of service to ensure the patient's safety. The patient's presenting symptoms, physical exam findings, and initial radiographic and laboratory data in the context of their medical condition is felt to place them at decreased risk for further clinical deterioration. Furthermore, it is  anticipated that the patient will be medically stable for discharge from the hospital within 2 midnights of admission.     Lamar Dess MD [facility] If 7PM-7AM, please contact night-coverage [email or web address]  07/03/2023, 8:38 PM

## 2023-07-03 NOTE — ED Provider Triage Note (Signed)
 Emergency Medicine Provider Triage Evaluation Note  Erik Howe , a 53 y.o. male  was evaluated in triage.  Pt complains of abdominal swelling, weakness, shortness of breath.  Patient with [name] status post TIPS, requires routine paracentesis.  Last paracentesis was 12/30.  In the last 24 hours has had developing confusion as well as significant abdominal distention.  He states his abdomen hurts more than normal as well, and was hurting even before it was distended.  He is also confused from his baseline.  Has history of hepatic encephalopathy requiring admission several times previously.  Does state this feels somewhat different as he is not normally lightheaded  Review of Systems  Positive:  Negative:   Physical Exam  BP 93/63 (BP Location: Right Arm)   Pulse 79   Temp (!) 97.4 F (36.3 C)   Resp 16   SpO2 100%  Gen:   Awake, no distress ill-appearing Resp:  Normal effort  MSK:   Moves extremities without difficulty  Other:  Significant abdominal distention, no focal neurologic deficits.  Medical Decision Making  Medically screening exam initiated at 1:52 PM.  Appropriate orders placed.  Erik Howe was informed that the remainder of the evaluation will be completed by another provider, this initial triage assessment does not replace that evaluation, and the importance of remaining in the ED until their evaluation is complete.  Workup initiated.   Nora Lauraine LABOR, PA-C 07/03/23 1354

## 2023-07-03 NOTE — ED Provider Notes (Signed)
  EMERGENCY DEPARTMENT AT [facility] Provider Note  MDM   HPI/ROS:  Erik Howe States is a 53 y.o. male with pertinent past medical history of Erik Howe cirrhosis s/p TIPS with recurrent ascites requiring frequent large-volume paracentesis, duodenal ulcers, GERD, HTN, anxiety, depression colon cancer who presents with abdominal pain and shortness of breath.  Patient reports a 2-3-day history of worsening dyspnea, abdominal distention and abdominal pain.  He states he last had paracentesis on 12/30 at which time they removed about 6-7 L of fluid.  Usually only has to have therapeutic paracentesis every few weeks but this distention occurred overnight.  He additionally reports back pain and decreasing urination.  He denies any fevers, chest pain, emesis, leg swelling.  Patient's sister states she has not seen him since he was discharged from the hospital however she feels that he appears more jaundiced than prior. Per chart review, patient was admitted 12/18 - 12/22 for acute hepatic encephalopathy.  He reports compliance with his lactulose  and other medications since discharge.  Of note, patient is reportedly undergoing evaluation for liver transplant at [facility].   Physical exam is notable for: - Significant abdominal distention with diffuse moderate tenderness to palpation -- No signs of peritonitis -- No asterixis, GCS 15  On my initial evaluation, patient is:  -Blood pressure intermittently soft to the 90s systolic, however on my assessment improved in the 100s-110s/60s -Otherwise afebrile, hemodynamically stable, and non-toxic appearing. -Additional history obtained from patient's sister and chart review   Based on this patient's current presentation, including their history and physical exam, differential diagnoses include decompensated cirrhosis, SBP, pancreatitis, less likely bowel obstruction, new malignancy, perforated ulcer.   Interpretations, interventions, and the patient's  course of care are documented below.    - Labs notable for a lactic acid  2.2 (stable from 3 weeks ago), INR 1.4, sodium 120, CO2 19 AG 11, creatinine 1.79 (previously 0.90), albumin  2.9, AST 75, ALT 50, hemoglobin 9.8, platelets 123, ammonia 23 -Suspect AKI likely due to hepatorenal syndrome, likely in the setting of dehydration based on exam -Hyponatremia may also be hypovolemic hyponatremia, plan to give patient albumin  and low volume IV fluids -Diagnostic/therapeutic paracentesis obtained given concern for SBP based on history and exam (usually has recurrence of ascites gradually over 2-3 weeks, however symptoms have been much more acute onset this time though it is reassuring that patient does not have a leukocytosis and is afebrile)  -Paracentesis labs were pending at time of admission.  This was communicated to the admitting team and will defer antibiotic administration to their discretion given overall hemodynamic stability and no significant concern based on appearance of paracentesis fluid  Disposition:  I discussed the case with the internal medicine service who graciously agreed to admit the patient to their service for continued care.  Please see inpatient provider note for further treatment plan details  Clinical Impression:  1. Other cirrhosis of liver (HCC)   2. Hyponatremia     Rx / DC Orders ED Discharge Orders     None       The plan for this patient was discussed with Dr. Freddi, who voiced agreement and who oversaw evaluation and treatment of this patient.   Clinical Complexity A medically appropriate history, review of systems, and physical exam was performed.  My independent interpretations of EKG, labs, and radiology are documented in the ED course above.   If decision rules were used in this patient's evaluation, they are listed below.   Patient's presentation  is most consistent with acute presentation with potential threat to life or bodily  function.  Medical Decision Making Amount and/or Complexity of Data Reviewed Labs: ordered. Radiology: ordered.  Risk Prescription drug management. Decision regarding hospitalization.    HPI/ROS      See MDM section for pertinent HPI and ROS. A complete ROS was performed with pertinent positives/negatives noted above.   Past Medical History:  Diagnosis Date   Anxiety    Arthritis    Cellulitis 12/18/2012   left foot   Cirrhosis of liver (HCC)    Depression    GERD (gastroesophageal reflux disease)    Hypertension    no longer a problem   Obesity    Varicose veins     Past Surgical History:  Procedure Laterality Date   BIOPSY  08/25/2022   Procedure: BIOPSY;  Surgeon: Stacia Glendia BRAVO, MD;  Location: [facility];  Service: Gastroenterology;;   CHOLECYSTECTOMY     COLONOSCOPY WITH PROPOFOL  N/A 08/25/2022   Procedure: COLONOSCOPY WITH PROPOFOL ;  Surgeon: Stacia Glendia BRAVO, MD;  Location: [facility];  Service: Gastroenterology;  Laterality: N/A;   ENDOVENOUS ABLATION SAPHENOUS VEIN W/ LASER Left 09-30-2013   left greater saphenous vein   ESOPHAGOGASTRODUODENOSCOPY (EGD) WITH PROPOFOL  N/A 08/25/2022   Procedure: ESOPHAGOGASTRODUODENOSCOPY (EGD) WITH PROPOFOL ;  Surgeon: Stacia Glendia BRAVO, MD;  Location: [facility];  Service: Gastroenterology;  Laterality: N/A;   HOT HEMOSTASIS N/A 08/25/2022   Procedure: HOT HEMOSTASIS (ARGON PLASMA COAGULATION/BICAP);  Surgeon: Stacia Glendia BRAVO, MD;  Location: [facility];  Service: Gastroenterology;  Laterality: N/A;   IR IVUS EACH ADDITIONAL NON CORONARY VESSEL  07/20/2022   IR PARACENTESIS  05/03/2022   IR PARACENTESIS  05/18/2022   IR PARACENTESIS  06/02/2022   IR PARACENTESIS  06/10/2022   IR PARACENTESIS  06/24/2022   IR PARACENTESIS  06/30/2022   IR PARACENTESIS  07/08/2022   IR PARACENTESIS  07/15/2022   IR PARACENTESIS  07/20/2022   IR PARACENTESIS  07/29/2022   IR PARACENTESIS  08/12/2022   IR PARACENTESIS  08/19/2022   IR  PARACENTESIS  09/06/2022   IR PARACENTESIS  09/22/2022   IR PARACENTESIS  10/21/2022   IR PARACENTESIS  11/17/2022   IR PARACENTESIS  12/07/2022   IR PARACENTESIS  12/21/2022   IR PARACENTESIS  01/06/2023   IR PARACENTESIS  01/23/2023   IR PARACENTESIS  02/23/2023   IR PARACENTESIS  03/09/2023   IR PARACENTESIS  03/31/2023   IR PARACENTESIS  05/01/2023   IR PARACENTESIS  05/16/2023   IR PARACENTESIS  06/19/2023   IR TIPS  07/20/2022   IR US  GUIDE VASC ACCESS RIGHT  07/20/2022   IR US  GUIDE VASC ACCESS RIGHT  07/20/2022   POLYPECTOMY  08/25/2022   Procedure: POLYPECTOMY;  Surgeon: Stacia Glendia BRAVO, MD;  Location: [facility];  Service: Gastroenterology;;   RADIOLOGY WITH ANESTHESIA N/A 07/20/2022   Procedure: TIPS;  Surgeon: Philip Cornet, MD;  Location: [facility] OR;  Service: Radiology;  Laterality: N/A;      Physical Exam   Vitals:   07/03/23 1341 07/03/23 1607  BP: 93/63 (!) 88/54  Pulse: 79 69  Resp: 16 18  Temp: (!) 97.4 F (36.3 C)   SpO2: 100% 99%    Physical Exam Gen: NAD. Appears comfortable HENT: Conjunctiva clear, PERRL, EOMI. MMM.  CV: RRR. No M/R/G Pulm: Lungs CTAB with no wheezing, rales, or rhonchi.  GI: Abdomen significantly distended, more tense and upper versus lower though not peritonitic.  Diffuse mild tenderness to palpation.  Fluid wave present normal bowel sounds in all 4 quadrants. MSK/Skin: 2+ pitting edema bilateral lower extremities.  Intact distal pulses in all 4 extremities..  No significant jaundice Neuro: A&Ox3. GCS 15. Moves all extremities.  No asterixis present    Procedures   If procedures were preformed on this patient, they are listed below:  Paracentesis  Date/Time: 07/03/2023 8:30 PM  Performed by: Larna Raring, MD Authorized by: Freddi Hamilton, MD   Consent:    Consent obtained:  Written   Consent given by:  Patient   Risks, benefits, and alternatives were discussed: yes     Risks discussed:  Bleeding, bowel perforation, infection and  pain   Alternatives discussed:  Delayed treatment and no treatment Universal protocol:    Site/side marked: yes     Immediately prior to procedure, a time out was called: yes     Patient identity confirmed:  Arm band and hospital-assigned identification number Pre-procedure details:    Procedure purpose:  Diagnostic   Preparation: Patient was prepped and draped in usual sterile fashion   Anesthesia:    Anesthesia method:  Local infiltration   Local anesthetic:  Lidocaine  1% w/o epi Procedure details:    Puncture site:  R lower quadrant   Fluid removed amount:  3100 mL   Fluid appearance:  Serosanguinous Post-procedure details:    Procedure completion:  Tolerated well, no immediate complications    Raring Larna, MD Emergency Medicine PGY-2   Please note that this documentation was produced with the assistance of voice-to-text technology and may contain errors.    Larna Raring, MD 07/04/23 [id number]    Freddi Hamilton, MD 07/04/23 [phone]

## 2023-07-03 NOTE — ED Triage Notes (Signed)
 Pt reports right and left sided abdominal pain. Pt with hx of ascites  and liver failure. Pt reports feeling lightheaded.

## 2023-07-04 ENCOUNTER — Inpatient Hospital Stay (HOSPITAL_COMMUNITY): Payer: Medicaid Other

## 2023-07-04 ENCOUNTER — Observation Stay (HOSPITAL_COMMUNITY): Payer: Medicaid Other

## 2023-07-04 DIAGNOSIS — Z7682 Awaiting organ transplant status: Secondary | ICD-10-CM | POA: Diagnosis not present

## 2023-07-04 DIAGNOSIS — I50810 Right heart failure, unspecified: Secondary | ICD-10-CM | POA: Diagnosis present

## 2023-07-04 DIAGNOSIS — K7581 Nonalcoholic steatohepatitis (NASH): Secondary | ICD-10-CM | POA: Diagnosis present

## 2023-07-04 DIAGNOSIS — F32A Depression, unspecified: Secondary | ICD-10-CM | POA: Diagnosis present

## 2023-07-04 DIAGNOSIS — K7031 Alcoholic cirrhosis of liver with ascites: Secondary | ICD-10-CM | POA: Diagnosis present

## 2023-07-04 DIAGNOSIS — Y838 Other surgical procedures as the cause of abnormal reaction of the patient, or of later complication, without mention of misadventure at the time of the procedure: Secondary | ICD-10-CM | POA: Diagnosis present

## 2023-07-04 DIAGNOSIS — I110 Hypertensive heart disease with heart failure: Secondary | ICD-10-CM | POA: Diagnosis present

## 2023-07-04 DIAGNOSIS — Z79899 Other long term (current) drug therapy: Secondary | ICD-10-CM | POA: Diagnosis not present

## 2023-07-04 DIAGNOSIS — D6959 Other secondary thrombocytopenia: Secondary | ICD-10-CM | POA: Diagnosis present

## 2023-07-04 DIAGNOSIS — D638 Anemia in other chronic diseases classified elsewhere: Secondary | ICD-10-CM | POA: Diagnosis present

## 2023-07-04 DIAGNOSIS — T82898A Other specified complication of vascular prosthetic devices, implants and grafts, initial encounter: Secondary | ICD-10-CM | POA: Diagnosis present

## 2023-07-04 DIAGNOSIS — K767 Hepatorenal syndrome: Secondary | ICD-10-CM | POA: Diagnosis present

## 2023-07-04 DIAGNOSIS — I8510 Secondary esophageal varices without bleeding: Secondary | ICD-10-CM | POA: Diagnosis present

## 2023-07-04 DIAGNOSIS — E669 Obesity, unspecified: Secondary | ICD-10-CM | POA: Diagnosis present

## 2023-07-04 DIAGNOSIS — K766 Portal hypertension: Secondary | ICD-10-CM | POA: Diagnosis present

## 2023-07-04 DIAGNOSIS — E871 Hypo-osmolality and hyponatremia: Secondary | ICD-10-CM | POA: Diagnosis present

## 2023-07-04 DIAGNOSIS — E861 Hypovolemia: Secondary | ICD-10-CM | POA: Diagnosis present

## 2023-07-04 DIAGNOSIS — Z87891 Personal history of nicotine dependence: Secondary | ICD-10-CM | POA: Diagnosis not present

## 2023-07-04 DIAGNOSIS — E875 Hyperkalemia: Secondary | ICD-10-CM | POA: Diagnosis present

## 2023-07-04 DIAGNOSIS — Z8249 Family history of ischemic heart disease and other diseases of the circulatory system: Secondary | ICD-10-CM | POA: Diagnosis not present

## 2023-07-04 DIAGNOSIS — N179 Acute kidney failure, unspecified: Secondary | ICD-10-CM | POA: Diagnosis present

## 2023-07-04 DIAGNOSIS — K7290 Hepatic failure, unspecified without coma: Secondary | ICD-10-CM | POA: Diagnosis not present

## 2023-07-04 DIAGNOSIS — K567 Ileus, unspecified: Secondary | ICD-10-CM | POA: Diagnosis present

## 2023-07-04 DIAGNOSIS — G4700 Insomnia, unspecified: Secondary | ICD-10-CM | POA: Diagnosis present

## 2023-07-04 DIAGNOSIS — K2100 Gastro-esophageal reflux disease with esophagitis, without bleeding: Secondary | ICD-10-CM | POA: Diagnosis present

## 2023-07-04 DIAGNOSIS — R0602 Shortness of breath: Secondary | ICD-10-CM | POA: Diagnosis present

## 2023-07-04 DIAGNOSIS — K7460 Unspecified cirrhosis of liver: Secondary | ICD-10-CM | POA: Diagnosis not present

## 2023-07-04 DIAGNOSIS — Q211 Atrial septal defect, unspecified: Secondary | ICD-10-CM | POA: Diagnosis not present

## 2023-07-04 DIAGNOSIS — E8721 Acute metabolic acidosis: Secondary | ICD-10-CM | POA: Diagnosis present

## 2023-07-04 HISTORY — PX: IR PARACENTESIS: IMG2679

## 2023-07-04 LAB — BASIC METABOLIC PANEL
Anion gap: 10 (ref 5–15)
BUN: 21 mg/dL — ABNORMAL HIGH (ref 6–20)
CO2: 20 mmol/L — ABNORMAL LOW (ref 22–32)
Calcium: 9 mg/dL (ref 8.9–10.3)
Chloride: 91 mmol/L — ABNORMAL LOW (ref 98–111)
Creatinine, Ser: 2.16 mg/dL — ABNORMAL HIGH (ref 0.61–1.24)
GFR, Estimated: 36 mL/min — ABNORMAL LOW (ref >60)
Glucose, Bld: 94 mg/dL (ref 70–99)
Potassium: 5 mmol/L (ref 3.5–5.1)
Sodium: 121 mmol/L — ABNORMAL LOW (ref 135–145)

## 2023-07-04 LAB — URINALYSIS, ROUTINE W REFLEX MICROSCOPIC
Bilirubin Urine: NEGATIVE
Glucose, UA: NEGATIVE mg/dL
Ketones, ur: NEGATIVE mg/dL
Nitrite: NEGATIVE
Protein, ur: NEGATIVE mg/dL
Specific Gravity, Urine: 1.025 (ref 1.005–1.030)
WBC, UA: >50 WBC/hpf (ref 0–5)
pH: 5 (ref 5.0–8.0)

## 2023-07-04 LAB — CBC
HCT: 25.2 % — ABNORMAL LOW (ref 39.0–52.0)
Hemoglobin: 9.1 g/dL — ABNORMAL LOW (ref 13.0–17.0)
MCH: 31.9 pg (ref 26.0–34.0)
MCHC: 36.1 g/dL — ABNORMAL HIGH (ref 30.0–36.0)
MCV: 88.4 fL (ref 80.0–100.0)
Platelets: 100 10*3/uL — ABNORMAL LOW (ref 150–400)
RBC: 2.85 MIL/uL — ABNORMAL LOW (ref 4.22–5.81)
RDW: 14.7 % (ref 11.5–15.5)
WBC: 6.1 10*3/uL (ref 4.0–10.5)
nRBC: 0 % (ref 0.0–0.2)

## 2023-07-04 LAB — COMPREHENSIVE METABOLIC PANEL
ALT: 48 U/L — ABNORMAL HIGH (ref 0–44)
AST: 66 U/L — ABNORMAL HIGH (ref 15–41)
Albumin: 3 g/dL — ABNORMAL LOW (ref 3.5–5.0)
Alkaline Phosphatase: 108 U/L (ref 38–126)
Anion gap: 9 (ref 5–15)
BUN: 19 mg/dL (ref 6–20)
CO2: 20 mmol/L — ABNORMAL LOW (ref 22–32)
Calcium: 9 mg/dL (ref 8.9–10.3)
Chloride: 92 mmol/L — ABNORMAL LOW (ref 98–111)
Creatinine, Ser: 1.92 mg/dL — ABNORMAL HIGH (ref 0.61–1.24)
GFR, Estimated: 41 mL/min — ABNORMAL LOW (ref >60)
Glucose, Bld: 98 mg/dL (ref 70–99)
Potassium: 4.7 mmol/L (ref 3.5–5.1)
Sodium: 121 mmol/L — ABNORMAL LOW (ref 135–145)
Total Bilirubin: 3.6 mg/dL — ABNORMAL HIGH (ref 0.0–1.2)
Total Protein: 5.8 g/dL — ABNORMAL LOW (ref 6.5–8.1)

## 2023-07-04 LAB — BODY FLUID CELL COUNT WITH DIFFERENTIAL
Eos, Fluid: 0 %
Lymphs, Fluid: 90 %
Monocyte-Macrophage-Serous Fluid: 4 % — ABNORMAL LOW (ref 50–90)
Neutrophil Count, Fluid: 6 % (ref 0–25)
Total Nucleated Cell Count, Fluid: 78 uL (ref 0–1000)

## 2023-07-04 LAB — PATHOLOGIST SMEAR REVIEW: Path Review: REACTIVE

## 2023-07-04 LAB — LACTIC ACID, PLASMA: Lactic Acid, Venous: 1.7 mmol/L (ref 0.5–1.9)

## 2023-07-04 MED ORDER — IOHEXOL 350 MG/ML SOLN
100.0000 mL | Freq: Once | INTRAVENOUS | Status: AC | PRN
  Administered 2023-07-04: 100 mL via INTRAVENOUS

## 2023-07-04 MED ORDER — LACTULOSE 10 GM/15ML PO SOLN
30.0000 g | Freq: Four times a day (QID) | ORAL | Status: DC
  Administered 2023-07-04 – 2023-07-05 (×3): 30 g via ORAL
  Filled 2023-07-04 (×3): qty 45

## 2023-07-04 MED ORDER — OXYCODONE HCL 5 MG PO TABS
5.0000 mg | ORAL_TABLET | Freq: Four times a day (QID) | ORAL | Status: DC | PRN
  Administered 2023-07-04 – 2023-07-07 (×7): 5 mg via ORAL
  Filled 2023-07-04 (×7): qty 1

## 2023-07-04 MED ORDER — LIDOCAINE HCL 1 % IJ SOLN
INTRAMUSCULAR | Status: AC
  Filled 2023-07-04: qty 20

## 2023-07-04 MED ORDER — RIFAXIMIN 550 MG PO TABS
550.0000 mg | ORAL_TABLET | Freq: Two times a day (BID) | ORAL | Status: DC
  Administered 2023-07-04 – 2023-07-10 (×13): 550 mg via ORAL
  Filled 2023-07-04 (×13): qty 1

## 2023-07-04 MED ORDER — ENSURE ENLIVE PO LIQD
237.0000 mL | Freq: Two times a day (BID) | ORAL | Status: DC
  Administered 2023-07-05 – 2023-07-10 (×9): 237 mL via ORAL

## 2023-07-04 MED ORDER — ORAL CARE MOUTH RINSE: 15.0000 mL | OROMUCOSAL | Status: DC | PRN

## 2023-07-04 MED ORDER — LIDOCAINE HCL 1 % IJ SOLN
20.0000 mL | Freq: Once | INTRAMUSCULAR | Status: AC
  Administered 2023-07-04: 15 mL via INTRADERMAL

## 2023-07-04 MED ORDER — ALBUMIN HUMAN 25 % IV SOLN
25.0000 g | Freq: Once | INTRAVENOUS | Status: AC
  Administered 2023-07-04: 25 g via INTRAVENOUS
  Filled 2023-07-04: qty 100

## 2023-07-04 NOTE — Procedures (Signed)
 PROCEDURE SUMMARY:  Successful ultrasound guided paracentesis from the right lower quadrant.  Yielded 6.1 L of clear yellow fluid.  No immediate complications.  The patient tolerated the procedure well.   Specimen not sent for labs.  EBL < 5mL  Patient is s/p TIPS 07/20/22 with Dr. Philip Warren Dais, AGACNP-BC 07/04/2023, 1:39 PM

## 2023-07-04 NOTE — ED Notes (Signed)
 ED TO INPATIENT HANDOFF REPORT  ED Nurse Name and Phone #: Lorenza [phone]  S Name/Age/Gender Erik Howe 53 y.o. male Room/Bed: 009C/009C  Code Status   Code Status: Full Code  Home/SNF/Other Home Patient oriented to: self, place, time, and situation Is this baseline? Yes   Triage Complete: Triage complete  Chief Complaint Decompensated cirrhosis (HCC) [K72.90, K74.60]  Triage Note Pt reports right and left sided abdominal pain. Pt with hx of ascites  and liver failure. Pt reports feeling lightheaded.   Allergies Allergies  Allergen Reactions   Tylenol  [Acetaminophen ] Other (See Comments)    Told to avoid due to liver issues   Dust Mite Extract    Paroxetine  Other (See Comments)    Stomach issues, felt wrong, and GI Intolerance   Pollen Extract    Tramadol  Nausea Only    Level of Care/Admitting Diagnosis ED Disposition     ED Disposition  Admit   Condition  --   Comment  Hospital Area: [facility] [[id number]]  Level of Care: Telemetry Medical [104]  May admit patient to [facility] or [facility] if equivalent level of care is available:: No  Covid Evaluation: Asymptomatic - no recent exposure (last 10 days) testing not required  Diagnosis: Decompensated cirrhosis [facility]) [[id number]]  Admitting Physician: DENA CHARLESTON [[id number]]  Attending Physician: SOLEDAD LIGAS [phone]  Certification:: I certify this patient will need inpatient services for at least 2 midnights  Expected Medical Readiness: 07/06/2023          B Medical/Surgery History Past Medical History:  Diagnosis Date   Anxiety    Arthritis    Cellulitis 12/18/2012   left foot   Cirrhosis of liver (HCC)    Depression    GERD (gastroesophageal reflux disease)    Hypertension    no longer a problem   Obesity    Varicose veins    Past Surgical History:  Procedure Laterality Date   BIOPSY  08/25/2022   Procedure: BIOPSY;  Surgeon: Stacia Glendia BRAVO, MD;  Location: [facility];  Service: Gastroenterology;;   CHOLECYSTECTOMY     COLONOSCOPY WITH PROPOFOL  N/A 08/25/2022   Procedure: COLONOSCOPY WITH PROPOFOL ;  Surgeon: Stacia Glendia BRAVO, MD;  Location: [facility];  Service: Gastroenterology;  Laterality: N/A;   ENDOVENOUS ABLATION SAPHENOUS VEIN W/ LASER Left 09-30-2013   left greater saphenous vein   ESOPHAGOGASTRODUODENOSCOPY (EGD) WITH PROPOFOL  N/A 08/25/2022   Procedure: ESOPHAGOGASTRODUODENOSCOPY (EGD) WITH PROPOFOL ;  Surgeon: Stacia Glendia BRAVO, MD;  Location: [facility];  Service: Gastroenterology;  Laterality: N/A;   HOT HEMOSTASIS N/A 08/25/2022   Procedure: HOT HEMOSTASIS (ARGON PLASMA COAGULATION/BICAP);  Surgeon: Stacia Glendia BRAVO, MD;  Location: [facility];  Service: Gastroenterology;  Laterality: N/A;   IR IVUS EACH ADDITIONAL NON CORONARY VESSEL  07/20/2022   IR PARACENTESIS  05/03/2022   IR PARACENTESIS  05/18/2022   IR PARACENTESIS  06/02/2022   IR PARACENTESIS  06/10/2022   IR PARACENTESIS  06/24/2022   IR PARACENTESIS  06/30/2022   IR PARACENTESIS  07/08/2022   IR PARACENTESIS  07/15/2022   IR PARACENTESIS  07/20/2022   IR PARACENTESIS  07/29/2022   IR PARACENTESIS  08/12/2022   IR PARACENTESIS  08/19/2022   IR PARACENTESIS  09/06/2022   IR PARACENTESIS  09/22/2022   IR PARACENTESIS  10/21/2022   IR PARACENTESIS  11/17/2022   IR PARACENTESIS  12/07/2022   IR PARACENTESIS  12/21/2022   IR PARACENTESIS  01/06/2023   IR PARACENTESIS  01/23/2023  IR PARACENTESIS  02/23/2023   IR PARACENTESIS  03/09/2023   IR PARACENTESIS  03/31/2023   IR PARACENTESIS  05/01/2023   IR PARACENTESIS  05/16/2023   IR PARACENTESIS  06/19/2023   IR TIPS  07/20/2022   IR US  GUIDE VASC ACCESS RIGHT  07/20/2022   IR US  GUIDE VASC ACCESS RIGHT  07/20/2022   POLYPECTOMY  08/25/2022   Procedure: POLYPECTOMY;  Surgeon: Stacia Glendia BRAVO, MD;  Location: [facility];  Service: Gastroenterology;;   RADIOLOGY WITH ANESTHESIA N/A 07/20/2022   Procedure: TIPS;  Surgeon: Philip Cornet,  MD;  Location: [facility] OR;  Service: Radiology;  Laterality: N/A;     A IV Location/Drains/Wounds Patient Lines/Drains/Airways Status     Active Line/Drains/Airways     Name Placement date Placement time Site Days   Peripheral IV 07/03/23 20 G Anterior;Distal;Left;Upper Arm 07/03/23  1846  Arm  1   Peripheral IV 07/04/23 22 G Anterior;Right Forearm 07/04/23  0938  Forearm  less than 1   Wound / Incision (Open or Dehisced) 07/20/22 Puncture Thigh Anterior;Proximal;Right 61fr sheat site 07/20/22  1510  Thigh  349   Wound / Incision (Open or Dehisced) 07/20/22 Puncture Abdomen Right;Upper paracentesis site 07/20/22  1512  Abdomen  349   Wound / Incision (Open or Dehisced) 07/20/22 Puncture Throat Right 07/20/22  1513  Throat  349            Intake/Output Last 24 hours No intake or output data in the 24 hours ending 07/04/23 1317  Labs/Imaging Results for orders placed or performed during the hospital encounter of 07/03/23 (from the past 48 hours)  Ammonia     Status: None   Collection Time: 07/03/23  1:54 PM  Result Value Ref Range   Ammonia 23 9 - 35 umol/L    Comment: Performed at [facility], [address] [place], [place] [address]  CBC with Differential     Status: Abnormal   Collection Time: 07/03/23  1:54 PM  Result Value Ref Range   WBC 6.6 4.0 - 10.5 K/uL   RBC 3.15 (L) 4.22 - 5.81 MIL/uL   Hemoglobin 9.8 (L) 13.0 - 17.0 g/dL   HCT 71.8 (L) 60.9 - 47.9 %   MCV 89.2 80.0 - 100.0 fL   MCH 31.1 26.0 - 34.0 pg   MCHC 34.9 30.0 - 36.0 g/dL   RDW 85.3 88.4 - 84.4 %   Platelets 123 (L) 150 - 400 K/uL    Comment: REPEATED TO VERIFY   nRBC 0.0 0.0 - 0.2 %   Neutrophils Relative % 78 %   Neutro Abs 5.2 1.7 - 7.7 K/uL   Lymphocytes Relative 13 %   Lymphs Abs 0.9 0.7 - 4.0 K/uL   Monocytes Relative 8 %   Monocytes Absolute 0.5 0.1 - 1.0 K/uL   Eosinophils Relative 0 %   Eosinophils Absolute 0.0 0.0 - 0.5 K/uL   Basophils Relative 0 %   Basophils Absolute 0.0 0.0 -  0.1 K/uL   Immature Granulocytes 1 %   Abs Immature Granulocytes 0.03 0.00 - 0.07 K/uL    Comment: Performed at [facility], [address] [place], [place] [address]  Comprehensive metabolic panel     Status: Abnormal   Collection Time: 07/03/23  1:54 PM  Result Value Ref Range   Sodium 120 (L) 135 - 145 mmol/L   Potassium 4.6 3.5 - 5.1 mmol/L   Chloride 90 (L) 98 - 111 mmol/L   CO2  19 (L) 22 - 32 mmol/L   Glucose, Bld 104 (H) 70 - 99 mg/dL    Comment: Glucose reference range applies only to samples taken after fasting for at least 8 hours.   BUN 17 6 - 20 mg/dL   Creatinine, Ser 8.20 (H) 0.61 - 1.24 mg/dL   Calcium  8.7 (L) 8.9 - 10.3 mg/dL   Total Protein 6.0 (L) 6.5 - 8.1 g/dL   Albumin  2.9 (L) 3.5 - 5.0 g/dL   AST 75 (H) 15 - 41 U/L   ALT 50 (H) 0 - 44 U/L   Alkaline Phosphatase 125 38 - 126 U/L   Total Bilirubin 3.6 (H) 0.0 - 1.2 mg/dL   GFR, Estimated 45 (L) >60 mL/min    Comment: (NOTE) Calculated using the CKD-EPI Creatinine Equation (2021)    Anion gap 11 5 - 15    Comment: Performed at [facility], [address], [place], [place] [address]  Protime-INR     Status: Abnormal   Collection Time: 07/03/23  1:54 PM  Result Value Ref Range   Prothrombin Time 17.4 (H) 11.4 - 15.2 seconds   INR 1.4 (H) 0.8 - 1.2    Comment: (NOTE) INR goal varies based on device and disease states. Performed at [facility], [address] [place], [place] [address]   Lactic acid , plasma     Status: Abnormal   Collection Time: 07/03/23  1:54 PM  Result Value Ref Range   Lactic Acid , Venous 2.2 (HH) 0.5 - 1.9 mmol/L    Comment: CRITICAL RESULT CALLED TO, READ BACK BY AND VERIFIED WITH J BLUE RN 1450, 07/03/2023 SONJIA RAMAN Performed at [facility], [address] [place], [place] [address]   Body fluid culture w Gram Stain     Status: None (Preliminary result)   Collection Time: 07/03/23  4:39 PM   Specimen: Peritoneal Cavity; Peritoneal Fluid  Result  Value Ref Range   Specimen Description PERITONEAL CAVITY    Special Requests NONE    Gram Stain NO WBC SEEN NO ORGANISMS SEEN     Culture      NO GROWTH < 12 HOURS Performed at [facility], [address] [place], [place] [address]    Report Status PENDING   Lactate dehydrogenase (pleural or peritoneal fluid)     Status: Abnormal   Collection Time: 07/03/23  7:32 PM  Result Value Ref Range   LD, Fluid <25 (H) 3 - 23 U/L    Comment: (NOTE) Results should be evaluated in conjunction with serum values    Fluid Type-FLDH PERITONEAL CAVITY     Comment: Performed at [facility], [address] [place], [place] [address]  Glucose, pleural or peritoneal fluid     Status: None   Collection Time: 07/03/23  7:32 PM  Result Value Ref Range   Glucose, Fluid 109 mg/dL    Comment: (NOTE) No normal range established for this test Results should be evaluated in conjunction with serum values    Fluid Type-FGLU PERITONEAL CAVITY     Comment: Performed at [facility], [address], [place], [place] [address]  Protein, pleural or peritoneal fluid     Status: None   Collection Time: 07/03/23  7:32 PM  Result Value Ref Range   Total protein, fluid <3.0 g/dL    Comment: (NOTE) No normal range established for this test Results should be evaluated in conjunction with serum values  Fluid Type-FTP PERITONEAL CAVITY     Comment: Performed at [facility], [address] [address] [place], [place] [address]  Albumin , pleural or peritoneal fluid      Status: None   Collection Time: 07/03/23  7:32 PM  Result Value Ref Range   Albumin , Fluid <1.5 g/dL    Comment: (NOTE) No normal range established for this test Results should be evaluated in conjunction with serum values    Fluid Type-FALB PERITONEAL CAVITY     Comment: Performed at [facility], [address] [place], [place] [address]  Body fluid cell count with differential     Status: Abnormal   Collection Time:  07/03/23  7:32 PM  Result Value Ref Range   Fluid Type-FCT Peritoneal    Color, Fluid ORANGE    Appearance, Fluid CLOUDY (A) CLEAR   Total Nucleated Cell Count, Fluid 78 0 - 1,000 cu mm   Neutrophil Count, Fluid 6 0 - 25 %   Lymphs, Fluid 90 %   Monocyte-Macrophage-Serous Fluid 4 (L) 50 - 90 %   Eos, Fluid 0 %   Other Cells, Fluid RARE MESOTHELIAL CELLS %    Comment: Performed at [facility], [address], [place], [place] [address]  Pathologist smear review     Status: None   Collection Time: 07/03/23  7:32 PM  Result Value Ref Range   Path Review Reactive mesothelial cells and inflammatory cells     Comment: Reviewed by Pepper Dutton, M.D. 07/04/2023 Performed at [facility], [address], [place], [place] [address]   Lactic acid , plasma     Status: None   Collection Time: 07/04/23  5:51 AM  Result Value Ref Range   Lactic Acid , Venous 1.7 0.5 - 1.9 mmol/L    Comment: Performed at [facility], [address] [place], [place] [address]  CBC     Status: Abnormal   Collection Time: 07/04/23  5:51 AM  Result Value Ref Range   WBC 6.1 4.0 - 10.5 K/uL   RBC 2.85 (L) 4.22 - 5.81 MIL/uL   Hemoglobin 9.1 (L) 13.0 - 17.0 g/dL   HCT 74.7 (L) 60.9 - 47.9 %   MCV 88.4 80.0 - 100.0 fL   MCH 31.9 26.0 - 34.0 pg   MCHC 36.1 (H) 30.0 - 36.0 g/dL   RDW 85.2 88.4 - 84.4 %   Platelets 100 (L) 150 - 400 K/uL   nRBC 0.0 0.0 - 0.2 %    Comment: Performed at [facility], [address] [place], [place] [address]  Comprehensive metabolic panel     Status: Abnormal   Collection Time: 07/04/23  5:51 AM  Result Value Ref Range   Sodium 121 (L) 135 - 145 mmol/L   Potassium 4.7 3.5 - 5.1 mmol/L   Chloride 92 (L) 98 - 111 mmol/L   CO2 20 (L) 22 - 32 mmol/L   Glucose, Bld 98 70 - 99 mg/dL    Comment: Glucose reference range applies only to samples taken after fasting for at least 8 hours.   BUN 19 6 - 20 mg/dL   Creatinine, Ser 8.07 (H) 0.61 - 1.24 mg/dL   Calcium   9.0 8.9 - 10.3 mg/dL   Total Protein 5.8 (L) 6.5 - 8.1 g/dL   Albumin  3.0 (L) 3.5 - 5.0 g/dL   AST 66 (H) 15 - 41 U/L   ALT 48 (H) 0 - 44 U/L  Alkaline Phosphatase 108 38 - 126 U/L   Total Bilirubin 3.6 (H) 0.0 - 1.2 mg/dL   GFR, Estimated 41 (L) >60 mL/min    Comment: (NOTE) Calculated using the CKD-EPI Creatinine Equation (2021)    Anion gap 9 5 - 15    Comment: Performed at [facility], [address] [place], [place] [address]   US  ABDOMEN LIMITED WITH LIVER DOPPLER Result Date: 07/04/2023 CLINICAL DATA:  Decompensated cirrhosis, post TIPS creation 07/20/2022 EXAM: DUPLEX ULTRASOUND OF LIVER TECHNIQUE: Color and duplex Doppler ultrasound was performed to evaluate the hepatic in-flow and out-flow vessels. COMPARISON:  None Available. FINDINGS: Liver: Small liver with nodular contour. No focal lesion, mass or intrahepatic biliary ductal dilatation. Main Portal Vein size: 0.6 cm cm Portal Vein Velocities (hepatopetal): Main Prox:  17 cm/sec Main Mid: 22 cm/sec Main Dist:  19 cm/sec Right: Limited visualization, flow signal in the proximal aspect TIPS stent is identified, with no internal flow or color Doppler signal in its mid and hepatic vein components. Hepatic Vein Velocities (hepatofugal): Right:  27 cm/sec Middle:  28 cm/sec Left:  23 cm/sec IVC: Present and patent with normal respiratory phasicity. Velocity 24 cm/sec. Hepatic Artery Velocity:  89 cm/sec Splenic Vein Velocity:  22 cm/sec Spleen: 11.7 cm x 10.9 cm x 3.8 cm with a total volume of 252 cm^3 (411 cm^3 is upper limit normal) Portal Vein Occlusion/Thrombus: No Splenic Vein Occlusion/Thrombus: No Ascites: Small to moderate Varices: None IMPRESSION: 1. Occluded TIPS stent with no internal flow or color Doppler signal in its mid and hepatic vein components. 2. Small liver with nodular contour. 3. Small to moderate ascites. Electronically Signed   By: JONETTA Faes M.D.   On: 07/04/2023 13:11   DG Chest Portable 1 View Result  Date: 07/03/2023 CLINICAL DATA:  Dyspnea EXAM: PORTABLE CHEST 1 VIEW COMPARISON:  06/08/2023 FINDINGS: The heart size and mediastinal contours are within normal limits. Slightly increased interstitial markings bilaterally. Band-like atelectasis at the right lung base. No pleural effusion or pneumothorax. The visualized skeletal structures are unremarkable. IMPRESSION: Slightly increased interstitial markings bilaterally, which may reflect bronchitic lung changes versus mild edema or developing atypical/viral infection. Electronically Signed   By: Mabel Converse D.O.   On: 07/03/2023 18:07    Pending Labs Unresulted Labs (From admission, onward)     Start     Ordered   07/03/23 1733  Urinalysis, w/ Reflex to Culture (Infection Suspected) -Urine, Clean Catch  Once,   URGENT       Question:  Specimen Source  Answer:  Urine, Clean Catch   07/03/23 1732            Vitals/Pain Today's Vitals   07/04/23 0936 07/04/23 1000 07/04/23 1055 07/04/23 1156  BP:  104/71 110/77 115/70  Pulse:  78    Resp:  11    Temp:      TempSrc:      SpO2:  100%    PainSc: 7        Isolation Precautions No active isolations  Medications Medications  0.9 %  sodium chloride  infusion ( Intravenous New Bag/Given 07/04/23 0656)  midodrine  (PROAMATINE ) tablet 5 mg (5 mg Oral Given 07/04/23 0917)  spironolactone  (ALDACTONE ) tablet 200 mg (200 mg Oral Given 07/04/23 0917)  torsemide  (DEMADEX ) tablet 40 mg (40 mg Oral Given 07/04/23 0918)  amitriptyline  (ELAVIL ) tablet 10 mg (10 mg Oral Given 07/03/23 2339)  lactulose  (CHRONULAC ) 10 GM/15ML solution 30 g (30 g Oral Given 07/04/23 0918)  pantoprazole  (PROTONIX ) EC tablet 40 mg (40 mg Oral Given 07/04/23 0917)  acetaminophen  (TYLENOL ) tablet 650 mg (has no administration in time range)    Or  acetaminophen  (TYLENOL ) suppository 650 mg (has no administration in time range)  ondansetron  (ZOFRAN ) tablet 4 mg (4 mg Oral Given 07/04/23 0936)    Or  ondansetron  (ZOFRAN )  injection 4 mg ( Intravenous See Alternative 07/04/23 0936)  rifaximin  (XIFAXAN ) tablet 550 mg (550 mg Oral Given 07/04/23 0917)  oxyCODONE  (Oxy IR/ROXICODONE ) immediate release tablet 5 mg (5 mg Oral Given 07/04/23 0936)  albumin  human 25 % solution 25 g (has no administration in time range)  fentaNYL  (SUBLIMAZE ) injection 50 mcg (50 mcg Intravenous Given 07/03/23 1717)  albumin  human 25 % solution 25 g (0 g Intravenous Stopped 07/03/23 2037)  lidocaine  (PF) (XYLOCAINE ) 1 % injection 10 mL (10 mLs Other Given 07/03/23 1945)  lidocaine  (XYLOCAINE ) 1 % (with pres) injection 20 mL (15 mLs Intradermal Given 07/04/23 1100)    Mobility walks     Focused Assessments Cardiac Assessment Handoff:    No results found for: CKTOTAL, CKMB, CKMBINDEX, TROPONINI No results found for: DDIMER Does the Patient currently have chest pain? No    R Recommendations: See Admitting Provider Note  Report given to:   Additional Notes:

## 2023-07-04 NOTE — Consult Note (Addendum)
 Consultation Note   Referring Provider:  [facility] PCP: Buck Search, PA-C Primary Gastroenterologist: Glendia Holt, MD Followed by [facility]        Reason for Consultation: Decompensated cirrhosis  DOA: 07/03/2023         Hospital Day: 2   ASSESSMENT    Brief Narrative:  53 y.o. year old male with obesity, Etoh abuse, decompensated cirrhosis,  chronic loose stool, cholecystectomy, multiple polyps including an advanced polyp(s), colon AMV, NSAID induced duodenal ulcers, severe esophagitis, hiatal hernia  Decompensated MASH cirrhosis s/p TIPS. Followed by [facility]. Still requiring paracentesis despite TIPS. Admitted with abdominal pain / SHOB and large volume ascites with possible TIPS occlusion   Generalized abdominal pain, possibly multifactorial ( urinary retention, constipation, ascites). Ascitic fluid cell count yesterday  negative for SBP  AKI, etiology? Also with urinary retention.  Obstructive process? HRS?  Baseline Cr 0.9 Cr 1.79 >> 1.92  GERD  Principal Problem:   Decompensated cirrhosis (HCC)    PLAN:   --Awaiting CT Venogram to confirm TIPS occlusion --2 gram sodium diet --Continue Xifaxan  --Continue Lactulose  3-4 times daily --Getting 200 mg of Aldactone  daily  / Demadex  40 mg daily. He has AKI so may be best to hold those for now.  --Continue Pantoprazole   HPI   Patient was diagnosed with decompensated cirrhosis around Sept 2023. He was evaluated our office in Nov 2023 for positive FIT and cirrhosis  Cirrhosis possibly related to [facility] / MASH.  Other causes of chronic liver disease were exclude. 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 SABRA   Patient most recently followed by [facility] for transplant evaluation. He tells me that he is on the transplant list. He is s/p TIPS but has still required LVPs.SABRA He was admitted in  early December with hepatic encephalopathy in setting of constipation and anemia. We offered EGD  but he wanted to hold off.. He was complaining of odynophagia and we treated empirically with fluconazole .   Lynwood presented to ED 1/13 with abdominal pain and shortness of breath. He hasn't been urinating for a few days and also no BM in 2 days despite lactulose . He was found to have a large amount of urine in bladder on scan. Had paracentesis done in ED yesterday ( ? Volume). No SBP. He had 6 L removed today. The abdominal pain is generalized across his mid abdomen. The pain started on Friday and has been pretty constant. He feels a little better after the LVP. Also having foley placed now and relieving urinary retention. Tytus has been compliant with lactulose  which he takes about 3 times a day. He ran out of one of his prescription, thinks it was one of the diuretics and he noticed an immediate retention of fluid.   Notable ED labs / Imaging    WBC 6.6 Hgb 9.8 Platelets 123 INR 1.4 Na 121 Cr 1.79 >> 1.92 Tbili 3.6 AST 66, ALT 48  US  with liver doppler - occluded TIPS.    Previous GI Evaluations   Colonoscopy Jun 17, 2022 (Positive FIT test) Impression Extremely difficult colonoscopy due to excessive looping, inadequate prep and patient intolerance (coughing, retching): cecum not visualized - Preparation of  the colon was fair.  - Six 3 to 8 mm polyps in the ascending colon, removed with a cold snare. Resected and retrieved.  - Five 3 to 7 mm polyps in the transverse colon, removed with a cold snare. Resected and retrieved.  - One 10 mm polyp in the sigmoid colon, removed with a cold snare. Resected and retrieved.  - One 8 mm polyp in the rectum, removed with a cold snare. Resected and retrieved. Clip (MR conditional) was placed.  - A few non-bleeding colonic angioectasias.  - Non-bleeding hemorrhoids.   EGD Jun 17, 2022 (Variceal screening, nausea/vomiting) Impression - The examined  portions of the nasopharynx, oropharynx and larynx were normal.  - LA Grade D reflux esophagitis with no bleeding.  - Portal hypertensive gastropathy. Biopsied.  - 6 cm hiatal hernia.  - Partially obstructing non-bleeding severe duodenal ulceration. Biopsied.  - Erythematous duodenopathy   1. Surgical [P], duodenal ulcers - DUODENAL MUCOSA WITH REACTIVE/REGENERATIVE TYPE CHANGES CONSISTENT WITH ADJACENT ULCER - NEGATIVE FOR MALIGNANCY - SEE NOTE 2. Surgical [P], duodenal - DUODENAL MUCOSA WITH PROMINENT BRUNNER'S GLANDS, FOCAL FOVEOLAR METAPLASIA AND REACTIVE/REGENERATIVE CHANGES CONSISTENT WITH CHRONIC PEPTIC DUODENITIS - NEGATIVE FOR MALIGNANCY - SEE NOTE 3. Surgical [P], gastric - ANTRAL AND OXYNTIC MUCOSA WITH FOCAL, SLIGHT CHRONIC INFLAMMATION - OXYNTIC MUCOSA WITH EARLY PROTON PUMP INHIBITOR TYPE EFFECT/EARLY FUNDIC GLAND POLYP LIKE CHANGES - IMMUNOHISTOCHEMICAL STAIN FOR HELICOBACTER ORGANISMS IS NEGATIVE 4. Surgical [P], colon, ascending -6, transverse - 5, polyp (11) - TUBULAR ADENOMA(S) (MULTIPLE FRAGMENTS) - NEGATIVE FOR HIGH-GRADE DYSPLASIA OR MALIGNANCY - SEE NOTE 5. Surgical [P], colon, sigmoid and rectal, polyp (2) - TUBULOVILLOUS ADENOMA (1 FRAGMENT) - TUBULAR ADENOMA (1 FRAGMENT) - NEGATIVE FOR HIGH-GRADE DYSPLASIA OR MALIGNANCY      Colonoscopy Aug 25, 2022 (incomplete colonoscopy, surveillance >10 adenomas Impression - Three 3 to 6 mm polyps in the ascending colon, removed with a cold snare. Resected and retrieved.  - One 2 mm polyp in the ascending colon, removed with a jumbo cold forceps. Resected and retrieved.  - One 6 mm polyp in the transverse colon, removed with a cold snare. Resected and retrieved.  - One 3 mm polyp in the descending colon, removed with a cold snare. Resected and retrieved.  - One 5 mm polyp in the sigmoid colon, removed with a cold snare. Resected and retrieved.  - Hemoclip from previous polypectomy in the rectum with mucosa suspicious  for remnantadenoma, biopsied.  - A single colonic angiodysplastic lesion. Treated with argon plasma coagulation (APC).  - The distal rectum and anal verge are normal on retroflexion view.  - Not all of the cecum was visualized and the appendiceal orifice was never definitively identified. The patient's refractory ascites is likely contributing to the technical difficulty of the procedure.   EGD Aug 25, 2022 Impression - The examined portions of the nasopharynx, oropharynx and larynx were normal. - Z-line irregular. The previously noted esophagitis has healed.  - Grade I esophageal varices. These do no require banding or beta blocker prophylaxis  - 5 cm hiatal hernia.  - Portal hypertensive gastropathy. Biopsied.  - Focal granular mucosa in the second portion of the duodenum. Likely healing inflammation. Biopsied to exclude dysplasia  - Duodenal scarring from previous ulceration.   Path A. DUODENUM, INFLAMED MUCOSA, BIOPSY: - Duodenal mucosa with reactive histopathologic changes - Negative for increased intraepithelial lymphocytes or villous architectural changes  B. STOMACH, BIOPSY: - Gastric oxyntic mucosa with parietal cell hyperplasia as can be seen in hypergastrinemic  states such as PPI therapy. - Helicobacter pylori-like organisms are not identified on routine HE stain  C. COLON, ASCENDING, TRANSVERSE, POLYPECTOMY: - Tubular adenoma(s) - Negative for high-grade dysplasia or malignancy  D. COLON, DESCENDING, SIGMOID, POLYPECTOMY: - Tubular adenoma(s) - Negative for high-grade dysplasia or malignancy  E. RECTAL POLYPECTOMY SITE, BIOPSY: - Polypoid rectal mucosa with mild nonspecific hyperplastic changes - Negative for dysplasia or malignancy    Labs and Imaging: Recent Labs    07/03/23 1354 07/04/23 0551  WBC 6.6 6.1  HGB 9.8* 9.1*  HCT 28.1* 25.2*  PLT 123* 100*   Recent Labs    07/03/23 1354 07/04/23 0551  NA 120* 121*  K 4.6 4.7  CL 90* 92*  CO2 19* 20*   GLUCOSE 104* 98  BUN 17 19  CREATININE 1.79* 1.92*  CALCIUM  8.7* 9.0   Recent Labs    07/04/23 0551  PROT 5.8*  ALBUMIN  3.0*  AST 66*  ALT 48*  ALKPHOS 108  BILITOT 3.6*   No results for input(s): HEPBSAG, HCVAB, HEPAIGM, HEPBIGM in the last 72 hours. Recent Labs    07/03/23 1354  LABPROT 17.4*  INR 1.4*      Past Medical History:  Diagnosis Date   Anxiety    Arthritis    Cellulitis 12/18/2012   left foot   Cirrhosis of liver (HCC)    Depression    GERD (gastroesophageal reflux disease)    Hypertension    no longer a problem   Obesity    Varicose veins     Past Surgical History:  Procedure Laterality Date   BIOPSY  08/25/2022   Procedure: BIOPSY;  Surgeon: Stacia Glendia BRAVO, MD;  Location: [facility];  Service: Gastroenterology;;   CHOLECYSTECTOMY     COLONOSCOPY WITH PROPOFOL  N/A 08/25/2022   Procedure: COLONOSCOPY WITH PROPOFOL ;  Surgeon: Stacia Glendia BRAVO, MD;  Location: [facility];  Service: Gastroenterology;  Laterality: N/A;   ENDOVENOUS ABLATION SAPHENOUS VEIN W/ LASER Left 09-30-2013   left greater saphenous vein   ESOPHAGOGASTRODUODENOSCOPY (EGD) WITH PROPOFOL  N/A 08/25/2022   Procedure: ESOPHAGOGASTRODUODENOSCOPY (EGD) WITH PROPOFOL ;  Surgeon: Stacia Glendia BRAVO, MD;  Location: [facility];  Service: Gastroenterology;  Laterality: N/A;   HOT HEMOSTASIS N/A 08/25/2022   Procedure: HOT HEMOSTASIS (ARGON PLASMA COAGULATION/BICAP);  Surgeon: Stacia Glendia BRAVO, MD;  Location: [facility];  Service: Gastroenterology;  Laterality: N/A;   IR IVUS EACH ADDITIONAL NON CORONARY VESSEL  07/20/2022   IR PARACENTESIS  05/03/2022   IR PARACENTESIS  05/18/2022   IR PARACENTESIS  06/02/2022   IR PARACENTESIS  06/10/2022   IR PARACENTESIS  06/24/2022   IR PARACENTESIS  06/30/2022   IR PARACENTESIS  07/08/2022   IR PARACENTESIS  07/15/2022   IR PARACENTESIS  07/20/2022   IR PARACENTESIS  07/29/2022   IR PARACENTESIS  08/12/2022   IR PARACENTESIS  08/19/2022    IR PARACENTESIS  09/06/2022   IR PARACENTESIS  09/22/2022   IR PARACENTESIS  10/21/2022   IR PARACENTESIS  11/17/2022   IR PARACENTESIS  12/07/2022   IR PARACENTESIS  12/21/2022   IR PARACENTESIS  01/06/2023   IR PARACENTESIS  01/23/2023   IR PARACENTESIS  02/23/2023   IR PARACENTESIS  03/09/2023   IR PARACENTESIS  03/31/2023   IR PARACENTESIS  05/01/2023   IR PARACENTESIS  05/16/2023   IR PARACENTESIS  06/19/2023   IR PARACENTESIS  07/04/2023   IR TIPS  07/20/2022   IR US  GUIDE VASC ACCESS RIGHT  07/20/2022   IR US   GUIDE VASC ACCESS RIGHT  07/20/2022   POLYPECTOMY  08/25/2022   Procedure: POLYPECTOMY;  Surgeon: Stacia Glendia BRAVO, MD;  Location: [facility];  Service: Gastroenterology;;   RADIOLOGY WITH ANESTHESIA N/A 07/20/2022   Procedure: TIPS;  Surgeon: Philip Cornet, MD;  Location: [facility];  Service: Radiology;  Laterality: N/A;    Family History  Problem Relation Age of Onset   Varicose Veins Mother    Heart disease Mother    Diabetes Mother    Arthritis Mother    Hypertension Mother    Stroke Father    Heart disease Father    Colon cancer Neg Hx    Stomach cancer Neg Hx    Rectal cancer Neg Hx    Esophageal cancer Neg Hx     Prior to Admission medications   Medication Sig Start Date End Date Taking? Authorizing Provider  albuterol  (VENTOLIN  HFA) 108 (90 Base) MCG/ACT inhaler Inhale 2 puffs into the lungs every 6 (six) hours as needed for shortness of breath 04/11/22  Yes   amitriptyline  (ELAVIL ) 10 MG tablet Take 1 tablet (10 mg total) by mouth at bedtime. 09/15/22  Yes   ammonium lactate  (LAC-HYDRIN ) 12 % lotion Apply topically as needed for dry skin. Twice a day 03/02/23  Yes   Baclofen  5 MG TABS Take 2 tablets (10 mg total) by mouth 3 (three) times daily. 02/02/23  Yes Drazek, Dawn, CRNP  bismuth subsalicylate (PEPTO BISMOL) 262 MG/15ML suspension Take 30 mLs by mouth every 6 (six) hours as needed for indigestion.   Yes [provider]  clobetasol  ointment (TEMOVATE ) 0.05 %  Apply topically 2 (two) times a day. To affected area (right hand) 03/02/23  Yes   diphenhydrAMINE  (BENADRYL ) 50 MG tablet Take 50 mg by mouth every 8 (eight) hours as needed for itching or allergies.   Yes [provider]  docusate sodium  (COLACE) 100 MG capsule Take 200 mg by mouth daily as needed (constipation.).   Yes [provider]  lactulose  (CHRONULAC ) 10 GM/15ML solution Take 3.33 g by mouth 3 (three) times daily.   Yes [provider]  magnesium  oxide (MAG-OX) 400 (240 Mg) MG tablet Take 400 mg by mouth daily.   Yes [provider]  midodrine  (PROAMATINE ) 5 MG tablet Take 1 tablet (5 mg total) by mouth 3 (three) times daily with meals. 06/11/23 07/11/23 Yes Pahwani, Fredia, MD  ondansetron  (ZOFRAN -ODT) 8 MG disintegrating tablet Take 1 tablet (8 mg total) by mouth 3 (three) times daily as needed for nausea. 06/07/22  Yes   pantoprazole  (PROTONIX ) 40 MG tablet Take 1 tablet (40 mg total) by mouth 2 (two) times daily. 06/29/23  Yes Stacia Glendia BRAVO, MD  potassium chloride  SA (KLOR-CON  M) 20 MEQ tablet Take 2 tablets (40 mEq total) by mouth daily. 09/26/22  Yes Stacia Glendia BRAVO, MD  silver  sulfADIAZINE  (SILVADENE ) 1 % cream Apply topically 2 (two) times a day. To affected areas Patient taking differently: Apply 1 Application topically 2 (two) times daily as needed (for burn, itch). 03/02/23  Yes   spironolactone  (ALDACTONE ) 100 MG tablet Take 2 tablets (200 mg total) by mouth daily. 05/24/23  Yes Raenelle Coria, MD  sucralfate  (CARAFATE ) 1 g tablet Take 1 tablet (1 g total) by mouth 4 (four) times daily -  with meals and at bedtime. Please dissolve the tablet in a tablespoon of water and drink the liquid 4 times a day 06/06/23  Yes Stacia Glendia BRAVO, MD  torsemide  (DEMADEX ) 20 MG tablet Take  2 tablets (40 mg total) by mouth daily. 05/25/23 07/03/23 Yes Raenelle Coria, MD    Current Facility-Administered Medications  Medication Dose Route Frequency Provider Last  Rate Last Admin   0.9 %  sodium chloride  infusion   Intravenous Continuous Dena Charleston, MD 75 mL/hr at 07/04/23 0656 New Bag at 07/04/23 0656   acetaminophen  (TYLENOL ) tablet 650 mg  650 mg Oral Q6H PRN Dena Charleston, MD       Or   acetaminophen  (TYLENOL ) suppository 650 mg  650 mg Rectal Q6H PRN Dena Charleston, MD       amitriptyline  (ELAVIL ) tablet 10 mg  10 mg Oral QHS Dena Charleston, MD   10 mg at 07/03/23 2339   lactulose  (CHRONULAC ) 10 GM/15ML solution 30 g  30 g Oral QID Swayze, Ava, DO       midodrine  (PROAMATINE ) tablet 5 mg  5 mg Oral TID WC Dorrell, Robert, MD   5 mg at 07/04/23 1359   ondansetron  (ZOFRAN ) tablet 4 mg  4 mg Oral Q6H PRN Dena Charleston, MD   4 mg at 07/04/23 9063   Or   ondansetron  (ZOFRAN ) injection 4 mg  4 mg Intravenous Q6H PRN Dena Charleston, MD       oxyCODONE  (Oxy IR/ROXICODONE ) immediate release tablet 5 mg  5 mg Oral Q6H PRN Swayze, Ava, DO   5 mg at 07/04/23 0936   pantoprazole  (PROTONIX ) EC tablet 40 mg  40 mg Oral BID Dorrell, Robert, MD   40 mg at 07/04/23 9082   rifaximin  (XIFAXAN ) tablet 550 mg  550 mg Oral BID Dena Charleston, MD   550 mg at 07/04/23 9082   spironolactone  (ALDACTONE ) tablet 200 mg  200 mg Oral Daily Dorrell, Robert, MD   200 mg at 07/04/23 9082   torsemide  (DEMADEX ) tablet 40 mg  40 mg Oral Daily Dorrell, Robert, MD   40 mg at 07/04/23 9081   Current Outpatient Medications  Medication Sig Dispense Refill   albuterol  (VENTOLIN  HFA) 108 (90 Base) MCG/ACT inhaler Inhale 2 puffs into the lungs every 6 (six) hours as needed for shortness of breath 6.7 g 2   amitriptyline  (ELAVIL ) 10 MG tablet Take 1 tablet (10 mg total) by mouth at bedtime. 30 tablet 1   ammonium lactate  (LAC-HYDRIN ) 12 % lotion Apply topically as needed for dry skin. Twice a day 225 g 11   Baclofen  5 MG TABS Take 2 tablets (10 mg total) by mouth 3 (three) times daily. 180 tablet PRN   bismuth subsalicylate (PEPTO BISMOL) 262 MG/15ML suspension Take 30 mLs by  mouth every 6 (six) hours as needed for indigestion.     clobetasol  ointment (TEMOVATE ) 0.05 % Apply topically 2 (two) times a day. To affected area (right hand) 60 g 1   diphenhydrAMINE  (BENADRYL ) 50 MG tablet Take 50 mg by mouth every 8 (eight) hours as needed for itching or allergies.     docusate sodium  (COLACE) 100 MG capsule Take 200 mg by mouth daily as needed (constipation.).     lactulose  (CHRONULAC ) 10 GM/15ML solution Take 3.33 g by mouth 3 (three) times daily.     magnesium  oxide (MAG-OX) 400 (240 Mg) MG tablet Take 400 mg by mouth daily.     midodrine  (PROAMATINE ) 5 MG tablet Take 1 tablet (5 mg total) by mouth 3 (three) times daily with meals. 90 tablet 0   ondansetron  (ZOFRAN -ODT) 8 MG disintegrating tablet Take 1 tablet (8 mg total) by mouth 3 (three) times daily as needed for  nausea. 90 tablet 0   pantoprazole  (PROTONIX ) 40 MG tablet Take 1 tablet (40 mg total) by mouth 2 (two) times daily. 180 tablet 3   potassium chloride  SA (KLOR-CON  M) 20 MEQ tablet Take 2 tablets (40 mEq total) by mouth daily. 180 tablet 3   silver  sulfADIAZINE  (SILVADENE ) 1 % cream Apply topically 2 (two) times a day. To affected areas (Patient taking differently: Apply 1 Application topically 2 (two) times daily as needed (for burn, itch).) 50 g 1   spironolactone  (ALDACTONE ) 100 MG tablet Take 2 tablets (200 mg total) by mouth daily. 360 tablet 3   sucralfate  (CARAFATE ) 1 g tablet Take 1 tablet (1 g total) by mouth 4 (four) times daily -  with meals and at bedtime. Please dissolve the tablet in a tablespoon of water and drink the liquid 4 times a day 120 tablet 0   torsemide  (DEMADEX ) 20 MG tablet Take 2 tablets (40 mg total) by mouth daily. 60 tablet 0    Allergies as of 07/03/2023 - Review Complete 07/03/2023  Allergen Reaction Noted   Tylenol  [acetaminophen ] Other (See Comments)    Dust mite extract  02/02/2023   Paroxetine  Other (See Comments) 04/11/2022   Pollen extract  02/02/2023   Tramadol   Nausea Only 03/30/2022    Social History   Socioeconomic History   Marital status: Single    Spouse name: Not on file   Number of children: Not on file   Years of education: Not on file   Highest education level: Not on file  Occupational History   Not on file  Tobacco Use   Smoking status: Former    Current packs/day: 0.00    Types: Cigarettes    Quit date: 09/18/2012    Years since quitting: 10.7   Smokeless tobacco: Never  Vaping Use   Vaping status: Never Used  Substance and Sexual Activity   Alcohol use: No   Drug use: No   Sexual activity: Not on file  Other Topics Concern   Not on file  Social History Narrative   Not on file   Social Drivers of Health   Financial Resource Strain: Not on File (10/07/2021)   Received from [employer], [employer]    Financial Resource Strain: 0  Food Insecurity: No Food Insecurity (07/04/2023)   Hunger Vital Sign    Worried About Running Out of Food in the Last Year: Never true    Ran Out of Food in the Last Year: Never true  Transportation Needs: Patient Unable To Answer (07/04/2023)   PRAPARE - Transportation    Lack of Transportation (Medical): Patient unable to answer    Lack of Transportation (Non-Medical): Patient unable to answer  Physical Activity: Not on File (10/07/2021)   Received from [facility], [place]   Physical Activity    Physical Activity: 0  Stress: Not on File (10/07/2021)   Received from [facility], [place]   Stress    Stress: 0  Social Connections: Not on File (03/02/2023)   Received from [facility]   Social Connections    Connectedness: 0  Intimate Partner Violence: Patient Declined (07/04/2023)   Humiliation, Afraid, Rape, and Kick questionnaire    Fear of Current or Ex-Partner: Patient declined    Emotionally Abused: Patient declined    Physically Abused: Patient declined    Sexually Abused: Patient declined     Code Status   Code Status: Full Code  Review of Systems: All systems reviewed and  negative except  where noted in HPI.  Physical Exam: Vital signs in last 24 hours: Temp:  [97.5 F (36.4 C)-97.7 F (36.5 C)] 97.7 F (36.5 C) (01/14 1436) Pulse Rate:  [69-114] 80 (01/14 1410) Resp:  [8-18] 12 (01/14 1410) BP: (88-133)/(50-83) 100/61 (01/14 1410) SpO2:  [99 %-100 %] 100 % (01/14 1410) Weight:  [881 kg] 118 kg (01/14 1342)    General:  Pleasant male in NAD Psych:  Cooperative. Normal mood and affect Eyes: Pupils equal Ears:  Normal auditory acuity Nose: No deformity, discharge or lesions Neck:  Supple, no masses felt Lungs:  Clear to auscultation.  Heart:  Regular rate, regular rhythm.  Abdomen:  Soft, mildly distended, nontender, active bowel sounds, no masses felt Rectal :  Deferred Msk: Symmetrical without gross deformities.  Neurologic:  Alert, oriented, grossly normal neurologically. No asterixis Extremities : 1-2 + BLE edema.  Skin:  Intact without significant lesions.    Intake/Output from previous day: No intake/output data recorded. Intake/Output this shift:  No intake/output data recorded.   Vina Dasen, NP-C   07/04/2023, 2:45 PM

## 2023-07-04 NOTE — Progress Notes (Signed)
   07/04/23 1635  Spiritual Encounters  Type of Visit Attempt (pt unavailable)  Reason for visit Advance directives  OnCall Visit No  Advance Directives (For Healthcare)  Does Patient Have a Medical Advance Directive? No  Would patient like information on creating a medical advance directive? Yes (Inpatient - patient defers creating a medical advance directive and declines information at this time)   01/14 attempted visit patient not available, asked chaplain to return

## 2023-07-04 NOTE — ED Notes (Signed)
 Pt is asking for pain medicine. MD notified. Pt refuses tylenol due to his liver cirrhosis hx. Waiting for new orders.

## 2023-07-04 NOTE — Progress Notes (Signed)
 PROGRESS NOTE  Erik Howe FMW:[id number] DOB: [birth year] DOA: 07/03/2023 PCP: Buck Search, PA-C  Brief History   The patient is a 53 yr old man who presented to [facility] on 07/03/2023 with complaints of progressively worsening shortness of breath and abdominal distention. 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 Since that procedure the patient has had multiple paracentesis ( about q2 weeks), and he has been admitted for hepatic encephalopathy. On admission the patient's sodium was 120, His creatinine was 1.8 (baseline 1.0) and his T bili was 3.6. AST and ALT were somewhat elevated at 75 and 50. INR was 1.4 and lactic acid  was 2.2  There was a diagnostic paracentesis performed in the ED. 3100 cc Fluid was removed that was serosanguinous and cloudy. Culture has had no growth. Gram stain demonstrated no WBC and no organisms were seen. Nucleated cells were 78. Protein was low and Glucose was normal. No evidence of infection.  He was given albumin  with that paracentesis.   AKI and hyponatremia were due to intravascular volume depletion.   The patient was admitted to a medical bed with telemetry. Today his creatinine increased to 2.15. Earlier today the patient was taken for large volume paracentesis. 6.1 L were removed. The patent was given 25 g albumin .   The patient underwent a CT Venogram of the abdomen to evaluate the patency of the TIPS. It was occluded. Plans are to have the TIPS revised on 07/05/2023.   Sister is at bedside.  A & P  Decompensated cirrhosis complicated by refractory ascites - Patient had paracentesis 2 weeks ago with significant abdominal distention - Shortness of breath - Paracentesis in the emergency department without calculated cell count - Previous TIPS appears to be completely occluded accounting for the rapid  re-accumulation of fluid after his recent paracentesis. Plan is for him to go to IR for revision on 07/05/2023. -- Torsemide  and spironolactone  have been held. --Ceftriaxone  for SBP prophylaxis -- The patient is somnolent    AKI --Ddx 1) Intravascular volume depletion due to paracentesis, 2) Hepatorenal syndrome 3)diuresis --Albumin  given --Spironolactone  and lasix  have been held.  Insomnia-continue Elavil  --The patient likely has her days and nights reversed. He is somnolent. Do not feel that this is due to hepatic encephalopathy as the patient is completely alert and oriented when he is awakened. Not difficult to awaken. Monitor.   GERD-continue Protonix      Admission status: Inpatient Telemetry Medical  I have seen and examined this patient myself. I have spent 34 minutes in her evaluation and care.  DVT prophylaxis: SCD's Code Status: Full Code Family Communication: Sister is at bedside Disposition Plan: tbd    Keaton Beichner, DO [facility] Direct contact: see [email or web address]  7PM-7AM contact night coverage as above 07/04/2023, 7:04 PM  LOS: 0 days   Consultants  Gastroenterology  Procedures  Paracentesis x 2 Todal of 9.2 liters removed.  Antibiotics  Ceftriaxone  1 gm Q 24 hours  Interval History/Subjective  The patient is somnolent. He states that he hasn't been able to sleep for a few days. When awake he is completely awake and alert. He has no new complaints.  Objective   Vitals:  Vitals:   07/04/23 1436 07/04/23 1502  BP:  110/66  Pulse:  74  Resp:  18  Temp: 97.7 F (36.5 C) 98.2 F (36.8 C)  SpO2:  100%  Exam:  Exam:  Constitutional:  The patient is awake, alert, and oriented x 3. No acute distress. Eyes:  pupils and irises appear normal Normal lids and conjunctivae Sclera are jaundiced Respiratory:  No increased work of breathing. No wheezes, rales, or rhonchi No tactile fremitus Cardiovascular:  Regular rate and rhythm No  murmurs, ectopy, or gallups. No lateral PMI. No thrills. Abdomen:  Abdomen is soft, non-tender, distended No hernias, masses, or organomegaly Normoactive bowel sounds.  Musculoskeletal:  No cyanosis, clubbing, or edema Skin:  No rashes, lesions, ulcers palpation of skin: no induration or nodules Jaundiced Neurologic:  CN 2-12 intact Sensation all 4 extremities intact Psychiatric:  Mental status Mood, affect appropriate Orientation to person, place, time  judgment and insight appear intact   I have personally reviewed the following:   Today's Data  Vitals  Lab Data  CBC CMP  Micro Data   Results for orders placed or performed during the hospital encounter of 07/03/23  Body fluid culture w Gram Stain     Status: None (Preliminary result)   Collection Time: 07/03/23  4:39 PM   Specimen: Peritoneal Cavity; Peritoneal Fluid  Result Value Ref Range Status   Specimen Description PERITONEAL CAVITY  Final   Special Requests NONE  Final   Gram Stain NO WBC SEEN NO ORGANISMS SEEN   Final   Culture   Final    NO GROWTH 2 DAYS Performed at [facility], [address], [place], [place] [address]    Report Status PENDING  Incomplete     Imaging  CT venogram of the abdomen  Scheduled Meds:  amitriptyline   10 mg Oral QHS   [START ON 07/05/2023] feeding supplement  237 mL Oral BID BM   lactulose   30 g Oral QID   midodrine   5 mg Oral TID WC   pantoprazole   40 mg Oral BID   rifaximin   550 mg Oral BID    Principal Problem:   Decompensated cirrhosis (HCC)   LOS: 0 days

## 2023-07-05 ENCOUNTER — Telehealth (HOSPITAL_COMMUNITY): Payer: Self-pay | Admitting: Pharmacy Technician

## 2023-07-05 ENCOUNTER — Other Ambulatory Visit (HOSPITAL_COMMUNITY): Payer: Self-pay

## 2023-07-05 DIAGNOSIS — N179 Acute kidney failure, unspecified: Secondary | ICD-10-CM | POA: Diagnosis not present

## 2023-07-05 DIAGNOSIS — E871 Hypo-osmolality and hyponatremia: Secondary | ICD-10-CM | POA: Diagnosis not present

## 2023-07-05 DIAGNOSIS — K7460 Unspecified cirrhosis of liver: Secondary | ICD-10-CM | POA: Diagnosis not present

## 2023-07-05 DIAGNOSIS — K7290 Hepatic failure, unspecified without coma: Secondary | ICD-10-CM | POA: Diagnosis not present

## 2023-07-05 LAB — CBC WITH DIFFERENTIAL/PLATELET
Abs Immature Granulocytes: 0.03 10*3/uL (ref 0.00–0.07)
Basophils Absolute: 0 10*3/uL (ref 0.0–0.1)
Basophils Relative: 0 %
Eosinophils Absolute: 0.1 10*3/uL (ref 0.0–0.5)
Eosinophils Relative: 1 %
HCT: 26.9 % — ABNORMAL LOW (ref 39.0–52.0)
Hemoglobin: 9.5 g/dL — ABNORMAL LOW (ref 13.0–17.0)
Immature Granulocytes: 0 %
Lymphocytes Relative: 9 %
Lymphs Abs: 0.7 10*3/uL (ref 0.7–4.0)
MCH: 31.4 pg (ref 26.0–34.0)
MCHC: 35.3 g/dL (ref 30.0–36.0)
MCV: 88.8 fL (ref 80.0–100.0)
Monocytes Absolute: 1 10*3/uL (ref 0.1–1.0)
Monocytes Relative: 12 %
Neutro Abs: 6.5 10*3/uL (ref 1.7–7.7)
Neutrophils Relative %: 78 %
Platelets: 107 10*3/uL — ABNORMAL LOW (ref 150–400)
RBC: 3.03 MIL/uL — ABNORMAL LOW (ref 4.22–5.81)
RDW: 15 % (ref 11.5–15.5)
WBC: 8.3 10*3/uL (ref 4.0–10.5)
nRBC: 0 % (ref 0.0–0.2)

## 2023-07-05 LAB — COMPREHENSIVE METABOLIC PANEL
ALT: 46 U/L — ABNORMAL HIGH (ref 0–44)
AST: 63 U/L — ABNORMAL HIGH (ref 15–41)
Albumin: 2.9 g/dL — ABNORMAL LOW (ref 3.5–5.0)
Alkaline Phosphatase: 115 U/L (ref 38–126)
Anion gap: 10 (ref 5–15)
BUN: 22 mg/dL — ABNORMAL HIGH (ref 6–20)
CO2: 19 mmol/L — ABNORMAL LOW (ref 22–32)
Calcium: 8.8 mg/dL — ABNORMAL LOW (ref 8.9–10.3)
Chloride: 93 mmol/L — ABNORMAL LOW (ref 98–111)
Creatinine, Ser: 2.32 mg/dL — ABNORMAL HIGH (ref 0.61–1.24)
GFR, Estimated: 33 mL/min — ABNORMAL LOW (ref >60)
Glucose, Bld: 93 mg/dL (ref 70–99)
Potassium: 5 mmol/L (ref 3.5–5.1)
Sodium: 122 mmol/L — ABNORMAL LOW (ref 135–145)
Total Bilirubin: 2.2 mg/dL — ABNORMAL HIGH (ref 0.0–1.2)
Total Protein: 5.8 g/dL — ABNORMAL LOW (ref 6.5–8.1)

## 2023-07-05 LAB — BASIC METABOLIC PANEL
Anion gap: 10 (ref 5–15)
BUN: 19 mg/dL (ref 6–20)
CO2: 19 mmol/L — ABNORMAL LOW (ref 22–32)
Calcium: 8.9 mg/dL (ref 8.9–10.3)
Chloride: 93 mmol/L — ABNORMAL LOW (ref 98–111)
Creatinine, Ser: 2.15 mg/dL — ABNORMAL HIGH (ref 0.61–1.24)
GFR, Estimated: 36 mL/min — ABNORMAL LOW (ref >60)
Glucose, Bld: 96 mg/dL (ref 70–99)
Potassium: 4.9 mmol/L (ref 3.5–5.1)
Sodium: 122 mmol/L — ABNORMAL LOW (ref 135–145)

## 2023-07-05 LAB — SODIUM, URINE, RANDOM: Sodium, Ur: <10 mmol/L

## 2023-07-05 LAB — OSMOLALITY, URINE: Osmolality, Ur: 313 mosm/kg (ref 300–900)

## 2023-07-05 LAB — PROTIME-INR
INR: 1.5 — ABNORMAL HIGH (ref 0.8–1.2)
Prothrombin Time: 18.3 s — ABNORMAL HIGH (ref 11.4–15.2)

## 2023-07-05 MED ORDER — LACTULOSE 10 GM/15ML PO SOLN
30.0000 g | Freq: Three times a day (TID) | ORAL | Status: DC
  Administered 2023-07-05 – 2023-07-06 (×3): 30 g via ORAL
  Filled 2023-07-05 (×3): qty 45

## 2023-07-05 MED ORDER — ALBUMIN HUMAN 25 % IV SOLN
50.0000 g | Freq: Once | INTRAVENOUS | Status: AC
  Administered 2023-07-05: 50 g via INTRAVENOUS
  Filled 2023-07-05: qty 200

## 2023-07-05 MED ORDER — ALBUMIN HUMAN 25 % IV SOLN
25.0000 g | Freq: Four times a day (QID) | INTRAVENOUS | Status: AC
  Administered 2023-07-05 – 2023-07-06 (×3): 25 g via INTRAVENOUS
  Filled 2023-07-05 (×3): qty 100

## 2023-07-05 MED ORDER — SODIUM CHLORIDE 0.9 % IV SOLN
50.0000 ug/h | INTRAVENOUS | Status: DC
  Administered 2023-07-05 – 2023-07-10 (×12): 50 ug/h via INTRAVENOUS
  Filled 2023-07-05 (×15): qty 1

## 2023-07-05 MED ORDER — SODIUM CHLORIDE 0.9 % IV SOLN: 2.0000 g | INTRAVENOUS | Status: AC

## 2023-07-05 MED ORDER — CHLORHEXIDINE GLUCONATE CLOTH 2 % EX PADS
6.0000 | MEDICATED_PAD | Freq: Every day | CUTANEOUS | Status: DC
  Administered 2023-07-06 – 2023-07-10 (×5): 6 via TOPICAL

## 2023-07-05 MED ORDER — MIDODRINE HCL 5 MG PO TABS
10.0000 mg | ORAL_TABLET | Freq: Three times a day (TID) | ORAL | Status: DC
  Administered 2023-07-05 – 2023-07-10 (×16): 10 mg via ORAL
  Filled 2023-07-05 (×16): qty 2

## 2023-07-05 NOTE — Progress Notes (Signed)
 Mobility Specialist Progress Note:    07/05/23 1000  Mobility  Activity Transferred from bed to chair  Level of Assistance Minimal assist, patient does 75% or more  Assistive Device Front wheel walker  Distance Ambulated (ft) 4 ft  Activity Response Tolerated well  Mobility Referral Yes  Mobility visit 1 Mobility  Mobility Specialist Start Time (ACUTE ONLY) [id number]  Mobility Specialist Stop Time (ACUTE ONLY) 0931  Mobility Specialist Time Calculation (min) (ACUTE ONLY) 15 min   Pt received in bed and agreeable. Ambulation deferred d/t pt having pain meds recently and feeling "woozy". Able to stand and pivot to chair w/ minA. No complaints throughout. Pt left in chair with call bell and all needs met. Chair alarm on and family present.  D'Vante Nolon Baxter Mobility Specialist Please contact via Special educational needs teacher or Rehab office at [phone]

## 2023-07-05 NOTE — Telephone Encounter (Signed)
 Patient Product/process development scientist completed.    The patient is insured through [facility] MEDICAID.     Ran test claim for Xifaxan  550 mg and the current 28 day co-pay is $4.00.   This test claim was processed through [facility]- copay amounts may vary at other pharmacies due to pharmacy/plan contracts, or as the patient moves through the different stages of their insurance plan.     Morgan Arab, CPHT Pharmacy Technician III Certified Patient Advocate [facility] Patient Advocate Team Direct Number: [phone]  Fax: [phone]

## 2023-07-05 NOTE — Progress Notes (Signed)
 Daily Progress Note  DOA: 07/03/2023 Hospital Day: 3   Chief Complaint: Decompensated cirrhosis  ASSESSMENT    Brief Narrative:  Erik Howe is a 53 y.o. year old male with a history of Etoh abuse, decompensated cirrhosis,  chronic loose stool, cholecystectomy, multiple polyps including an advanced polyp(s), colon AMV, NSAID induced duodenal ulcers, severe esophagitis, hiatal hernia    Decompensated MASH cirrhosis s/p TIPS. Followed by [facility].  Admitted with abdominal pain / SHOB , large volume ascites, TIPS is occluded  MELD 3.0: 30 at 07/05/2023  9:54 AM.  No SBP  Abdominal distention. Not all due to ascites as abdominal exam suggests a lot of gaseous distention.   AKI, etiology not clear.   HRS?  Intravascular volume depletion?  Baseline Cr 0.9 Cr continues to rise,  up to 2.32 today. Diuretics held yesterday Getting dose of IV albumin  ( 50 grams) right now. Midodrine  started   GERD   Principal Problem:   Decompensated cirrhosis (HCC) Active Problems:   Hyponatremia   AKI (acute kidney injury) (HCC)   PLAN   --Occluded TIPs - IR planning on thrombectomy and TIPS revision today --2 gram sodium diet when diet resumed --Continue Xifaxan  --decrease lactulose  to TID due to gaseous distention.  --Diuretics on hold --Continue Midodrine  --Check urine sodium, depending on results may add Octreotide   --Continue Pantoprazole  --am labs  Subjective   Has had about 2 BMs so far today. Still has intermittent generalized mid abdominal pain.   Objective   Urine output 1575 yesterday  Recent Labs    07/03/23 1354 07/04/23 0551 07/05/23 0356  WBC 6.6 6.1 8.3  HGB 9.8* 9.1* 9.5*  HCT 28.1* 25.2* 26.9*  PLT 123* 100* 107*   BMET Recent Labs    07/04/23 1701 07/05/23 0356 07/05/23 0954  NA 121* 122* 122*  K 5.0 4.9 5.0  CL 91* 93* 93*  CO2 20* 19* 19*  GLUCOSE 94 96 93  BUN 21* 19 22*  CREATININE 2.16* 2.15* 2.32*  CALCIUM  9.0 8.9 8.8*    LFT Recent Labs    07/05/23 0954  PROT 5.8*  ALBUMIN  2.9*  AST 63*  ALT 46*  ALKPHOS 115  BILITOT 2.2*   PT/INR Recent Labs    07/03/23 1354 07/05/23 0954  LABPROT 17.4* 18.3*  INR 1.4* 1.5*     Imaging:  DG CHEST PORT 1 VIEW CLINICAL DATA:  Decompensated cirrhosis.  EXAM: PORTABLE CHEST 1 VIEW  COMPARISON:  Radiograph yesterday  FINDINGS: Stable heart size and mediastinal contours. Improvement in interstitial opacities. Small left pleural effusion on CT earlier today is not well seen by radiograph. No focal airspace disease. No pneumothorax.  IMPRESSION: Improvement in interstitial opacities from yesterday. Small left pleural effusion on CT earlier today is not well seen by radiograph  Electronically Signed   By: Chadwick Colonel M.D.   On: 07/04/2023 23:03 CT VENOGRAM ABDOMEN PELVIS CLINICAL DATA:  Bowel obstruction suspected Question occlusion of TIPS  EXAM: CT VENOGRAM ABDOMEN AND PELVIS  TECHNIQUE: Venographic phase images of the abdomen and pelvis were obtained following the administration of intravenous contrast. Multiplanar reformats and maximum intensity projections were generated.  RADIATION DOSE REDUCTION: This exam was performed according to the departmental dose-optimization program which includes automated exposure control, adjustment of the mA and/or kV according to patient size and/or use of iterative reconstruction technique.  CONTRAST:  OMNIPAQUE  IOHEXOL  350 MG/ML SOLN  COMPARISON:  Hepatic Doppler earlier today demonstrating occluded TIPS. CT  05/19/2023  FINDINGS: Veins: Majority of the TIPS demonstrates no contrast opacification consistent with occlusion. There may be minimal contrast within the hepatic and portal venous aspects, however the majority of the stent is occluded. The extrahepatic portal vein appears patent. The splenic vein is patent. The renal veins are patent. The IVC and iliac veins are patent. No  filling defects in the SMV or IMV.  Lower chest: Small left and trace right pleural effusion.  Hepatobiliary: Cirrhotic hepatic morphology. No evidence of focal liver lesion. Clips in the gallbladder fossa postcholecystectomy. No biliary dilatation.  Pancreas: No pancreatic ductal dilatation. No evidence of pancreatic mass. There is generalized edema of the mesenteric fat which is not disproportionately affecting the pancreas.  Spleen: No splenomegaly.  Greatest splenic length is 12.4 cm.  Adrenals/Urinary Tract: No adrenal nodule. No hydronephrosis or renal calculi. Low-density lesions in the left kidney are too small to characterize. No further follow-up imaging is recommended. Foley catheter decompresses the urinary bladder.  Stomach/Bowel: Detailed bowel assessment is limited in the presence of ascites and lack contrast. There are paraesophageal and perigastric varices. Ingested material distends the stomach. No small bowel distention or evidence of obstruction. Normal appendix visualized. Wall thickening of the ascending and proximal transverse colon may be due to portal colopathy. Mild gaseous distension of transverse colon. Sigmoid colon is redundant. Small volume of formed colonic stool.  Vascular/Lymphatic: Aortic and branch atherosclerosis. No aneurysm. There are prominent portal caval nodes are likely reactive.  Reproductive: Prostate is unremarkable.  Other: Moderate volume abdominopelvic ascites. Ascites tracks into an umbilical hernia. Generalized edema of the intra-abdominal and subcutaneous fat. No free intra-abdominal air.  Musculoskeletal: The bones are diffusely under mineralized. Scattered Schmorl's nodes in the spine. There are no acute or suspicious osseous abnormalities.  IMPRESSION: 1. CT confirms occluded TIPS. 2. Cirrhosis with moderate ascites.  Paraesophageal varices. 3. Wall thickening of the cecum and ascending colon may represent portal  colopathy. 4. No bowel obstruction.  Aortic Atherosclerosis (ICD10-I70.0).  Electronically Signed   By: Chadwick Colonel M.D.   On: 07/04/2023 19:33 IR Paracentesis INDICATION: Patient with a history of cirrhosis with recurrent ascites. Interventional radiology asked to perform a therapeutic paracentesis.  EXAM: ULTRASOUND GUIDED PARACENTESIS  MEDICATIONS: 1% lidocaine  20 mL  COMPLICATIONS: None immediate.  PROCEDURE: Informed written consent was obtained from the patient after a discussion of the risks, benefits and alternatives to treatment. A timeout was performed prior to the initiation of the procedure.  Initial ultrasound scanning demonstrates a large amount of ascites within the right lower abdominal quadrant. The right lower abdomen was prepped and draped in the usual sterile fashion. 1% lidocaine  was used for local anesthesia.  Following this, a 19 gauge, 10-cm, Yueh catheter was introduced. An ultrasound image was saved for documentation purposes. The paracentesis was performed. The catheter was removed and a dressing was applied. The patient tolerated the procedure well without immediate post procedural complication. Patient received post-procedure intravenous albumin ; see nursing notes for details.  FINDINGS: A total of approximately 6.1 L of clear yellow fluid was removed.  IMPRESSION: Successful ultrasound-guided paracentesis yielding 6.1 liters of peritoneal fluid.  Procedure performed by Jetta Morrow NP  PLAN: The patient is enrolled in the [facility] and is being actively followed.  He is status post TIPS on 07/20/2022 with Dr. Kelton Patron, MD  [facility]  [facility]  Electronically Signed   By: Art Largo M.D.   On:  07/04/2023 13:52 US  ABDOMEN LIMITED WITH LIVER DOPPLER CLINICAL DATA:  Decompensated cirrhosis, post TIPS  creation 07/20/2022  EXAM: DUPLEX ULTRASOUND OF LIVER  TECHNIQUE: Color and duplex Doppler ultrasound was performed to evaluate the hepatic in-flow and out-flow vessels.  COMPARISON:  None Available.  FINDINGS: Liver: Small liver with nodular contour.  No focal lesion, mass or intrahepatic biliary ductal dilatation.  Main Portal Vein size: 0.6 cm cm  Portal Vein Velocities (hepatopetal):  Main Prox:  17 cm/sec  Main Mid: 22 cm/sec  Main Dist:  19 cm/sec Right: Limited visualization, flow signal in the proximal aspect  TIPS stent is identified, with no internal flow or color Doppler signal in its mid and hepatic vein components.  Hepatic Vein Velocities (hepatofugal):  Right:  27 cm/sec  Middle:  28 cm/sec  Left:  23 cm/sec  IVC: Present and patent with normal respiratory phasicity. Velocity 24 cm/sec.  Hepatic Artery Velocity:  89 cm/sec  Splenic Vein Velocity:  22 cm/sec  Spleen: 11.7 cm x 10.9 cm x 3.8 cm with a total volume of 252 cm^3 (411 cm^3 is upper limit normal)  Portal Vein Occlusion/Thrombus: No  Splenic Vein Occlusion/Thrombus: No  Ascites: Small to moderate  Varices: None  IMPRESSION: 1. Occluded TIPS stent with no internal flow or color Doppler signal in its mid and hepatic vein components. 2. Small liver with nodular contour. 3. Small to moderate ascites.  Electronically Signed   By: Nicoletta Barrier M.D.   On: 07/04/2023 13:11     Scheduled inpatient medications:   amitriptyline   10 mg Oral QHS   feeding supplement  237 mL Oral BID BM   lactulose   30 g Oral QID   midodrine   10 mg Oral TID WC   pantoprazole   40 mg Oral BID   rifaximin   550 mg Oral BID   Continuous inpatient infusions:  PRN inpatient medications: acetaminophen  **OR** acetaminophen , ondansetron  **OR** ondansetron  (ZOFRAN ) IV, mouth rinse, oxyCODONE   Vital signs in last 24 hours: Temp:  [97.5 F (36.4 C)-98.2 F (36.8 C)] 97.5 F (36.4 C) (01/15 0838) Pulse  Rate:  [74-92] 92 (01/15 0838) Resp:  [12-18] 16 (01/15 0838) BP: (90-125)/(46-71) 90/46 (01/15 0838) SpO2:  [100 %] 100 % (01/15 0838) Weight:  [161 kg-126.1 kg] 126.1 kg (01/14 1502) Last BM Date : 07/05/23  Intake/Output Summary (Last 24 hours) at 07/05/2023 1219 Last data filed at 07/05/2023 0832 Gross per 24 hour  Intake 1004.26 ml  Output 1575 ml  Net -570.74 ml    Intake/Output from previous day: 01/14 0701 - 01/15 0700 In: 884.3 [P.O.:240; I.V.:605; IV Piggyback:39.3] Out: 1575 [Urine:1575] Intake/Output this shift: Total I/O In: 120 [P.O.:120] Out: -    Physical Exam:  General: Alert male in NAD Heart:  Regular rate and rhythm.  Pulmonary: Normal respiratory effort Abdomen: Soft, distended. Tympanitic , a few "metallic" bowel sounds. Non-tender. Neurologic: Alert and oriented Psych: Pleasant. Cooperative. Insight appears normal.      LOS: 1 day   Mai Schwalbe ,NP 07/05/2023, 12:19 PM

## 2023-07-05 NOTE — Progress Notes (Addendum)
Erik Howe:[id number] DOB: 03-Jul-1970 DOA: 07/03/2023 PCP: [name], PA-C  Brief History   The patient is a 53 yr old man who presented to [facility] on 07/03/2023 with complaints of progressively worsening shortness of breath and abdominal distention. 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 Since that procedure the patient has had multiple paracentesis ( about q2 weeks), and he has been admitted for hepatic encephalopathy. On admission the patient's sodium was 120, His creatinine was 1.8 (baseline 1.0) and his T bili was 3.6. AST and ALT were somewhat elevated at 75 and 50. INR was 1.4 and lactic acid was 2.2  There was a diagnostic paracentesis performed in the ED. 3100 cc Fluid was removed that was serosanguinous and cloudy. Culture has had no growth. Gram stain demonstrated no WBC and no organisms were seen. Nucleated cells were 78. Protein was low and Glucose was normal. No evidence of infection.  He was given albumin with that paracentesis.   AKI and hyponatremia were due to intravascular volume depletion.   The patient was admitted to a medical bed with telemetry. Today his creatinine increased to 2.15. Earlier today the patient was taken for large volume paracentesis. 6.1 L were removed. The patent was given 25 g albumin.   The patient underwent a CT Venogram of the abdomen to evaluate the patency of the TIPS. It was occluded. Plans are to have the TIPS revised later today.  Creatinine has increased again on 07/05/2023. It is now 2.32. Nephrology has been consulted and the patient is receiving another dose of albumin.  Sister is at bedside. All questions answered to the best of my ability.  A & P  Decompensated cirrhosis complicated by refractory ascites - Patient had paracentesis 2 weeks ago with significant  abdominal distention - Shortness of breath - Paracentesis in the emergency department without calculated cell count - Previous TIPS appears to be completely occluded accounting for the rapid re-accumulation of fluid after his recent paracentesis. Plan is for him to go to IR for revision on 07/05/2023. -- Torsemide and spironolactone have been held. --Ceftriaxone for SBP prophylaxis -- The patient is somnolent, but easily awaken.    AKI --Ddx 1) Intravascular volume depletion due to paracentesis, 2) Hepatorenal syndrome 3)diuresis --Albumin given - 3 doses now in total. --Spironolactone and lasix have been held.  Insomnia-continue Elavil --The patient likely has her days and nights reversed. He is somnolent. Do not feel that this is due to hepatic encephalopathy as the patient is completely alert and oriented when he is awakened. Not difficult to awaken. Monitor.   GERD-continue Protonix  Acute Metabolic Acidosis --  Due to worsening renal function  Thrombocytopenia -  Due to decompensated hepatic cirrhosis. Monitor.     Admission status: Inpatient Telemetry Medical  I have seen and examined this patient myself. I have spent 34 minutes in her evaluation and care.  DVT prophylaxis: SCD's Code Status: Full Code Family Communication: Sister is at bedside Disposition Plan: tbd    [name], DO [facility] Direct contact: see [email or web address]  7PM-7AM contact night coverage as above 07/05/2023, 4:28 PM  LOS: 1 day   Consultants  Gastroenterology  Procedures  Paracentesis x 2 Todal of 9.2 liters removed.  Antibiotics  Ceftriaxone 1 gm Q 24 hours  Interval History/Subjective  The patient is somnolent. He states that  he hasn't been able to sleep for a few days. When awake he is completely awake and alert. He has no new complaints.  Objective   Vitals:  Vitals:   07/05/23 1252 07/05/23 1548  BP: 101/81 115/77  Pulse: 90 93  Resp: 18 16  Temp: 97.6 F (36.4 C) 97.7  F (36.5 C)  SpO2: 100% 100%    Exam:  Exam:  Constitutional:  The patient is awake, alert, and oriented x 3. No acute distress. Eyes:  pupils and irises appear normal Normal lids and conjunctivae Sclera are jaundiced Respiratory:  No increased work of breathing. No wheezes, rales, or rhonchi No tactile fremitus Cardiovascular:  Regular rate and rhythm No murmurs, ectopy, or gallups. No lateral PMI. No thrills. Abdomen:  Abdomen is soft, non-tender, distended No hernias, masses, or organomegaly Normoactive bowel sounds.  Musculoskeletal:  No cyanosis, clubbing, or edema Skin:  No rashes, lesions, ulcers palpation of skin: no induration or nodules Jaundiced Neurologic:  CN 2-12 intact Sensation all 4 extremities intact Psychiatric:  Mental status Mood, affect appropriate Orientation to person, place, time  judgment and insight appear intact   I have personally reviewed the following:   Today's Data   Vitals:   07/05/23 1252 07/05/23 1548  BP: 101/81 115/77  Pulse: 90 93  Resp: 18 16  Temp: 97.6 F (36.4 C) 97.7 F (36.5 C)  SpO2: 100% 100%     Lab Data  CBC    Component Value Date/Time   WBC 8.3 07/05/2023 0356   RBC 3.03 (L) 07/05/2023 0356   HGB 9.5 (L) 07/05/2023 0356   HGB 11.5 (L) 10/19/2022 1213   HCT 26.9 (L) 07/05/2023 0356   PLT 107 (L) 07/05/2023 0356   PLT 163 10/19/2022 1213   MCV 88.8 07/05/2023 0356   MCH 31.4 07/05/2023 0356   MCHC 35.3 07/05/2023 0356   RDW 15.0 07/05/2023 0356   LYMPHSABS 0.7 07/05/2023 0356   MONOABS 1.0 07/05/2023 0356   EOSABS 0.1 07/05/2023 0356   BASOSABS 0.0 07/05/2023 0356       Latest Ref Rng & Units 07/05/2023    9:54 AM 07/05/2023    3:56 AM 07/04/2023    5:01 PM  CMP  Glucose 70 - 99 mg/dL 93  96  94   BUN 6 - 20 mg/dL 22  19  21    Creatinine 0.61 - 1.24 mg/dL 3.24  4.01  0.27   Sodium 135 - 145 mmol/L 122  122  121   Potassium 3.5 - 5.1 mmol/L 5.0  4.9  5.0   Chloride 98 - 111 mmol/L  93  93  91   CO2 22 - 32 mmol/L 19  19  20    Calcium 8.9 - 10.3 mg/dL 8.8  8.9  9.0   Total Protein 6.5 - 8.1 g/dL 5.8     Total Bilirubin 0.0 - 1.2 mg/dL 2.2     Alkaline Phos 38 - 126 U/L 115     AST 15 - 41 U/L 63     ALT 0 - 44 U/L 46        Micro Data   Results for orders placed or performed during the hospital encounter of 07/03/23  Body fluid culture w Gram Stain     Status: None (Preliminary result)   Collection Time: 07/03/23  4:39 PM   Specimen: Peritoneal Cavity; Peritoneal Fluid  Result Value Ref Range Status   Specimen Description PERITONEAL CAVITY  Final   Special Requests NONE  Final   Gram Stain NO WBC SEEN NO ORGANISMS SEEN   Final   Culture   Final    NO GROWTH 2 DAYS Performed at [facility], [address], [place], [place] [address]    Report Status PENDING  Incomplete     Imaging  CT venogram of the abdomen  Scheduled Meds:  amitriptyline  10 mg Oral QHS   feeding supplement  237 mL Oral BID BM   lactulose  30 g Oral TID   midodrine  10 mg Oral TID WC   pantoprazole  40 mg Oral BID   rifaximin  550 mg Oral BID    Principal Problem:   Decompensated cirrhosis (HCC) Active Problems:   Hyponatremia   AKI (acute kidney injury) (HCC)   LOS: 1 day

## 2023-07-05 NOTE — H&P (Signed)
Chief Complaint: Patient was seen in consultation today for  Chief Complaint  Patient presents with   Abdominal Pain   Supervising Physician: Marliss Coots  Patient Status: [facility] - In-pt  History of Present Illness: Erik Howe is a 53 y.o. male with a medical history significant for anxiety/depression, HTN, obesity, GERD with esophagitis, chronic lower extremity swelling, polyposis and ichthyosis. 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 Lowella Dandy and has required regular paracenteses since the procedure albeit with less frequency and volume. He has complained of mild confusion since the procedure. A TIPS ultrasound 09/26/22 showed the TIPS to be patent.   His is followed closely by Dr. Tomasa Rand with GI and he is undergoing work up with [facility] for a liver transplant. He was hospitalized twice in December 2024 for hepatic encephalopathy. He presented to the ED 07/03/23 with abdominal pain, weakness, urinary retention and shortness of breath. The patient had been requiring a paracentesis approximately twice per month but felt like the fluid was reaccumulating faster. Work up in the ED showed a bilirubin of 3.6, lactic acid 2.2, AKI (likely secondary to hepatorenal syndrome), hypotension and abdominal distention. A paracentesis was performed in IR 07/04/23 with 6.1 L of clear yellow fluid removed. Imaging including [place] and a CT venogram showed the TIPS to be occluded.   Imaging reviewed by Dr. Elby Showers with the recommendation to proceed with a TIPS thrombectomy and revision under moderate sedation. The procedure was discussed with the hospitalist and GI teams with agreement to proceed.    Past Medical History:  Diagnosis Date   Anxiety    Arthritis    Cellulitis 12/18/2012   left foot   Cirrhosis of liver (HCC)     Depression    GERD (gastroesophageal reflux disease)    Hypertension    no longer a problem   Obesity    Varicose veins     Past Surgical History:  Procedure Laterality Date   BIOPSY  08/25/2022   Procedure: BIOPSY;  Surgeon: Jenel Lucks, MD;  Location: [facility];  Service: Gastroenterology;;   CHOLECYSTECTOMY     COLONOSCOPY WITH PROPOFOL N/A 08/25/2022   Procedure: COLONOSCOPY WITH PROPOFOL;  Surgeon: Jenel Lucks, MD;  Location: [facility];  Service: Gastroenterology;  Laterality: N/A;   ENDOVENOUS ABLATION SAPHENOUS VEIN W/ LASER Left 09-30-2013   left greater saphenous vein   ESOPHAGOGASTRODUODENOSCOPY (EGD) WITH PROPOFOL N/A 08/25/2022   Procedure: ESOPHAGOGASTRODUODENOSCOPY (EGD) WITH PROPOFOL;  Surgeon: Jenel Lucks, MD;  Location: [facility];  Service: Gastroenterology;  Laterality: N/A;   HOT HEMOSTASIS N/A 08/25/2022   Procedure: HOT HEMOSTASIS (ARGON PLASMA COAGULATION/BICAP);  Surgeon: Jenel Lucks, MD;  Location: [facility];  Service: Gastroenterology;  Laterality: N/A;   IR IVUS EACH ADDITIONAL NON CORONARY VESSEL  07/20/2022   IR PARACENTESIS  05/03/2022   IR PARACENTESIS  05/18/2022   IR PARACENTESIS  06/02/2022   IR PARACENTESIS  06/10/2022   IR PARACENTESIS  06/24/2022   IR PARACENTESIS  06/30/2022   IR PARACENTESIS  07/08/2022   IR PARACENTESIS  07/15/2022   IR PARACENTESIS  07/20/2022   IR PARACENTESIS  07/29/2022   IR PARACENTESIS  08/12/2022   IR PARACENTESIS  08/19/2022   IR PARACENTESIS  09/06/2022   IR PARACENTESIS  09/22/2022   IR PARACENTESIS  10/21/2022   IR PARACENTESIS  11/17/2022  IR PARACENTESIS  12/07/2022   IR PARACENTESIS  12/21/2022   IR PARACENTESIS  01/06/2023   IR PARACENTESIS  01/23/2023   IR PARACENTESIS  02/23/2023   IR PARACENTESIS  03/09/2023   IR PARACENTESIS  03/31/2023   IR PARACENTESIS  05/01/2023   IR PARACENTESIS  05/16/2023   IR PARACENTESIS  06/19/2023   IR PARACENTESIS  07/04/2023   IR TIPS  07/20/2022   IR  US GUIDE VASC ACCESS RIGHT  07/20/2022   IR US GUIDE VASC ACCESS RIGHT  07/20/2022   POLYPECTOMY  08/25/2022   Procedure: POLYPECTOMY;  Surgeon: Jenel Lucks, MD;  Location: [facility];  Service: Gastroenterology;;   RADIOLOGY WITH ANESTHESIA N/A 07/20/2022   Procedure: TIPS;  Surgeon: Richarda Overlie, MD;  Location: [facility] OR;  Service: Radiology;  Laterality: N/A;    Allergies: Tylenol [acetaminophen], Dust mite extract, Paroxetine, Pollen extract, and Tramadol  Medications: Prior to Admission medications   Medication Sig Start Date End Date Taking? Authorizing Provider  albuterol (VENTOLIN HFA) 108 (90 Base) MCG/ACT inhaler Inhale 2 puffs into the lungs every 6 (six) hours as needed for shortness of breath 04/11/22  Yes   amitriptyline (ELAVIL) 10 MG tablet Take 1 tablet (10 mg total) by mouth at bedtime. 09/15/22  Yes   ammonium lactate (LAC-HYDRIN) 12 % lotion Apply topically as needed for dry skin. Twice a day 03/02/23  Yes   Baclofen 5 MG TABS Take 2 tablets (10 mg total) by mouth 3 (three) times daily. 02/02/23  Yes Drazek, Dawn, CRNP  bismuth subsalicylate (PEPTO BISMOL) 262 MG/15ML suspension Take 30 mLs by mouth every 6 (six) hours as needed for indigestion.   Yes [provider]  clobetasol ointment (TEMOVATE) 0.05 % Apply topically 2 (two) times a day. To affected area (right hand) 03/02/23  Yes   diphenhydrAMINE (BENADRYL) 50 MG tablet Take 50 mg by mouth every 8 (eight) hours as needed for itching or allergies.   Yes [provider]  docusate sodium (COLACE) 100 MG capsule Take 200 mg by mouth daily as needed (constipation.).   Yes [provider]  lactulose (CHRONULAC) 10 GM/15ML solution Take 3.33 g by mouth 3 (three) times daily.   Yes [provider]  magnesium oxide (MAG-OX) 400 (240 Mg) MG tablet Take 400 mg by mouth daily.   Yes [provider]  midodrine (PROAMATINE) 5 MG tablet Take 1 tablet (5 mg total) by mouth 3 (three) times  daily with meals. 06/11/23 07/11/23 Yes Pahwani, Daleen Bo, MD  ondansetron (ZOFRAN-ODT) 8 MG disintegrating tablet Take 1 tablet (8 mg total) by mouth 3 (three) times daily as needed for nausea. 06/07/22  Yes   pantoprazole (PROTONIX) 40 MG tablet Take 1 tablet (40 mg total) by mouth 2 (two) times daily. 06/29/23  Yes Jenel Lucks, MD  potassium chloride SA (KLOR-CON M) 20 MEQ tablet Take 2 tablets (40 mEq total) by mouth daily. 09/26/22  Yes Jenel Lucks, MD  silver sulfADIAZINE (SILVADENE) 1 % cream Apply topically 2 (two) times a day. To affected areas Patient taking differently: Apply 1 Application topically 2 (two) times daily as needed (for burn, itch). 03/02/23  Yes   spironolactone (ALDACTONE) 100 MG tablet Take 2 tablets (200 mg total) by mouth daily. 05/24/23  Yes Dorcas Carrow, MD  sucralfate (CARAFATE) 1 g tablet Take 1 tablet (1 g total) by mouth 4 (four) times daily -  with meals and at bedtime. Please dissolve the tablet in a tablespoon of water and  drink the liquid 4 times a day 06/06/23  Yes Jenel Lucks, MD  torsemide (DEMADEX) 20 MG tablet Take 2 tablets (40 mg total) by mouth daily. 05/25/23 07/03/23 Yes Dorcas Carrow, MD     Family History  Problem Relation Age of Onset   Varicose Veins Mother    Heart disease Mother    Diabetes Mother    Arthritis Mother    Hypertension Mother    Stroke Father    Heart disease Father    Colon cancer Neg Hx    Stomach cancer Neg Hx    Rectal cancer Neg Hx    Esophageal cancer Neg Hx     Social History   Socioeconomic History   Marital status: Single    Spouse name: Not on file   Number of children: Not on file   Years of education: Not on file   Highest education level: Not on file  Occupational History   Not on file  Tobacco Use   Smoking status: Former    Current packs/day: 0.00    Types: Cigarettes    Quit date: 09/18/2012    Years since quitting: 10.8   Smokeless tobacco: Never  Vaping Use   Vaping status:  Never Used  Substance and Sexual Activity   Alcohol use: No   Drug use: No   Sexual activity: Not on file  Other Topics Concern   Not on file  Social History Narrative   Not on file   Social Drivers of Health   Financial Resource Strain: Not on File (10/07/2021)   Received from [employer], [employer]    Financial Resource Strain: 0  Food Insecurity: No Food Insecurity (07/04/2023)   Hunger Vital Sign    Worried About Running Out of Food in the Last Year: Never true    Ran Out of Food in the Last Year: Never true  Transportation Needs: Patient Unable To Answer (07/04/2023)   PRAPARE - Transportation    Lack of Transportation (Medical): Patient unable to answer    Lack of Transportation (Non-Medical): Patient unable to answer  Physical Activity: Not on File (10/07/2021)   Received from [place], [place]   Physical Activity    Physical Activity: 0  Stress: Not on File (10/07/2021)   Received from [facility], [place]   Stress    Stress: 0  Social Connections: Not on File (03/02/2023)   Received from [employer]    Connectedness: 0    Review of Systems: A 12 point ROS discussed and pertinent positives are indicated in the HPI above.  All other systems are negative.  Review of Systems  Constitutional:  Positive for appetite change and fatigue.  Respiratory:  Positive for shortness of breath. Negative for cough.   Cardiovascular:  Positive for leg swelling. Negative for chest pain.  Gastrointestinal:  Positive for abdominal pain and nausea.  Neurological:  Positive for dizziness and weakness. Negative for headaches.    Vital Signs: BP (!) 90/46   Pulse 92   Temp (!) 97.5 F (36.4 C) (Oral)   Resp 16   Ht 6\' 1"  (1.854 m)   Wt 278 lb (126.1 kg)   SpO2 100%   BMI 36.68 kg/m   Physical Exam Constitutional:      General: He is not in acute distress.    Appearance: He is obese. He is not ill-appearing.  HENT:     Mouth/Throat:     Mouth: Mucous  membranes are  moist.     Pharynx: Oropharynx is clear.  Cardiovascular:     Pulses: Normal pulses.  Pulmonary:     Effort: Pulmonary effort is normal.  Abdominal:     General: There is distension.     Tenderness: There is abdominal tenderness.  Genitourinary:    Comments: Foley catheter Musculoskeletal:     Right lower leg: Edema present.     Left lower leg: Edema present.  Skin:    General: Skin is warm and dry.  Neurological:     Mental Status: He is alert and oriented to person, place, and time.  Psychiatric:        Mood and Affect: Mood normal.        Behavior: Behavior normal.        Thought Content: Thought content normal.        Judgment: Judgment normal.     Imaging: DG CHEST PORT 1 VIEW Result Date: 07/04/2023 CLINICAL DATA:  Decompensated cirrhosis. EXAM: PORTABLE CHEST 1 VIEW COMPARISON:  Radiograph yesterday FINDINGS: Stable heart size and mediastinal contours. Improvement in interstitial opacities. Small left pleural effusion on CT earlier today is not well seen by radiograph. No focal airspace disease. No pneumothorax. IMPRESSION: Improvement in interstitial opacities from yesterday. Small left pleural effusion on CT earlier today is not well seen by radiograph Electronically Signed   By: Narda Rutherford M.D.   On: 07/04/2023 23:03   CT VENOGRAM ABDOMEN PELVIS Result Date: 07/04/2023 CLINICAL DATA:  Bowel obstruction suspected Question occlusion of TIPS EXAM: CT VENOGRAM ABDOMEN AND PELVIS TECHNIQUE: Venographic phase images of the abdomen and pelvis were obtained following the administration of intravenous contrast. Multiplanar reformats and maximum intensity projections were generated. RADIATION DOSE REDUCTION: This exam was performed according to the departmental dose-optimization program which includes automated exposure control, adjustment of the mA and/or kV according to patient size and/or use of iterative reconstruction technique. CONTRAST:  OMNIPAQUE  IOHEXOL 350 MG/ML SOLN COMPARISON:  Hepatic Doppler earlier today demonstrating occluded TIPS. CT 05/19/2023 FINDINGS: Veins: Majority of the TIPS demonstrates no contrast opacification consistent with occlusion. There may be minimal contrast within the hepatic and portal venous aspects, however the majority of the stent is occluded. The extrahepatic portal vein appears patent. The splenic vein is patent. The renal veins are patent. The IVC and iliac veins are patent. No filling defects in the SMV or IMV. Lower chest: Small left and trace right pleural effusion. Hepatobiliary: Cirrhotic hepatic morphology. No evidence of focal liver lesion. Clips in the gallbladder fossa postcholecystectomy. No biliary dilatation. Pancreas: No pancreatic ductal dilatation. No evidence of pancreatic mass. There is generalized edema of the mesenteric fat which is not disproportionately affecting the pancreas. Spleen: No splenomegaly.  Greatest splenic length is 12.4 cm. Adrenals/Urinary Tract: No adrenal nodule. No hydronephrosis or renal calculi. Low-density lesions in the left kidney are too small to characterize. No further follow-up imaging is recommended. Foley catheter decompresses the urinary bladder. Stomach/Bowel: Detailed bowel assessment is limited in the presence of ascites and lack contrast. There are paraesophageal and perigastric varices. Ingested material distends the stomach. No small bowel distention or evidence of obstruction. Normal appendix visualized. Wall thickening of the ascending and proximal transverse colon may be due to portal colopathy. Mild gaseous distension of transverse colon. Sigmoid colon is redundant. Small volume of formed colonic stool. Vascular/Lymphatic: Aortic and branch atherosclerosis. No aneurysm. There are prominent portal caval nodes are likely reactive. Reproductive: Prostate is unremarkable. Other: Moderate volume abdominopelvic ascites. Ascites  tracks into an umbilical hernia.  Generalized edema of the intra-abdominal and subcutaneous fat. No free intra-abdominal air. Musculoskeletal: The bones are diffusely under mineralized. Scattered Schmorl's nodes in the spine. There are no acute or suspicious osseous abnormalities. IMPRESSION: 1. CT confirms occluded TIPS. 2. Cirrhosis with moderate ascites.  Paraesophageal varices. 3. Wall thickening of the cecum and ascending colon may represent portal colopathy. 4. No bowel obstruction. Aortic Atherosclerosis (ICD10-I70.0). Electronically Signed   By: Narda Rutherford M.D.   On: 07/04/2023 19:33   IR Paracentesis Result Date: 07/04/2023 INDICATION: Patient with a history of cirrhosis with recurrent ascites. Interventional radiology asked to perform a therapeutic paracentesis. EXAM: ULTRASOUND GUIDED PARACENTESIS MEDICATIONS: 1% lidocaine 20 mL COMPLICATIONS: None immediate. PROCEDURE: Informed written consent was obtained from the patient after a discussion of the risks, benefits and alternatives to treatment. A timeout was performed prior to the initiation of the procedure. Initial ultrasound scanning demonstrates a large amount of ascites within the right lower abdominal quadrant. The right lower abdomen was prepped and draped in the usual sterile fashion. 1% lidocaine was used for local anesthesia. Following this, a 19 gauge, 10-cm, Yueh catheter was introduced. An ultrasound image was saved for documentation purposes. The paracentesis was performed. The catheter was removed and a dressing was applied. The patient tolerated the procedure well without immediate post procedural complication. Patient received post-procedure intravenous albumin; see nursing notes for details. FINDINGS: A total of approximately 6.1 L of clear yellow fluid was removed. IMPRESSION: Successful ultrasound-guided paracentesis yielding 6.1 liters of peritoneal fluid. Procedure performed by Alwyn Ren NP PLAN: The patient is enrolled in the [facility]  Interventional Radiology Portal Hypertension [facility] and is being actively followed. He is status post TIPS on 07/20/2022 with Dr. Malachi Pro, MD [facility] Radiology Electronically Signed   By: Roanna Banning M.D.   On: 07/04/2023 13:52   US ABDOMEN LIMITED WITH LIVER DOPPLER Result Date: 07/04/2023 CLINICAL DATA:  Decompensated cirrhosis, post TIPS creation 07/20/2022 EXAM: DUPLEX ULTRASOUND OF LIVER TECHNIQUE: Color and duplex Doppler ultrasound was performed to evaluate the hepatic in-flow and out-flow vessels. COMPARISON:  None Available. FINDINGS: Liver: Small liver with nodular contour. No focal lesion, mass or intrahepatic biliary ductal dilatation. Main Portal Vein size: 0.6 cm cm Portal Vein Velocities (hepatopetal): Main Prox:  17 cm/sec Main Mid: 22 cm/sec Main Dist:  19 cm/sec Right: Limited visualization, flow signal in the proximal aspect TIPS stent is identified, with no internal flow or color Doppler signal in its mid and hepatic vein components. Hepatic Vein Velocities (hepatofugal): Right:  27 cm/sec Middle:  28 cm/sec Left:  23 cm/sec IVC: Present and patent with normal respiratory phasicity. Velocity 24 cm/sec. Hepatic Artery Velocity:  89 cm/sec Splenic Vein Velocity:  22 cm/sec Spleen: 11.7 cm x 10.9 cm x 3.8 cm with a total volume of 252 cm^3 (411 cm^3 is upper limit normal) Portal Vein Occlusion/Thrombus: No Splenic Vein Occlusion/Thrombus: No Ascites: Small to moderate Varices: None IMPRESSION: 1. Occluded TIPS stent with no internal flow or color Doppler signal in its mid and hepatic vein components. 2. Small liver with nodular contour. 3. Small to moderate ascites. Electronically Signed   By: Corlis Leak M.D.   On: 07/04/2023 13:11   DG Chest Portable 1 View Result Date: 07/03/2023 CLINICAL DATA:  Dyspnea EXAM: PORTABLE CHEST 1 VIEW COMPARISON:  06/08/2023 FINDINGS: The heart size and mediastinal contours are within normal limits.  Slightly increased interstitial markings bilaterally. Band-like atelectasis at  the right lung base. No pleural effusion or pneumothorax. The visualized skeletal structures are unremarkable. IMPRESSION: Slightly increased interstitial markings bilaterally, which may reflect bronchitic lung changes versus mild edema or developing atypical/viral infection. Electronically Signed   By: Duanne Guess D.O.   On: 07/03/2023 18:07   IR Paracentesis Result Date: 06/19/2023 INDICATION: 53 year old male with recurrent ascites. Request made for therapeutic paracentesis. EXAM: ULTRASOUND GUIDED THERAPEUTIC PARACENTESIS MEDICATIONS: 10 mL 1% lidocaine COMPLICATIONS: None immediate. PROCEDURE: Informed written consent was obtained from the patient after a discussion of the risks, benefits and alternatives to treatment. A timeout was performed prior to the initiation of the procedure. Initial ultrasound scanning demonstrates a large amount of ascites within the left lateral abdominal quadrant. The left lateral abdomen was prepped and draped in the usual sterile fashion. 1% lidocaine was used for local anesthesia. Following this, a 19 gauge, 7-cm, Yueh catheter was introduced. An ultrasound image was saved for documentation purposes. The paracentesis was performed. The catheter was removed and a dressing was applied. The patient tolerated the procedure well without immediate post procedural complication. Patient received post-procedure intravenous albumin; see nursing notes for details. FINDINGS: A total of approximately 7.1 of yellow fluid was removed. IMPRESSION: Successful ultrasound-guided paracentesis yielding 7.1 liters of peritoneal fluid. Performed by: Loyce Dys PA-C Electronically Signed   By: Irish Lack M.D.   On: 06/19/2023 16:16   MR BRAIN WO CONTRAST Result Date: 06/08/2023 CLINICAL DATA:  Mental status change with unknown cause. EXAM: MRI HEAD WITHOUT CONTRAST TECHNIQUE: Multiplanar, multiecho pulse  sequences of the brain and surrounding structures were obtained without intravenous contrast. COMPARISON:  Yesterday FINDINGS: Brain: No acute infarction, hemorrhage, hydrocephalus, extra-axial collection or mass lesion. No white matter disease or atrophy. Vascular: Normal flow voids. Skull and upper cervical spine: Normal marrow signal. Sinuses/Orbits: Negative. Other: Intermittent motion artifact. IMPRESSION: Negative motion degraded brain MRI. Electronically Signed   By: Tiburcio Pea M.D.   On: 06/08/2023 05:30   DG CHEST PORT 1 VIEW Result Date: 06/08/2023 CLINICAL DATA:  Altered mental status EXAM: PORTABLE CHEST 1 VIEW COMPARISON:  05/19/2023 FINDINGS: Normal cardiomediastinal silhouette. Low lung volumes. No focal consolidation, pleural effusion, or pneumothorax. No displaced rib fractures. IMPRESSION: No acute cardiopulmonary disease. Electronically Signed   By: Minerva Fester M.D.   On: 06/08/2023 01:43   CT HEAD WO CONTRAST ( ) Result Date: 06/08/2023 CLINICAL DATA:  Delirium EXAM: CT HEAD WITHOUT CONTRAST TECHNIQUE: Contiguous axial images were obtained from the base of the skull through the vertex without intravenous contrast. RADIATION DOSE REDUCTION: This exam was performed according to the departmental dose-optimization program which includes automated exposure control, adjustment of the mA and/or kV according to patient size and/or use of iterative reconstruction technique. COMPARISON:  CT brain 05/19/2023 FINDINGS: Brain: No evidence of acute infarction, hemorrhage, hydrocephalus, extra-axial collection or mass lesion/mass effect. Vascular: No hyperdense vessel or unexpected calcification. Skull: Normal. Negative for fracture or focal lesion. Sinuses/Orbits: No acute finding. Other: None IMPRESSION: Negative non contrasted CT appearance of the brain. Electronically Signed   By: Jasmine Pang M.D.   On: 06/08/2023 00:03    Labs:  CBC: Recent Labs    06/09/23 0348 07/03/23 1354  07/04/23 0551 07/05/23 0356  WBC 5.8 6.6 6.1 8.3  HGB 8.5* 9.8* 9.1* 9.5*  HCT 25.5* 28.1* 25.2* 26.9*  PLT 120* 123* 100* 107*    COAGS: Recent Labs    06/08/23 0112 06/08/23 0646 07/03/23 1354 07/05/23 0954  INR 1.8* 1.8* 1.4*  1.5*    BMP: Recent Labs    07/04/23 0551 07/04/23 1701 07/05/23 0356 07/05/23 0954  NA 121* 121* 122* 122*  K 4.7 5.0 4.9 5.0  CL 92* 91* 93* 93*  CO2 20* 20* 19* 19*  GLUCOSE 98 94 96 93  BUN 19 21* 19 22*  CALCIUM 9.0 9.0 8.9 8.8*  CREATININE 1.92* 2.16* 2.15* 2.32*  GFRNONAA 41* 36* 36* 33*    LIVER FUNCTION TESTS: Recent Labs    06/11/23 0432 07/03/23 1354 07/04/23 0551 07/05/23 0954  BILITOT 3.1* 3.6* 3.6* 2.2*  AST 45* 75* 66* 63*  ALT 30 50* 48* 46*  ALKPHOS 112 125 108 115  PROT 5.8* 6.0* 5.8* 5.8*  ALBUMIN 2.6* 2.9* 3.0* 2.9*    TUMOR MARKERS: No results for input(s): "AFPTM", "CEA", "CA199", "CHROMGRNA" in the last 8760 hours.  Assessment and Plan:  MASH Cirrhosis with portal hypertension s/p TIPS creation 07/20/22; refractory ascites with occluded TIPS: Erik Howe, 53 year old male, is scheduled today for an image-guided transjugular intrahepatic portosystemic shunt thrombectomy with revision. The procedure was discussed at the bedside with the patient and his sister Morene Antu. Written consent was signed by the patient.   Risks and benefits of TIPS, BRTO and/or additional variceal embolization were discussed with the patient and/or the patient's family including, but not limited to, infection, bleeding, damage to adjacent structures, worsening hepatic and/or cardiac function, worsening and/or the development of altered mental status/encephalopathy, non-target embolization and death.   This interventional procedure involves the use of X-rays and because of the nature of the planned procedure, it is possible that we will have prolonged use of X-ray fluoroscopy.  Potential radiation risks to you include (but are  not limited to) the following: - A slightly elevated risk for cancer  several years later in life. This risk is typically less than 0.5% percent. This risk is low in comparison to the normal incidence of human cancer, which is 33% for women and 50% for men according to the American Cancer Society. - Radiation induced injury can include skin redness, resembling a rash, tissue breakdown / ulcers and hair loss (which can be temporary or permanent).   The likelihood of either of these occurring depends on the difficulty of the procedure and whether you are sensitive to radiation due to previous procedures, disease, or genetic conditions.   IF your procedure requires a prolonged use of radiation, you will be notified and given written instructions for further action.  It is your responsibility to monitor the irradiated area for the 2 weeks following the procedure and to notify your physician if you are concerned that you have suffered a radiation induced injury.    All of the patient's questions were answered, patient is agreeable to proceed. He has been NPO since approximately 0900. He does not take blood-thinning medications.   Consent signed and in IR.    Thank you for this interesting consult.  I greatly enjoyed meeting Erik Howe and look forward to participating in their care.  A copy of this report was sent to the requesting provider on this date.  Electronically Signed: Alwyn Ren, AGACNP-BC 07/05/2023, 11:07 AM   I spent a total of 40 Minutes    in face to face in clinical consultation, greater than 50% of which was counseling/coordinating care for cirrhosis with refractory ascites.

## 2023-07-05 NOTE — TOC CM/SW Note (Signed)
 Transition of Care [facility]) - Inpatient Brief Assessment   Patient Details  Name: Erik Howe MRN: [id number] Date of Birth: 1970-09-24  Transition of Care [facility]) CM/SW Contact:    Tom-Johnson, Angelique Ken, RN Phone Number: 07/05/2023, 3:49 PM   Clinical Narrative:  Patient presented to the ED with  worsening Abdominal pain and SOB.  Patient had TIPS creation on 07/20/22 and received Paracentesis 2 weeks ago with 7 L removed, usually needs a Paracentesis twice a month.   Patient underwent Paracentesis in the ED department with 6.1 L of clear yellow fluid removed. GI consulted, plan for Thrombectomy and TIPS revision today 07/05/23 for occluded TIPS by IR.   CM spoke with patient and sister, Erik Howe at bedside about needs for post hospital transition.  From home alone, does not have children. Sister Erik Howe supportive. Not employed, on disability. Does not have DME's at home, no home O2, currently on 1L O2. Patient states he recently purchased a shower seat from [employer], pending delivery.   Patient has no PCP, has no preference. CM scheduled hospital f/u and new patient establishment at [facility], info on AVS.   Patient not Medically ready for discharge.  CM will continue to follow as patient progresses with care towards discharge.           Transition of Care Asessment: Insurance and Status: Insurance coverage has been reviewed Patient has primary care physician: No [facility] f/u info on AVS.) Home environment has been reviewed: Yes Prior level of function:: Independent Prior/Current Home Services: No current home services Social Drivers of Health Review: SDOH reviewed no interventions necessary Readmission risk has been reviewed: Yes Transition of care needs: transition of care needs identified, TOC will continue to follow

## 2023-07-05 NOTE — Progress Notes (Signed)
   07/05/23 1429  Spiritual Encounters  Type of Visit Follow up  Care provided to: Pt and family  Referral source Nurse (RN/NT/LPN)  Reason for visit Advance directives  OnCall Visit No  Advance Directives (For Healthcare)  Does Patient Have a Medical Advance Directive? No  Would patient like information on creating a medical advance directive? Yes (Inpatient - patient requests chaplain consult to create a medical advance directive)    Chaplains Doylene Genet and Zoila Hines responded to consult for information on AD. Upon assessment, it was determined that pt was able to make healthcare decisions on his own, although his condition causes periods of cognitive impairment at times. At this time, he was alert and aware, and answered all of our questions. [name] sister was present, and both she and pt agreed about how to proceed with AD. Documents have been left with pt and sister to be completed at a later time, and they were made aware that chaplains are available to complete the process when they are ready.

## 2023-07-05 NOTE — Consult Note (Signed)
 [facility] Nephrology Consultation Note  Requesting MD: Dr. Rosaline Coma, Ava Reason for consult: AKI  HPI:  Erik Howe is a 53 y.o. male with past medical history significant for hypertension, anxiety depression, alcohol abuse with decompensated liver cirrhosis status post TIPS now undergoing liver transplant evaluation at [facility], cholecystectomy, presented with abdominal pain and worsening ascites, seen as a consultation for further evaluation and management of acute kidney injury. The patient has had multiple episodes of AKI in the past with his baseline serum creatinine level seems to be around 0.9.  He was admitted in 05/2023 for hepatic encephalopathy treated with lactulose .  He receives frequent paracentesis for the management of ascites. On admission, the labs showed sodium 128, creatinine level 1.8, lactic acidosis.  He had diagnostic paracentesis done in the ER with removal of 3.1 L of fluid.  Reportedly the fluid was serosanguineous and cloudy however the culture has no growth.  The patient received another paracentesis on 1/14 with removal of 6.1 L of fluid.  He has been followed by GI and IR in the hospital.  The CT venogram showed that the TIPS was occluded therefore IR is planning to do declotting procedure tomorrow. Today the lab showed creatinine level further worsened to 2.32, potassium 5.0, sodium 122, BUN 22, serum albumin  2.9, mildly elevated AST and ALT and total bilirubin level improved to 2.2.  INR 1.5.  Reportedly the patient had urinary retention therefore required Foley catheter insertion.  Urine output is recorded around 1.5 L.  The CT scan done back in 04/2023 reported normal kidneys without hydronephrosis or renal calculi. The patient is now on IV albumin , midodrine  and octreotide  for the management of hepatorenal syndrome.  He is currently feeling tired and somnolent.  He has no chest pain or shortness of breath however concerned about abdomen  distention.  Denies abdominal pain.  No nausea or vomiting.  He was accompanied by his sister at the bedside.  PMHx:   Past Medical History:  Diagnosis Date   Anxiety    Arthritis    Cellulitis 12/18/2012   left foot   Cirrhosis of liver (HCC)    Depression    GERD (gastroesophageal reflux disease)    Hypertension    no longer a problem   Obesity    Varicose veins     Past Surgical History:  Procedure Laterality Date   BIOPSY  08/25/2022   Procedure: BIOPSY;  Surgeon: Elois Hair, MD;  Location: [facility];  Service: Gastroenterology;;   CHOLECYSTECTOMY     COLONOSCOPY WITH PROPOFOL  N/A 08/25/2022   Procedure: COLONOSCOPY WITH PROPOFOL ;  Surgeon: Elois Hair, MD;  Location: [facility];  Service: Gastroenterology;  Laterality: N/A;   ENDOVENOUS ABLATION SAPHENOUS VEIN W/ LASER Left 09-30-2013   left greater saphenous vein   ESOPHAGOGASTRODUODENOSCOPY (EGD) WITH PROPOFOL  N/A 08/25/2022   Procedure: ESOPHAGOGASTRODUODENOSCOPY (EGD) WITH PROPOFOL ;  Surgeon: Elois Hair, MD;  Location: [facility];  Service: Gastroenterology;  Laterality: N/A;   HOT HEMOSTASIS N/A 08/25/2022   Procedure: HOT HEMOSTASIS (ARGON PLASMA COAGULATION/BICAP);  Surgeon: Elois Hair, MD;  Location: [facility];  Service: Gastroenterology;  Laterality: N/A;   IR IVUS EACH ADDITIONAL NON CORONARY VESSEL  07/20/2022   IR PARACENTESIS  05/03/2022   IR PARACENTESIS  05/18/2022   IR PARACENTESIS  06/02/2022   IR PARACENTESIS  06/10/2022   IR PARACENTESIS  06/24/2022   IR PARACENTESIS  06/30/2022   IR PARACENTESIS  07/08/2022   IR PARACENTESIS  07/15/2022  IR PARACENTESIS  07/20/2022   IR PARACENTESIS  07/29/2022   IR PARACENTESIS  08/12/2022   IR PARACENTESIS  08/19/2022   IR PARACENTESIS  09/06/2022   IR PARACENTESIS  09/22/2022   IR PARACENTESIS  10/21/2022   IR PARACENTESIS  11/17/2022   IR PARACENTESIS  12/07/2022   IR PARACENTESIS  12/21/2022   IR PARACENTESIS  01/06/2023   IR  PARACENTESIS  01/23/2023   IR PARACENTESIS  02/23/2023   IR PARACENTESIS  03/09/2023   IR PARACENTESIS  03/31/2023   IR PARACENTESIS  05/01/2023   IR PARACENTESIS  05/16/2023   IR PARACENTESIS  06/19/2023   IR PARACENTESIS  07/04/2023   IR TIPS  07/20/2022   IR US  GUIDE VASC ACCESS RIGHT  07/20/2022   IR US  GUIDE VASC ACCESS RIGHT  07/20/2022   POLYPECTOMY  08/25/2022   Procedure: POLYPECTOMY;  Surgeon: Elois Hair, MD;  Location: [facility];  Service: Gastroenterology;;   RADIOLOGY WITH ANESTHESIA N/A 07/20/2022   Procedure: TIPS;  Surgeon: Elene Griffes, MD;  Location: [facility] OR;  Service: Radiology;  Laterality: N/A;    Family Hx:  Family History  Problem Relation Age of Onset   Varicose Veins Mother    Heart disease Mother    Diabetes Mother    Arthritis Mother    Hypertension Mother    Stroke Father    Heart disease Father    Colon cancer Neg Hx    Stomach cancer Neg Hx    Rectal cancer Neg Hx    Esophageal cancer Neg Hx     Social History:  reports that he quit smoking about 10 years ago. His smoking use included cigarettes. He has never used smokeless tobacco. He reports that he does not drink alcohol and does not use drugs.  Allergies:  Allergies  Allergen Reactions   Tylenol  [Acetaminophen ] Other (See Comments)    Told to avoid due to liver issues   Dust Mite Extract    Paroxetine  Other (See Comments)    Stomach issues, "felt "wrong," and GI Intolerance   Pollen Extract    Tramadol  Nausea Only    Medications: Prior to Admission medications   Medication Sig Start Date End Date Taking? Authorizing Provider  albuterol  (VENTOLIN  HFA) 108 (90 Base) MCG/ACT inhaler Inhale 2 puffs into the lungs every 6 (six) hours as needed for shortness of breath 04/11/22  Yes   amitriptyline  (ELAVIL ) 10 MG tablet Take 1 tablet (10 mg total) by mouth at bedtime. 09/15/22  Yes   ammonium lactate  (LAC-HYDRIN ) 12 % lotion Apply topically as needed for dry skin. Twice a day 03/02/23  Yes    Baclofen  5 MG TABS Take 2 tablets (10 mg total) by mouth 3 (three) times daily. 02/02/23  Yes Drazek, Dawn, CRNP  bismuth subsalicylate (PEPTO BISMOL) 262 MG/15ML suspension Take 30 mLs by mouth every 6 (six) hours as needed for indigestion.   Yes [provider]  clobetasol  ointment (TEMOVATE ) 0.05 % Apply topically 2 (two) times a day. To affected area (right hand) 03/02/23  Yes   diphenhydrAMINE  (BENADRYL ) 50 MG tablet Take 50 mg by mouth every 8 (eight) hours as needed for itching or allergies.   Yes [provider]  docusate sodium  (COLACE) 100 MG capsule Take 200 mg by mouth daily as needed (constipation.).   Yes [provider]  lactulose  (CHRONULAC ) 10 GM/15ML solution Take 3.33 g by mouth 3 (three) times daily.   Yes [provider]  magnesium  oxide (MAG-OX) 400 (240  Mg) MG tablet Take 400 mg by mouth daily.   Yes [provider]  midodrine  (PROAMATINE ) 5 MG tablet Take 1 tablet (5 mg total) by mouth 3 (three) times daily with meals. 06/11/23 07/11/23 Yes Pahwani, Martha Slack, MD  ondansetron  (ZOFRAN -ODT) 8 MG disintegrating tablet Take 1 tablet (8 mg total) by mouth 3 (three) times daily as needed for nausea. 06/07/22  Yes   pantoprazole  (PROTONIX ) 40 MG tablet Take 1 tablet (40 mg total) by mouth 2 (two) times daily. 06/29/23  Yes Elois Hair, MD  potassium chloride  SA (KLOR-CON  M) 20 MEQ tablet Take 2 tablets (40 mEq total) by mouth daily. 09/26/22  Yes Elois Hair, MD  silver  sulfADIAZINE  (SILVADENE ) 1 % cream Apply topically 2 (two) times a day. To affected areas Patient taking differently: Apply 1 Application topically 2 (two) times daily as needed (for burn, itch). 03/02/23  Yes   spironolactone  (ALDACTONE ) 100 MG tablet Take 2 tablets (200 mg total) by mouth daily. 05/24/23  Yes Vada Garibaldi, MD  sucralfate  (CARAFATE ) 1 g tablet Take 1 tablet (1 g total) by mouth 4 (four) times daily -  with meals and at bedtime. Please dissolve the  tablet in a tablespoon of water and drink the liquid 4 times a day 06/06/23  Yes Elois Hair, MD  torsemide  (DEMADEX ) 20 MG tablet Take 2 tablets (40 mg total) by mouth daily. 05/25/23 07/03/23 Yes Vada Garibaldi, MD    I have reviewed the patient's current medications.  Labs: Renal Panel: Recent Labs  Lab 07/03/23 1354 07/04/23 0551 07/04/23 1701 07/05/23 0356 07/05/23 0954  NA 120* 121* 121* 122* 122*  K 4.6 4.7 5.0 4.9 5.0  CL 90* 92* 91* 93* 93*  CO2 19* 20* 20* 19* 19*  GLUCOSE 104* 98 94 96 93  BUN 17 19 21* 19 22*  CREATININE 1.79* 1.92* 2.16* 2.15* 2.32*  CALCIUM  8.7* 9.0 9.0 8.9 8.8*     CBC:    Latest Ref Rng & Units 07/05/2023    3:56 AM 07/04/2023    5:51 AM 07/03/2023    1:54 PM  CBC  WBC 4.0 - 10.5 K/uL 8.3  6.1  6.6   Hemoglobin 13.0 - 17.0 g/dL 9.5  9.1  9.8   Hematocrit 39.0 - 52.0 % 26.9  25.2  28.1   Platelets 150 - 400 K/uL 107  100  123      Anemia Panel:  Recent Labs    09/22/22 1034 10/19/22 1213 06/08/23 0646 06/09/23 0348 07/03/23 1354 07/04/23 0551 07/05/23 0356  HGB  --    < > 9.4* 8.5* 9.8* 9.1* 9.5*  MCV  --    < > 93.6 97.3 89.2 88.4 88.8  VITAMINB12 544  --   --   --   --   --   --   FOLATE 10.9  --   --   --   --   --   --   FERRITIN 754.8*  --   --   --   --   --   --   TIBC 184.8*  --   --   --   --   --   --   IRON 180*  --   --   --   --   --   --    < > = values in this interval not displayed.    Recent Labs  Lab 07/03/23 1354 07/04/23 0551 07/05/23 0954  AST 75* 66* 63*  ALT 50* 48* 46*  ALKPHOS 125 108 115  BILITOT 3.6* 3.6* 2.2*  PROT 6.0* 5.8* 5.8*  ALBUMIN  2.9* 3.0* 2.9*    Lab Results  Component Value Date   HGBA1C 4.9 07/22/2013    ROS:  Pertinent items noted in HPI and remainder of comprehensive ROS otherwise negative.  Physical Exam: Vitals:   07/05/23 1252 07/05/23 1548  BP: 101/81 115/77  Pulse: 90 93  Resp: 18 16  Temp: 97.6 F (36.4 C) 97.7 F (36.5 C)  SpO2: 100% 100%      General exam: Somnolent male, not in distress.  Mild icteric Respiratory system: Reduced breath sound bilateral, no increased work of breathing Cardiovascular system: S1 & S2 heard, RRR.   Gastrointestinal system: Abdomen is distended, soft and nontender.  Central nervous system: Alert awake. Extremities: b/l LE edema++, chronic stasis changes Skin: No rashes, lesions or ulcers Psychiatry: Somnolent.   Assessment/Plan:  # Acute kidney injury likely hepatorenal syndrome and hemodynamic changes due to recent large-volume paracentesis x2.  Foley catheter for urinary tension in ER.  Patient is nonoliguric. UA with no protein, RBCs probably because of the catheter. I will check urine osmolality and urine sodium level. Agree with continuing IV albumin , midodrine  and octreotide .  Blood pressure is gradually improving.  Consider Levophed in ICU if no improvement in renal function next few days. I will consider starting diuretics in next 1 or 2 days depending on the lab results. Strict ins and out and daily lab.  # Decompensated liver cirrhosis with history of hepatic encephalopathy, ascites requiring frequent paracentesis, status post TIPS procedure which is currently occluded.  IR is planning to declot TIPS tomorrow.  GI is following and managing.  # Hyponatremia related with liver cirrhosis/hypervolemic.  Recommend fluid restriction and likely resume diuretics soon.  Checking urine sodium and osmolality.  # Hypotension: BPs improving with midodrine .  Monitor.  Discussed with the patient and his sister at the bedside.  Thank you for the consult, we will continue to follow.  Lacrystal Barbe Lans[place] Planas 07/05/2023, 4:15 PM  [facility].

## 2023-07-06 ENCOUNTER — Encounter (HOSPITAL_COMMUNITY): Payer: Self-pay | Admitting: Internal Medicine

## 2023-07-06 ENCOUNTER — Inpatient Hospital Stay (HOSPITAL_COMMUNITY): Payer: BLUE CROSS/BLUE SHIELD

## 2023-07-06 ENCOUNTER — Inpatient Hospital Stay (HOSPITAL_COMMUNITY): Payer: Medicaid Other

## 2023-07-06 DIAGNOSIS — N179 Acute kidney failure, unspecified: Secondary | ICD-10-CM | POA: Diagnosis not present

## 2023-07-06 DIAGNOSIS — I50810 Right heart failure, unspecified: Secondary | ICD-10-CM

## 2023-07-06 DIAGNOSIS — Q211 Atrial septal defect, unspecified: Secondary | ICD-10-CM | POA: Diagnosis not present

## 2023-07-06 DIAGNOSIS — K7460 Unspecified cirrhosis of liver: Secondary | ICD-10-CM | POA: Diagnosis not present

## 2023-07-06 DIAGNOSIS — K7290 Hepatic failure, unspecified without coma: Secondary | ICD-10-CM | POA: Diagnosis not present

## 2023-07-06 DIAGNOSIS — E871 Hypo-osmolality and hyponatremia: Secondary | ICD-10-CM | POA: Diagnosis not present

## 2023-07-06 HISTORY — PX: IR TIPS REVISION MOD SED: IMG2296

## 2023-07-06 HISTORY — PX: IR US GUIDE VASC ACCESS RIGHT: IMG2390

## 2023-07-06 LAB — ECHOCARDIOGRAM COMPLETE BUBBLE STUDY
Area-P 1/2: 2.99 cm2
S' Lateral: 3 cm

## 2023-07-06 LAB — COMPREHENSIVE METABOLIC PANEL
ALT: 38 U/L (ref 0–44)
AST: 47 U/L — ABNORMAL HIGH (ref 15–41)
Albumin: 3.7 g/dL (ref 3.5–5.0)
Alkaline Phosphatase: 96 U/L (ref 38–126)
Anion gap: 10 (ref 5–15)
BUN: 25 mg/dL — ABNORMAL HIGH (ref 6–20)
CO2: 20 mmol/L — ABNORMAL LOW (ref 22–32)
Calcium: 8.9 mg/dL (ref 8.9–10.3)
Chloride: 92 mmol/L — ABNORMAL LOW (ref 98–111)
Creatinine, Ser: 2.29 mg/dL — ABNORMAL HIGH (ref 0.61–1.24)
GFR, Estimated: 34 mL/min — ABNORMAL LOW (ref >60)
Glucose, Bld: 89 mg/dL (ref 70–99)
Potassium: 5.4 mmol/L — ABNORMAL HIGH (ref 3.5–5.1)
Sodium: 122 mmol/L — ABNORMAL LOW (ref 135–145)
Total Bilirubin: 2.7 mg/dL — ABNORMAL HIGH (ref 0.0–1.2)
Total Protein: 5.9 g/dL — ABNORMAL LOW (ref 6.5–8.1)

## 2023-07-06 LAB — CBC
HCT: 24.3 % — ABNORMAL LOW (ref 39.0–52.0)
Hemoglobin: 8.4 g/dL — ABNORMAL LOW (ref 13.0–17.0)
MCH: 31.1 pg (ref 26.0–34.0)
MCHC: 34.6 g/dL (ref 30.0–36.0)
MCV: 90 fL (ref 80.0–100.0)
Platelets: 88 10*3/uL — ABNORMAL LOW (ref 150–400)
RBC: 2.7 MIL/uL — ABNORMAL LOW (ref 4.22–5.81)
RDW: 15.2 % (ref 11.5–15.5)
WBC: 7 10*3/uL (ref 4.0–10.5)
nRBC: 0 % (ref 0.0–0.2)

## 2023-07-06 LAB — AMMONIA: Ammonia: 33 umol/L (ref 9–35)

## 2023-07-06 LAB — PROTIME-INR
INR: 1.7 — ABNORMAL HIGH (ref 0.8–1.2)
Prothrombin Time: 20.4 s — ABNORMAL HIGH (ref 11.4–15.2)

## 2023-07-06 LAB — BRAIN NATRIURETIC PEPTIDE: B Natriuretic Peptide: 134.4 pg/mL — ABNORMAL HIGH (ref 0.0–100.0)

## 2023-07-06 MED ORDER — IOHEXOL 300 MG/ML  SOLN: 150.0000 mL | Freq: Once | INTRAMUSCULAR | Status: DC | PRN

## 2023-07-06 MED ORDER — SODIUM CHLORIDE 0.9 % IV SOLN
INTRAVENOUS | Status: AC
  Filled 2023-07-06: qty 20

## 2023-07-06 MED ORDER — MIDAZOLAM HCL 2 MG/2ML IJ SOLN
INTRAMUSCULAR | Status: AC | PRN
  Administered 2023-07-06: 1 mg via INTRAVENOUS

## 2023-07-06 MED ORDER — ALBUMIN HUMAN 25 % IV SOLN: 25.0000 g | Freq: Once | INTRAVENOUS | Status: DC

## 2023-07-06 MED ORDER — LIDOCAINE-EPINEPHRINE 1 %-1:100000 IJ SOLN
20.0000 mL | Freq: Once | INTRAMUSCULAR | Status: AC
  Administered 2023-07-06: 7 mL via INTRADERMAL
  Filled 2023-07-06: qty 20

## 2023-07-06 MED ORDER — MIDAZOLAM HCL 2 MG/2ML IJ SOLN
INTRAMUSCULAR | Status: AC
  Filled 2023-07-06: qty 4

## 2023-07-06 MED ORDER — FENTANYL CITRATE (PF) 100 MCG/2ML IJ SOLN
INTRAMUSCULAR | Status: AC | PRN
  Administered 2023-07-06: 50 ug via INTRAVENOUS
  Administered 2023-07-06: 25 ug via INTRAVENOUS

## 2023-07-06 MED ORDER — LACTULOSE 10 GM/15ML PO SOLN
30.0000 g | Freq: Four times a day (QID) | ORAL | Status: DC
  Administered 2023-07-06 – 2023-07-09 (×10): 30 g via ORAL
  Filled 2023-07-06 (×11): qty 45

## 2023-07-06 MED ORDER — FENTANYL CITRATE (PF) 100 MCG/2ML IJ SOLN
INTRAMUSCULAR | Status: AC
  Filled 2023-07-06: qty 4

## 2023-07-06 MED ORDER — LIDOCAINE-EPINEPHRINE 1 %-1:100000 IJ SOLN
INTRAMUSCULAR | Status: AC
  Filled 2023-07-06: qty 1

## 2023-07-06 MED ORDER — FUROSEMIDE 10 MG/ML IJ SOLN
40.0000 mg | Freq: Two times a day (BID) | INTRAMUSCULAR | Status: DC
  Administered 2023-07-06 – 2023-07-10 (×9): 40 mg via INTRAVENOUS
  Filled 2023-07-06 (×9): qty 4

## 2023-07-06 NOTE — Plan of Care (Signed)
  Problem: Education: Goal: Knowledge of General Education information will improve Description: Including pain rating scale, medication(s)/side effects and non-pharmacologic comfort measures Outcome: Completed/Met

## 2023-07-06 NOTE — Progress Notes (Signed)
Daily Progress Note  DOA: 07/03/2023 Hospital Day: 4   Chief Complaint:  Decompensated cirrhosis  ASSESSMENT    Brief Narrative:  Erik Howe is a 53 y.o. year old male with a history of Etoh abuse, decompensated cirrhosis followed by [facility], chronic loose stool, cholecystectomy, multiple polyps including an advanced polyp(s), colon AMV, NSAID induced duodenal ulcers, severe esophagitis, hiatal hernia . Admitted with decompensated cirrhosis and weakness. Found to have occluded TIPS  Decompensated MASH cirrhosis s/p TIPS.   Admitted with abdominal pain / SHOB , large volume ascites, TIPS occlusion.  MELD 3.0: 30 at 07/05/2023  9:54 AM. Thrombectomy and TIPS revision wasn't able to be done today. Needs Cardiology evaluation. The.mean right atrial pressures were discordant with echocardiogram from 07/08/22. IR recommended right heart catheterization for further evaluation.   AKI , secondary to intravascular volume depletion post TIPS x 2, diuretics and HRS.   Nephrology following.  Urine Na < 10 Cr stable overnight at 2.29  Abdominal distention. Not all due to ascites as abdominal exam still suggests a lot of gaseous distention.   GERD  Principal Problem:   Decompensated cirrhosis (HCC) Active Problems:   Hyponatremia   AKI (acute kidney injury) (HCC)   PLAN   --Continue Xifaxan --Continue Lactulose  --Nephrology following .Got IV albumin yesterday. Holding off on giving today, albumin is > 3.5. Lasix starting. Getting Midodrine and Octreotide --Cardiology will be evaluating.   Subjective   Having BMs No abdominal pain at present.    Objective   1375 ml urine op yesterday  Recent Labs    07/04/23 0551 07/05/23 0356 07/06/23 0353  WBC 6.1 8.3 7.0  HGB 9.1* 9.5* 8.4*  HCT 25.2* 26.9* 24.3*  PLT 100* 107* 88*   BMET Recent Labs    07/05/23 0356 07/05/23 0954 07/06/23 0353  NA 122* 122* 122*  K 4.9 5.0 5.4*  CL 93* 93* 92*  CO2 19* 19* 20*   GLUCOSE 96 93 89  BUN 19 22* 25*  CREATININE 2.15* 2.32* 2.29*  CALCIUM 8.9 8.8* 8.9   LFT Recent Labs    07/06/23 0353  PROT 5.9*  ALBUMIN 3.7  AST 47*  ALT 38  ALKPHOS 96  BILITOT 2.7*   PT/INR Recent Labs    07/05/23 0954 07/06/23 0353  LABPROT 18.3* 20.4*  INR 1.5* 1.7*     Imaging:  DG CHEST PORT 1 VIEW CLINICAL DATA:  Decompensated cirrhosis.  EXAM: PORTABLE CHEST 1 VIEW  COMPARISON:  Radiograph yesterday  FINDINGS: Stable heart size and mediastinal contours. Improvement in interstitial opacities. Small left pleural effusion on CT earlier today is not well seen by radiograph. No focal airspace disease. No pneumothorax.  IMPRESSION: Improvement in interstitial opacities from yesterday. Small left pleural effusion on CT earlier today is not well seen by radiograph  Electronically Signed   By: Narda Rutherford M.D.   On: 07/04/2023 23:03 CT VENOGRAM ABDOMEN PELVIS CLINICAL DATA:  Bowel obstruction suspected Question occlusion of TIPS  EXAM: CT VENOGRAM ABDOMEN AND PELVIS  TECHNIQUE: Venographic phase images of the abdomen and pelvis were obtained following the administration of intravenous contrast. Multiplanar reformats and maximum intensity projections were generated.  RADIATION DOSE REDUCTION: This exam was performed according to the departmental dose-optimization program which includes automated exposure control, adjustment of the mA and/or kV according to patient size and/or use of iterative reconstruction technique.  CONTRAST:  OMNIPAQUE IOHEXOL 350 MG/ML SOLN  COMPARISON:  Hepatic Doppler earlier today demonstrating  occluded TIPS. CT 05/19/2023  FINDINGS: Veins: Majority of the TIPS demonstrates no contrast opacification consistent with occlusion. There may be minimal contrast within the hepatic and portal venous aspects, however the majority of the stent is occluded. The extrahepatic portal vein appears patent. The splenic  vein is patent. The renal veins are patent. The IVC and iliac veins are patent. No filling defects in the SMV or IMV.  Lower chest: Small left and trace right pleural effusion.  Hepatobiliary: Cirrhotic hepatic morphology. No evidence of focal liver lesion. Clips in the gallbladder fossa postcholecystectomy. No biliary dilatation.  Pancreas: No pancreatic ductal dilatation. No evidence of pancreatic mass. There is generalized edema of the mesenteric fat which is not disproportionately affecting the pancreas.  Spleen: No splenomegaly.  Greatest splenic length is 12.4 cm.  Adrenals/Urinary Tract: No adrenal nodule. No hydronephrosis or renal calculi. Low-density lesions in the left kidney are too small to characterize. No further follow-up imaging is recommended. Foley catheter decompresses the urinary bladder.  Stomach/Bowel: Detailed bowel assessment is limited in the presence of ascites and lack contrast. There are paraesophageal and perigastric varices. Ingested material distends the stomach. No small bowel distention or evidence of obstruction. Normal appendix visualized. Wall thickening of the ascending and proximal transverse colon may be due to portal colopathy. Mild gaseous distension of transverse colon. Sigmoid colon is redundant. Small volume of formed colonic stool.  Vascular/Lymphatic: Aortic and branch atherosclerosis. No aneurysm. There are prominent portal caval nodes are likely reactive.  Reproductive: Prostate is unremarkable.  Other: Moderate volume abdominopelvic ascites. Ascites tracks into an umbilical hernia. Generalized edema of the intra-abdominal and subcutaneous fat. No free intra-abdominal air.  Musculoskeletal: The bones are diffusely under mineralized. Scattered Schmorl's nodes in the spine. There are no acute or suspicious osseous abnormalities.  IMPRESSION: 1. CT confirms occluded TIPS. 2. Cirrhosis with moderate ascites.  Paraesophageal  varices. 3. Wall thickening of the cecum and ascending colon may represent portal colopathy. 4. No bowel obstruction.  Aortic Atherosclerosis (ICD10-I70.0).  Electronically Signed   By: Narda Rutherford M.D.   On: 07/04/2023 19:33 IR Paracentesis INDICATION: Patient with a history of cirrhosis with recurrent ascites. Interventional radiology asked to perform a therapeutic paracentesis.  EXAM: ULTRASOUND GUIDED PARACENTESIS  MEDICATIONS: 1% lidocaine 20 mL  COMPLICATIONS: None immediate.  PROCEDURE: Informed written consent was obtained from the patient after a discussion of the risks, benefits and alternatives to treatment. A timeout was performed prior to the initiation of the procedure.  Initial ultrasound scanning demonstrates a large amount of ascites within the right lower abdominal quadrant. The right lower abdomen was prepped and draped in the usual sterile fashion. 1% lidocaine was used for local anesthesia.  Following this, a 19 gauge, 10-cm, Yueh catheter was introduced. An ultrasound image was saved for documentation purposes. The paracentesis was performed. The catheter was removed and a dressing was applied. The patient tolerated the procedure well without immediate post procedural complication. Patient received post-procedure intravenous albumin; see nursing notes for details.  FINDINGS: A total of approximately 6.1 L of clear yellow fluid was removed.  IMPRESSION: Successful ultrasound-guided paracentesis yielding 6.1 liters of peritoneal fluid.  Procedure performed by Alwyn Ren NP  PLAN: The patient is enrolled in the [facility] Interventional Radiology Portal Hypertension Clinic and is being actively followed.  He is status post TIPS on 07/20/2022 with Dr. Malachi Pro, MD  [facility]  [facility] Radiology  Electronically Signed   By: Gerome Sam.D.  On: 07/04/2023 13:52 US  ABDOMEN LIMITED WITH LIVER DOPPLER CLINICAL DATA:  Decompensated cirrhosis, post TIPS creation 07/20/2022  EXAM: DUPLEX ULTRASOUND OF LIVER  TECHNIQUE: Color and duplex Doppler ultrasound was performed to evaluate the hepatic in-flow and out-flow vessels.  COMPARISON:  None Available.  FINDINGS: Liver: Small liver with nodular contour.  No focal lesion, mass or intrahepatic biliary ductal dilatation.  Main Portal Vein size: 0.6 cm cm  Portal Vein Velocities (hepatopetal):  Main Prox:  17 cm/sec  Main Mid: 22 cm/sec  Main Dist:  19 cm/sec Right: Limited visualization, flow signal in the proximal aspect  TIPS stent is identified, with no internal flow or color Doppler signal in its mid and hepatic vein components.  Hepatic Vein Velocities (hepatofugal):  Right:  27 cm/sec  Middle:  28 cm/sec  Left:  23 cm/sec  IVC: Present and patent with normal respiratory phasicity. Velocity 24 cm/sec.  Hepatic Artery Velocity:  89 cm/sec  Splenic Vein Velocity:  22 cm/sec  Spleen: 11.7 cm x 10.9 cm x 3.8 cm with a total volume of 252 cm^3 (411 cm^3 is upper limit normal)  Portal Vein Occlusion/Thrombus: No  Splenic Vein Occlusion/Thrombus: No  Ascites: Small to moderate  Varices: None  IMPRESSION: 1. Occluded TIPS stent with no internal flow or color Doppler signal in its mid and hepatic vein components. 2. Small liver with nodular contour. 3. Small to moderate ascites.  Electronically Signed   By: Corlis Leak M.D.   On: 07/04/2023 13:11     Scheduled inpatient medications:   amitriptyline  10 mg Oral QHS   Chlorhexidine Gluconate Cloth  6 each Topical Daily   feeding supplement  237 mL Oral BID BM   furosemide  40 mg Intravenous Q12H   lactulose  30 g Oral QID   midodrine  10 mg Oral TID WC   pantoprazole  40 mg Oral BID   rifaximin  550 mg Oral BID   Continuous inpatient infusions:   cefTRIAXone (ROCEPHIN)  IV     octreotide (SANDOSTATIN) 500 mcg in  sodium chloride 0.9 % 250 mL (2 mcg/mL) infusion 50 mcg/hr (07/06/23 1007)   PRN inpatient medications: acetaminophen **OR** acetaminophen, iohexol, ondansetron **OR** ondansetron (ZOFRAN) IV, mouth rinse, oxyCODONE  Vital signs in last 24 hours: Temp:  [97.7 F (36.5 C)-98.3 F (36.8 C)] 98.3 F (36.8 C) (01/16 0526) Pulse Rate:  [87-93] 91 (01/16 0935) Resp:  [7-20] 8 (01/16 0935) BP: (102-126)/(51-80) 125/73 (01/16 0935) SpO2:  [99 %-100 %] 100 % (01/16 0935) Last BM Date : 07/05/23  Intake/Output Summary (Last 24 hours) at 07/06/2023 1409 Last data filed at 07/06/2023 0644 Gross per 24 hour  Intake 819.67 ml  Output 1375 ml  Net -555.33 ml    Intake/Output from previous day: 01/15 0701 - 01/16 0700 In: 939.7 [P.O.:480; I.V.:386.9; IV Piggyback:72.7] Out: 1375 [Urine:1375] Intake/Output this shift: No intake/output data recorded.   Physical Exam:  General: Sleepy male in NAD. Sister at bedside Heart:  Regular rate and rhythm.  Pulmonary: Normal respiratory effort Abdomen: Soft, largely distended, nontender, + tympany. A few bowel sounds. Extremities: 2+ BLE edema  Psych: Pleasant. Cooperative. Insight appears normal.      LOS: 2 days   Willette Cluster ,NP 07/06/2023, 2:09 PM

## 2023-07-06 NOTE — Progress Notes (Signed)
  Echocardiogram 2D Echocardiogram has been performed.  Delcie Roch 07/06/2023, 6:25 PM

## 2023-07-06 NOTE — Plan of Care (Signed)
  Problem: Clinical Measurements: Goal: Diagnostic test results will improve Outcome: Progressing Goal: Respiratory complications will improve Outcome: Progressing Goal: Cardiovascular complication will be avoided Outcome: Progressing   

## 2023-07-06 NOTE — Progress Notes (Addendum)
[facility] NEPHROLOGY PROGRESS NOTE  Assessment/ Plan: Pt is a 53 y.o. yo male  with past medical history significant for hypertension, anxiety depression, alcohol abuse with decompensated liver cirrhosis status post TIPS now undergoing liver transplant evaluation at [facility], cholecystectomy, presented with abdominal pain and worsening ascites, seen as a consultation for further evaluation and management of acute kidney injury.   # Acute kidney injury likely hepatorenal syndrome and hemodynamic changes due to recent large-volume paracentesis x2. Urine Na<10. Foley catheter for urinary retension in ER.  Patient is nonoliguric. UA with no protein, RBCs probably because of the catheter. Treating with IV albumin, midodrine and octreotide.  I will start IV Lasix 40 mg twice a day for anasarca.  Blood pressure is gradually improving.  Consider Levophed in ICU if no improvement in renal function next few days. Strict ins and out and daily lab.   # Decompensated liver cirrhosis with history of hepatic encephalopathy, ascites requiring frequent paracentesis, status post TIPS procedure which is currently occluded.  IR is following for occluded TIPS.  GI is following and managing.   # Hyponatremia related with liver cirrhosis/hypervolemic.  Recommend fluid restriction. Starting diuretics.   # Hypotension: BPs improving with midodrine.  Monitor.  # Hyperkalemia: Diuretics will help.  Subjective: Seen and examined at bedside.  Patient is somnolent however opens eyes when we call him with the name.  Urine output is recorded around 1.3 L and creatinine level is downtrending with current management.  His sister was presented with him. Objective Vital signs in last 24 hours: Vitals:   07/06/23 0920 07/06/23 0925 07/06/23 0930 07/06/23 0935  BP: 126/69 104/63 110/80 125/73  Pulse: 88 92 90 91  Resp: 12 11 (!) 7 (!) 8  Temp:      TempSrc:      SpO2: 100% 99% 100% 100%  Weight:       Height:       Weight change:   Intake/Output Summary (Last 24 hours) at 07/06/2023 1135 Last data filed at 07/06/2023 0644 Gross per 24 hour  Intake 819.67 ml  Output 1375 ml  Net -555.33 ml       Labs: RENAL PANEL Recent Labs  Lab 07/03/23 1354 07/04/23 0551 07/04/23 1701 07/05/23 0356 07/05/23 0954 07/06/23 0353  NA 120* 121* 121* 122* 122* 122*  K 4.6 4.7 5.0 4.9 5.0 5.4*  CL 90* 92* 91* 93* 93* 92*  CO2 19* 20* 20* 19* 19* 20*  GLUCOSE 104* 98 94 96 93 89  BUN 17 19 21* 19 22* 25*  CREATININE 1.79* 1.92* 2.16* 2.15* 2.32* 2.29*  CALCIUM 8.7* 9.0 9.0 8.9 8.8* 8.9  ALBUMIN 2.9* 3.0*  --   --  2.9* 3.7    Liver Function Tests: Recent Labs  Lab 07/04/23 0551 07/05/23 0954 07/06/23 0353  AST 66* 63* 47*  ALT 48* 46* 38  ALKPHOS 108 115 96  BILITOT 3.6* 2.2* 2.7*  PROT 5.8* 5.8* 5.9*  ALBUMIN 3.0* 2.9* 3.7   No results for input(s): "LIPASE", "AMYLASE" in the last 168 hours. Recent Labs  Lab 07/03/23 1354  AMMONIA 23   CBC: Recent Labs    09/22/22 1034 10/19/22 1213 06/09/23 0348 07/03/23 1354 07/04/23 0551 07/05/23 0356 07/06/23 0353  HGB  --    < > 8.5* 9.8* 9.1* 9.5* 8.4*  MCV  --    < > 97.3 89.2 88.4 88.8 90.0  VITAMINB12 544  --   --   --   --   --   --  FOLATE 10.9  --   --   --   --   --   --   FERRITIN 754.8*  --   --   --   --   --   --   TIBC 184.8*  --   --   --   --   --   --   IRON 180*  --   --   --   --   --   --    < > = values in this interval not displayed.    Cardiac Enzymes: No results for input(s): "CKTOTAL", "CKMB", "CKMBINDEX", "TROPONINI" in the last 168 hours. CBG: No results for input(s): "GLUCAP" in the last 168 hours.  Iron Studies: No results for input(s): "IRON", "TIBC", "TRANSFERRIN", "FERRITIN" in the last 72 hours. Studies/Results: DG CHEST PORT 1 VIEW Result Date: 07/04/2023 CLINICAL DATA:  Decompensated cirrhosis. EXAM: PORTABLE CHEST 1 VIEW COMPARISON:  Radiograph yesterday FINDINGS: Stable heart  size and mediastinal contours. Improvement in interstitial opacities. Small left pleural effusion on CT earlier today is not well seen by radiograph. No focal airspace disease. No pneumothorax. IMPRESSION: Improvement in interstitial opacities from yesterday. Small left pleural effusion on CT earlier today is not well seen by radiograph Electronically Signed   By: Narda Rutherford M.D.   On: 07/04/2023 23:03   CT VENOGRAM ABDOMEN PELVIS Result Date: 07/04/2023 CLINICAL DATA:  Bowel obstruction suspected Question occlusion of TIPS EXAM: CT VENOGRAM ABDOMEN AND PELVIS TECHNIQUE: Venographic phase images of the abdomen and pelvis were obtained following the administration of intravenous contrast. Multiplanar reformats and maximum intensity projections were generated. RADIATION DOSE REDUCTION: This exam was performed according to the departmental dose-optimization program which includes automated exposure control, adjustment of the mA and/or kV according to patient size and/or use of iterative reconstruction technique. CONTRAST:  OMNIPAQUE IOHEXOL 350 MG/ML SOLN COMPARISON:  Hepatic Doppler earlier today demonstrating occluded TIPS. CT 05/19/2023 FINDINGS: Veins: Majority of the TIPS demonstrates no contrast opacification consistent with occlusion. There may be minimal contrast within the hepatic and portal venous aspects, however the majority of the stent is occluded. The extrahepatic portal vein appears patent. The splenic vein is patent. The renal veins are patent. The IVC and iliac veins are patent. No filling defects in the SMV or IMV. Lower chest: Small left and trace right pleural effusion. Hepatobiliary: Cirrhotic hepatic morphology. No evidence of focal liver lesion. Clips in the gallbladder fossa postcholecystectomy. No biliary dilatation. Pancreas: No pancreatic ductal dilatation. No evidence of pancreatic mass. There is generalized edema of the mesenteric fat which is not disproportionately  affecting the pancreas. Spleen: No splenomegaly.  Greatest splenic length is 12.4 cm. Adrenals/Urinary Tract: No adrenal nodule. No hydronephrosis or renal calculi. Low-density lesions in the left kidney are too small to characterize. No further follow-up imaging is recommended. Foley catheter decompresses the urinary bladder. Stomach/Bowel: Detailed bowel assessment is limited in the presence of ascites and lack contrast. There are paraesophageal and perigastric varices. Ingested material distends the stomach. No small bowel distention or evidence of obstruction. Normal appendix visualized. Wall thickening of the ascending and proximal transverse colon may be due to portal colopathy. Mild gaseous distension of transverse colon. Sigmoid colon is redundant. Small volume of formed colonic stool. Vascular/Lymphatic: Aortic and branch atherosclerosis. No aneurysm. There are prominent portal caval nodes are likely reactive. Reproductive: Prostate is unremarkable. Other: Moderate volume abdominopelvic ascites. Ascites tracks into an umbilical hernia. Generalized edema of the intra-abdominal and subcutaneous fat. No  free intra-abdominal air. Musculoskeletal: The bones are diffusely under mineralized. Scattered Schmorl's nodes in the spine. There are no acute or suspicious osseous abnormalities. IMPRESSION: 1. CT confirms occluded TIPS. 2. Cirrhosis with moderate ascites.  Paraesophageal varices. 3. Wall thickening of the cecum and ascending colon may represent portal colopathy. 4. No bowel obstruction. Aortic Atherosclerosis (ICD10-I70.0). Electronically Signed   By: Narda Rutherford M.D.   On: 07/04/2023 19:33   IR Paracentesis Result Date: 07/04/2023 INDICATION: Patient with a history of cirrhosis with recurrent ascites. Interventional radiology asked to perform a therapeutic paracentesis. EXAM: ULTRASOUND GUIDED PARACENTESIS MEDICATIONS: 1% lidocaine 20 mL COMPLICATIONS: None immediate. PROCEDURE: Informed written  consent was obtained from the patient after a discussion of the risks, benefits and alternatives to treatment. A timeout was performed prior to the initiation of the procedure. Initial ultrasound scanning demonstrates a large amount of ascites within the right lower abdominal quadrant. The right lower abdomen was prepped and draped in the usual sterile fashion. 1% lidocaine was used for local anesthesia. Following this, a 19 gauge, 10-cm, Yueh catheter was introduced. An ultrasound image was saved for documentation purposes. The paracentesis was performed. The catheter was removed and a dressing was applied. The patient tolerated the procedure well without immediate post procedural complication. Patient received post-procedure intravenous albumin; see nursing notes for details. FINDINGS: A total of approximately 6.1 L of clear yellow fluid was removed. IMPRESSION: Successful ultrasound-guided paracentesis yielding 6.1 liters of peritoneal fluid. Procedure performed by Alwyn Ren NP PLAN: The patient is enrolled in the [facility] Interventional Radiology Portal Hypertension Clinic and is being actively followed. He is status post TIPS on 07/20/2022 with Dr. Malachi Pro, MD [facility] Radiology Electronically Signed   By: Roanna Banning M.D.   On: 07/04/2023 13:52    Medications: Infusions:  cefTRIAXone (ROCEPHIN)  IV     octreotide (SANDOSTATIN) 500 mcg in sodium chloride 0.9 % 250 mL (2 mcg/mL) infusion 50 mcg/hr (07/06/23 1007)    Scheduled Medications:  amitriptyline  10 mg Oral QHS   Chlorhexidine Gluconate Cloth  6 each Topical Daily   feeding supplement  237 mL Oral BID BM   lactulose  30 g Oral TID   midodrine  10 mg Oral TID WC   pantoprazole  40 mg Oral BID   rifaximin  550 mg Oral BID    have reviewed scheduled and prn medications.  Physical Exam: General: Somnolent male, lying on bed, opens eyes with the name Heart:RRR, s1s2  nl Lungs: Clear anteriorly, no increased work of breathing Abdomen: Soft but distended. Extremities: Bilateral leg peripheral edema presents++ Neurology: Somnolent male [facility] Prasad Faithanne Verret 07/06/2023,11:35 AM  LOS: 2 days

## 2023-07-06 NOTE — Sedation Documentation (Signed)
Procedure stopped per MD Suttle. MD will follow up with patient at a later time.

## 2023-07-06 NOTE — Procedures (Signed)
Interventional Radiology Procedure Note  Procedure:  1) Ultrasound guided vascular access of the left internal jugular vein 2) Central venous manometry  Findings: Please refer to procedural dictation for full description.  Occluded right internal jugular.  Right atrial pressure variable with respiration ranging from 23 to 31 mmHg, multiple measurements obtained.  Procedure aborted as restoration of TIPS patency would cause even further increased right heart pressures.  Left internal jugular 6 Fr access.  Complications: None immediate.  Estimated Blood Loss: < 5 mL  Recommendations: Recommend Cardiology consultation.  Direct mean right atrial pressures are discordant with echocardiogram from 07/08/22.  Would recommend repeat echocardiogram and consideration of formal right heart catheterization for further evaluation.  Ideally, right atrial pressure would be less than a mean of 20 mmHg prior to TIPS creation/thrombectomy to prevent right heart overload.  IR will follow and is available for repeat attempt at TIPS thrombectomy/revision once optimized from cardiology perspective.   Marliss Coots, MD Pager: [phone] Clinic: [phone]

## 2023-07-06 NOTE — Progress Notes (Signed)
Patient's sister Morene Antu requested that when the Doctors come in to speak with Erik Howe could they please call her on speaker phone due to him feeling very overwhelmed and not understanding the entirety of the situation. Her contact number is [phone].

## 2023-07-06 NOTE — Consult Note (Signed)
Cardiology Consultation   Patient ID: CARROL POMPLUN MRN: [id number]; DOB: 03-Jul-1970  Admit date: 07/03/2023 Date of Consult: 07/06/2023  PCP:  [name], PA-C   [facility] Providers Cardiologist:  New (Dr. Rennis Golden) Click here to update MD or APP on Care Team, Refresh:1}     Patient Profile:   Erik Howe is a 53 y.o. 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 Elby Showers (Intervention Radiology).  History of Present Illness:   Erik Howe is a 53 year old male with the above history. No prior cardiac history. 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 He typically required paracentesis once every 2-3 weeks (last one was at the end of 05/2023). Last Echo in 06/2022 showed LVEF of 65-70% with normal wall motion and diastolic parameters, normal RV function, and no significant valvular disease.   He had 2 admissions in 05/2023 for acute hepatic encephalopathy. Last admission was from 06/07/2023 to 06/11/2023. Lactulose was increased at that time. SNF was recommended at that time but family declined.  He presented to the ED on 07/02/2022 for abdominal pain and shortness of breath. Upon arrival to the ED, EKG showed normal sinus rhythm, rate 70 bpm, with possible Q waves in inferior, low voltage QRS in precordial leads, possible Q waves in inferior and lateral leads, T wave inversions in leads III and aVF and non-specific T wave changes in precordial leads. WBC 6.6, Hgb 9.8, Plts 123. Na 120, Glucose 104, BUN 17, Cr 1.79. Albumin 2.9, AST 75, ALT 50, Alk Phos 125, Total Bili 3.6. Ammonia 23. Lactic acid 2.2. Chest x-ray showed slightly increased interstitial markings bilaterally. Abdominal ultrasound showed occluded TIPS stent, small liver with nodular contour, and  small to moderate ascites. He was admitted for decompensated cirrhosis and underwent paracentesis on 1/14 with removal of 6.1 L of fluid. CT venogram on 1/14 confirmed occluded TIPS. He developed some AKI following this with creatinine peaking at 2.32. Nephrology was consulted and this is felt to be due to hepatorenal syndrome and hemodynamic changes after large volume paracentesis.   He was scheduled to undergo revision of TIPS today but this was aborted because of discordance of direct mean right atrial pressure. Right atrial pressure was variable with respiration ranging from 23 to 31 mmHg. Procedure was aborted as restoration of TIPS patency would cause even further increased right heart pressures. Per IR note, right atrial pressure would ideally be less than 20 mmHg prior to TIPS creation/ thrombectomy to prevent right heart overload. Cardiology was consulted for further evaluation of CHF.  Patient states he had sudden onset of profound weakness and abdominal pain 1-2 days prior to admission. He also describes shortness of breath. This was a little different than his usual presentation. He states symptoms usually occur more gradually but these were much more abrupt. He states he usually gets some chest tightness, shortness of breath, and abdominal pain as he is nearing time for paracentesis and then symptoms resolve. He typically gets 6L of fluid removed when he has a paracentesis. He has chronic stable 3 pillow orthopnea but no PND. He has chronic edema but he is unsure if this has been worse than usual. He reports some heart racing today but no other palpitations. Telemetry today shows normal sinus rhythm. He  also reports some dizziness and near syncope when he starts to accumulate fluid but no over syncope. He denies any recent fevers or illnesses. No nausea, vomiting, or diarrhea. No abnormal bleeding in urine or stools.   He has a history of tobacco use but quit several months ago. He reports prior  alcohol use and states he used to drink "sparingly." However, he has not had any alcohol in the last 1-1.5 years. He is currently on the wait list for a liver transplant at [facility].   He does have have family history of cardiac disease. His father had atrial fibrillation and his mother had PAD and an aortic aneurysm which she ultimately died from in her 54s.  Of note, his sister is at bedside and she is his medical decision maker when he is unable to make decisions for himself.  Past Medical History:  Diagnosis Date   Anxiety    Arthritis    Cellulitis 12/18/2012   left foot   Cirrhosis of liver (HCC)    Depression    GERD (gastroesophageal reflux disease)    Hypertension    no longer a problem   Obesity    Varicose veins     Past Surgical History:  Procedure Laterality Date   BIOPSY  08/25/2022   Procedure: BIOPSY;  Surgeon: Jenel Lucks, MD;  Location: [facility];  Service: Gastroenterology;;   CHOLECYSTECTOMY     COLONOSCOPY WITH PROPOFOL N/A 08/25/2022   Procedure: COLONOSCOPY WITH PROPOFOL;  Surgeon: Jenel Lucks, MD;  Location: [facility];  Service: Gastroenterology;  Laterality: N/A;   ENDOVENOUS ABLATION SAPHENOUS VEIN W/ LASER Left 09-30-2013   left greater saphenous vein   ESOPHAGOGASTRODUODENOSCOPY (EGD) WITH PROPOFOL N/A 08/25/2022   Procedure: ESOPHAGOGASTRODUODENOSCOPY (EGD) WITH PROPOFOL;  Surgeon: Jenel Lucks, MD;  Location: [facility];  Service: Gastroenterology;  Laterality: N/A;   HOT HEMOSTASIS N/A 08/25/2022   Procedure: HOT HEMOSTASIS (ARGON PLASMA COAGULATION/BICAP);  Surgeon: Jenel Lucks, MD;  Location: [facility];  Service: Gastroenterology;  Laterality: N/A;   IR IVUS EACH ADDITIONAL NON CORONARY VESSEL  07/20/2022   IR PARACENTESIS  05/03/2022   IR PARACENTESIS  05/18/2022   IR PARACENTESIS  06/02/2022   IR PARACENTESIS  06/10/2022   IR PARACENTESIS  06/24/2022   IR PARACENTESIS  06/30/2022   IR PARACENTESIS  07/08/2022   IR  PARACENTESIS  07/15/2022   IR PARACENTESIS  07/20/2022   IR PARACENTESIS  07/29/2022   IR PARACENTESIS  08/12/2022   IR PARACENTESIS  08/19/2022   IR PARACENTESIS  09/06/2022   IR PARACENTESIS  09/22/2022   IR PARACENTESIS  10/21/2022   IR PARACENTESIS  11/17/2022   IR PARACENTESIS  12/07/2022   IR PARACENTESIS  12/21/2022   IR PARACENTESIS  01/06/2023   IR PARACENTESIS  01/23/2023   IR PARACENTESIS  02/23/2023   IR PARACENTESIS  03/09/2023   IR PARACENTESIS  03/31/2023   IR PARACENTESIS  05/01/2023   IR PARACENTESIS  05/16/2023   IR PARACENTESIS  06/19/2023   IR PARACENTESIS  07/04/2023   IR TIPS  07/20/2022   IR US GUIDE VASC ACCESS RIGHT  07/20/2022   IR US GUIDE VASC ACCESS RIGHT  07/20/2022   POLYPECTOMY  08/25/2022   Procedure: POLYPECTOMY;  Surgeon: Jenel Lucks, MD;  Location: [facility];  Service: Gastroenterology;;   RADIOLOGY WITH ANESTHESIA N/A 07/20/2022   Procedure: TIPS;  Surgeon: Richarda Overlie, MD;  Location: [facility];  Service: Radiology;  Laterality: N/A;  Home Medications:  Prior to Admission medications   Medication Sig Start Date End Date Taking? Authorizing Provider  albuterol (VENTOLIN HFA) 108 (90 Base) MCG/ACT inhaler Inhale 2 puffs into the lungs every 6 (six) hours as needed for shortness of breath 04/11/22  Yes   amitriptyline (ELAVIL) 10 MG tablet Take 1 tablet (10 mg total) by mouth at bedtime. 09/15/22  Yes   ammonium lactate (LAC-HYDRIN) 12 % lotion Apply topically as needed for dry skin. Twice a day 03/02/23  Yes   Baclofen 5 MG TABS Take 2 tablets (10 mg total) by mouth 3 (three) times daily. 02/02/23  Yes Drazek, Dawn, CRNP  bismuth subsalicylate (PEPTO BISMOL) 262 MG/15ML suspension Take 30 mLs by mouth every 6 (six) hours as needed for indigestion.   Yes [provider]  clobetasol ointment (TEMOVATE) 0.05 % Apply topically 2 (two) times a day. To affected area (right hand) 03/02/23  Yes   diphenhydrAMINE (BENADRYL) 50 MG tablet Take 50 mg by mouth every 8  (eight) hours as needed for itching or allergies.   Yes [provider]  docusate sodium (COLACE) 100 MG capsule Take 200 mg by mouth daily as needed (constipation.).   Yes [provider]  lactulose (CHRONULAC) 10 GM/15ML solution Take 3.33 g by mouth 3 (three) times daily.   Yes [provider]  magnesium oxide (MAG-OX) 400 (240 Mg) MG tablet Take 400 mg by mouth daily.   Yes [provider]  midodrine (PROAMATINE) 5 MG tablet Take 1 tablet (5 mg total) by mouth 3 (three) times daily with meals. 06/11/23 07/11/23 Yes Pahwani, Daleen Bo, MD  ondansetron (ZOFRAN-ODT) 8 MG disintegrating tablet Take 1 tablet (8 mg total) by mouth 3 (three) times daily as needed for nausea. 06/07/22  Yes   pantoprazole (PROTONIX) 40 MG tablet Take 1 tablet (40 mg total) by mouth 2 (two) times daily. 06/29/23  Yes Jenel Lucks, MD  potassium chloride SA (KLOR-CON M) 20 MEQ tablet Take 2 tablets (40 mEq total) by mouth daily. 09/26/22  Yes Jenel Lucks, MD  silver sulfADIAZINE (SILVADENE) 1 % cream Apply topically 2 (two) times a day. To affected areas Patient taking differently: Apply 1 Application topically 2 (two) times daily as needed (for burn, itch). 03/02/23  Yes   spironolactone (ALDACTONE) 100 MG tablet Take 2 tablets (200 mg total) by mouth daily. 05/24/23  Yes Dorcas Carrow, MD  sucralfate (CARAFATE) 1 g tablet Take 1 tablet (1 g total) by mouth 4 (four) times daily -  with meals and at bedtime. Please dissolve the tablet in a tablespoon of water and drink the liquid 4 times a day 06/06/23  Yes Jenel Lucks, MD  torsemide (DEMADEX) 20 MG tablet Take 2 tablets (40 mg total) by mouth daily. 05/25/23 07/03/23 Yes Dorcas Carrow, MD    Inpatient Medications: Scheduled Meds:  amitriptyline  10 mg Oral QHS   Chlorhexidine Gluconate Cloth  6 each Topical Daily   feeding supplement  237 mL Oral BID BM   furosemide  40 mg Intravenous Q12H   lactulose  30 g Oral QID    midodrine  10 mg Oral TID WC   pantoprazole  40 mg Oral BID   rifaximin  550 mg Oral BID   Continuous Infusions:  cefTRIAXone (ROCEPHIN)  IV     octreotide (SANDOSTATIN) 500 mcg in sodium chloride 0.9 % 250 mL (2 mcg/mL) infusion 50 mcg/hr (07/06/23 1007)   PRN Meds: acetaminophen **OR** acetaminophen, iohexol, ondansetron **OR**  ondansetron (ZOFRAN) IV, mouth rinse, oxyCODONE  Allergies:    Allergies  Allergen Reactions   Tylenol [Acetaminophen] Other (See Comments)    Told to avoid due to liver issues   Dust Mite Extract    Paroxetine Other (See Comments)    Stomach issues, "felt "wrong," and GI Intolerance   Pollen Extract    Tramadol Nausea Only    Social History:   Social History   Socioeconomic History   Marital status: Single    Spouse name: Not on file   Number of children: Not on file   Years of education: Not on file   Highest education level: Not on file  Occupational History   Not on file  Tobacco Use   Smoking status: Former    Current packs/day: 0.00    Types: Cigarettes    Quit date: 09/18/2012    Years since quitting: 10.8   Smokeless tobacco: Never  Vaping Use   Vaping status: Never Used  Substance and Sexual Activity   Alcohol use: No   Drug use: No   Sexual activity: Not on file  Other Topics Concern   Not on file  Social History Narrative   Not on file   Social Drivers of Health   Financial Resource Strain: Not on File (10/07/2021)   Received from [employer], [employer]    Financial Resource Strain: 0  Food Insecurity: No Food Insecurity (07/04/2023)   Hunger Vital Sign    Worried About Running Out of Food in the Last Year: Never true    Ran Out of Food in the Last Year: Never true  Transportation Needs: Patient Unable To Answer (07/04/2023)   PRAPARE - Transportation    Lack of Transportation (Medical): Patient unable to answer    Lack of Transportation (Non-Medical): Patient unable to answer  Physical Activity: Not  on File (10/07/2021)   Received from [place], [place]   Physical Activity    Physical Activity: 0  Stress: Not on File (10/07/2021)   Received from [facility], [place]   Stress    Stress: 0  Social Connections: Not on File (03/02/2023)   Received from [facility]   Social Connections    Connectedness: 0  Intimate Partner Violence: Patient Declined (07/04/2023)   Humiliation, Afraid, Rape, and Kick questionnaire    Fear of Current or Ex-Partner: Patient declined    Emotionally Abused: Patient declined    Physically Abused: Patient declined    Sexually Abused: Patient declined    Family History:   Family History  Problem Relation Age of Onset   Varicose Veins Mother    Heart disease Mother    Diabetes Mother    Arthritis Mother    Hypertension Mother    Stroke Father    Heart disease Father    Colon cancer Neg Hx    Stomach cancer Neg Hx    Rectal cancer Neg Hx    Esophageal cancer Neg Hx      ROS:  Please see the history of present illness.  All other ROS reviewed and negative.     Physical Exam/Data:   Vitals:   07/06/23 0920 07/06/23 0925 07/06/23 0930 07/06/23 0935  BP: 126/69 104/63 110/80 125/73  Pulse: 88 92 90 91  Resp: 12 11 (!) 7 (!) 8  Temp:      TempSrc:      SpO2: 100% 99% 100% 100%  Weight:      Height:  Intake/Output Summary (Last 24 hours) at 07/06/2023 1357 Last data filed at 07/06/2023 0644 Gross per 24 hour  Intake 819.67 ml  Output 1375 ml  Net -555.33 ml      07/04/2023    3:02 PM 07/04/2023    1:42 PM 06/11/2023    5:00 AM  Last 3 Weights  Weight (lbs) 278 lb 260 lb 2.3 oz 260 lb 2.3 oz  Weight (kg) 126.1 kg 118 kg 118 kg     Body mass index is 36.68 kg/m.  General: 53 y.o. obese Caucasian male resting comfortably in no acute distress. Jaundiced and somnolent. HEENT: Normocephalic and atraumatic. Sclera clear. EOMs intact. Neck: Supple. JVD difficult to assess due to body habitus but no significant elevated. Heart: RRR. No murmurs, gallops,  or rubs.  Lungs: No increased work of breathing. Clear to ausculation bilaterally. No wheezes, rhonchi, or rales.  Abdomen: Soft, distended, and tender to palpation.  Extremities: 2+ pitting edema of bilateral lower extremities with chronic skin changes consistent with chronic venous insufficiency. Skin: Jaundice.  Neuro:  No focal deficits.Somnolent.  EKG:  The EKG was personally reviewed and demonstrates:  Normal sinus rhythm, rate 70 bpm, with possible Q waves in inferior, low voltage QRS in precordial leads, possible Q waves in inferior and lateral leads, T wave inversions in leads III and aVF and non-specific T wave changes in precordial leads.  Telemetry:  Telemetry was personally reviewed and demonstrates:  Normal sinus rhythm with rates 80s to 90s.  Relevant CV Studies:  Echocardiogram 07/08/2022: Impressions: 1. Left ventricular ejection fraction, by estimation, is 65 to 70%. The  left ventricle has normal function. The left ventricle has no regional  wall motion abnormalities. Left ventricular diastolic parameters were  normal.   2. Right ventricular systolic function is normal. The right ventricular  size is normal.   3. The mitral valve is normal in structure. No evidence of mitral valve  regurgitation. No evidence of mitral stenosis.   4. The aortic valve is tricuspid. There is mild calcification of the  aortic valve. There is mild thickening of the aortic valve. Aortic valve  regurgitation is not visualized. Aortic valve sclerosis is present, with  no evidence of aortic valve stenosis.   5. The inferior vena cava is normal in size with greater than 50%  respiratory variability, suggesting right atrial pressure of 3 mmHg.   Laboratory Data:  High Sensitivity Troponin:  No results for input(s): "TROPONINIHS" in the last 720 hours.   Chemistry Recent Labs  Lab 07/05/23 0356 07/05/23 0954 07/06/23 0353  NA 122* 122* 122*  K 4.9 5.0 5.4*  CL 93* 93* 92*  CO2 19* 19*  20*  GLUCOSE 96 93 89  BUN 19 22* 25*  CREATININE 2.15* 2.32* 2.29*  CALCIUM 8.9 8.8* 8.9  GFRNONAA 36* 33* 34*  ANIONGAP 10 10 10     Recent Labs  Lab 07/04/23 0551 07/05/23 0954 07/06/23 0353  PROT 5.8* 5.8* 5.9*  ALBUMIN 3.0* 2.9* 3.7  AST 66* 63* 47*  ALT 48* 46* 38  ALKPHOS 108 115 96  BILITOT 3.6* 2.2* 2.7*   Lipids No results for input(s): "CHOL", "TRIG", "HDL", "LABVLDL", "LDLCALC", "CHOLHDL" in the last 168 hours.  Hematology Recent Labs  Lab 07/04/23 0551 07/05/23 0356 07/06/23 0353  WBC 6.1 8.3 7.0  RBC 2.85* 3.03* 2.70*  HGB 9.1* 9.5* 8.4*  HCT 25.2* 26.9* 24.3*  MCV 88.4 88.8 90.0  MCH 31.9 31.4 31.1  MCHC 36.1* 35.3 34.6  RDW  14.7 15.0 15.2  PLT 100* 107* 88*   Thyroid No results for input(s): "TSH", "FREET4" in the last 168 hours.  BNPNo results for input(s): "BNP", "PROBNP" in the last 168 hours.  DDimer No results for input(s): "DDIMER" in the last 168 hours.   Radiology/Studies:  DG CHEST PORT 1 VIEW Result Date: 07/04/2023 CLINICAL DATA:  Decompensated cirrhosis. EXAM: PORTABLE CHEST 1 VIEW COMPARISON:  Radiograph yesterday FINDINGS: Stable heart size and mediastinal contours. Improvement in interstitial opacities. Small left pleural effusion on CT earlier today is not well seen by radiograph. No focal airspace disease. No pneumothorax. IMPRESSION: Improvement in interstitial opacities from yesterday. Small left pleural effusion on CT earlier today is not well seen by radiograph Electronically Signed   By: Narda Rutherford M.D.   On: 07/04/2023 23:03   CT VENOGRAM ABDOMEN PELVIS Result Date: 07/04/2023 CLINICAL DATA:  Bowel obstruction suspected Question occlusion of TIPS EXAM: CT VENOGRAM ABDOMEN AND PELVIS TECHNIQUE: Venographic phase images of the abdomen and pelvis were obtained following the administration of intravenous contrast. Multiplanar reformats and maximum intensity projections were generated. RADIATION DOSE REDUCTION: This exam was  performed according to the departmental dose-optimization program which includes automated exposure control, adjustment of the mA and/or kV according to patient size and/or use of iterative reconstruction technique. CONTRAST:  OMNIPAQUE IOHEXOL 350 MG/ML SOLN COMPARISON:  Hepatic Doppler earlier today demonstrating occluded TIPS. CT 05/19/2023 FINDINGS: Veins: Majority of the TIPS demonstrates no contrast opacification consistent with occlusion. There may be minimal contrast within the hepatic and portal venous aspects, however the majority of the stent is occluded. The extrahepatic portal vein appears patent. The splenic vein is patent. The renal veins are patent. The IVC and iliac veins are patent. No filling defects in the SMV or IMV. Lower chest: Small left and trace right pleural effusion. Hepatobiliary: Cirrhotic hepatic morphology. No evidence of focal liver lesion. Clips in the gallbladder fossa postcholecystectomy. No biliary dilatation. Pancreas: No pancreatic ductal dilatation. No evidence of pancreatic mass. There is generalized edema of the mesenteric fat which is not disproportionately affecting the pancreas. Spleen: No splenomegaly.  Greatest splenic length is 12.4 cm. Adrenals/Urinary Tract: No adrenal nodule. No hydronephrosis or renal calculi. Low-density lesions in the left kidney are too small to characterize. No further follow-up imaging is recommended. Foley catheter decompresses the urinary bladder. Stomach/Bowel: Detailed bowel assessment is limited in the presence of ascites and lack contrast. There are paraesophageal and perigastric varices. Ingested material distends the stomach. No small bowel distention or evidence of obstruction. Normal appendix visualized. Wall thickening of the ascending and proximal transverse colon may be due to portal colopathy. Mild gaseous distension of transverse colon. Sigmoid colon is redundant. Small volume of formed colonic stool. Vascular/Lymphatic:  Aortic and branch atherosclerosis. No aneurysm. There are prominent portal caval nodes are likely reactive. Reproductive: Prostate is unremarkable. Other: Moderate volume abdominopelvic ascites. Ascites tracks into an umbilical hernia. Generalized edema of the intra-abdominal and subcutaneous fat. No free intra-abdominal air. Musculoskeletal: The bones are diffusely under mineralized. Scattered Schmorl's nodes in the spine. There are no acute or suspicious osseous abnormalities. IMPRESSION: 1. CT confirms occluded TIPS. 2. Cirrhosis with moderate ascites.  Paraesophageal varices. 3. Wall thickening of the cecum and ascending colon may represent portal colopathy. 4. No bowel obstruction. Aortic Atherosclerosis (ICD10-I70.0). Electronically Signed   By: Narda Rutherford M.D.   On: 07/04/2023 19:33   IR Paracentesis Result Date: 07/04/2023 INDICATION: Patient with a history of cirrhosis with recurrent ascites. Interventional  radiology asked to perform a therapeutic paracentesis. EXAM: ULTRASOUND GUIDED PARACENTESIS MEDICATIONS: 1% lidocaine 20 mL COMPLICATIONS: None immediate. PROCEDURE: Informed written consent was obtained from the patient after a discussion of the risks, benefits and alternatives to treatment. A timeout was performed prior to the initiation of the procedure. Initial ultrasound scanning demonstrates a large amount of ascites within the right lower abdominal quadrant. The right lower abdomen was prepped and draped in the usual sterile fashion. 1% lidocaine was used for local anesthesia. Following this, a 19 gauge, 10-cm, Yueh catheter was introduced. An ultrasound image was saved for documentation purposes. The paracentesis was performed. The catheter was removed and a dressing was applied. The patient tolerated the procedure well without immediate post procedural complication. Patient received post-procedure intravenous albumin; see nursing notes for details. FINDINGS: A total of approximately 6.1  L of clear yellow fluid was removed. IMPRESSION: Successful ultrasound-guided paracentesis yielding 6.1 liters of peritoneal fluid. Procedure performed by Alwyn Ren NP PLAN: The patient is enrolled in the [facility]iology [facility] Hy[facility] and is being actively followed. He is status post TIPS on 07/20/2022 with Dr. Malachi Pro, MD [facility] Rad[facility] Electronically Signed   By: Roanna Banning M.D.   On: 07/04/2023 13:52   US ABDOMEN LIMITED WITH LIVER DOPPLER Result Date: 07/04/2023 CLINICAL DATA:  Decompensated cirrhosis, post TIPS creation 07/20/2022 EXAM: DUPLEX ULTRASOUND OF LIVER TECHNIQUE: Color and duplex Doppler ultrasound was performed to evaluate the hepatic in-flow and out-flow vessels. COMPARISON:  None Available. FINDINGS: Liver: Small liver with nodular contour. No focal lesion, mass or intrahepatic biliary ductal dilatation. Main Portal Vein size: 0.6 cm cm Portal Vein Velocities (hepatopetal): Main Prox:  17 cm/sec Main Mid: 22 cm/sec Main Dist:  19 cm/sec Right: Limited visualization, flow signal in the proximal aspect TIPS stent is identified, with no internal flow or color Doppler signal in its mid and hepatic vein components. Hepatic Vein Velocities (hepatofugal): Right:  27 cm/sec Middle:  28 cm/sec Left:  23 cm/sec IVC: Present and patent with normal respiratory phasicity. Velocity 24 cm/sec. Hepatic Artery Velocity:  89 cm/sec Splenic Vein Velocity:  22 cm/sec Spleen: 11.7 cm x 10.9 cm x 3.8 cm with a total volume of 252 cm^3 (411 cm^3 is upper limit normal) Portal Vein Occlusion/Thrombus: No Splenic Vein Occlusion/Thrombus: No Ascites: Small to moderate Varices: None IMPRESSION: 1. Occluded TIPS stent with no internal flow or color Doppler signal in its mid and hepatic vein components. 2. Small liver with nodular contour. 3. Small to moderate ascites. Electronically Signed   By: Corlis Leak M.D.   On:  07/04/2023 13:11   DG Chest Portable 1 View Result Date: 07/03/2023 CLINICAL DATA:  Dyspnea EXAM: PORTABLE CHEST 1 VIEW COMPARISON:  06/08/2023 FINDINGS: The heart size and mediastinal contours are within normal limits. Slightly increased interstitial markings bilaterally. Band-like atelectasis at the right lung base. No pleural effusion or pneumothorax. The visualized skeletal structures are unremarkable. IMPRESSION: Slightly increased interstitial markings bilaterally, which may reflect bronchitic lung changes versus mild edema or developing atypical/viral infection. Electronically Signed   By: Duanne Guess D.O.   On: 07/03/2023 18:07     Assessment and Plan:   Possible Right Heart Failure Patient was admitted for decompensated cirrhosis. He was found to have an occluded TIPs. He was initially planned scheduled to undergo revision of TIPS today but this was aborted because of discordance of direct mean right atrial pressure. Right atrial pressure was variable with  respiration ranging from 23 to 31 mmHg. Procedure was aborted as restoration of TIPS patency would cause even further increased right heart pressures. Per IR note, right atrial pressure would ideally be less than 20 mmHg prior to TIPS creation/ thrombectomy to prevent right heart overload. Therefore, we were consulted. Nephrology is currently managing his diuretics given his AKI. He is net negative 1.1 L.  - Volume overloaded on exam. - Will check BNP. Will also check recheck ammonia level as sister states he is a little more lethargic this afternoon. - Echo pending. - Continue IV Lasix per Nephrology. Currently on IV Lasix 40mg  twice daily. - He is on Spironolactone 100mg  daily at home but this is on hold given renal function and hyperkalemia. - Suspect he will need RHC as well. Will discuss timing of this with MD.   Hypertension He has a history of hypertension listed in his chart but he is on Midodrine at home. - BP soft at  times but stable. - Home Midodrine has been increased from 5mg  to 10mg  three times daily. - Continue to monitor.  AKI Creatinine 1.79 on admission and peaked at 2.32 on 1/15. Baseline around 1.1 to 1.3. Felt to be secondary to hepatorenal syndrome and hemodynamic changes after large volume paracentesis.  - Nephrology following.  Hyperkalemia In setting of AKI. - Management per Nephrology.  Otherwise, per primary team: - NASH cirrhosis - GERD - Anxiety/ depression - Normocytic anemia - Thrombocytopenia  - Obesity  Risk Assessment/Risk Scores:  {  New York Heart Association (NYHA) Functional Class NYHA Class III-VI. Functional status complicated by decompensated cirrhosis and profound weakness   For questions or updates, please contact [facility] Please consult [email or web address] for contact info under    Signed, Corrin Parker, PA-C  07/06/2023 1:57 PM

## 2023-07-07 ENCOUNTER — Inpatient Hospital Stay (HOSPITAL_COMMUNITY): Payer: Medicaid Other

## 2023-07-07 DIAGNOSIS — K7290 Hepatic failure, unspecified without coma: Secondary | ICD-10-CM | POA: Diagnosis not present

## 2023-07-07 DIAGNOSIS — K7460 Unspecified cirrhosis of liver: Secondary | ICD-10-CM | POA: Diagnosis not present

## 2023-07-07 DIAGNOSIS — E871 Hypo-osmolality and hyponatremia: Secondary | ICD-10-CM | POA: Diagnosis not present

## 2023-07-07 DIAGNOSIS — N179 Acute kidney failure, unspecified: Secondary | ICD-10-CM | POA: Diagnosis not present

## 2023-07-07 LAB — CBC WITH DIFFERENTIAL/PLATELET
Abs Immature Granulocytes: 0.02 10*3/uL (ref 0.00–0.07)
Basophils Absolute: 0 10*3/uL (ref 0.0–0.1)
Basophils Relative: 0 %
Eosinophils Absolute: 0.1 10*3/uL (ref 0.0–0.5)
Eosinophils Relative: 1 %
HCT: 24 % — ABNORMAL LOW (ref 39.0–52.0)
Hemoglobin: 8.3 g/dL — ABNORMAL LOW (ref 13.0–17.0)
Immature Granulocytes: 0 %
Lymphocytes Relative: 12 %
Lymphs Abs: 0.8 10*3/uL (ref 0.7–4.0)
MCH: 31.8 pg (ref 26.0–34.0)
MCHC: 34.6 g/dL (ref 30.0–36.0)
MCV: 92 fL (ref 80.0–100.0)
Monocytes Absolute: 0.9 10*3/uL (ref 0.1–1.0)
Monocytes Relative: 15 %
Neutro Abs: 4.5 10*3/uL (ref 1.7–7.7)
Neutrophils Relative %: 72 %
Platelets: 96 10*3/uL — ABNORMAL LOW (ref 150–400)
RBC: 2.61 MIL/uL — ABNORMAL LOW (ref 4.22–5.81)
RDW: 15.6 % — ABNORMAL HIGH (ref 11.5–15.5)
WBC: 6.4 10*3/uL (ref 4.0–10.5)
nRBC: 0 % (ref 0.0–0.2)

## 2023-07-07 LAB — BODY FLUID CULTURE W GRAM STAIN
Culture: NO GROWTH
Gram Stain: NONE SEEN

## 2023-07-07 LAB — AMMONIA: Ammonia: 32 umol/L (ref 9–35)

## 2023-07-07 LAB — BASIC METABOLIC PANEL
Anion gap: 8 (ref 5–15)
BUN: 26 mg/dL — ABNORMAL HIGH (ref 6–20)
CO2: 21 mmol/L — ABNORMAL LOW (ref 22–32)
Calcium: 8.6 mg/dL — ABNORMAL LOW (ref 8.9–10.3)
Chloride: 96 mmol/L — ABNORMAL LOW (ref 98–111)
Creatinine, Ser: 1.9 mg/dL — ABNORMAL HIGH (ref 0.61–1.24)
GFR, Estimated: 42 mL/min — ABNORMAL LOW (ref >60)
Glucose, Bld: 81 mg/dL (ref 70–99)
Potassium: 4.9 mmol/L (ref 3.5–5.1)
Sodium: 125 mmol/L — ABNORMAL LOW (ref 135–145)

## 2023-07-07 MED ORDER — ALBUMIN HUMAN 25 % IV SOLN
25.0000 g | Freq: Once | INTRAVENOUS | Status: AC
  Administered 2023-07-07: 25 g via INTRAVENOUS
  Filled 2023-07-07: qty 100

## 2023-07-07 MED ORDER — RIFAXIMIN 550 MG PO TABS: 550.0000 mg | ORAL_TABLET | Freq: Two times a day (BID) | ORAL | 0 refills | Status: DC

## 2023-07-07 MED ORDER — FUROSEMIDE 10 MG/ML IJ SOLN: 40.0000 mg | Freq: Two times a day (BID) | INTRAMUSCULAR | 0 refills | Status: DC

## 2023-07-07 NOTE — Progress Notes (Signed)
Foley removal discussed with MD, per MD leave Foley in place until after TIPS procedure  Margarita Grizzle

## 2023-07-07 NOTE — Progress Notes (Signed)
Daily Progress Note  DOA: 07/03/2023 Hospital Day: 5   Chief Complaint:  Decompensated cirrhosis   ASSESSMENT    Brief Narrative:  Erik Howe is a 53 y.o. year old male with a history of  Etoh abuse, decompensated cirrhosis followed by [facility], chronic loose stool, cholecystectomy, multiple polyps including an advanced polyp(s), colon AMV, NSAID induced duodenal ulcers, severe esophagitis, hiatal hernia . Admitted with decompensated cirrhosis and weakness. Found to have occluded TIPS   Decompensated MASH cirrhosis s/p TIPS.   Admitted with abdominal pain / SHOB , large volume ascites, TIPS occlusion.  MELD 3.0: 29 at 07/07/2023  7:43 AM Thrombectomy and TIPS revision wasn't able to be done as right atrial pressures were discordant with echocardiogram from 07/08/22. Cardiology has evaluated. Repeat Echo showing normal diastolic function and the RA pressure is low. Right heart cath felt not to be needed.  AKI , secondary to intravascular volume depletion post TIPS x 2, diuretics and HRS.   Ur sodium < 10 . Nephrology following. Cr improving on HRS treatment of albumin, midodrine and sandostatin.    Abdominal distention. Not all due to ascites I don't think. Suspect ileus.  Doubt obstructive process but says he hasn't had a BM since yesterday morning and taking lactulose as prescribed. No nausea.    Principal Problem:   Decompensated cirrhosis (HCC) Active Problems:   Hyponatremia   AKI (acute kidney injury) (HCC)   PLAN   --KUB --I received a call from [facility]. They were notified by Transplant Coordinator that patient was hospitalized. They reviewed records. MELD 30. Requesting patient be transferred to Internal Medicine Service at [facility] and Hepatology will evaluate ( Dr Timothy Lasso). I have discussed with the [facility] who will arrange for transfer.  --I called Sister Tammi to update her. She is in [place] today. She asks to be notified when patient gets bed  at [facility]  Subjective   Tired and mild generalized abdominal discomfort . More awake today   Objective    Recent Labs    07/05/23 0356 07/06/23 0353 07/07/23 0743  WBC 8.3 7.0 6.4  HGB 9.5* 8.4* 8.3*  HCT 26.9* 24.3* 24.0*  PLT 107* 88* 96*   BMET Recent Labs    07/05/23 0954 07/06/23 0353 07/07/23 0743  NA 122* 122* 125*  K 5.0 5.4* 4.9  CL 93* 92* 96*  CO2 19* 20* 21*  GLUCOSE 93 89 81  BUN 22* 25* 26*  CREATININE 2.32* 2.29* 1.90*  CALCIUM 8.8* 8.9 8.6*   LFT Recent Labs    07/06/23 0353  PROT 5.9*  ALBUMIN 3.7  AST 47*  ALT 38  ALKPHOS 96  BILITOT 2.7*   PT/INR Recent Labs    07/05/23 0954 07/06/23 0353  LABPROT 18.3* 20.4*  INR 1.5* 1.7*     Imaging:  IR US Guide Vasc Access Right CLINICAL DATA:  53 year old male with history of decompensated mash cirrhosis with refractory ascites status post tips creation on 07/20/2022. The patient is admitted with recurrent large volume ascites and imaging demonstrates occluded TIPS endograft.  EXAM: 1. Ultrasound-guided vascular access of the left internal jugular vein. 2. Central venous manometry.  MEDICATIONS: None.  ANESTHESIA/SEDATION: Moderate (conscious) sedation was employed during this procedure. A total of Versed 1 mg and Fentanyl 50 mcg was administered intravenously.  Moderate Sedation Time: 15 minutes. The patient's level of consciousness and vital signs were monitored continuously by radiology nursing throughout the procedure under my direct supervision.  CONTRAST:  None.  FLUOROSCOPY TIME:  Five mGy  COMPLICATIONS: None immediate.  PROCEDURE: Informed written consent was obtained from the patient after a thorough discussion of the procedural risks, benefits and alternatives. All questions were addressed. Maximal Sterile Barrier Technique was utilized including caps, mask, sterile gowns, sterile gloves, sterile drape, hand hygiene and skin antiseptic. A timeout was  performed prior to the initiation of the procedure.  Preprocedure ultrasound evaluation of the right internal jugular vein demonstrated complete occlusion with prominence of a external jugular vein, not suitable for access for this procedure. Therefore, the left neck was prepped and draped in standard fashion. Preprocedure ultrasound evaluation demonstrated patency of the left internal jugular vein. Subdermal Local anesthesia was provided at the planned needle entry site. A small skin nick was made. Under direct ultrasound visualization, the left internal jugular vein was punctured with a 21 gauge micropuncture needle. A permanent ultrasound image was captured and stored in the record. Micropuncture sheath was inserted in a Rosen wire was inserted and directed to the inferior vena cava. The micropuncture sheath was then exchanged for a 6 French, 45 cm angled tip sheath. The sheath tip was positioned in the right atrium. Central manometry was performed with a in right atrial pressure ranging from 23-31 mmHg with respiratory variation.  The wire and sheath were then removed and manual compression was applied to achieve hemostasis. A sterile bandage was applied.  IMPRESSION: Elevated right atrial mean pressure (31 mm Hg) unsafe for recanalization of indwelling TIPS due to threat of right heart overload.  Recommend Cardiology consultation. If right heart pressures able to be lowered to normal levels, TIPS recanalization can be revisited in the future.  Marliss Coots, MD  [facility]  [facility]  Electronically Signed   By: Marliss Coots M.D.   On: 07/06/2023 19:14 IR TIPS REVISION MOD SED CLINICAL DATA:  53 year old male with history of decompensated mash cirrhosis with refractory ascites status post tips creation on 07/20/2022. The patient is admitted with recurrent large volume ascites and imaging demonstrates occluded TIPS  endograft.  EXAM: 1. Ultrasound-guided vascular access of the left internal jugular vein. 2. Central venous manometry.  MEDICATIONS: None.  ANESTHESIA/SEDATION: Moderate (conscious) sedation was employed during this procedure. A total of Versed 1 mg and Fentanyl 50 mcg was administered intravenously.  Moderate Sedation Time: 15 minutes. The patient's level of consciousness and vital signs were monitored continuously by radiology nursing throughout the procedure under my direct supervision.  CONTRAST:  None.  FLUOROSCOPY TIME:  Five mGy  COMPLICATIONS: None immediate.  PROCEDURE: Informed written consent was obtained from the patient after a thorough discussion of the procedural risks, benefits and alternatives. All questions were addressed. Maximal Sterile Barrier Technique was utilized including caps, mask, sterile gowns, sterile gloves, sterile drape, hand hygiene and skin antiseptic. A timeout was performed prior to the initiation of the procedure.  Preprocedure ultrasound evaluation of the right internal jugular vein demonstrated complete occlusion with prominence of a external jugular vein, not suitable for access for this procedure. Therefore, the left neck was prepped and draped in standard fashion. Preprocedure ultrasound evaluation demonstrated patency of the left internal jugular vein. Subdermal Local anesthesia was provided at the planned needle entry site. A small skin nick was made. Under direct ultrasound visualization, the left internal jugular vein was punctured with a 21 gauge micropuncture needle. A permanent ultrasound image was captured and stored in the record. Micropuncture sheath was inserted in a Rosen wire was  inserted and directed to the inferior vena cava. The micropuncture sheath was then exchanged for a 6 French, 45 cm angled tip sheath. The sheath tip was positioned in the right atrium. Central manometry was performed with a in right  atrial pressure ranging from 23-31 mmHg with respiratory variation.  The wire and sheath were then removed and manual compression was applied to achieve hemostasis. A sterile bandage was applied.  IMPRESSION: Elevated right atrial mean pressure (31 mm Hg) unsafe for recanalization of indwelling TIPS due to threat of right heart overload.  Recommend Cardiology consultation. If right heart pressures able to be lowered to normal levels, TIPS recanalization can be revisited in the future.  Marliss Coots, MD  [facility]  [facility]  Electronically Signed   By: Marliss Coots M.D.   On: 07/06/2023 19:14 ECHOCARDIOGRAM COMPLETE BUBBLE STUDY    ECHOCARDIOGRAM REPORT       Patient Name:   MUBARAK PELON Date of Exam: 07/06/2023 Medical Rec #:  [id number]    Height:       73.0 in Accession #:    [id number]   Weight:       278.0 lb Date of Birth:  [birth year]    BSA:          2.475 m Patient Age:    52 years     BP:           105/63 mmHg Patient Gender: M            HR:           90 bpm. Exam Location:  Inpatient  Procedure: 2D Echo, Color Doppler, Cardiac Doppler and Saline Contrast Bubble            Study  Indications:    atrial septal defect   History:        Patient has prior history of Echocardiogram examinations, most                 recent 07/08/2022. Signs/Symptoms:Dyspnea.   Sonographer:    Delcie Roch RDCS Referring Phys: [phone] AVA SWAYZE    Sonographer Comments: Patient is obese. Image acquisition challenging due to patient body habitus. IMPRESSIONS   1. Left ventricular ejection fraction, by estimation, is 70 to 75%. The left ventricle has hyperdynamic function. The left ventricle has no regional wall motion abnormalities. Left ventricular diastolic parameters were normal.  2. Right ventricular systolic function is normal. The right ventricular size is normal.  3. The mitral valve is normal in structure. No evidence  of mitral valve regurgitation. No evidence of mitral stenosis.  4. The aortic valve is tricuspid. There is mild calcification of the aortic valve. Aortic valve regurgitation is not visualized. No aortic stenosis is present.  5. The inferior vena cava is normal in size with greater than 50% respiratory variability, suggesting right atrial pressure of 3 mmHg.  6. Agitated saline contrast bubble study was negative, with no evidence of any interatrial shunt.  FINDINGS  Left Ventricle: Left ventricular ejection fraction, by estimation, is 70 to 75%. The left ventricle has hyperdynamic function. The left ventricle has no regional wall motion abnormalities. The left ventricular internal cavity size was normal in size.  There is no left ventricular hypertrophy. Left ventricular diastolic parameters were normal.  Right Ventricle: The right ventricular size is normal. No increase in right ventricular wall thickness. Right ventricular systolic function is normal.  Left Atrium: Left atrial size was normal in size.  Right Atrium: Right atrial size was normal in size.  Pericardium: There is no evidence of pericardial effusion.  Mitral Valve: The mitral valve is normal in structure. No evidence of mitral valve regurgitation. No evidence of mitral valve stenosis.  Tricuspid Valve: The tricuspid valve is normal in structure. Tricuspid valve regurgitation is trivial. No evidence of tricuspid stenosis.  Aortic Valve: The aortic valve is tricuspid. There is mild calcification of the aortic valve. Aortic valve regurgitation is not visualized. No aortic stenosis is present.  Pulmonic Valve: The pulmonic valve was normal in structure. Pulmonic valve regurgitation is not visualized. No evidence of pulmonic stenosis.  Aorta: The aortic root is normal in size and structure.  Venous: The inferior vena cava is normal in size with greater than 50% respiratory variability, suggesting right atrial pressure of 3  mmHg.  IAS/Shunts: No atrial level shunt detected by color flow Doppler. Agitated saline contrast was given intravenously to evaluate for intracardiac shunting. Agitated saline contrast bubble study was negative, with no evidence of any interatrial shunt.    LEFT VENTRICLE PLAX 2D LVIDd:         5.20 cm   Diastology LVIDs:         3.00 cm   LV e' medial:    8.05 cm/s LV PW:         0.90 cm   LV E/e' medial:  11.9 LV IVS:        1.00 cm   LV e' lateral:   15.25 cm/s LVOT diam:     2.20 cm   LV E/e' lateral: 6.3 LVOT Area:     3.80 cm    RIGHT VENTRICLE             IVC RV Basal diam:  3.10 cm     IVC diam: 1.50 cm RV S prime:     21.00 cm/s TAPSE (M-mode): 2.6 cm  LEFT ATRIUM             Index        RIGHT ATRIUM           Index LA diam:        3.50 cm 1.41 cm/m   RA Area:     17.90 cm LA Vol (A2C):   74.7 ml 30.18 ml/m  RA Volume:   48.80 ml  19.72 ml/m LA Vol (A4C):   80.5 ml 32.53 ml/m LA Biplane Vol: 79.6 ml 32.16 ml/m    AORTA Ao Root diam: 3.20 cm  MITRAL VALVE MV Area (PHT): 2.99 cm     SHUNTS MV Decel Time: 254 msec     Systemic Diam: 2.20 cm MV E velocity: 95.90 cm/s MV A velocity: 115.00 cm/s MV E/A ratio:  0.83  Arvilla Meres MD Electronically signed by Arvilla Meres MD Signature Date/Time: 07/06/2023/7:06:55 PM      Final       Scheduled inpatient medications:   amitriptyline  10 mg Oral QHS   Chlorhexidine Gluconate Cloth  6 each Topical Daily   feeding supplement  237 mL Oral BID BM   furosemide  40 mg Intravenous Q12H   lactulose  30 g Oral QID   midodrine  10 mg Oral TID WC   pantoprazole  40 mg Oral BID   rifaximin  550 mg Oral BID   Continuous inpatient infusions:   octreotide (SANDOSTATIN) 500 mcg in sodium chloride 0.9 % 250 mL (2 mcg/mL) infusion 50 mcg/hr (07/07/23 0453)   PRN inpatient medications: acetaminophen **  OR** acetaminophen, iohexol, ondansetron **OR** ondansetron (ZOFRAN) IV, mouth rinse, oxyCODONE  Vital signs in  last 24 hours: Temp:  [98 F (36.7 C)-98.6 F (37 C)] 98 F (36.7 C) (01/17 1142) Pulse Rate:  [89-100] 97 (01/17 1142) Resp:  [18-20] 20 (01/17 0616) BP: (93-120)/(62-73) 93/67 (01/17 1142) SpO2:  [100 %] 100 % (01/17 1142) Last BM Date : 07/06/23  Intake/Output Summary (Last 24 hours) at 07/07/2023 1359 Last data filed at 07/07/2023 0616 Gross per 24 hour  Intake 542.66 ml  Output 1875 ml  Net -1332.34 ml    Intake/Output from previous day: 01/16 0701 - 01/17 0700 In: 542.7 [P.O.:120; I.V.:422.7] Out: 1875 [Urine:1875] Intake/Output this shift: No intake/output data recorded.   Physical Exam:  General: Alert male in NAD Heart:  Regular rate and rhythm.  Pulmonary: Normal respiratory effort Abdomen: Soft, largely distended, tympanitic with "metallic" bowel sounds Non-tender. Extremities: No significant lower extremity edema  Neurologic: Alert and oriented Psych: Pleasant. Cooperative.     LOS: 3 days   Willette Cluster ,NP 07/07/2023, 1:59 PM

## 2023-07-07 NOTE — Progress Notes (Signed)
DAILY PROGRESS NOTE   Patient Name: Erik Howe Date of Encounter: 07/07/2023 Cardiologist: None  Chief Complaint   No issues overnight  Patient Profile   Erik Howe is a 53 y.o. 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 Elby Showers (Intervention Radiology).   Subjective   Echo performed yesterday. LVEF 70-75%, normal diastolic function, normal RV systolic function, RA Pressure of 3 mmHg. No evidence of interatrial shunting.  Objective   Vitals:   07/06/23 1640 07/06/23 1955 07/07/23 0616 07/07/23 0729  BP: 105/63 120/73 95/62 110/73  Pulse: 89 89 100 98  Resp: 18 20 20    Temp: 98.6 F (37 C) 98.3 F (36.8 C) 98.1 F (36.7 C) 98 F (36.7 C)  TempSrc:  Oral Oral Oral  SpO2: 100% 100% 100% 100%  Weight:      Height:        Intake/Output Summary (Last 24 hours) at 07/07/2023 1056 Last data filed at 07/07/2023 1610 Gross per 24 hour  Intake 542.66 ml  Output 1875 ml  Net -1332.34 ml   Filed Weights   07/04/23 1342 07/04/23 1502  Weight: 118 kg 126.1 kg    Physical Exam   General appearance: alert and no distress Lungs: clear to auscultation bilaterally Heart: regular rate and rhythm Abdomen: protuberant, firm Extremities: edema 1+ LE and jaundiced skin Neurologic: Grossly normal  Inpatient Medications    Scheduled Meds:  amitriptyline  10 mg Oral QHS   Chlorhexidine Gluconate Cloth  6 each Topical Daily   feeding supplement  237 mL Oral BID BM   furosemide  40 mg Intravenous Q12H   lactulose  30 g Oral QID   midodrine  10 mg Oral TID WC   pantoprazole  40 mg Oral BID   rifaximin  550 mg Oral BID    Continuous Infusions:  octreotide (SANDOSTATIN) 500 mcg in sodium chloride 0.9 % 250 mL (2 mcg/mL) infusion 50 mcg/hr (07/07/23 0453)    PRN  Meds: acetaminophen **OR** acetaminophen, iohexol, ondansetron **OR** ondansetron (ZOFRAN) IV, mouth rinse, oxyCODONE   Labs   Results for orders placed or performed during the hospital encounter of 07/03/23 (from the past 48 hours)  Sodium, urine, random     Status: None   Collection Time: 07/05/23  6:55 PM  Result Value Ref Range   Sodium, Ur <10 mmol/L    Comment: Performed at [facility], [address], [place], [place] [address]  Osmolality, urine     Status: None   Collection Time: 07/05/23  6:55 PM  Result Value Ref Range   Osmolality, Ur 313 300 - 900 mOsm/kg    Comment: REPEATED TO VERIFY Performed at [facility], [address], [place], [place] [address]   CBC     Status: Abnormal   Collection Time: 07/06/23  3:53 AM  Result Value Ref Range   WBC 7.0 4.0 - 10.5 K/uL   RBC 2.70 (L) 4.22 - 5.81 MIL/uL   Hemoglobin 8.4 (L) 13.0 - 17.0 g/dL   HCT 19.1 (L) 47.8 - 29.5 %   MCV 90.0 80.0 - 100.0 fL   MCH 31.1 26.0 - 34.0 pg   MCHC 34.6 30.0 - 36.0 g/dL   RDW 62.1 30.8 - 65.7 %   Platelets 88 (L) 150 -  400 K/uL    Comment: Immature Platelet Fraction may be clinically indicated, consider ordering this additional test [id number] REPEATED TO VERIFY    nRBC 0.0 0.0 - 0.2 %    Comment: Performed at [facility], [address] [place], [place] [address]  Comprehensive metabolic panel     Status: Abnormal   Collection Time: 07/06/23  3:53 AM  Result Value Ref Range   Sodium 122 (L) 135 - 145 mmol/L   Potassium 5.4 (H) 3.5 - 5.1 mmol/L   Chloride 92 (L) 98 - 111 mmol/L   CO2 20 (L) 22 - 32 mmol/L   Glucose, Bld 89 70 - 99 mg/dL    Comment: Glucose reference range applies only to samples taken after fasting for at least 8 hours.   BUN 25 (H) 6 - 20 mg/dL   Creatinine, Ser 1.19 (H) 0.61 - 1.24 mg/dL   Calcium 8.9 8.9 - 14.7 mg/dL   Total Protein 5.9 (L) 6.5 - 8.1 g/dL   Albumin 3.7 3.5 - 5.0 g/dL   AST 47 (H) 15 - 41 U/L   ALT 38 0 - 44 U/L    Alkaline Phosphatase 96 38 - 126 U/L   Total Bilirubin 2.7 (H) 0.0 - 1.2 mg/dL   GFR, Estimated 34 (L) >60 mL/min    Comment: (NOTE) Calculated using the CKD-EPI Creatinine Equation (2021)    Anion gap 10 5 - 15    Comment: Performed at [facility], [address] [place], [place] [address]  Protime-INR     Status: Abnormal   Collection Time: 07/06/23  3:53 AM  Result Value Ref Range   Prothrombin Time 20.4 (H) 11.4 - 15.2 seconds   INR 1.7 (H) 0.8 - 1.2    Comment: (NOTE) INR goal varies based on device and disease states. Performed at [facility], [address], [place], [place] [address]   Brain natriuretic peptide     Status: Abnormal   Collection Time: 07/06/23  3:41 PM  Result Value Ref Range   B Natriuretic Peptide 134.4 (H) 0.0 - 100.0 pg/mL    Comment: Performed at [facility], [address], [place], [place] [address]  Ammonia     Status: None   Collection Time: 07/06/23  3:41 PM  Result Value Ref Range   Ammonia 33 9 - 35 umol/L    Comment: Performed at [facility], [address] [place], [place] [address]  CBC with Differential/Platelet     Status: Abnormal   Collection Time: 07/07/23  7:43 AM  Result Value Ref Range   WBC 6.4 4.0 - 10.5 K/uL   RBC 2.61 (L) 4.22 - 5.81 MIL/uL   Hemoglobin 8.3 (L) 13.0 - 17.0 g/dL   HCT 52.8 (L) 41.3 - 24.4 %   MCV 92.0 80.0 - 100.0 fL   MCH 31.8 26.0 - 34.0 pg   MCHC 34.6 30.0 - 36.0 g/dL   RDW 01.0 (H) 27.2 - 53.6 %   Platelets 96 (L) 150 - 400 K/uL    Comment: Immature Platelet Fraction may be clinically indicated, consider ordering this additional test [id number] REPEATED TO VERIFY    nRBC 0.0 0.0 - 0.2 %   Neutrophils Relative % 72 %   Neutro Abs 4.5 1.7 - 7.7 K/uL   Lymphocytes Relative 12 %   Lymphs Abs 0.8 0.7 - 4.0 K/uL   Monocytes Relative 15 %   Monocytes Absolute 0.9 0.1 -  1.0 K/uL   Eosinophils Relative 1 %   Eosinophils Absolute 0.1 0.0 - 0.5 K/uL   Basophils Relative 0 %    Basophils Absolute 0.0 0.0 - 0.1 K/uL   Immature Granulocytes 0 %   Abs Immature Granulocytes 0.02 0.00 - 0.07 K/uL    Comment: Performed at [facility], [address], [place], [place] [address]  Basic metabolic panel     Status: Abnormal   Collection Time: 07/07/23  7:43 AM  Result Value Ref Range   Sodium 125 (L) 135 - 145 mmol/L   Potassium 4.9 3.5 - 5.1 mmol/L   Chloride 96 (L) 98 - 111 mmol/L   CO2 21 (L) 22 - 32 mmol/L   Glucose, Bld 81 70 - 99 mg/dL    Comment: Glucose reference range applies only to samples taken after fasting for at least 8 hours.   BUN 26 (H) 6 - 20 mg/dL   Creatinine, Ser 2.59 (H) 0.61 - 1.24 mg/dL   Calcium 8.6 (L) 8.9 - 10.3 mg/dL   GFR, Estimated 42 (L) >60 mL/min    Comment: (NOTE) Calculated using the CKD-EPI Creatinine Equation (2021)    Anion gap 8 5 - 15    Comment: Performed at [facility], [address] [place], [place] [address]  Ammonia     Status: None   Collection Time: 07/07/23  7:43 AM  Result Value Ref Range   Ammonia 32 9 - 35 umol/L    Comment: Performed at [facility], [address] [place], [place] [address]    ECG   N/A  Telemetry   Sinus rhythm - Personally Reviewed  Radiology    IR TIPS REVISION MOD SED Result Date: 07/06/2023 CLINICAL DATA:  53 year old male with history of decompensated mash cirrhosis with refractory ascites status post tips creation on 07/20/2022. The patient is admitted with recurrent large volume ascites and imaging demonstrates occluded TIPS endograft. EXAM: 1. Ultrasound-guided vascular access of the left internal jugular vein. 2. Central venous manometry. MEDICATIONS: None. ANESTHESIA/SEDATION: Moderate (conscious) sedation was employed during this procedure. A total of Versed 1 mg and Fentanyl 50 mcg was administered intravenously. Moderate Sedation Time: 15 minutes. The patient's level of consciousness and vital signs were monitored continuously by radiology nursing  throughout the procedure under my direct supervision. CONTRAST:  None. FLUOROSCOPY TIME:  Five mGy COMPLICATIONS: None immediate. PROCEDURE: Informed written consent was obtained from the patient after a thorough discussion of the procedural risks, benefits and alternatives. All questions were addressed. Maximal Sterile Barrier Technique was utilized including caps, mask, sterile gowns, sterile gloves, sterile drape, hand hygiene and skin antiseptic. A timeout was performed prior to the initiation of the procedure. Preprocedure ultrasound evaluation of the right internal jugular vein demonstrated complete occlusion with prominence of a external jugular vein, not suitable for access for this procedure. Therefore, the left neck was prepped and draped in standard fashion. Preprocedure ultrasound evaluation demonstrated patency of the left internal jugular vein. Subdermal Local anesthesia was provided at the planned needle entry site. A small skin nick was made. Under direct ultrasound visualization, the left internal jugular vein was punctured with a 21 gauge micropuncture needle. A permanent ultrasound image was captured and stored in the record. Micropuncture sheath was inserted in a Rosen wire was inserted and directed to the inferior vena cava. The micropuncture sheath was then exchanged for a 6 French, 45 cm angled tip sheath. The sheath tip was positioned in the  right atrium. Central manometry was performed with a in right atrial pressure ranging from 23-31 mmHg with respiratory variation. The wire and sheath were then removed and manual compression was applied to achieve hemostasis. A sterile bandage was applied. IMPRESSION: Elevated right atrial mean pressure (31 mm Hg) unsafe for recanalization of indwelling TIPS due to threat of right heart overload. Recommend Cardiology consultation. If right heart pressures able to be lowered to normal levels, TIPS recanalization can be revisited in the future. Marliss Coots, MD [facility] Electronically Signed   By: Marliss Coots M.D.   On: 07/06/2023 19:14   IR US Guide Vasc Access Right Result Date: 07/06/2023 CLINICAL DATA:  53 year old male with history of decompensated mash cirrhosis with refractory ascites status post tips creation on 07/20/2022. The patient is admitted with recurrent large volume ascites and imaging demonstrates occluded TIPS endograft. EXAM: 1. Ultrasound-guided vascular access of the left internal jugular vein. 2. Central venous manometry. MEDICATIONS: None. ANESTHESIA/SEDATION: Moderate (conscious) sedation was employed during this procedure. A total of Versed 1 mg and Fentanyl 50 mcg was administered intravenously. Moderate Sedation Time: 15 minutes. The patient's level of consciousness and vital signs were monitored continuously by radiology nursing throughout the procedure under my direct supervision. CONTRAST:  None. FLUOROSCOPY TIME:  Five mGy COMPLICATIONS: None immediate. PROCEDURE: Informed written consent was obtained from the patient after a thorough discussion of the procedural risks, benefits and alternatives. All questions were addressed. Maximal Sterile Barrier Technique was utilized including caps, mask, sterile gowns, sterile gloves, sterile drape, hand hygiene and skin antiseptic. A timeout was performed prior to the initiation of the procedure. Preprocedure ultrasound evaluation of the right internal jugular vein demonstrated complete occlusion with prominence of a external jugular vein, not suitable for access for this procedure. Therefore, the left neck was prepped and draped in standard fashion. Preprocedure ultrasound evaluation demonstrated patency of the left internal jugular vein. Subdermal Local anesthesia was provided at the planned needle entry site. A small skin nick was made. Under direct ultrasound visualization, the left internal jugular vein was punctured  with a 21 gauge micropuncture needle. A permanent ultrasound image was captured and stored in the record. Micropuncture sheath was inserted in a Rosen wire was inserted and directed to the inferior vena cava. The micropuncture sheath was then exchanged for a 6 French, 45 cm angled tip sheath. The sheath tip was positioned in the right atrium. Central manometry was performed with a in right atrial pressure ranging from 23-31 mmHg with respiratory variation. The wire and sheath were then removed and manual compression was applied to achieve hemostasis. A sterile bandage was applied. IMPRESSION: Elevated right atrial mean pressure (31 mm Hg) unsafe for recanalization of indwelling TIPS due to threat of right heart overload. Recommend Cardiology consultation. If right heart pressures able to be lowered to normal levels, TIPS recanalization can be revisited in the future. Marliss Coots, MD [facility] Electronically Signed   By: Marliss Coots M.D.   On: 07/06/2023 19:14   ECHOCARDIOGRAM COMPLETE BUBBLE STUDY Result Date: 07/06/2023    ECHOCARDIOGRAM REPORT   Patient Name:   Erik Howe Date of Exam: 07/06/2023 Medical Rec #:  [id number]    Height:       73.0 in Accession #:    [id number]   Weight:       278.0 lb Date of Birth:  January 09, 1971    BSA:  2.475 m Patient Age:    52 years     BP:           105/63 mmHg Patient Gender: M            HR:           90 bpm. Exam Location:  Inpatient Procedure: 2D Echo, Color Doppler, Cardiac Doppler and Saline Contrast Bubble            Study Indications:    atrial septal defect  History:        Patient has prior history of Echocardiogram examinations, most                 recent 07/08/2022. Signs/Symptoms:Dyspnea.  Sonographer:    Delcie Roch RDCS Referring Phys: [id number] AVA SWAYZE  Sonographer Comments: Patient is obese. Image acquisition challenging due to patient body habitus. IMPRESSIONS  1. Left ventricular  ejection fraction, by estimation, is 70 to 75%. The left ventricle has hyperdynamic function. The left ventricle has no regional wall motion abnormalities. Left ventricular diastolic parameters were normal.  2. Right ventricular systolic function is normal. The right ventricular size is normal.  3. The mitral valve is normal in structure. No evidence of mitral valve regurgitation. No evidence of mitral stenosis.  4. The aortic valve is tricuspid. There is mild calcification of the aortic valve. Aortic valve regurgitation is not visualized. No aortic stenosis is present.  5. The inferior vena cava is normal in size with greater than 50% respiratory variability, suggesting right atrial pressure of 3 mmHg.  6. Agitated saline contrast bubble study was negative, with no evidence of any interatrial shunt. FINDINGS  Left Ventricle: Left ventricular ejection fraction, by estimation, is 70 to 75%. The left ventricle has hyperdynamic function. The left ventricle has no regional wall motion abnormalities. The left ventricular internal cavity size was normal in size. There is no left ventricular hypertrophy. Left ventricular diastolic parameters were normal. Right Ventricle: The right ventricular size is normal. No increase in right ventricular wall thickness. Right ventricular systolic function is normal. Left Atrium: Left atrial size was normal in size. Right Atrium: Right atrial size was normal in size. Pericardium: There is no evidence of pericardial effusion. Mitral Valve: The mitral valve is normal in structure. No evidence of mitral valve regurgitation. No evidence of mitral valve stenosis. Tricuspid Valve: The tricuspid valve is normal in structure. Tricuspid valve regurgitation is trivial. No evidence of tricuspid stenosis. Aortic Valve: The aortic valve is tricuspid. There is mild calcification of the aortic valve. Aortic valve regurgitation is not visualized. No aortic stenosis is present. Pulmonic Valve: The  pulmonic valve was normal in structure. Pulmonic valve regurgitation is not visualized. No evidence of pulmonic stenosis. Aorta: The aortic root is normal in size and structure. Venous: The inferior vena cava is normal in size with greater than 50% respiratory variability, suggesting right atrial pressure of 3 mmHg. IAS/Shunts: No atrial level shunt detected by color flow Doppler. Agitated saline contrast was given intravenously to evaluate for intracardiac shunting. Agitated saline contrast bubble study was negative, with no evidence of any interatrial shunt.  LEFT VENTRICLE PLAX 2D LVIDd:         5.20 cm   Diastology LVIDs:         3.00 cm   LV e' medial:    8.05 cm/s LV PW:         0.90 cm   LV E/e' medial:  11.9 LV IVS:  1.00 cm   LV e' lateral:   15.25 cm/s LVOT diam:     2.20 cm   LV E/e' lateral: 6.3 LVOT Area:     3.80 cm  RIGHT VENTRICLE             IVC RV Basal diam:  3.10 cm     IVC diam: 1.50 cm RV S prime:     21.00 cm/s TAPSE (M-mode): 2.6 cm LEFT ATRIUM             Index        RIGHT ATRIUM           Index LA diam:        3.50 cm 1.41 cm/m   RA Area:     17.90 cm LA Vol (A2C):   74.7 ml 30.18 ml/m  RA Volume:   48.80 ml  19.72 ml/m LA Vol (A4C):   80.5 ml 32.53 ml/m LA Biplane Vol: 79.6 ml 32.16 ml/m   AORTA Ao Root diam: 3.20 cm MITRAL VALVE MV Area (PHT): 2.99 cm     SHUNTS MV Decel Time: 254 msec     Systemic Diam: 2.20 cm MV E velocity: 95.90 cm/s MV A velocity: 115.00 cm/s MV E/A ratio:  0.83 Arvilla Meres MD Electronically signed by Arvilla Meres MD Signature Date/Time: 07/06/2023/7:06:55 PM    Final     Cardiac Studies   See echo above  Assessment   Principal Problem:   Decompensated cirrhosis (HCC) Active Problems:   Hyponatremia   AKI (acute kidney injury) (HCC)   Plan   Echo is unremarkable - LVEF is normal, diastolic function is normal, RA pressure is low - estimated at 3 mmHg based on small IVC that collapses. I do not feel that a right heart cath is  necessary prior to proceeding with TIPS revision. I communicated this to the patient and his family at beside/by phone.  [facility] will sign off.   Medication Recommendations:  none Other recommendations (labs, testing, etc):  none Follow up as an outpatient:  none   Time Spent Directly with Patient:  I have spent a total of 25 minutes with the patient reviewing hospital notes, telemetry, EKGs, labs and examining the patient as well as establishing an assessment and plan that was discussed personally with the patient.  > 50% of time was spent in direct patient care.  Length of Stay:  LOS: 3 days   Chrystie Nose, MD, [facility], FACP  [place]  [facility]  Medical Director of the [facility]  Card[facility]ovascular Risk Reduction Clinic Diplomate of the American Board of Clinical Lipidology Attending Cardiologist  Direct Dial: [phone]  Fax: [phone]  Website:  [email or web address] Nicely Brookelin Felber 07/07/2023, 10:56 AM

## 2023-07-07 NOTE — Progress Notes (Signed)
PROGRESS NOTE  Erik Howe WUJ:[id number] DOB: 08-06-1970 DOA: 07/03/2023 PCP: [name], PA-C  Brief History   The patient is a 53 yr old man who presented to [facility] on 07/03/2023 with complaints of progressively worsening shortness of breath and abdominal distention. 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 Since that procedure the patient has had multiple paracentesis ( about q2 weeks), and he has been admitted for hepatic encephalopathy. On admission the patient's sodium was 120, His creatinine was 1.8 (baseline 1.0) and his T bili was 3.6. AST and ALT were somewhat elevated at 75 and 50. INR was 1.4 and lactic acid was 2.2  There was a diagnostic paracentesis performed in the ED. 3100 cc Fluid was removed that was serosanguinous and cloudy. Culture has had no growth. Gram stain demonstrated no WBC and no organisms were seen. Nucleated cells were 78. Protein was low and Glucose was normal. No evidence of infection.  He was given albumin with that paracentesis.   AKI and hyponatremia were due to intravascular volume depletion.   The patient was admitted to a medical bed with telemetry. Today his creatinine increased to 2.15. Earlier today the patient was taken for large volume paracentesis. 6.1 L were removed. The patent was given 25 g albumin.   The patient underwent a CT Venogram of the abdomen to evaluate the patency of the TIPS. It was occluded. Plans are to have the TIPS revised later today.  Creatinine decreased only slightly on 07/06/2023. It is now 2.329 Nephrology has been consulted and the patient is receiving another dose of albumin. The patient's rising creatinine is largely due to hepatorenal syndrome.   The patient went for TIPS revision today. Unfortunately, it could not be done. Right heart pressures were too high, and  would only get higher if the occluded TIPS were opened up. Recommendation was for a cardiology consult to optimize the patient so revision of TIPS could be attempted again.  Sister is at bedside. All questions answered to the best of my ability.  A & P  Decompensated cirrhosis complicated by refractory ascites - Patient had paracentesis 2 weeks ago with significant abdominal distention - Shortness of breath - Paracentesis in the emergency department without calculated cell count - Previous TIPS appears to be completely occluded accounting for the rapid re-accumulation of fluid after his recent paracentesis. The patient went for TIPS revision today. Unfortunately, it could not be done. Right heart pressures were too high, and would only get higher if the occluded TIPS were opened up. Recommendation was for a cardiology consult to optimize the patient so revision of TIPS could be attempted again. -- Torsemide and spironolactone have been held. --Ceftriaxone for SBP prophylaxis -- The patient is somnolent, but easily awaken.    AKI --Creatinine decreased only slightly to 2.29. --Nephrology feels that elevated creatinine is due to hepatorenal syndrome.  Hepatic Encephalopathy Concern for hepatic encephalopathy is increased as the patient seems more somnolent today. This is despite the patient's assertion that he had 4 BM's on 07/05/2023. Will increase lactulose to QID.Will check ammonia level, although HE is a clinical diagnosis.  Insomnia-continue Elavil --The patient likely has her days and nights reversed. He is somnolent. Do not feel that this is due to hepatic encephalopathy as the patient is completely alert and oriented when he is awakened. Not difficult to awaken. Monitor.  GERD-continue Protonix  Acute Metabolic Acidosis --  Due to worsening renal function  Thrombocytopenia -  Due to decompensated hepatic cirrhosis. They have dropped to 88.     Admission status: Inpatient  Telemetry Medical  I have seen and examined this patient myself. I have spent 38 minutes in her evaluation and care.  DVT prophylaxis: SCD's Code Status: Full Code Family Communication: Sister is at bedside. All questions answered to the best of my ability. Disposition Plan: tbd    Erik Lippy, DO [facility] Direct contact: see [email or web address]  7PM-7AM contact night coverage as above 07/06/2023, 4:37 PM  LOS: 3 days   Consultants  Gastroenterology  Procedures  Paracentesis x 2 Todal of 9.2 liters removed.  Antibiotics  Ceftriaxone 1 gm Q 24 hours  Interval History/Subjective  The patient is somnolent. He states that he hasn't been able to sleep for a few days. When awake he is completely awake and alert. He has no new complaints.  Objective   Vitals:  Vitals:   07/06/23 1640 07/06/23 1955  BP: 105/63 120/73  Pulse: 89 89  Resp: 18 20  Temp: 98.6 F (37 C) 98.3 F (36.8 C)  SpO2: 100% 100%    Exam:  Exam:  Constitutional:  The patient is very somnolent. Although he periodically awakens, he only remains alert for a minute and then goes back to sleep. Periods of alertness are decreased and he is less communicative during these times.  Eyes pupils and irises appear normal Normal lids and conjunctivae Sclera are jaundiced Respiratory:  No increased work of breathing. No wheezes, rales, or rhonchi No tactile fremitus Cardiovascular:  Regular rate and rhythm No murmurs, ectopy, or gallups. No lateral PMI. No thrills. Abdomen:  Abdomen is soft, non-tender, distended No hernias, masses, or organomegaly Normoactive bowel sounds.  Musculoskeletal:  No cyanosis, clubbing, or edema Skin:  No rashes, lesions, ulcers palpation of skin: no induration or nodules Jaundiced Neurologic:  Unable to evaluate as the patient is unable to cooperate with exam. Psychiatric:  Unable to evaluate as the patient is unable to cooperate with exam.   I have personally  reviewed the following:   Today's Data   Vitals:   07/06/23 1640 07/06/23 1955  BP: 105/63 120/73  Pulse: 89 89  Resp: 18 20  Temp: 98.6 F (37 C) 98.3 F (36.8 C)  SpO2: 100% 100%     Lab Data  CBC    Component Value Date/Time   WBC 7.0 07/06/2023 0353   RBC 2.70 (L) 07/06/2023 0353   HGB 8.4 (L) 07/06/2023 0353   HGB 11.5 (L) 10/19/2022 1213   HCT 24.3 (L) 07/06/2023 0353   PLT 88 (L) 07/06/2023 0353   PLT 163 10/19/2022 1213   MCV 90.0 07/06/2023 0353   MCH 31.1 07/06/2023 0353   MCHC 34.6 07/06/2023 0353   RDW 15.2 07/06/2023 0353   LYMPHSABS 0.7 07/05/2023 0356   MONOABS 1.0 07/05/2023 0356   EOSABS 0.1 07/05/2023 0356   BASOSABS 0.0 07/05/2023 0356       Latest Ref Rng & Units 07/06/2023    3:53 AM 07/05/2023    9:54 AM 07/05/2023    3:56 AM  CMP  Glucose 70 - 99 mg/dL 89  93  96   BUN 6 - 20 mg/dL 25  22  19    Creatinine 0.61 - 1.24 mg/dL 1.61  0.96  0.45   Sodium 135 - 145 mmol/L 122  122  122   Potassium 3.5 -  5.1 mmol/L 5.4  5.0  4.9   Chloride 98 - 111 mmol/L 92  93  93   CO2 22 - 32 mmol/L 20  19  19    Calcium 8.9 - 10.3 mg/dL 8.9  8.8  8.9   Total Protein 6.5 - 8.1 g/dL 5.9  5.8    Total Bilirubin 0.0 - 1.2 mg/dL 2.7  2.2    Alkaline Phos 38 - 126 U/L 96  115    AST 15 - 41 U/L 47  63    ALT 0 - 44 U/L 38  46       Micro Data   Results for orders placed or performed during the hospital encounter of 07/03/23  Body fluid culture w Gram Stain     Status: None (Preliminary result)   Collection Time: 07/03/23  4:39 PM   Specimen: Peritoneal Cavity; Peritoneal Fluid  Result Value Ref Range Status   Specimen Description PERITONEAL CAVITY  Final   Special Requests NONE  Final   Gram Stain NO WBC SEEN NO ORGANISMS SEEN   Final   Culture   Final    NO GROWTH 3 DAYS Performed at [facility], [address] [place], [place] [address]    Report Status PENDING  Incomplete     Imaging  CT venogram of the abdomen  Scheduled Meds:   amitriptyline  10 mg Oral QHS   Chlorhexidine Gluconate Cloth  6 each Topical Daily   feeding supplement  237 mL Oral BID BM   furosemide  40 mg Intravenous Q12H   lactulose  30 g Oral QID   midodrine  10 mg Oral TID WC   pantoprazole  40 mg Oral BID   rifaximin  550 mg Oral BID    Principal Problem:   Decompensated cirrhosis (HCC) Active Problems:   Hyponatremia   AKI (acute kidney injury) (HCC)   Right-sided heart failure (HCC)   LOS: 3 days

## 2023-07-07 NOTE — Progress Notes (Signed)
Mobility Specialist Progress Note:    07/07/23 1000  Mobility  Activity Transferred from bed to chair  Level of Assistance Minimal assist, patient does 75% or more  Assistive Device Front wheel walker  Distance Ambulated (ft) 4 ft  Activity Response Tolerated well  Mobility Referral Yes  Mobility visit 1 Mobility  Mobility Specialist Start Time (ACUTE ONLY) [id number]  Mobility Specialist Stop Time (ACUTE ONLY) [id number]  Mobility Specialist Time Calculation (min) (ACUTE ONLY) 10 min   Pt received in bed and agreeable. No complaints throughout transfer. Pt left in chair with call bell and all needs met. Chair alarm on.  [name] Mobility Specialist Please contact via Special educational needs teacher or Rehab office at [phone]

## 2023-07-07 NOTE — Plan of Care (Signed)
  Problem: Health Behavior/Discharge Planning: Goal: Ability to manage health-related needs will improve Outcome: Completed/Met

## 2023-07-07 NOTE — Plan of Care (Signed)
  Problem: Health Behavior/Discharge Planning: Goal: Ability to manage health-related needs will improve Outcome: Progressing   Problem: Clinical Measurements: Goal: Ability to maintain clinical measurements within normal limits will improve Outcome: Progressing Goal: Will remain free from infection Outcome: Progressing Goal: Diagnostic test results will improve Outcome: Progressing Goal: Respiratory complications will improve Outcome: Progressing Goal: Cardiovascular complication will be avoided Outcome: Progressing   Problem: Activity: Goal: Risk for activity intolerance will decrease Outcome: Progressing   Problem: Nutrition: Goal: Adequate nutrition will be maintained Outcome: Progressing   Problem: Elimination: Goal: Will not experience complications related to bowel motility Outcome: Progressing Goal: Will not experience complications related to urinary retention Outcome: Progressing   Problem: Pain Management: Goal: General experience of comfort will improve Outcome: Progressing   Problem: Safety: Goal: Ability to remain free from injury will improve Outcome: Progressing   Problem: Skin Integrity: Goal: Risk for impaired skin integrity will decrease Outcome: Progressing

## 2023-07-07 NOTE — Discharge Summary (Signed)
Physician Discharge Summary  TONG MURK MVH:[id number] DOB: Mar 22, 1971 DOA: 07/03/2023  PCP: [name], PA-C  Admit date: 07/03/2023  Transfer date: 07/07/2023  Admitted From:Home  Disposition:  Alver Fisher  Recommendations for Outpatient Follow-up:  Follow up with Hepatology at [facility] Health:None  Equipment/Devices:None  Discharge Condition:Stable  CODE STATUS: Full  Diet recommendation: Heart Healthy  Brief/Interim Summary:  The patient is a 53 yr old man who presented to [facility] on 07/03/2023 with complaints of progressively worsening shortness of breath and abdominal distention. 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 Since that procedure the patient has had multiple paracentesis ( about q2 weeks), and he has been admitted for hepatic encephalopathy. 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 This was suspected to be related to TIPS occlusion, resulting in rapid reaccumulation of fluid. Revision of the TIPS was attempted by IR on 1/16, but right heart pressures were elevated and therefore, Cardiology was asked to evaluate. Fortunately, echo on 1/16 does not show this and no further workup is needed. [facility] team was notified of this patient's hospitalization and is requesting transfer to their facility for further care. He continues to remain on Lasix for diuresis along with albumin. He is now in stable condition for transfer with further recommendations per Hepatology.  Discharge Diagnoses:  Principal Problem:   Decompensated cirrhosis (HCC) Active Problems:   Hyponatremia   AKI (acute kidney injury) (HCC)  Decompensated cirrhosis complicated by refractory ascites -Transfer to [facility]    AKI --Creatinine decreased only slightly to 2.29. --Nephrology feels that elevated creatinine is due to hepatorenal syndrome.   Hepatic Encephalopathy Concern for hepatic encephalopathy is increased as the patient seems more somnolent today. This is despite the patient's assertion that he had 4 BM's on 07/05/2023. Will increase lactulose to QID.Will check ammonia level, although HE is a clinical diagnosis.   Insomnia-continue Elavil --The patient likely has her days and nights reversed. He is somnolent. Do not feel that this is due to hepatic encephalopathy as the patient is completely alert and oriented when he is awakened. Not difficult to awaken. Monitor.   GERD-continue Protonix   Acute Metabolic Acidosis --  Due to worsening renal function   Thrombocytopenia -  Due to decompensated hepatic cirrhosis. They have dropped to 88.  Discharge Instructions  Discharge Instructions     Diet - low sodium heart healthy   Complete by: As directed    Increase activity slowly   Complete by: As directed       Allergies as of 07/07/2023       Reactions   Tylenol [acetaminophen] Other (See Comments)   Told to avoid due to liver issues   Dust Mite Extract    Paroxetine Other (See Comments)   Stomach issues, "felt "wrong," and GI Intolerance   Pollen Extract    Tramadol Nausea Only        Medication List     TAKE these medications    albuterol 108 (90 Base) MCG/ACT inhaler Commonly known as: VENTOLIN HFA Inhale 2 puffs into the lungs every 6 (six) hours as needed for shortness of breath   amitriptyline 10 MG tablet Commonly known as: ELAVIL Take 1 tablet (10 mg total) by mouth at bedtime.   ammonium lactate 12 % lotion  Commonly known as: LAC-HYDRIN Apply topically as needed for dry skin. Twice a day   Baclofen 5 MG Tabs Take 2 tablets (10 mg total) by mouth 3 (three) times daily.   bismuth subsalicylate 262 MG/15ML suspension Commonly known as: PEPTO BISMOL Take 30 mLs  by mouth every 6 (six) hours as needed for indigestion.   clobetasol ointment 0.05 % Commonly known as: TEMOVATE Apply topically 2 (two) times a day. To affected area (right hand)   diphenhydrAMINE 50 MG tablet Commonly known as: BENADRYL Take 50 mg by mouth every 8 (eight) hours as needed for itching or allergies.   docusate sodium 100 MG capsule Commonly known as: COLACE Take 200 mg by mouth daily as needed (constipation.).   furosemide 10 MG/ML injection Commonly known as: LASIX Inject 4 mLs (40 mg total) into the vein every 12 (twelve) hours. Start taking on: July 08, 2023   lactulose 10 GM/15ML solution Commonly known as: CHRONULAC Take 3.33 g by mouth 3 (three) times daily.   magnesium oxide 400 (240 Mg) MG tablet Commonly known as: MAG-OX Take 400 mg by mouth daily.   midodrine 5 MG tablet Commonly known as: PROAMATINE Take 1 tablet (5 mg total) by mouth 3 (three) times daily with meals.   ondansetron 8 MG disintegrating tablet Commonly known as: ZOFRAN-ODT Take 1 tablet (8 mg total) by mouth 3 (three) times daily as needed for nausea.   pantoprazole 40 MG tablet Commonly known as: PROTONIX Take 1 tablet (40 mg total) by mouth 2 (two) times daily.   potassium chloride SA 20 MEQ tablet Commonly known as: KLOR-CON M Take 2 tablets (40 mEq total) by mouth daily.   rifaximin 550 MG Tabs tablet Commonly known as: XIFAXAN Take 1 tablet (550 mg total) by mouth 2 (two) times daily.   spironolactone 100 MG tablet Commonly known as: ALDACTONE Take 2 tablets (200 mg total) by mouth daily.   SSD 1 % cream Generic drug: silver sulfADIAZINE Apply topically 2 (two) times a day. To affected areas What changed:  when to take this reasons to take this   sucralfate 1 g tablet Commonly known as: Carafate Take 1 tablet (1 g total) by mouth 4 (four) times daily -  with meals and at bedtime. Please dissolve the tablet in a tablespoon of water and drink the liquid 4  times a day   torsemide 20 MG tablet Commonly known as: DEMADEX Take 2 tablets (40 mg total) by mouth daily.        Follow-up Information     Donell Beers, FNP Follow up on 07/31/2023.   Specialty: Nurse Practitioner Why: 3:20 please be  there at 3pm to fill out paperwork Contact information: [address] [place], [place] [address] [phone]                Allergies  Allergen Reactions   Tylenol [Acetaminophen] Other (See Comments)    Told to avoid due to liver issues   Dust Mite Extract    Paroxetine Other (See Comments)    Stomach issues, "felt "wrong," and GI Intolerance   Pollen Extract    Tramadol Nausea Only    Consultations: GI IR Cardiology Nephrology   Procedures/Studies: IR TIPS REVISION MOD SED Result Date: 07/06/2023 CLINICAL DATA:  53 year old male with history of decompensated mash cirrhosis with refractory ascites status post tips creation on 07/20/2022. The patient is admitted with recurrent large volume ascites and imaging demonstrates occluded TIPS endograft. EXAM: 1.  Ultrasound-guided vascular access of the left internal jugular vein. 2. Central venous manometry. MEDICATIONS: None. ANESTHESIA/SEDATION: Moderate (conscious) sedation was employed during this procedure. A total of Versed 1 mg and Fentanyl 50 mcg was administered intravenously. Moderate Sedation Time: 15 minutes. The patient's level of consciousness and vital signs were monitored continuously by radiology nursing throughout the procedure under my direct supervision. CONTRAST:  None. FLUOROSCOPY TIME:  Five mGy COMPLICATIONS: None immediate. PROCEDURE: Informed written consent was obtained from the patient after a thorough discussion of the procedural risks, benefits and alternatives. All questions were addressed. Maximal Sterile Barrier Technique was utilized including caps, mask, sterile gowns, sterile gloves, sterile drape, hand hygiene and skin antiseptic. A timeout  was performed prior to the initiation of the procedure. Preprocedure ultrasound evaluation of the right internal jugular vein demonstrated complete occlusion with prominence of a external jugular vein, not suitable for access for this procedure. Therefore, the left neck was prepped and draped in standard fashion. Preprocedure ultrasound evaluation demonstrated patency of the left internal jugular vein. Subdermal Local anesthesia was provided at the planned needle entry site. A small skin nick was made. Under direct ultrasound visualization, the left internal jugular vein was punctured with a 21 gauge micropuncture needle. A permanent ultrasound image was captured and stored in the record. Micropuncture sheath was inserted in a Rosen wire was inserted and directed to the inferior vena cava. The micropuncture sheath was then exchanged for a 6 French, 45 cm angled tip sheath. The sheath tip was positioned in the right atrium. Central manometry was performed with a in right atrial pressure ranging from 23-31 mmHg with respiratory variation. The wire and sheath were then removed and manual compression was applied to achieve hemostasis. A sterile bandage was applied. IMPRESSION: Elevated right atrial mean pressure (31 mm Hg) unsafe for recanalization of indwelling TIPS due to threat of right heart overload. Recommend Cardiology consultation. If right heart pressures able to be lowered to normal levels, TIPS recanalization can be revisited in the future. Marliss Coots, MD [facility] Radiology Electronically Signed   By: Marliss Coots M.D.   On: 07/06/2023 19:14   IR US Guide Vasc Access Right Result Date: 07/06/2023 CLINICAL DATA:  53 year old male with history of decompensated mash cirrhosis with refractory ascites status post tips creation on 07/20/2022. The patient is admitted with recurrent large volume ascites and imaging demonstrates occluded TIPS endograft. EXAM:  1. Ultrasound-guided vascular access of the left internal jugular vein. 2. Central venous manometry. MEDICATIONS: None. ANESTHESIA/SEDATION: Moderate (conscious) sedation was employed during this procedure. A total of Versed 1 mg and Fentanyl 50 mcg was administered intravenously. Moderate Sedation Time: 15 minutes. The patient's level of consciousness and vital signs were monitored continuously by radiology nursing throughout the procedure under my direct supervision. CONTRAST:  None. FLUOROSCOPY TIME:  Five mGy COMPLICATIONS: None immediate. PROCEDURE: Informed written consent was obtained from the patient after a thorough discussion of the procedural risks, benefits and alternatives. All questions were addressed. Maximal Sterile Barrier Technique was utilized including caps, mask, sterile gowns, sterile gloves, sterile drape, hand hygiene and skin antiseptic. A timeout was performed prior to the initiation of the procedure. Preprocedure ultrasound evaluation of the right internal jugular vein demonstrated complete occlusion with prominence of a external jugular vein, not suitable for access for this procedure. Therefore, the left neck was prepped and draped in standard fashion. Preprocedure ultrasound evaluation demonstrated patency of the left internal jugular vein. Subdermal Local anesthesia was provided  at the planned needle entry site. A small skin nick was made. Under direct ultrasound visualization, the left internal jugular vein was punctured with a 21 gauge micropuncture needle. A permanent ultrasound image was captured and stored in the record. Micropuncture sheath was inserted in a Rosen wire was inserted and directed to the inferior vena cava. The micropuncture sheath was then exchanged for a 6 French, 45 cm angled tip sheath. The sheath tip was positioned in the right atrium. Central manometry was performed with a in right atrial pressure ranging from 23-31 mmHg with respiratory variation. The wire  and sheath were then removed and manual compression was applied to achieve hemostasis. A sterile bandage was applied. IMPRESSION: Elevated right atrial mean pressure (31 mm Hg) unsafe for recanalization of indwelling TIPS due to threat of right heart overload. Recommend Cardiology consultation. If right heart pressures able to be lowered to normal levels, TIPS recanalization can be revisited in the future. Marliss Coots, MD [facility] Radiology Electronically Signed   By: Marliss Coots M.D.   On: 07/06/2023 19:14   ECHOCARDIOGRAM COMPLETE BUBBLE STUDY Result Date: 07/06/2023    ECHOCARDIOGRAM REPORT   Patient Name:   Erik Howe Date of Exam: 07/06/2023 Medical Rec #:  [id number]    Height:       73.0 in Accession #:    [id number]   Weight:       278.0 lb Date of Birth:  15-Oct-1970    BSA:          2.475 m Patient Age:    52 years     BP:           105/63 mmHg Patient Gender: M            HR:           90 bpm. Exam Location:  Inpatient Procedure: 2D Echo, Color Doppler, Cardiac Doppler and Saline Contrast Bubble            Study Indications:    atrial septal defect  History:        Patient has prior history of Echocardiogram examinations, most                 recent 07/08/2022. Signs/Symptoms:Dyspnea.  Sonographer:    Delcie Roch RDCS Referring Phys: [phone] AVA SWAYZE  Sonographer Comments: Patient is obese. Image acquisition challenging due to patient body habitus. IMPRESSIONS  1. Left ventricular ejection fraction, by estimation, is 70 to 75%. The left ventricle has hyperdynamic function. The left ventricle has no regional wall motion abnormalities. Left ventricular diastolic parameters were normal.  2. Right ventricular systolic function is normal. The right ventricular size is normal.  3. The mitral valve is normal in structure. No evidence of mitral valve regurgitation. No evidence of mitral stenosis.  4. The aortic valve is tricuspid. There is mild  calcification of the aortic valve. Aortic valve regurgitation is not visualized. No aortic stenosis is present.  5. The inferior vena cava is normal in size with greater than 50% respiratory variability, suggesting right atrial pressure of 3 mmHg.  6. Agitated saline contrast bubble study was negative, with no evidence of any interatrial shunt. FINDINGS  Left Ventricle: Left ventricular ejection fraction, by estimation, is 70 to 75%. The left ventricle has hyperdynamic function. The left ventricle has no regional wall motion abnormalities. The left ventricular internal cavity size was normal in size. There is no left ventricular hypertrophy. Left ventricular diastolic parameters were  normal. Right Ventricle: The right ventricular size is normal. No increase in right ventricular wall thickness. Right ventricular systolic function is normal. Left Atrium: Left atrial size was normal in size. Right Atrium: Right atrial size was normal in size. Pericardium: There is no evidence of pericardial effusion. Mitral Valve: The mitral valve is normal in structure. No evidence of mitral valve regurgitation. No evidence of mitral valve stenosis. Tricuspid Valve: The tricuspid valve is normal in structure. Tricuspid valve regurgitation is trivial. No evidence of tricuspid stenosis. Aortic Valve: The aortic valve is tricuspid. There is mild calcification of the aortic valve. Aortic valve regurgitation is not visualized. No aortic stenosis is present. Pulmonic Valve: The pulmonic valve was normal in structure. Pulmonic valve regurgitation is not visualized. No evidence of pulmonic stenosis. Aorta: The aortic root is normal in size and structure. Venous: The inferior vena cava is normal in size with greater than 50% respiratory variability, suggesting right atrial pressure of 3 mmHg. IAS/Shunts: No atrial level shunt detected by color flow Doppler. Agitated saline contrast was given intravenously to evaluate for intracardiac shunting.  Agitated saline contrast bubble study was negative, with no evidence of any interatrial shunt.  LEFT VENTRICLE PLAX 2D LVIDd:         5.20 cm   Diastology LVIDs:         3.00 cm   LV e' medial:    8.05 cm/s LV PW:         0.90 cm   LV E/e' medial:  11.9 LV IVS:        1.00 cm   LV e' lateral:   15.25 cm/s LVOT diam:     2.20 cm   LV E/e' lateral: 6.3 LVOT Area:     3.80 cm  RIGHT VENTRICLE             IVC RV Basal diam:  3.10 cm     IVC diam: 1.50 cm RV S prime:     21.00 cm/s TAPSE (M-mode): 2.6 cm LEFT ATRIUM             Index        RIGHT ATRIUM           Index LA diam:        3.50 cm 1.41 cm/m   RA Area:     17.90 cm LA Vol (A2C):   74.7 ml 30.18 ml/m  RA Volume:   48.80 ml  19.72 ml/m LA Vol (A4C):   80.5 ml 32.53 ml/m LA Biplane Vol: 79.6 ml 32.16 ml/m   AORTA Ao Root diam: 3.20 cm MITRAL VALVE MV Area (PHT): 2.99 cm     SHUNTS MV Decel Time: 254 msec     Systemic Diam: 2.20 cm MV E velocity: 95.90 cm/s MV A velocity: 115.00 cm/s MV E/A ratio:  0.83 Arvilla Meres MD Electronically signed by Arvilla Meres MD Signature Date/Time: 07/06/2023/7:06:55 PM    Final    DG CHEST PORT 1 VIEW Result Date: 07/04/2023 CLINICAL DATA:  Decompensated cirrhosis. EXAM: PORTABLE CHEST 1 VIEW COMPARISON:  Radiograph yesterday FINDINGS: Stable heart size and mediastinal contours. Improvement in interstitial opacities. Small left pleural effusion on CT earlier today is not well seen by radiograph. No focal airspace disease. No pneumothorax. IMPRESSION: Improvement in interstitial opacities from yesterday. Small left pleural effusion on CT earlier today is not well seen by radiograph Electronically Signed   By: Narda Rutherford M.D.   On: 07/04/2023 23:03   CT VENOGRAM ABDOMEN  PELVIS Result Date: 07/04/2023 CLINICAL DATA:  Bowel obstruction suspected Question occlusion of TIPS EXAM: CT VENOGRAM ABDOMEN AND PELVIS TECHNIQUE: Venographic phase images of the abdomen and pelvis were obtained following the  administration of intravenous contrast. Multiplanar reformats and maximum intensity projections were generated. RADIATION DOSE REDUCTION: This exam was performed according to the departmental dose-optimization program which includes automated exposure control, adjustment of the mA and/or kV according to patient size and/or use of iterative reconstruction technique. CONTRAST:  OMNIPAQUE IOHEXOL 350 MG/ML SOLN COMPARISON:  Hepatic Doppler earlier today demonstrating occluded TIPS. CT 05/19/2023 FINDINGS: Veins: Majority of the TIPS demonstrates no contrast opacification consistent with occlusion. There may be minimal contrast within the hepatic and portal venous aspects, however the majority of the stent is occluded. The extrahepatic portal vein appears patent. The splenic vein is patent. The renal veins are patent. The IVC and iliac veins are patent. No filling defects in the SMV or IMV. Lower chest: Small left and trace right pleural effusion. Hepatobiliary: Cirrhotic hepatic morphology. No evidence of focal liver lesion. Clips in the gallbladder fossa postcholecystectomy. No biliary dilatation. Pancreas: No pancreatic ductal dilatation. No evidence of pancreatic mass. There is generalized edema of the mesenteric fat which is not disproportionately affecting the pancreas. Spleen: No splenomegaly.  Greatest splenic length is 12.4 cm. Adrenals/Urinary Tract: No adrenal nodule. No hydronephrosis or renal calculi. Low-density lesions in the left kidney are too small to characterize. No further follow-up imaging is recommended. Foley catheter decompresses the urinary bladder. Stomach/Bowel: Detailed bowel assessment is limited in the presence of ascites and lack contrast. There are paraesophageal and perigastric varices. Ingested material distends the stomach. No small bowel distention or evidence of obstruction. Normal appendix visualized. Wall thickening of the ascending and proximal transverse colon may be due  to portal colopathy. Mild gaseous distension of transverse colon. Sigmoid colon is redundant. Small volume of formed colonic stool. Vascular/Lymphatic: Aortic and branch atherosclerosis. No aneurysm. There are prominent portal caval nodes are likely reactive. Reproductive: Prostate is unremarkable. Other: Moderate volume abdominopelvic ascites. Ascites tracks into an umbilical hernia. Generalized edema of the intra-abdominal and subcutaneous fat. No free intra-abdominal air. Musculoskeletal: The bones are diffusely under mineralized. Scattered Schmorl's nodes in the spine. There are no acute or suspicious osseous abnormalities. IMPRESSION: 1. CT confirms occluded TIPS. 2. Cirrhosis with moderate ascites.  Paraesophageal varices. 3. Wall thickening of the cecum and ascending colon may represent portal colopathy. 4. No bowel obstruction. Aortic Atherosclerosis (ICD10-I70.0). Electronically Signed   By: Narda Rutherford M.D.   On: 07/04/2023 19:33   IR Paracentesis Result Date: 07/04/2023 INDICATION: Patient with a history of cirrhosis with recurrent ascites. Interventional radiology asked to perform a therapeutic paracentesis. EXAM: ULTRASOUND GUIDED PARACENTESIS MEDICATIONS: 1% lidocaine 20 mL COMPLICATIONS: None immediate. PROCEDURE: Informed written consent was obtained from the patient after a discussion of the risks, benefits and alternatives to treatment. A timeout was performed prior to the initiation of the procedure. Initial ultrasound scanning demonstrates a large amount of ascites within the right lower abdominal quadrant. The right lower abdomen was prepped and draped in the usual sterile fashion. 1% lidocaine was used for local anesthesia. Following this, a 19 gauge, 10-cm, Yueh catheter was introduced. An ultrasound image was saved for documentation purposes. The paracentesis was performed. The catheter was removed and a dressing was applied. The patient tolerated the procedure well without immediate  post procedural complication. Patient received post-procedure intravenous albumin; see nursing notes for details. FINDINGS: A total of approximately  6.1 L of clear yellow fluid was removed. IMPRESSION: Successful ultrasound-guided paracentesis yielding 6.1 liters of peritoneal fluid. Procedure performed by Alwyn Ren NP PLAN: The patient is enrolled in the [facility]iology [facility] and is being actively followed. He is status post TIPS on 07/20/2022 with Dr. Malachi Pro, MD [facility] Radiology Electronically Signed   By: Roanna Banning M.D.   On: 07/04/2023 13:52   US ABDOMEN LIMITED WITH LIVER DOPPLER Result Date: 07/04/2023 CLINICAL DATA:  Decompensated cirrhosis, post TIPS creation 07/20/2022 EXAM: DUPLEX ULTRASOUND OF LIVER TECHNIQUE: Color and duplex Doppler ultrasound was performed to evaluate the hepatic in-flow and out-flow vessels. COMPARISON:  None Available. FINDINGS: Liver: Small liver with nodular contour. No focal lesion, mass or intrahepatic biliary ductal dilatation. Main Portal Vein size: 0.6 cm cm Portal Vein Velocities (hepatopetal): Main Prox:  17 cm/sec Main Mid: 22 cm/sec Main Dist:  19 cm/sec Right: Limited visualization, flow signal in the proximal aspect TIPS stent is identified, with no internal flow or color Doppler signal in its mid and hepatic vein components. Hepatic Vein Velocities (hepatofugal): Right:  27 cm/sec Middle:  28 cm/sec Left:  23 cm/sec IVC: Present and patent with normal respiratory phasicity. Velocity 24 cm/sec. Hepatic Artery Velocity:  89 cm/sec Splenic Vein Velocity:  22 cm/sec Spleen: 11.7 cm x 10.9 cm x 3.8 cm with a total volume of 252 cm^3 (411 cm^3 is upper limit normal) Portal Vein Occlusion/Thrombus: No Splenic Vein Occlusion/Thrombus: No Ascites: Small to moderate Varices: None IMPRESSION: 1. Occluded TIPS stent with no internal flow or color Doppler signal in  its mid and hepatic vein components. 2. Small liver with nodular contour. 3. Small to moderate ascites. Electronically Signed   By: Corlis Leak M.D.   On: 07/04/2023 13:11   DG Chest Portable 1 View Result Date: 07/03/2023 CLINICAL DATA:  Dyspnea EXAM: PORTABLE CHEST 1 VIEW COMPARISON:  06/08/2023 FINDINGS: The heart size and mediastinal contours are within normal limits. Slightly increased interstitial markings bilaterally. Band-like atelectasis at the right lung base. No pleural effusion or pneumothorax. The visualized skeletal structures are unremarkable. IMPRESSION: Slightly increased interstitial markings bilaterally, which may reflect bronchitic lung changes versus mild edema or developing atypical/viral infection. Electronically Signed   By: Duanne Guess D.O.   On: 07/03/2023 18:07   IR Paracentesis Result Date: 06/19/2023 INDICATION: 53 year old male with recurrent ascites. Request made for therapeutic paracentesis. EXAM: ULTRASOUND GUIDED THERAPEUTIC PARACENTESIS MEDICATIONS: 10 mL 1% lidocaine COMPLICATIONS: None immediate. PROCEDURE: Informed written consent was obtained from the patient after a discussion of the risks, benefits and alternatives to treatment. A timeout was performed prior to the initiation of the procedure. Initial ultrasound scanning demonstrates a large amount of ascites within the left lateral abdominal quadrant. The left lateral abdomen was prepped and draped in the usual sterile fashion. 1% lidocaine was used for local anesthesia. Following this, a 19 gauge, 7-cm, Yueh catheter was introduced. An ultrasound image was saved for documentation purposes. The paracentesis was performed. The catheter was removed and a dressing was applied. The patient tolerated the procedure well without immediate post procedural complication. Patient received post-procedure intravenous albumin; see nursing notes for details. FINDINGS: A total of approximately 7.1 of yellow fluid was removed.  IMPRESSION: Successful ultrasound-guided paracentesis yielding 7.1 liters of peritoneal fluid. Performed by: Loyce Dys PA-C Electronically Signed   By: Irish Lack M.D.   On: 06/19/2023 16:16   MR BRAIN WO CONTRAST Result Date:  06/08/2023 CLINICAL DATA:  Mental status change with unknown cause. EXAM: MRI HEAD WITHOUT CONTRAST TECHNIQUE: Multiplanar, multiecho pulse sequences of the brain and surrounding structures were obtained without intravenous contrast. COMPARISON:  Yesterday FINDINGS: Brain: No acute infarction, hemorrhage, hydrocephalus, extra-axial collection or mass lesion. No white matter disease or atrophy. Vascular: Normal flow voids. Skull and upper cervical spine: Normal marrow signal. Sinuses/Orbits: Negative. Other: Intermittent motion artifact. IMPRESSION: Negative motion degraded brain MRI. Electronically Signed   By: Tiburcio Pea M.D.   On: 06/08/2023 05:30   DG CHEST PORT 1 VIEW Result Date: 06/08/2023 CLINICAL DATA:  Altered mental status EXAM: PORTABLE CHEST 1 VIEW COMPARISON:  05/19/2023 FINDINGS: Normal cardiomediastinal silhouette. Low lung volumes. No focal consolidation, pleural effusion, or pneumothorax. No displaced rib fractures. IMPRESSION: No acute cardiopulmonary disease. Electronically Signed   By: Minerva Fester M.D.   On: 06/08/2023 01:43   CT HEAD WO CONTRAST ( ) Result Date: 06/08/2023 CLINICAL DATA:  Delirium EXAM: CT HEAD WITHOUT CONTRAST TECHNIQUE: Contiguous axial images were obtained from the base of the skull through the vertex without intravenous contrast. RADIATION DOSE REDUCTION: This exam was performed according to the departmental dose-optimization program which includes automated exposure control, adjustment of the mA and/or kV according to patient size and/or use of iterative reconstruction technique. COMPARISON:  CT brain 05/19/2023 FINDINGS: Brain: No evidence of acute infarction, hemorrhage, hydrocephalus, extra-axial collection or mass  lesion/mass effect. Vascular: No hyperdense vessel or unexpected calcification. Skull: Normal. Negative for fracture or focal lesion. Sinuses/Orbits: No acute finding. Other: None IMPRESSION: Negative non contrasted CT appearance of the brain. Electronically Signed   By: Jasmine Pang M.D.   On: 06/08/2023 00:03     Discharge Exam: Vitals:   07/07/23 0729 07/07/23 1142  BP: 110/73 93/67  Pulse: 98 97  Resp:    Temp: 98 F (36.7 C) 98 F (36.7 C)  SpO2: 100% 100%   Vitals:   07/06/23 1955 07/07/23 0616 07/07/23 0729 07/07/23 1142  BP: 120/73 95/62 110/73 93/67  Pulse: 89 100 98 97  Resp: 20 20    Temp: 98.3 F (36.8 C) 98.1 F (36.7 C) 98 F (36.7 C) 98 F (36.7 C)  TempSrc: Oral Oral Oral Oral  SpO2: 100% 100% 100% 100%  Weight:      Height:        General: Pt is alert, awake, not in acute distress Cardiovascular: RRR, S1/S2 +, no rubs, no gallops Respiratory: CTA bilaterally, no wheezing, no rhonchi on [place] Abdominal: Soft, NT, distended, bowel sounds + Extremities: no edema, no cyanosis    The results of significant diagnostics from this hospitalization (including imaging, microbiology, ancillary and laboratory) are listed below for reference.     Microbiology: Recent Results (from the past 240 hours)  Body fluid culture w Gram Stain     Status: None   Collection Time: 07/03/23  4:39 PM   Specimen: Peritoneal Cavity; Peritoneal Fluid  Result Value Ref Range Status   Specimen Description PERITONEAL CAVITY  Final   Special Requests NONE  Final   Gram Stain NO WBC SEEN NO ORGANISMS SEEN   Final   Culture   Final    NO GROWTH 3 DAYS Performed at [facility], [address] [place], [place] [address]    Report Status 07/07/2023 FINAL  Final     Labs: BNP (last 3 results) Recent Labs    07/06/23 1541  BNP 134.4*   Basic Metabolic Panel: Recent Labs  Lab 07/04/23  1701 07/05/23 0356 07/05/23 0954 07/06/23 0353 07/07/23 0743  NA 121* 122* 122*  122* 125*  K 5.0 4.9 5.0 5.4* 4.9  CL 91* 93* 93* 92* 96*  CO2 20* 19* 19* 20* 21*  GLUCOSE 94 96 93 89 81  BUN 21* 19 22* 25* 26*  CREATININE 2.16* 2.15* 2.32* 2.29* 1.90*  CALCIUM 9.0 8.9 8.8* 8.9 8.6*   Liver Function Tests: Recent Labs  Lab 07/03/23 1354 07/04/23 0551 07/05/23 0954 07/06/23 0353  AST 75* 66* 63* 47*  ALT 50* 48* 46* 38  ALKPHOS 125 108 115 96  BILITOT 3.6* 3.6* 2.2* 2.7*  PROT 6.0* 5.8* 5.8* 5.9*  ALBUMIN 2.9* 3.0* 2.9* 3.7   No results for input(s): "LIPASE", "AMYLASE" in the last 168 hours. Recent Labs  Lab 07/03/23 1354 07/06/23 1541 07/07/23 0743  AMMONIA 23 33 32   CBC: Recent Labs  Lab 07/03/23 1354 07/04/23 0551 07/05/23 0356 07/06/23 0353 07/07/23 0743  WBC 6.6 6.1 8.3 7.0 6.4  NEUTROABS 5.2  --  6.5  --  4.5  HGB 9.8* 9.1* 9.5* 8.4* 8.3*  HCT 28.1* 25.2* 26.9* 24.3* 24.0*  MCV 89.2 88.4 88.8 90.0 92.0  PLT 123* 100* 107* 88* 96*   Cardiac Enzymes: No results for input(s): "CKTOTAL", "CKMB", "CKMBINDEX", "TROPONINI" in the last 168 hours. BNP: Invalid input(s): "POCBNP" CBG: No results for input(s): "GLUCAP" in the last 168 hours. D-Dimer No results for input(s): "DDIMER" in the last 72 hours. Hgb A1c No results for input(s): "HGBA1C" in the last 72 hours. Lipid Profile No results for input(s): "CHOL", "HDL", "LDLCALC", "TRIG", "CHOLHDL", "LDLDIRECT" in the last 72 hours. Thyroid function studies No results for input(s): "TSH", "T4TOTAL", "T3FREE", "THYROIDAB" in the last 72 hours.  Invalid input(s): "FREET3" Anemia work up No results for input(s): "VITAMINB12", "FOLATE", "FERRITIN", "TIBC", "IRON", "RETICCTPCT" in the last 72 hours. Urinalysis    Component Value Date/Time   COLORURINE YELLOW 07/04/2023 2205   APPEARANCEUR HAZY (A) 07/04/2023 2205   LABSPEC 1.025 07/04/2023 2205   PHURINE 5.0 07/04/2023 2205   GLUCOSEU NEGATIVE 07/04/2023 2205   HGBUR LARGE (A) 07/04/2023 2205   BILIRUBINUR NEGATIVE 07/04/2023 2205    KETONESUR NEGATIVE 07/04/2023 2205   PROTEINUR NEGATIVE 07/04/2023 2205   NITRITE NEGATIVE 07/04/2023 2205   LEUKOCYTESUR LARGE (A) 07/04/2023 2205   Sepsis Labs Recent Labs  Lab 07/04/23 0551 07/05/23 0356 07/06/23 0353 07/07/23 0743  WBC 6.1 8.3 7.0 6.4   Microbiology Recent Results (from the past 240 hours)  Body fluid culture w Gram Stain     Status: None   Collection Time: 07/03/23  4:39 PM   Specimen: Peritoneal Cavity; Peritoneal Fluid  Result Value Ref Range Status   Specimen Description PERITONEAL CAVITY  Final   Special Requests NONE  Final   Gram Stain NO WBC SEEN NO ORGANISMS SEEN   Final   Culture   Final    NO GROWTH 3 DAYS Performed at [facility], [address] [place], [place] [address]    Report Status 07/07/2023 FINAL  Final     Time coordinating discharge: 35 minutes  SIGNED:   Erick Blinks, DO [facility] 07/07/2023, 3:16 PM  If 7PM-7AM, please contact night-coverage [email or web address]

## 2023-07-07 NOTE — Progress Notes (Signed)
[facility] NEPHROLOGY PROGRESS NOTE  Assessment/ Plan: Pt is a 53 y.o. yo male  with past medical history significant for hypertension, anxiety depression, alcohol abuse with decompensated liver cirrhosis status post TIPS now undergoing liver transplant evaluation at [facility], cholecystectomy, presented with abdominal pain and worsening ascites, seen as a consultation for further evaluation and management of acute kidney injury.   # Acute kidney injury likely hepatorenal syndrome and hemodynamic changes due to recent large-volume paracentesis x2. Urine Na<10. Foley catheter for urinary retension in ER.  Patient is nonoliguric. UA with no protein, RBCs probably because of the catheter. Treating with IV albumin, midodrine and octreotide.  Continue with IV Lasix.  Starting to improve   # Decompensated liver cirrhosis with history of hepatic encephalopathy, ascites requiring frequent paracentesis, status post TIPS procedure which is currently occluded.  IR is following for occluded TIPS.  GI is following and managing.  Hopefully can get revision   # Hyponatremia related with liver cirrhosis/hypervolemic.  Continue fluid restriction.  Improving with diuretics   # Hypotension: Continue midodrine  # Hyperkalemia: Diuretics will help.  Improved  Subjective: Patient has no complaints today.  Good urine output with 1.9 L made.  Creatinine slightly improved to 1.9 Objective Vital signs in last 24 hours: Vitals:   07/06/23 1640 07/06/23 1955 07/07/23 0616 07/07/23 0729  BP: 105/63 120/73 95/62 110/73  Pulse: 89 89 100 98  Resp: 18 20 20    Temp: 98.6 F (37 C) 98.3 F (36.8 C) 98.1 F (36.7 C) 98 F (36.7 C)  TempSrc:  Oral Oral Oral  SpO2: 100% 100% 100% 100%  Weight:      Height:       Weight change:   Intake/Output Summary (Last 24 hours) at 07/07/2023 1021 Last data filed at 07/07/2023 0616 Gross per 24 hour  Intake 542.66 ml  Output 1875 ml  Net -1332.34 ml        Labs: RENAL PANEL Recent Labs  Lab 07/03/23 1354 07/04/23 0551 07/04/23 1701 07/05/23 0356 07/05/23 0954 07/06/23 0353 07/07/23 0743  NA 120* 121* 121* 122* 122* 122* 125*  K 4.6 4.7 5.0 4.9 5.0 5.4* 4.9  CL 90* 92* 91* 93* 93* 92* 96*  CO2 19* 20* 20* 19* 19* 20* 21*  GLUCOSE 104* 98 94 96 93 89 81  BUN 17 19 21* 19 22* 25* 26*  CREATININE 1.79* 1.92* 2.16* 2.15* 2.32* 2.29* 1.90*  CALCIUM 8.7* 9.0 9.0 8.9 8.8* 8.9 8.6*  ALBUMIN 2.9* 3.0*  --   --  2.9* 3.7  --     Liver Function Tests: Recent Labs  Lab 07/04/23 0551 07/05/23 0954 07/06/23 0353  AST 66* 63* 47*  ALT 48* 46* 38  ALKPHOS 108 115 96  BILITOT 3.6* 2.2* 2.7*  PROT 5.8* 5.8* 5.9*  ALBUMIN 3.0* 2.9* 3.7   No results for input(s): "LIPASE", "AMYLASE" in the last 168 hours. Recent Labs  Lab 07/03/23 1354 07/06/23 1541 07/07/23 0743  AMMONIA 23 33 32   CBC: Recent Labs    09/22/22 1034 10/19/22 1213 07/03/23 1354 07/04/23 0551 07/05/23 0356 07/06/23 0353 07/07/23 0743  HGB  --    < > 9.8* 9.1* 9.5* 8.4* 8.3*  MCV  --    < > 89.2 88.4 88.8 90.0 92.0  VITAMINB12 544  --   --   --   --   --   --   FOLATE 10.9  --   --   --   --   --   --  FERRITIN 754.8*  --   --   --   --   --   --   TIBC 184.8*  --   --   --   --   --   --   IRON 180*  --   --   --   --   --   --    < > = values in this interval not displayed.    Cardiac Enzymes: No results for input(s): "CKTOTAL", "CKMB", "CKMBINDEX", "TROPONINI" in the last 168 hours. CBG: No results for input(s): "GLUCAP" in the last 168 hours.  Iron Studies: No results for input(s): "IRON", "TIBC", "TRANSFERRIN", "FERRITIN" in the last 72 hours. Studies/Results: IR TIPS REVISION MOD SED Result Date: 07/06/2023 CLINICAL DATA:  53 year old male with history of decompensated mash cirrhosis with refractory ascites status post tips creation on 07/20/2022. The patient is admitted with recurrent large volume ascites and imaging demonstrates  occluded TIPS endograft. EXAM: 1. Ultrasound-guided vascular access of the left internal jugular vein. 2. Central venous manometry. MEDICATIONS: None. ANESTHESIA/SEDATION: Moderate (conscious) sedation was employed during this procedure. A total of Versed 1 mg and Fentanyl 50 mcg was administered intravenously. Moderate Sedation Time: 15 minutes. The patient's level of consciousness and vital signs were monitored continuously by radiology nursing throughout the procedure under my direct supervision. CONTRAST:  None. FLUOROSCOPY TIME:  Five mGy COMPLICATIONS: None immediate. PROCEDURE: Informed written consent was obtained from the patient after a thorough discussion of the procedural risks, benefits and alternatives. All questions were addressed. Maximal Sterile Barrier Technique was utilized including caps, mask, sterile gowns, sterile gloves, sterile drape, hand hygiene and skin antiseptic. A timeout was performed prior to the initiation of the procedure. Preprocedure ultrasound evaluation of the right internal jugular vein demonstrated complete occlusion with prominence of a external jugular vein, not suitable for access for this procedure. Therefore, the left neck was prepped and draped in standard fashion. Preprocedure ultrasound evaluation demonstrated patency of the left internal jugular vein. Subdermal Local anesthesia was provided at the planned needle entry site. A small skin nick was made. Under direct ultrasound visualization, the left internal jugular vein was punctured with a 21 gauge micropuncture needle. A permanent ultrasound image was captured and stored in the record. Micropuncture sheath was inserted in a Rosen wire was inserted and directed to the inferior vena cava. The micropuncture sheath was then exchanged for a 6 French, 45 cm angled tip sheath. The sheath tip was positioned in the right atrium. Central manometry was performed with a in right atrial pressure ranging from 23-31 mmHg with  respiratory variation. The wire and sheath were then removed and manual compression was applied to achieve hemostasis. A sterile bandage was applied. IMPRESSION: Elevated right atrial mean pressure (31 mm Hg) unsafe for recanalization of indwelling TIPS due to threat of right heart overload. Recommend Cardiology consultation. If right heart pressures able to be lowered to normal levels, TIPS recanalization can be revisited in the future. Marliss Coots, MD [facility] Radiology Electronically Signed   By: Marliss Coots M.D.   On: 07/06/2023 19:14   IR US Guide Vasc Access Right Result Date: 07/06/2023 CLINICAL DATA:  53 year old male with history of decompensated mash cirrhosis with refractory ascites status post tips creation on 07/20/2022. The patient is admitted with recurrent large volume ascites and imaging demonstrates occluded TIPS endograft. EXAM: 1. Ultrasound-guided vascular access of the left internal jugular vein. 2. Central venous manometry. MEDICATIONS: None. ANESTHESIA/SEDATION: Moderate (conscious)  sedation was employed during this procedure. A total of Versed 1 mg and Fentanyl 50 mcg was administered intravenously. Moderate Sedation Time: 15 minutes. The patient's level of consciousness and vital signs were monitored continuously by radiology nursing throughout the procedure under my direct supervision. CONTRAST:  None. FLUOROSCOPY TIME:  Five mGy COMPLICATIONS: None immediate. PROCEDURE: Informed written consent was obtained from the patient after a thorough discussion of the procedural risks, benefits and alternatives. All questions were addressed. Maximal Sterile Barrier Technique was utilized including caps, mask, sterile gowns, sterile gloves, sterile drape, hand hygiene and skin antiseptic. A timeout was performed prior to the initiation of the procedure. Preprocedure ultrasound evaluation of the right internal jugular vein demonstrated  complete occlusion with prominence of a external jugular vein, not suitable for access for this procedure. Therefore, the left neck was prepped and draped in standard fashion. Preprocedure ultrasound evaluation demonstrated patency of the left internal jugular vein. Subdermal Local anesthesia was provided at the planned needle entry site. A small skin nick was made. Under direct ultrasound visualization, the left internal jugular vein was punctured with a 21 gauge micropuncture needle. A permanent ultrasound image was captured and stored in the record. Micropuncture sheath was inserted in a Rosen wire was inserted and directed to the inferior vena cava. The micropuncture sheath was then exchanged for a 6 French, 45 cm angled tip sheath. The sheath tip was positioned in the right atrium. Central manometry was performed with a in right atrial pressure ranging from 23-31 mmHg with respiratory variation. The wire and sheath were then removed and manual compression was applied to achieve hemostasis. A sterile bandage was applied. IMPRESSION: Elevated right atrial mean pressure (31 mm Hg) unsafe for recanalization of indwelling TIPS due to threat of right heart overload. Recommend Cardiology consultation. If right heart pressures able to be lowered to normal levels, TIPS recanalization can be revisited in the future. Marliss Coots, MD [facility] Radiology Electronically Signed   By: Marliss Coots M.D.   On: 07/06/2023 19:14   ECHOCARDIOGRAM COMPLETE BUBBLE STUDY Result Date: 07/06/2023    ECHOCARDIOGRAM REPORT   Patient Name:   Erik Howe Date of Exam: 07/06/2023 Medical Rec #:  [id number]    Height:       73.0 in Accession #:    [id number]   Weight:       278.0 lb Date of Birth:  Jun 29, 1970    BSA:          2.475 m Patient Age:    52 years     BP:           105/63 mmHg Patient Gender: M            HR:           90 bpm. Exam Location:  Inpatient Procedure: 2D Echo, Color  Doppler, Cardiac Doppler and Saline Contrast Bubble            Study Indications:    atrial septal defect  History:        Patient has prior history of Echocardiogram examinations, most                 recent 07/08/2022. Signs/Symptoms:Dyspnea.  Sonographer:    Delcie Roch RDCS Referring Phys: [phone] AVA SWAYZE  Sonographer Comments: Patient is obese. Image acquisition challenging due to patient body habitus. IMPRESSIONS  1. Left ventricular ejection fraction, by estimation, is 70 to 75%. The left ventricle has hyperdynamic function.  The left ventricle has no regional wall motion abnormalities. Left ventricular diastolic parameters were normal.  2. Right ventricular systolic function is normal. The right ventricular size is normal.  3. The mitral valve is normal in structure. No evidence of mitral valve regurgitation. No evidence of mitral stenosis.  4. The aortic valve is tricuspid. There is mild calcification of the aortic valve. Aortic valve regurgitation is not visualized. No aortic stenosis is present.  5. The inferior vena cava is normal in size with greater than 50% respiratory variability, suggesting right atrial pressure of 3 mmHg.  6. Agitated saline contrast bubble study was negative, with no evidence of any interatrial shunt. FINDINGS  Left Ventricle: Left ventricular ejection fraction, by estimation, is 70 to 75%. The left ventricle has hyperdynamic function. The left ventricle has no regional wall motion abnormalities. The left ventricular internal cavity size was normal in size. There is no left ventricular hypertrophy. Left ventricular diastolic parameters were normal. Right Ventricle: The right ventricular size is normal. No increase in right ventricular wall thickness. Right ventricular systolic function is normal. Left Atrium: Left atrial size was normal in size. Right Atrium: Right atrial size was normal in size. Pericardium: There is no evidence of pericardial effusion. Mitral Valve: The  mitral valve is normal in structure. No evidence of mitral valve regurgitation. No evidence of mitral valve stenosis. Tricuspid Valve: The tricuspid valve is normal in structure. Tricuspid valve regurgitation is trivial. No evidence of tricuspid stenosis. Aortic Valve: The aortic valve is tricuspid. There is mild calcification of the aortic valve. Aortic valve regurgitation is not visualized. No aortic stenosis is present. Pulmonic Valve: The pulmonic valve was normal in structure. Pulmonic valve regurgitation is not visualized. No evidence of pulmonic stenosis. Aorta: The aortic root is normal in size and structure. Venous: The inferior vena cava is normal in size with greater than 50% respiratory variability, suggesting right atrial pressure of 3 mmHg. IAS/Shunts: No atrial level shunt detected by color flow Doppler. Agitated saline contrast was given intravenously to evaluate for intracardiac shunting. Agitated saline contrast bubble study was negative, with no evidence of any interatrial shunt.  LEFT VENTRICLE PLAX 2D LVIDd:         5.20 cm   Diastology LVIDs:         3.00 cm   LV e' medial:    8.05 cm/s LV PW:         0.90 cm   LV E/e' medial:  11.9 LV IVS:        1.00 cm   LV e' lateral:   15.25 cm/s LVOT diam:     2.20 cm   LV E/e' lateral: 6.3 LVOT Area:     3.80 cm  RIGHT VENTRICLE             IVC RV Basal diam:  3.10 cm     IVC diam: 1.50 cm RV S prime:     21.00 cm/s TAPSE (M-mode): 2.6 cm LEFT ATRIUM             Index        RIGHT ATRIUM           Index LA diam:        3.50 cm 1.41 cm/m   RA Area:     17.90 cm LA Vol (A2C):   74.7 ml 30.18 ml/m  RA Volume:   48.80 ml  19.72 ml/m LA Vol (A4C):   80.5 ml 32.53 ml/m LA Biplane Vol: 79.6  ml 32.16 ml/m   AORTA Ao Root diam: 3.20 cm MITRAL VALVE MV Area (PHT): 2.99 cm     SHUNTS MV Decel Time: 254 msec     Systemic Diam: 2.20 cm MV E velocity: 95.90 cm/s MV A velocity: 115.00 cm/s MV E/A ratio:  0.83 Arvilla Meres MD Electronically signed by Arvilla Meres MD Signature Date/Time: 07/06/2023/7:06:55 PM    Final     Medications: Infusions:  octreotide (SANDOSTATIN) 500 mcg in sodium chloride 0.9 % 250 mL (2 mcg/mL) infusion 50 mcg/hr (07/07/23 0453)    Scheduled Medications:  amitriptyline  10 mg Oral QHS   Chlorhexidine Gluconate Cloth  6 each Topical Daily   feeding supplement  237 mL Oral BID BM   furosemide  40 mg Intravenous Q12H   lactulose  30 g Oral QID   midodrine  10 mg Oral TID WC   pantoprazole  40 mg Oral BID   rifaximin  550 mg Oral BID    have reviewed scheduled and prn medications.  Physical Exam: General: Lying in bed, no distress Heart: Normal rate, no murmur Lungs: Bilateral chest rise with no increased work of breathing Abdomen: Soft but distended. Extremities: Bilateral leg peripheral edema presents++ Neurology: Awake and alert Bernestine Amass Earlean Fidalgo 07/07/2023,10:21 AM  LOS: 3 days

## 2023-07-08 DIAGNOSIS — N179 Acute kidney failure, unspecified: Secondary | ICD-10-CM | POA: Diagnosis not present

## 2023-07-08 DIAGNOSIS — E871 Hypo-osmolality and hyponatremia: Secondary | ICD-10-CM

## 2023-07-08 DIAGNOSIS — K7460 Unspecified cirrhosis of liver: Secondary | ICD-10-CM | POA: Diagnosis not present

## 2023-07-08 DIAGNOSIS — K7290 Hepatic failure, unspecified without coma: Secondary | ICD-10-CM | POA: Diagnosis not present

## 2023-07-08 LAB — BASIC METABOLIC PANEL
Anion gap: 8 (ref 5–15)
BUN: 26 mg/dL — ABNORMAL HIGH (ref 6–20)
CO2: 21 mmol/L — ABNORMAL LOW (ref 22–32)
Calcium: 8.7 mg/dL — ABNORMAL LOW (ref 8.9–10.3)
Chloride: 95 mmol/L — ABNORMAL LOW (ref 98–111)
Creatinine, Ser: 1.59 mg/dL — ABNORMAL HIGH (ref 0.61–1.24)
GFR, Estimated: 52 mL/min — ABNORMAL LOW (ref >60)
Glucose, Bld: 121 mg/dL — ABNORMAL HIGH (ref 70–99)
Potassium: 4.5 mmol/L (ref 3.5–5.1)
Sodium: 124 mmol/L — ABNORMAL LOW (ref 135–145)

## 2023-07-08 NOTE — Progress Notes (Addendum)
Daily Progress Note  DOA: 07/03/2023 Hospital Day: 6   Chief Complaint:  Decompensated cirrhosis   ASSESSMENT    Brief Narrative:  Erik Howe is a 53 y.o. year old male with a history of  Etoh abuse, decompensated cirrhosis followed by [facility], chronic loose stool, cholecystectomy, multiple polyps including an advanced polyp(s), colon AMV, NSAID induced duodenal ulcers, severe esophagitis, hiatal hernia . Admitted with decompensated cirrhosis and weakness. Found to have occluded TIPS   Decompensated MASH cirrhosis s/p TIPS.   Admitted with abdominal pain / SHOB , large volume ascites, TIPS occlusion.  MELD 3.0: 29 at 07/07/2023  7:43 AM Thrombectomy and TIPS revision wasn't able to be done as right atrial pressures were discordant with echocardiogram from 07/08/22. Cardiology has evaluated. Repeat Echo showing normal diastolic function and the RA pressure is low. Right heart cath felt not to be needed.  AKI , secondary to intravascular volume depletion post TIPS x 2, diuretics and HRS.   Ur sodium < 10 . Nephrology following. Cr improving on HRS treatment of albumin, midodrine and sandostatin.  Creatinine appears to be dramatically improving.  Abdominal distention.  KUB showed ileus.  Narcotics have been stopped.  Patient is continue to pass stools.  Principal Problem:   Decompensated cirrhosis (HCC) Active Problems:   Hyponatremia   AKI (acute kidney injury) (HCC)   PLAN   --Continue midodrine and octreotide.  Holding on albumin now --Patient is getting some diuresis with IV Lasix to help with fluid overload.  Will see if the patient's kidneys are able to tolerate this --Low-sodium diet <2 grams per day --Continue lactulose and rifaximin for treatment of hepatic encephalopathy --Avoiding opioids in the setting of ileus --Ordered PT and OT to try to get the patient moving, which will also help with improving his ileus -- Discussed this patient with [facility]  hepatology and they would favor holding off on TIPS revision for now since patient will likely get a transfer in the near future --I spoke to the Sister Tammi today to update her  Subjective   Still [facility].  Patient states that he passed several stools.  Patient is opioid medications have been stopped.   Objective    Recent Labs    07/06/23 0353 07/07/23 0743  WBC 7.0 6.4  HGB 8.4* 8.3*  HCT 24.3* 24.0*  PLT 88* 96*   BMET Recent Labs    07/06/23 0353 07/07/23 0743 07/08/23 0944  NA 122* 125* 124*  K 5.4* 4.9 4.5  CL 92* 96* 95*  CO2 20* 21* 21*  GLUCOSE 89 81 121*  BUN 25* 26* 26*  CREATININE 2.29* 1.90* 1.59*  CALCIUM 8.9 8.6* 8.7*   LFT Recent Labs    07/06/23 0353  PROT 5.9*  ALBUMIN 3.7  AST 47*  ALT 38  ALKPHOS 96  BILITOT 2.7*   PT/INR Recent Labs    07/06/23 0353  LABPROT 20.4*  INR 1.7*     Imaging:  DG Abd 1 View CLINICAL DATA:  [id number] Abdominal distention [id number]  EXAM: ABDOMEN - 1 VIEW  COMPARISON:  07/04/2023  FINDINGS: Diffuse gaseous distension of the colon. No abnormally dilated loops of small bowel are evident. No free intraperitoneal identified. No radio-opaque calculi or other significant radiographic abnormality are seen.  IMPRESSION: Diffuse gaseous distension of the colon, suggestive of ileus.  Electronically Signed   By: Duanne Guess D.O.   On: 07/07/2023 19:00     Scheduled inpatient  medications:   amitriptyline  10 mg Oral QHS   Chlorhexidine Gluconate Cloth  6 each Topical Daily   feeding supplement  237 mL Oral BID BM   furosemide  40 mg Intravenous Q12H   lactulose  30 g Oral QID   midodrine  10 mg Oral TID WC   pantoprazole  40 mg Oral BID   rifaximin  550 mg Oral BID   Continuous inpatient infusions:   octreotide (SANDOSTATIN) 500 mcg in sodium chloride 0.9 % 250 mL (2 mcg/mL) infusion 50 mcg/hr (07/08/23 1147)   PRN inpatient medications: acetaminophen **OR** acetaminophen,  iohexol, ondansetron **OR** ondansetron (ZOFRAN) IV, mouth rinse  Vital signs in last 24 hours: Temp:  [97.5 F (36.4 C)-98 F (36.7 C)] 98 F (36.7 C) (01/18 1523) Pulse Rate:  [89-102] 89 (01/18 1523) Resp:  [18-20] 18 (01/18 0704) BP: (114-129)/(68-79) 121/79 (01/18 1523) SpO2:  [100 %] 100 % (01/18 1523) Last BM Date : 07/08/23  Intake/Output Summary (Last 24 hours) at 07/08/2023 1829 Last data filed at 07/08/2023 1700 Gross per 24 hour  Intake 1228.7 ml  Output 2100 ml  Net -871.3 ml    Intake/Output from previous day: 01/17 0701 - 01/18 0700 In: 888.7 [P.O.:120; I.V.:768.7] Out: 1100 [Urine:1100] Intake/Output this shift: Total I/O In: 340 [P.O.:340] Out: 1000 [Urine:1000]   Physical Exam:  General: Alert male in NAD Heart:  Regular rate and rhythm.  Pulmonary: Normal respiratory effort Abdomen: Soft, distended, tympanic, non-tender Extremities: No significant lower extremity edema  Neurologic: Alert and oriented Psych: Pleasant. Cooperative.     LOS: 4 days   Imogene Burn ,MD 07/08/2023, 6:29 PM

## 2023-07-08 NOTE — Plan of Care (Signed)

## 2023-07-08 NOTE — Progress Notes (Signed)
[facility] NEPHROLOGY PROGRESS NOTE  Assessment/ Plan: Pt is a 53 y.o. yo male  with past medical history significant for hypertension, anxiety depression, alcohol abuse with decompensated liver cirrhosis status post TIPS now undergoing liver transplant evaluation at [facility], cholecystectomy, presented with abdominal pain and worsening ascites, seen as a consultation for further evaluation and management of acute kidney injury.   # Acute kidney injury likely hepatorenal syndrome and hemodynamic changes due to recent large-volume paracentesis x2. Urine Na<10. Foley catheter for urinary retension in ER.   UA with no protein, RBCs probably because of the catheter. Improving with albumin midodrine and octreotide.  IV Lasix for volume excess.  Follow-up labs today but if improving likely will sign off   # Decompensated liver cirrhosis with history of hepatic encephalopathy, ascites requiring frequent paracentesis, status post TIPS procedure which is currently occluded.  IR is following for occluded TIPS.  GI is following and managing.  Hopefully can get revision.  Awaiting transfer to [facility]   # Hyponatremia related with liver cirrhosis/hypervolemic.  Continue fluid restriction.  Improving with diuretics.  Follow-up labs today   # Hypotension: Continue midodrine  # Hyperkalemia: Improved with diuretics  Subjective: Patient feels well with no complaints  Objective Vital signs in last 24 hours: Vitals:   07/07/23 1600 07/07/23 2100 07/08/23 0421 07/08/23 0704  BP: 120/73 129/72 127/73 114/68  Pulse: 90 91 98 (!) 102  Resp:  20 20 18   Temp: 98 F (36.7 C) (!) 97.5 F (36.4 C) 97.8 F (36.6 C) 98 F (36.7 C)  TempSrc: Oral Oral Oral Oral  SpO2: 100% 100% 100% 100%  Weight:      Height:       Weight change:   Intake/Output Summary (Last 24 hours) at 07/08/2023 0917 Last data filed at 07/08/2023 0600 Gross per 24 hour  Intake 888.7 ml  Output 1100 ml  Net -211.3  ml       Labs: RENAL PANEL Recent Labs  Lab 07/03/23 1354 07/04/23 0551 07/04/23 1701 07/05/23 0356 07/05/23 0954 07/06/23 0353 07/07/23 0743  NA 120* 121* 121* 122* 122* 122* 125*  K 4.6 4.7 5.0 4.9 5.0 5.4* 4.9  CL 90* 92* 91* 93* 93* 92* 96*  CO2 19* 20* 20* 19* 19* 20* 21*  GLUCOSE 104* 98 94 96 93 89 81  BUN 17 19 21* 19 22* 25* 26*  CREATININE 1.79* 1.92* 2.16* 2.15* 2.32* 2.29* 1.90*  CALCIUM 8.7* 9.0 9.0 8.9 8.8* 8.9 8.6*  ALBUMIN 2.9* 3.0*  --   --  2.9* 3.7  --     Liver Function Tests: Recent Labs  Lab 07/04/23 0551 07/05/23 0954 07/06/23 0353  AST 66* 63* 47*  ALT 48* 46* 38  ALKPHOS 108 115 96  BILITOT 3.6* 2.2* 2.7*  PROT 5.8* 5.8* 5.9*  ALBUMIN 3.0* 2.9* 3.7   No results for input(s): "LIPASE", "AMYLASE" in the last 168 hours. Recent Labs  Lab 07/03/23 1354 07/06/23 1541 07/07/23 0743  AMMONIA 23 33 32   CBC: Recent Labs    09/22/22 1034 10/19/22 1213 07/03/23 1354 07/04/23 0551 07/05/23 0356 07/06/23 0353 07/07/23 0743  HGB  --    < > 9.8* 9.1* 9.5* 8.4* 8.3*  MCV  --    < > 89.2 88.4 88.8 90.0 92.0  VITAMINB12 544  --   --   --   --   --   --   FOLATE 10.9  --   --   --   --   --   --  FERRITIN 754.8*  --   --   --   --   --   --   TIBC 184.8*  --   --   --   --   --   --   IRON 180*  --   --   --   --   --   --    < > = values in this interval not displayed.    Cardiac Enzymes: No results for input(s): "CKTOTAL", "CKMB", "CKMBINDEX", "TROPONINI" in the last 168 hours. CBG: No results for input(s): "GLUCAP" in the last 168 hours.  Iron Studies: No results for input(s): "IRON", "TIBC", "TRANSFERRIN", "FERRITIN" in the last 72 hours. Studies/Results: DG Abd 1 View Result Date: 07/07/2023 CLINICAL DATA:  [id number] Abdominal distention [id number] EXAM: ABDOMEN - 1 VIEW COMPARISON:  07/04/2023 FINDINGS: Diffuse gaseous distension of the colon. No abnormally dilated loops of small bowel are evident. No free intraperitoneal identified. No  radio-opaque calculi or other significant radiographic abnormality are seen. IMPRESSION: Diffuse gaseous distension of the colon, suggestive of ileus. Electronically Signed   By: Duanne Guess D.O.   On: 07/07/2023 19:00   IR TIPS REVISION MOD SED Result Date: 07/06/2023 CLINICAL DATA:  53 year old male with history of decompensated mash cirrhosis with refractory ascites status post tips creation on 07/20/2022. The patient is admitted with recurrent large volume ascites and imaging demonstrates occluded TIPS endograft. EXAM: 1. Ultrasound-guided vascular access of the left internal jugular vein. 2. Central venous manometry. MEDICATIONS: None. ANESTHESIA/SEDATION: Moderate (conscious) sedation was employed during this procedure. A total of Versed 1 mg and Fentanyl 50 mcg was administered intravenously. Moderate Sedation Time: 15 minutes. The patient's level of consciousness and vital signs were monitored continuously by radiology nursing throughout the procedure under my direct supervision. CONTRAST:  None. FLUOROSCOPY TIME:  Five mGy COMPLICATIONS: None immediate. PROCEDURE: Informed written consent was obtained from the patient after a thorough discussion of the procedural risks, benefits and alternatives. All questions were addressed. Maximal Sterile Barrier Technique was utilized including caps, mask, sterile gowns, sterile gloves, sterile drape, hand hygiene and skin antiseptic. A timeout was performed prior to the initiation of the procedure. Preprocedure ultrasound evaluation of the right internal jugular vein demonstrated complete occlusion with prominence of a external jugular vein, not suitable for access for this procedure. Therefore, the left neck was prepped and draped in standard fashion. Preprocedure ultrasound evaluation demonstrated patency of the left internal jugular vein. Subdermal Local anesthesia was provided at the planned needle entry site. A small skin nick was made. Under direct  ultrasound visualization, the left internal jugular vein was punctured with a 21 gauge micropuncture needle. A permanent ultrasound image was captured and stored in the record. Micropuncture sheath was inserted in a Rosen wire was inserted and directed to the inferior vena cava. The micropuncture sheath was then exchanged for a 6 French, 45 cm angled tip sheath. The sheath tip was positioned in the right atrium. Central manometry was performed with a in right atrial pressure ranging from 23-31 mmHg with respiratory variation. The wire and sheath were then removed and manual compression was applied to achieve hemostasis. A sterile bandage was applied. IMPRESSION: Elevated right atrial mean pressure (31 mm Hg) unsafe for recanalization of indwelling TIPS due to threat of right heart overload. Recommend Cardiology consultation. If right heart pressures able to be lowered to normal levels, TIPS recanalization can be revisited in the future. Marliss Coots, MD [facility] Radiology Electronically Signed  By: Marliss Coots M.D.   On: 07/06/2023 19:14   IR US Guide Vasc Access Right Result Date: 07/06/2023 CLINICAL DATA:  53 year old male with history of decompensated mash cirrhosis with refractory ascites status post tips creation on 07/20/2022. The patient is admitted with recurrent large volume ascites and imaging demonstrates occluded TIPS endograft. EXAM: 1. Ultrasound-guided vascular access of the left internal jugular vein. 2. Central venous manometry. MEDICATIONS: None. ANESTHESIA/SEDATION: Moderate (conscious) sedation was employed during this procedure. A total of Versed 1 mg and Fentanyl 50 mcg was administered intravenously. Moderate Sedation Time: 15 minutes. The patient's level of consciousness and vital signs were monitored continuously by radiology nursing throughout the procedure under my direct supervision. CONTRAST:  None. FLUOROSCOPY TIME:  Five mGy  COMPLICATIONS: None immediate. PROCEDURE: Informed written consent was obtained from the patient after a thorough discussion of the procedural risks, benefits and alternatives. All questions were addressed. Maximal Sterile Barrier Technique was utilized including caps, mask, sterile gowns, sterile gloves, sterile drape, hand hygiene and skin antiseptic. A timeout was performed prior to the initiation of the procedure. Preprocedure ultrasound evaluation of the right internal jugular vein demonstrated complete occlusion with prominence of a external jugular vein, not suitable for access for this procedure. Therefore, the left neck was prepped and draped in standard fashion. Preprocedure ultrasound evaluation demonstrated patency of the left internal jugular vein. Subdermal Local anesthesia was provided at the planned needle entry site. A small skin nick was made. Under direct ultrasound visualization, the left internal jugular vein was punctured with a 21 gauge micropuncture needle. A permanent ultrasound image was captured and stored in the record. Micropuncture sheath was inserted in a Rosen wire was inserted and directed to the inferior vena cava. The micropuncture sheath was then exchanged for a 6 French, 45 cm angled tip sheath. The sheath tip was positioned in the right atrium. Central manometry was performed with a in right atrial pressure ranging from 23-31 mmHg with respiratory variation. The wire and sheath were then removed and manual compression was applied to achieve hemostasis. A sterile bandage was applied. IMPRESSION: Elevated right atrial mean pressure (31 mm Hg) unsafe for recanalization of indwelling TIPS due to threat of right heart overload. Recommend Cardiology consultation. If right heart pressures able to be lowered to normal levels, TIPS recanalization can be revisited in the future. Marliss Coots, MD [facility] Radiology Electronically Signed    By: Marliss Coots M.D.   On: 07/06/2023 19:14   ECHOCARDIOGRAM COMPLETE BUBBLE STUDY Result Date: 07/06/2023    ECHOCARDIOGRAM REPORT   Patient Name:   Erik Howe Date of Exam: 07/06/2023 Medical Rec #:  [id number]    Height:       73.0 in Accession #:    [id number]   Weight:       278.0 lb Date of Birth:  1971/05/23    BSA:          2.475 m Patient Age:    52 years     BP:           105/63 mmHg Patient Gender: M            HR:           90 bpm. Exam Location:  Inpatient Procedure: 2D Echo, Color Doppler, Cardiac Doppler and Saline Contrast Bubble            Study Indications:    atrial septal defect  History:  Patient has prior history of Echocardiogram examinations, most                 recent 07/08/2022. Signs/Symptoms:Dyspnea.  Sonographer:    Delcie Roch RDCS Referring Phys: [phone] AVA SWAYZE  Sonographer Comments: Patient is obese. Image acquisition challenging due to patient body habitus. IMPRESSIONS  1. Left ventricular ejection fraction, by estimation, is 70 to 75%. The left ventricle has hyperdynamic function. The left ventricle has no regional wall motion abnormalities. Left ventricular diastolic parameters were normal.  2. Right ventricular systolic function is normal. The right ventricular size is normal.  3. The mitral valve is normal in structure. No evidence of mitral valve regurgitation. No evidence of mitral stenosis.  4. The aortic valve is tricuspid. There is mild calcification of the aortic valve. Aortic valve regurgitation is not visualized. No aortic stenosis is present.  5. The inferior vena cava is normal in size with greater than 50% respiratory variability, suggesting right atrial pressure of 3 mmHg.  6. Agitated saline contrast bubble study was negative, with no evidence of any interatrial shunt. FINDINGS  Left Ventricle: Left ventricular ejection fraction, by estimation, is 70 to 75%. The left ventricle has hyperdynamic function. The left ventricle has no regional wall motion  abnormalities. The left ventricular internal cavity size was normal in size. There is no left ventricular hypertrophy. Left ventricular diastolic parameters were normal. Right Ventricle: The right ventricular size is normal. No increase in right ventricular wall thickness. Right ventricular systolic function is normal. Left Atrium: Left atrial size was normal in size. Right [facility]: Right atrial size was normal in size. Pericardium: There is no evidence of pericardial effusion. Mitral Valve: The mitral valve is normal in structure. No evidence of mitral valve regurgitation. No evidence of mitral valve stenosis. Tricuspid Valve: The tricuspid valve is normal in structure. Tricuspid valve regurgitation is trivial. No evidence of tricuspid stenosis. Aortic Valve: The aortic valve is tricuspid. There is mild calcification of the aortic valve. Aortic valve regurgitation is not visualized. No aortic stenosis is present. Pulmonic Valve: The pulmonic valve was normal in structure. Pulmonic valve regurgitation is not visualized. No evidence of pulmonic stenosis. Aorta: The aortic root is normal in size and structure. Venous: The inferior vena cava is normal in size with greater than 50% respiratory variability, suggesting right atrial pressure of 3 mmHg. IAS/Shunts: No atrial level shunt detected by color flow Doppler. Agitated saline contrast was given intravenously to evaluate for intracardiac shunting. Agitated saline contrast bubble study was negative, with no evidence of any interatrial shunt.  LEFT VENTRICLE PLAX 2D LVIDd:         5.20 cm   Diastology LVIDs:         3.00 cm   LV e' medial:    8.05 cm/s LV PW:         0.90 cm   LV E/e' medial:  11.9 LV IVS:        1.00 cm   LV e' lateral:   15.25 cm/s LVOT diam:     2.20 cm   LV E/e' lateral: 6.3 LVOT Area:     3.80 cm  RIGHT VENTRICLE             IVC RV Basal diam:  3.10 cm     IVC diam: 1.50 cm RV S prime:     21.00 cm/s TAPSE (M-mode): 2.6 cm LEFT ATRIUM              Index  RIGHT ATRIUM           Index LA diam:        3.50 cm 1.41 cm/m   RA Area:     17.90 cm LA Vol (A2C):   74.7 ml 30.18 ml/m  RA Volume:   48.80 ml  19.72 ml/m LA Vol (A4C):   80.5 ml 32.53 ml/m LA Biplane Vol: 79.6 ml 32.16 ml/m   AORTA Ao Root diam: 3.20 cm MITRAL VALVE MV Area (PHT): 2.99 cm     SHUNTS MV Decel Time: 254 msec     Systemic Diam: 2.20 cm MV E velocity: 95.90 cm/s MV A velocity: 115.00 cm/s MV E/A ratio:  0.83 Arvilla Meres MD Electronically signed by Arvilla Meres MD Signature Date/Time: 07/06/2023/7:06:55 PM    Final     Medications: Infusions:  octreotide (SANDOSTATIN) 500 mcg in sodium chloride 0.9 % 250 mL (2 mcg/mL) infusion 50 mcg/hr (07/08/23 0140)    Scheduled Medications:  amitriptyline  10 mg Oral QHS   Chlorhexidine Gluconate Cloth  6 each Topical Daily   feeding supplement  237 mL Oral BID BM   furosemide  40 mg Intravenous Q12H   lactulose  30 g Oral QID   midodrine  10 mg Oral TID WC   pantoprazole  40 mg Oral BID   rifaximin  550 mg Oral BID    have reviewed scheduled and prn medications.  Physical Exam: General: Lying in bed, no distress Heart: Normal rate, no murmur Lungs: Bilateral chest rise with no increased work of breathing Abdomen: Soft but distended. Extremities: Bilateral leg peripheral edema presents++ Neurology: Awake and alert Bernestine Amass Acheron Sugg 07/08/2023,9:17 AM  LOS: 4 days

## 2023-07-08 NOTE — Discharge Summary (Signed)
Physician Discharge Summary  Erik Howe UJW:[id number] DOB: [birth year] DOA: 07/03/2023  PCP: [name], PA-C  Admit date: 07/03/2023  Transfer date: 07/08/2023  Admitted From:Home  Disposition:  Alver Fisher  Recommendations for Outpatient Follow-up:  Follow up with Hepatology at [facility] Health:None  Equipment/Devices:None  Discharge Condition:Stable  CODE STATUS: Full  Diet recommendation: Heart Healthy  Brief/Interim Summary:  The patient is a 53 yr old man who presented to [facility] on 07/03/2023 with complaints of progressively worsening shortness of breath and abdominal distention. 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 Since that procedure the patient has had multiple paracentesis ( about q2 weeks), and he has been admitted for hepatic encephalopathy. 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 This was suspected to be related to TIPS occlusion, resulting in rapid reaccumulation of fluid. Revision of the TIPS was attempted by IR on 1/16, but right heart pressures were elevated and therefore, Cardiology was asked to evaluate. Fortunately, echo on 1/16 does not show this and no further workup is needed. [facility] transplant team was notified of this patient's hospitalization and is requesting transfer to their facility for further care. He continues to remain on Lasix for diuresis along with albumin. He is now in stable condition for transfer with further recommendations per Hepatology.  Discharge Diagnoses:  Principal Problem:   Decompensated cirrhosis (HCC) Active Problems:   Hyponatremia   AKI (acute kidney injury) (HCC)  Decompensated cirrhosis complicated by refractory ascites -Transfer to [facility]    AKI --Creatinine decreased only slightly to 2.29. --Nephrology feels that elevated creatinine is due to hepatorenal syndrome.   Hepatic Encephalopathy Concern for hepatic encephalopathy is increased as the patient seems more somnolent today. This is despite the patient's assertion that he had 4 BM's on 07/05/2023. Will increase lactulose to QID.Will check ammonia level, although HE is a clinical diagnosis.   Insomnia-continue Elavil --The patient likely has her days and nights reversed. He is somnolent. Do not feel that this is due to hepatic encephalopathy as the patient is completely alert and oriented when he is awakened. Not difficult to awaken. Monitor.   GERD-continue Protonix   Acute Metabolic Acidosis --  Due to worsening renal function   Thrombocytopenia -  Due to decompensated hepatic cirrhosis. They have dropped to 88.  Discharge Instructions  Discharge Instructions     Diet - low sodium heart healthy   Complete by: As directed    Increase activity slowly   Complete by: As directed       Allergies as of 07/08/2023       Reactions   Tylenol [acetaminophen] Other (See Comments)   Told to avoid due to liver issues   Dust Mite Extract    Paroxetine Other (See Comments)   Stomach issues, "felt "wrong," and GI Intolerance   Pollen Extract    Tramadol Nausea Only        Medication List     TAKE these medications    albuterol 108 (90 Base) MCG/ACT inhaler Commonly known as: VENTOLIN HFA Inhale 2 puffs into the lungs every 6 (six) hours as needed for shortness of breath   amitriptyline 10 MG tablet Commonly known as: ELAVIL Take 1 tablet (10 mg total) by mouth at bedtime.   ammonium lactate 12 % lotion  Commonly known as: LAC-HYDRIN Apply topically as needed for dry skin. Twice a day   Baclofen 5 MG Tabs Take 2 tablets (10 mg total) by mouth 3 (three) times daily.   bismuth subsalicylate 262 MG/15ML suspension Commonly known as: PEPTO BISMOL Take 30 mLs  by mouth every 6 (six) hours as needed for indigestion.   clobetasol ointment 0.05 % Commonly known as: TEMOVATE Apply topically 2 (two) times a day. To affected area (right hand)   diphenhydrAMINE 50 MG tablet Commonly known as: BENADRYL Take 50 mg by mouth every 8 (eight) hours as needed for itching or allergies.   docusate sodium 100 MG capsule Commonly known as: COLACE Take 200 mg by mouth daily as needed (constipation.).   furosemide 10 MG/ML injection Commonly known as: LASIX Inject 4 mLs (40 mg total) into the vein every 12 (twelve) hours.   lactulose 10 GM/15ML solution Commonly known as: CHRONULAC Take 3.33 g by mouth 3 (three) times daily.   magnesium oxide 400 (240 Mg) MG tablet Commonly known as: MAG-OX Take 400 mg by mouth daily.   midodrine 5 MG tablet Commonly known as: PROAMATINE Take 1 tablet (5 mg total) by mouth 3 (three) times daily with meals.   ondansetron 8 MG disintegrating tablet Commonly known as: ZOFRAN-ODT Take 1 tablet (8 mg total) by mouth 3 (three) times daily as needed for nausea.   pantoprazole 40 MG tablet Commonly known as: PROTONIX Take 1 tablet (40 mg total) by mouth 2 (two) times daily.   potassium chloride SA 20 MEQ tablet Commonly known as: KLOR-CON M Take 2 tablets (40 mEq total) by mouth daily.   rifaximin 550 MG Tabs tablet Commonly known as: XIFAXAN Take 1 tablet (550 mg total) by mouth 2 (two) times daily.   spironolactone 100 MG tablet Commonly known as: ALDACTONE Take 2 tablets (200 mg total) by mouth daily.   SSD 1 % cream Generic drug: silver sulfADIAZINE Apply topically 2 (two) times a day. To affected areas What changed:  when to take this reasons to take this   sucralfate 1 g tablet Commonly known as: Carafate Take 1 tablet (1 g total) by mouth 4 (four) times daily -  with meals and at bedtime. Please dissolve the tablet in a tablespoon of water and drink the liquid 4 times a day   torsemide 20 MG  tablet Commonly known as: DEMADEX Take 2 tablets (40 mg total) by mouth daily.        Follow-up Information     Donell Beers, FNP Follow up on 07/31/2023.   Specialty: Nurse Practitioner Why: 3:20 please be  there at 3pm to fill out paperwork Contact information: [address], [place] [address] [phone]                Allergies  Allergen Reactions   Tylenol [Acetaminophen] Other (See Comments)    Told to avoid due to liver issues   Dust Mite Extract    Paroxetine Other (See Comments)    Stomach issues, "felt "wrong," and GI Intolerance   Pollen Extract    Tramadol Nausea Only    Consultations: GI IR Cardiology Nephrology   Procedures/Studies: DG Abd 1 View Result Date: 07/07/2023 CLINICAL DATA:  41324 Abdominal distention 40102 EXAM: ABDOMEN - 1 VIEW COMPARISON:  07/04/2023 FINDINGS: Diffuse gaseous distension of the colon. No abnormally dilated loops of small bowel are evident. No free intraperitoneal identified. No radio-opaque calculi or other significant radiographic abnormality are  seen. IMPRESSION: Diffuse gaseous distension of the colon, suggestive of ileus. Electronically Signed   By: Duanne Guess D.O.   On: 07/07/2023 19:00   IR TIPS REVISION MOD SED Result Date: 07/06/2023 CLINICAL DATA:  53 year old male with history of decompensated mash cirrhosis with refractory ascites status post tips creation on 07/20/2022. The patient is admitted with recurrent large volume ascites and imaging demonstrates occluded TIPS endograft. EXAM: 1. Ultrasound-guided vascular access of the left internal jugular vein. 2. Central venous manometry. MEDICATIONS: None. ANESTHESIA/SEDATION: Moderate (conscious) sedation was employed during this procedure. A total of Versed 1 mg and Fentanyl 50 mcg was administered intravenously. Moderate Sedation Time: 15 minutes. The patient's level of consciousness and vital signs were monitored continuously by  radiology nursing throughout the procedure under my direct supervision. CONTRAST:  None. FLUOROSCOPY TIME:  Five mGy COMPLICATIONS: None immediate. PROCEDURE: Informed written consent was obtained from the patient after a thorough discussion of the procedural risks, benefits and alternatives. All questions were addressed. Maximal Sterile Barrier Technique was utilized including caps, mask, sterile gowns, sterile gloves, sterile drape, hand hygiene and skin antiseptic. A timeout was performed prior to the initiation of the procedure. Preprocedure ultrasound evaluation of the right internal jugular vein demonstrated complete occlusion with prominence of a external jugular vein, not suitable for access for this procedure. Therefore, the left neck was prepped and draped in standard fashion. Preprocedure ultrasound evaluation demonstrated patency of the left internal jugular vein. Subdermal Local anesthesia was provided at the planned needle entry site. A small skin nick was made. Under direct ultrasound visualization, the left internal jugular vein was punctured with a 21 gauge micropuncture needle. A permanent ultrasound image was captured and stored in the record. Micropuncture sheath was inserted in a Rosen wire was inserted and directed to the inferior vena cava. The micropuncture sheath was then exchanged for a 6 French, 45 cm angled tip sheath. The sheath tip was positioned in the right atrium. Central manometry was performed with a in right atrial pressure ranging from 23-31 mmHg with respiratory variation. The wire and sheath were then removed and manual compression was applied to achieve hemostasis. A sterile bandage was applied. IMPRESSION: Elevated right atrial mean pressure (31 mm Hg) unsafe for recanalization of indwelling TIPS due to threat of right heart overload. Recommend Cardiology consultation. If right heart pressures able to be lowered to normal levels, TIPS recanalization can be revisited in the  future. Marliss Coots, MD [facility] Radiology Electronically Signed   By: Marliss Coots M.D.   On: 07/06/2023 19:14   IR US Guide Vasc Access Right Result Date: 07/06/2023 CLINICAL DATA:  53 year old male with history of decompensated mash cirrhosis with refractory ascites status post tips creation on 07/20/2022. The patient is admitted with recurrent large volume ascites and imaging demonstrates occluded TIPS endograft. EXAM: 1. Ultrasound-guided vascular access of the left internal jugular vein. 2. Central venous manometry. MEDICATIONS: None. ANESTHESIA/SEDATION: Moderate (conscious) sedation was employed during this procedure. A total of Versed 1 mg and Fentanyl 50 mcg was administered intravenously. Moderate Sedation Time: 15 minutes. The patient's level of consciousness and vital signs were monitored continuously by radiology nursing throughout the procedure under my direct supervision. CONTRAST:  None. FLUOROSCOPY TIME:  Five mGy COMPLICATIONS: None immediate. PROCEDURE: Informed written consent was obtained from the patient after a thorough discussion of the procedural risks, benefits and alternatives. All questions were addressed. Maximal Sterile Barrier Technique was utilized including caps, mask, sterile gowns, sterile  gloves, sterile drape, hand hygiene and skin antiseptic. A timeout was performed prior to the initiation of the procedure. Preprocedure ultrasound evaluation of the right internal jugular vein demonstrated complete occlusion with prominence of a external jugular vein, not suitable for access for this procedure. Therefore, the left neck was prepped and draped in standard fashion. Preprocedure ultrasound evaluation demonstrated patency of the left internal jugular vein. Subdermal Local anesthesia was provided at the planned needle entry site. A small skin nick was made. Under direct ultrasound visualization, the left internal jugular vein  was punctured with a 21 gauge micropuncture needle. A permanent ultrasound image was captured and stored in the record. Micropuncture sheath was inserted in a Rosen wire was inserted and directed to the inferior vena cava. The micropuncture sheath was then exchanged for a 6 French, 45 cm angled tip sheath. The sheath tip was positioned in the right atrium. Central manometry was performed with a in right atrial pressure ranging from 23-31 mmHg with respiratory variation. The wire and sheath were then removed and manual compression was applied to achieve hemostasis. A sterile bandage was applied. IMPRESSION: Elevated right atrial mean pressure (31 mm Hg) unsafe for recanalization of indwelling TIPS due to threat of right heart overload. Recommend Cardiology consultation. If right heart pressures able to be lowered to normal levels, TIPS recanalization can be revisited in the future. Marliss Coots, MD [facility] Radiology Electronically Signed   By: Marliss Coots M.D.   On: 07/06/2023 19:14   ECHOCARDIOGRAM COMPLETE BUBBLE STUDY Result Date: 07/06/2023    ECHOCARDIOGRAM REPORT   Patient Name:   Erik Howe Date of Exam: 07/06/2023 Medical Rec #:  [id number]    Height:       73.0 in Accession #:    [id number]   Weight:       278.0 lb Date of Birth:  Feb 20, 1971    BSA:          2.475 m Patient Age:    52 years     BP:           105/63 mmHg Patient Gender: M            HR:           90 bpm. Exam Location:  Inpatient Procedure: 2D Echo, Color Doppler, Cardiac Doppler and Saline Contrast Bubble            Study Indications:    atrial septal defect  History:        Patient has prior history of Echocardiogram examinations, most                 recent 07/08/2022. Signs/Symptoms:Dyspnea.  Sonographer:    Delcie Roch RDCS Referring Phys: [phone] AVA SWAYZE  Sonographer Comments: Patient is obese. Image acquisition challenging due to patient body habitus. IMPRESSIONS  1. Left  ventricular ejection fraction, by estimation, is 70 to 75%. The left ventricle has hyperdynamic function. The left ventricle has no regional wall motion abnormalities. Left ventricular diastolic parameters were normal.  2. Right ventricular systolic function is normal. The right ventricular size is normal.  3. The mitral valve is normal in structure. No evidence of mitral valve regurgitation. No evidence of mitral stenosis.  4. The aortic valve is tricuspid. There is mild calcification of the aortic valve. Aortic valve regurgitation is not visualized. No aortic stenosis is present.  5. The inferior vena cava is normal in size with greater than 50% respiratory variability, suggesting  right atrial pressure of 3 mmHg.  6. Agitated saline contrast bubble study was negative, with no evidence of any interatrial shunt. FINDINGS  Left Ventricle: Left ventricular ejection fraction, by estimation, is 70 to 75%. The left ventricle has hyperdynamic function. The left ventricle has no regional wall motion abnormalities. The left ventricular internal cavity size was normal in size. There is no left ventricular hypertrophy. Left ventricular diastolic parameters were normal. Right Ventricle: The right ventricular size is normal. No increase in right ventricular wall thickness. Right ventricular systolic function is normal. Left Atrium: Left atrial size was normal in size. Right Atrium: Right atrial size was normal in size. Pericardium: There is no evidence of pericardial effusion. Mitral Valve: The mitral valve is normal in structure. No evidence of mitral valve regurgitation. No evidence of mitral valve stenosis. Tricuspid Valve: The tricuspid valve is normal in structure. Tricuspid valve regurgitation is trivial. No evidence of tricuspid stenosis. Aortic Valve: The aortic valve is tricuspid. There is mild calcification of the aortic valve. Aortic valve regurgitation is not visualized. No aortic stenosis is present. Pulmonic  Valve: The pulmonic valve was normal in structure. Pulmonic valve regurgitation is not visualized. No evidence of pulmonic stenosis. Aorta: The aortic root is normal in size and structure. Venous: The inferior vena cava is normal in size with greater than 50% respiratory variability, suggesting right atrial pressure of 3 mmHg. IAS/Shunts: No atrial level shunt detected by color flow Doppler. Agitated saline contrast was given intravenously to evaluate for intracardiac shunting. Agitated saline contrast bubble study was negative, with no evidence of any interatrial shunt.  LEFT VENTRICLE PLAX 2D LVIDd:         5.20 cm   Diastology LVIDs:         3.00 cm   LV e' medial:    8.05 cm/s LV PW:         0.90 cm   LV E/e' medial:  11.9 LV IVS:        1.00 cm   LV e' lateral:   15.25 cm/s LVOT diam:     2.20 cm   LV E/e' lateral: 6.3 LVOT Area:     3.80 cm  RIGHT VENTRICLE             IVC RV Basal diam:  3.10 cm     IVC diam: 1.50 cm RV S prime:     21.00 cm/s TAPSE (M-mode): 2.6 cm LEFT ATRIUM             Index        RIGHT ATRIUM           Index LA diam:        3.50 cm 1.41 cm/m   RA Area:     17.90 cm LA Vol (A2C):   74.7 ml 30.18 ml/m  RA Volume:   48.80 ml  19.72 ml/m LA Vol (A4C):   80.5 ml 32.53 ml/m LA Biplane Vol: 79.6 ml 32.16 ml/m   AORTA Ao Root diam: 3.20 cm MITRAL VALVE MV Area (PHT): 2.99 cm     SHUNTS MV Decel Time: 254 msec     Systemic Diam: 2.20 cm MV E velocity: 95.90 cm/s MV A velocity: 115.00 cm/s MV E/A ratio:  0.83 Arvilla Meres MD Electronically signed by Arvilla Meres MD Signature Date/Time: 07/06/2023/7:06:55 PM    Final    DG CHEST PORT 1 VIEW Result Date: 07/04/2023 CLINICAL DATA:  Decompensated cirrhosis. EXAM: PORTABLE CHEST 1 VIEW COMPARISON:  Radiograph  yesterday FINDINGS: Stable heart size and mediastinal contours. Improvement in interstitial opacities. Small left pleural effusion on CT earlier today is not well seen by radiograph. No focal airspace disease. No pneumothorax.  IMPRESSION: Improvement in interstitial opacities from yesterday. Small left pleural effusion on CT earlier today is not well seen by radiograph Electronically Signed   By: Narda Rutherford M.D.   On: 07/04/2023 23:03   CT VENOGRAM ABDOMEN PELVIS Result Date: 07/04/2023 CLINICAL DATA:  Bowel obstruction suspected Question occlusion of TIPS EXAM: CT VENOGRAM ABDOMEN AND PELVIS TECHNIQUE: Venographic phase images of the abdomen and pelvis were obtained following the administration of intravenous contrast. Multiplanar reformats and maximum intensity projections were generated. RADIATION DOSE REDUCTION: This exam was performed according to the departmental dose-optimization program which includes automated exposure control, adjustment of the mA and/or kV according to patient size and/or use of iterative reconstruction technique. CONTRAST:  OMNIPAQUE IOHEXOL 350 MG/ML SOLN COMPARISON:  Hepatic Doppler earlier today demonstrating occluded TIPS. CT 05/19/2023 FINDINGS: Veins: Majority of the TIPS demonstrates no contrast opacification consistent with occlusion. There may be minimal contrast within the hepatic and portal venous aspects, however the majority of the stent is occluded. The extrahepatic portal vein appears patent. The splenic vein is patent. The renal veins are patent. The IVC and iliac veins are patent. No filling defects in the SMV or IMV. Lower chest: Small left and trace right pleural effusion. Hepatobiliary: Cirrhotic hepatic morphology. No evidence of focal liver lesion. Clips in the gallbladder fossa postcholecystectomy. No biliary dilatation. Pancreas: No pancreatic ductal dilatation. No evidence of pancreatic mass. There is generalized edema of the mesenteric fat which is not disproportionately affecting the pancreas. Spleen: No splenomegaly.  Greatest splenic length is 12.4 cm. Adrenals/Urinary Tract: No adrenal nodule. No hydronephrosis or renal calculi. Low-density lesions in the left  kidney are too small to characterize. No further follow-up imaging is recommended. Foley catheter decompresses the urinary bladder. Stomach/Bowel: Detailed bowel assessment is limited in the presence of ascites and lack contrast. There are paraesophageal and perigastric varices. Ingested material distends the stomach. No small bowel distention or evidence of obstruction. Normal appendix visualized. Wall thickening of the ascending and proximal transverse colon may be due to portal colopathy. Mild gaseous distension of transverse colon. Sigmoid colon is redundant. Small volume of formed colonic stool. Vascular/Lymphatic: Aortic and branch atherosclerosis. No aneurysm. There are prominent portal caval nodes are likely reactive. Reproductive: Prostate is unremarkable. Other: Moderate volume abdominopelvic ascites. Ascites tracks into an umbilical hernia. Generalized edema of the intra-abdominal and subcutaneous fat. No free intra-abdominal air. Musculoskeletal: The bones are diffusely under mineralized. Scattered Schmorl's nodes in the spine. There are no acute or suspicious osseous abnormalities. IMPRESSION: 1. CT confirms occluded TIPS. 2. Cirrhosis with moderate ascites.  Paraesophageal varices. 3. Wall thickening of the cecum and ascending colon may represent portal colopathy. 4. No bowel obstruction. Aortic Atherosclerosis (ICD10-I70.0). Electronically Signed   By: Narda Rutherford M.D.   On: 07/04/2023 19:33   IR Paracentesis Result Date: 07/04/2023 INDICATION: Patient with a history of cirrhosis with recurrent ascites. Interventional radiology asked to perform a therapeutic paracentesis. EXAM: ULTRASOUND GUIDED PARACENTESIS MEDICATIONS: 1% lidocaine 20 mL COMPLICATIONS: None immediate. PROCEDURE: Informed written consent was obtained from the patient after a discussion of the risks, benefits and alternatives to treatment. A timeout was performed prior to the initiation of the procedure. Initial ultrasound  scanning demonstrates a large amount of ascites within the right lower abdominal quadrant. The right lower abdomen was  prepped and draped in the usual sterile fashion. 1% lidocaine was used for local anesthesia. Following this, a 19 gauge, 10-cm, Yueh catheter was introduced. An ultrasound image was saved for documentation purposes. The paracentesis was performed. The catheter was removed and a dressing was applied. The patient tolerated the procedure well without immediate post procedural complication. Patient received post-procedure intravenous albumin; see nursing notes for details. FINDINGS: A total of approximately 6.1 L of clear yellow fluid was removed. IMPRESSION: Successful ultrasound-guided paracentesis yielding 6.1 liters of peritoneal fluid. Procedure performed by Alwyn Ren NP PLAN: The patient is enrolled in the [facility]iology [facility] and is being actively followed. He is status post TIPS on 07/20/2022 with Dr. Malachi Pro, MD [facility] Radiology Electronically Signed   By: Roanna Banning M.D.   On: 07/04/2023 13:52   US ABDOMEN LIMITED WITH LIVER DOPPLER Result Date: 07/04/2023 CLINICAL DATA:  Decompensated cirrhosis, post TIPS creation 07/20/2022 EXAM: DUPLEX ULTRASOUND OF LIVER TECHNIQUE: Color and duplex Doppler ultrasound was performed to evaluate the hepatic in-flow and out-flow vessels. COMPARISON:  None Available. FINDINGS: Liver: Small liver with nodular contour. No focal lesion, mass or intrahepatic biliary ductal dilatation. Main Portal Vein size: 0.6 cm cm Portal Vein Velocities (hepatopetal): Main Prox:  17 cm/sec Main Mid: 22 cm/sec Main Dist:  19 cm/sec Right: Limited visualization, flow signal in the proximal aspect TIPS stent is identified, with no internal flow or color Doppler signal in its mid and hepatic vein components. Hepatic Vein Velocities (hepatofugal): Right:  27 cm/sec  Middle:  28 cm/sec Left:  23 cm/sec IVC: Present and patent with normal respiratory phasicity. Velocity 24 cm/sec. Hepatic Artery Velocity:  89 cm/sec Splenic Vein Velocity:  22 cm/sec Spleen: 11.7 cm x 10.9 cm x 3.8 cm with a total volume of 252 cm^3 (411 cm^3 is upper limit normal) Portal Vein Occlusion/Thrombus: No Splenic Vein Occlusion/Thrombus: No Ascites: Small to moderate Varices: None IMPRESSION: 1. Occluded TIPS stent with no internal flow or color Doppler signal in its mid and hepatic vein components. 2. Small liver with nodular contour. 3. Small to moderate ascites. Electronically Signed   By: Corlis Leak M.D.   On: 07/04/2023 13:11   DG Chest Portable 1 View Result Date: 07/03/2023 CLINICAL DATA:  Dyspnea EXAM: PORTABLE CHEST 1 VIEW COMPARISON:  06/08/2023 FINDINGS: The heart size and mediastinal contours are within normal limits. Slightly increased interstitial markings bilaterally. Band-like atelectasis at the right lung base. No pleural effusion or pneumothorax. The visualized skeletal structures are unremarkable. IMPRESSION: Slightly increased interstitial markings bilaterally, which may reflect bronchitic lung changes versus mild edema or developing atypical/viral infection. Electronically Signed   By: Duanne Guess D.O.   On: 07/03/2023 18:07   IR Paracentesis Result Date: 06/19/2023 INDICATION: 53 year old male with recurrent ascites. Request made for therapeutic paracentesis. EXAM: ULTRASOUND GUIDED THERAPEUTIC PARACENTESIS MEDICATIONS: 10 mL 1% lidocaine COMPLICATIONS: None immediate. PROCEDURE: Informed written consent was obtained from the patient after a discussion of the risks, benefits and alternatives to treatment. A timeout was performed prior to the initiation of the procedure. Initial ultrasound scanning demonstrates a large amount of ascites within the left lateral abdominal quadrant. The left lateral abdomen was prepped and draped in the usual sterile fashion. 1% lidocaine  was used for local anesthesia. Following this, a 19 gauge, 7-cm, Yueh catheter was introduced. An ultrasound image was saved for documentation purposes. The paracentesis was performed. The catheter was removed and a  dressing was applied. The patient tolerated the procedure well without immediate post procedural complication. Patient received post-procedure intravenous albumin; see nursing notes for details. FINDINGS: A total of approximately 7.1 of yellow fluid was removed. IMPRESSION: Successful ultrasound-guided paracentesis yielding 7.1 liters of peritoneal fluid. Performed by: Loyce Dys PA-C Electronically Signed   By: Irish Lack M.D.   On: 06/19/2023 16:16     Discharge Exam: Vitals:   07/08/23 0704 07/08/23 1523  BP: 114/68 121/79  Pulse: (!) 102 89  Resp: 18   Temp: 98 F (36.7 C) 98 F (36.7 C)  SpO2: 100% 100%   Vitals:   07/07/23 2100 07/08/23 0421 07/08/23 0704 07/08/23 1523  BP: 129/72 127/73 114/68 121/79  Pulse: 91 98 (!) 102 89  Resp: 20 20 18    Temp: (!) 97.5 F (36.4 C) 97.8 F (36.6 C) 98 F (36.7 C) 98 F (36.7 C)  TempSrc: Oral Oral Oral Oral  SpO2: 100% 100% 100% 100%  Weight:      Height:        General: Pt is alert, awake, not in acute distress Cardiovascular: RRR, S1/S2 +, no rubs, no gallops Respiratory: CTA bilaterally, no wheezing, no rhonchi on [place] Abdominal: Soft, NT, distended, bowel sounds + Extremities: no edema, no cyanosis    The results of significant diagnostics from this hospitalization (including imaging, microbiology, ancillary and laboratory) are listed below for reference.     Microbiology: Recent Results (from the past 240 hours)  Body fluid culture w Gram Stain     Status: None   Collection Time: 07/03/23  4:39 PM   Specimen: Peritoneal Cavity; Peritoneal Fluid  Result Value Ref Range Status   Specimen Description PERITONEAL CAVITY  Final   Special Requests NONE  Final   Gram Stain NO WBC SEEN NO ORGANISMS SEEN    Final   Culture   Final    NO GROWTH 3 DAYS Performed at [facility], [address], [place], [place] [address]    Report Status 07/07/2023 FINAL  Final     Labs: BNP (last 3 results) Recent Labs    07/06/23 1541  BNP 134.4*   Basic Metabolic Panel: Recent Labs  Lab 07/05/23 0356 07/05/23 0954 07/06/23 0353 07/07/23 0743 07/08/23 0944  NA 122* 122* 122* 125* 124*  K 4.9 5.0 5.4* 4.9 4.5  CL 93* 93* 92* 96* 95*  CO2 19* 19* 20* 21* 21*  GLUCOSE 96 93 89 81 121*  BUN 19 22* 25* 26* 26*  CREATININE 2.15* 2.32* 2.29* 1.90* 1.59*  CALCIUM 8.9 8.8* 8.9 8.6* 8.7*   Liver Function Tests: Recent Labs  Lab 07/03/23 1354 07/04/23 0551 07/05/23 0954 07/06/23 0353  AST 75* 66* 63* 47*  ALT 50* 48* 46* 38  ALKPHOS 125 108 115 96  BILITOT 3.6* 3.6* 2.2* 2.7*  PROT 6.0* 5.8* 5.8* 5.9*  ALBUMIN 2.9* 3.0* 2.9* 3.7   No results for input(s): "LIPASE", "AMYLASE" in the last 168 hours. Recent Labs  Lab 07/03/23 1354 07/06/23 1541 07/07/23 0743  AMMONIA 23 33 32   CBC: Recent Labs  Lab 07/03/23 1354 07/04/23 0551 07/05/23 0356 07/06/23 0353 07/07/23 0743  WBC 6.6 6.1 8.3 7.0 6.4  NEUTROABS 5.2  --  6.5  --  4.5  HGB 9.8* 9.1* 9.5* 8.4* 8.3*  HCT 28.1* 25.2* 26.9* 24.3* 24.0*  MCV 89.2 88.4 88.8 90.0 92.0  PLT 123* 100* 107* 88* 96*   Cardiac Enzymes: No results for input(s): "CKTOTAL", "CKMB", "  CKMBINDEX", "TROPONINI" in the last 168 hours. BNP: Invalid input(s): "POCBNP" CBG: No results for input(s): "GLUCAP" in the last 168 hours. D-Dimer No results for input(s): "DDIMER" in the last 72 hours. Hgb A1c No results for input(s): "HGBA1C" in the last 72 hours. Lipid Profile No results for input(s): "CHOL", "HDL", "LDLCALC", "TRIG", "CHOLHDL", "LDLDIRECT" in the last 72 hours. Thyroid function studies No results for input(s): "TSH", "T4TOTAL", "T3FREE", "THYROIDAB" in the last 72 hours.  Invalid input(s): "FREET3" Anemia work up No results for  input(s): "VITAMINB12", "FOLATE", "FERRITIN", "TIBC", "IRON", "RETICCTPCT" in the last 72 hours. Urinalysis    Component Value Date/Time   COLORURINE YELLOW 07/04/2023 2205   APPEARANCEUR HAZY (A) 07/04/2023 2205   LABSPEC 1.025 07/04/2023 2205   PHURINE 5.0 07/04/2023 2205   GLUCOSEU NEGATIVE 07/04/2023 2205   HGBUR LARGE (A) 07/04/2023 2205   BILIRUBINUR NEGATIVE 07/04/2023 2205   KETONESUR NEGATIVE 07/04/2023 2205   PROTEINUR NEGATIVE 07/04/2023 2205   NITRITE NEGATIVE 07/04/2023 2205   LEUKOCYTESUR LARGE (A) 07/04/2023 2205   Sepsis Labs Recent Labs  Lab 07/04/23 0551 07/05/23 0356 07/06/23 0353 07/07/23 0743  WBC 6.1 8.3 7.0 6.4   Microbiology Recent Results (from the past 240 hours)  Body fluid culture w Gram Stain     Status: None   Collection Time: 07/03/23  4:39 PM   Specimen: Peritoneal Cavity; Peritoneal Fluid  Result Value Ref Range Status   Specimen Description PERITONEAL CAVITY  Final   Special Requests NONE  Final   Gram Stain NO WBC SEEN NO ORGANISMS SEEN   Final   Culture   Final    NO GROWTH 3 DAYS Performed at [facility], [address], [place], [place] [address]    Report Status 07/07/2023 FINAL  Final       SIGNED:   Kathlen Mody,  [facility] 07/08/2023, 4:26 PM  If 7PM-7AM, please contact night-coverage [email or web address]  Addendum:   Pt seen and examined, no new complaints. Currently awaiting for transfer to [facility].   General exam: Appears calm and comfortable  Respiratory system: Clear to auscultation. Respiratory effort normal. Cardiovascular system: S1 & S2 heard, RRR Gastrointestinal system: Abdomen is Soft, distended, bs+ Central nervous system: Alert and oriented. Extremities: Symmetric 5 x 5 power. Skin: No rashes, Psychiatry: Mood & affect appropriate.   Monitor.    Kathlen Mody MD

## 2023-07-09 DIAGNOSIS — K7290 Hepatic failure, unspecified without coma: Secondary | ICD-10-CM | POA: Diagnosis not present

## 2023-07-09 DIAGNOSIS — E871 Hypo-osmolality and hyponatremia: Secondary | ICD-10-CM | POA: Diagnosis not present

## 2023-07-09 DIAGNOSIS — K7460 Unspecified cirrhosis of liver: Secondary | ICD-10-CM | POA: Diagnosis not present

## 2023-07-09 DIAGNOSIS — N179 Acute kidney failure, unspecified: Secondary | ICD-10-CM | POA: Diagnosis not present

## 2023-07-09 LAB — RENAL FUNCTION PANEL
Albumin: 3 g/dL — ABNORMAL LOW (ref 3.5–5.0)
Anion gap: 5 (ref 5–15)
BUN: 27 mg/dL — ABNORMAL HIGH (ref 6–20)
CO2: 26 mmol/L (ref 22–32)
Calcium: 8.7 mg/dL — ABNORMAL LOW (ref 8.9–10.3)
Chloride: 97 mmol/L — ABNORMAL LOW (ref 98–111)
Creatinine, Ser: 1.4 mg/dL — ABNORMAL HIGH (ref 0.61–1.24)
GFR, Estimated: >60 mL/min (ref >60)
Glucose, Bld: 101 mg/dL — ABNORMAL HIGH (ref 70–99)
Phosphorus: 3.4 mg/dL (ref 2.5–4.6)
Potassium: 4 mmol/L (ref 3.5–5.1)
Sodium: 128 mmol/L — ABNORMAL LOW (ref 135–145)

## 2023-07-09 MED ORDER — LACTULOSE 10 GM/15ML PO SOLN
30.0000 g | Freq: Two times a day (BID) | ORAL | Status: DC
Start: 2023-07-09 — End: 2023-07-10
  Administered 2023-07-09 – 2023-07-10 (×2): 30 g via ORAL
  Filled 2023-07-09 (×2): qty 45

## 2023-07-09 MED ORDER — FENTANYL CITRATE PF 50 MCG/ML IJ SOSY
12.5000 ug | PREFILLED_SYRINGE | Freq: Once | INTRAMUSCULAR | Status: AC | PRN
  Administered 2023-07-10: 12.5 ug via INTRAVENOUS
  Filled 2023-07-09: qty 1

## 2023-07-09 MED ORDER — NALOXONE HCL 0.4 MG/ML IJ SOLN: 0.4000 mg | INTRAMUSCULAR | Status: DC | PRN

## 2023-07-09 MED ORDER — FUROSEMIDE 10 MG/ML IJ SOLN: 40.0000 mg | Freq: Two times a day (BID) | INTRAMUSCULAR | Status: DC

## 2023-07-09 NOTE — Plan of Care (Signed)

## 2023-07-09 NOTE — Evaluation (Addendum)
Occupational Therapy Evaluation Patient Details Name: Erik Howe MRN: [id number] DOB: 14-Mar-1971 Today's Date: 07/09/2023   History of Present Illness 53 y/o M presenting to ED on 1/13 with worsenign SOB and abdominal distention, has had multiple paracentesis since TIPS procedure (06/2022). Admitted for decompnesated liver cirrhosis complicated by refractory ascites along with AKI due to hepatorenal syndrome, suspected to be related to TIPS occlusion. Unable to perform TIPS revision due to elevated R heart pressures. ECHo does not show this and no further workup is needed. [facility] transplant team notified of pt's hospitalization and requested transfer to their facility for further care.     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 Pt currently needing up to mod A for ADL, CGA for bed mobility, and CGA for transfers with RW. Pt able to walk short distance in room before feeling fatigued. Pt presenting with impairments listed below, will follow acutely. Defer d/c recommendations to transfer venue to reassess after further workup, however at this time would benefit from postacute rehab at d/c due to decr support at home.      If plan is discharge home, recommend the following: A little help with walking and/or transfers;A lot of help with bathing/dressing/bathroom;Assistance with cooking/housework;Assist for transportation;Help with stairs or ramp for entrance    Functional Status Assessment  Patient has had a recent decline in their functional status and demonstrates the ability to make significant improvements in function in a reasonable and predictable amount of time.  Equipment Recommendations   Tub/shower bench;Other (comment) (RW)    Recommendations for Other Services PT consult     Precautions / Restrictions Precautions Precautions: Fall Restrictions Weight Bearing Restrictions Per Provider Order: No      Mobility Bed Mobility Overal bed mobility: Needs Assistance Bed Mobility: Supine to Sit     Supine to sit: Contact guard     General bed mobility comments: incr time    Transfers Overall transfer level: Needs assistance Equipment used: Rolling walker (2 wheels) Transfers: Sit to/from Stand Sit to Stand: Contact guard assist                  Balance Overall balance assessment: Needs assistance Sitting-balance support: Feet supported Sitting balance-Leahy Scale: Good Sitting balance - Comments: seated EOB without support   Standing balance support: Bilateral upper extremity supported, During functional activity, Reliant on assistive device for balance Standing balance-Leahy Scale: Poor Standing balance comment: RW in standing                           ADL either performed or assessed with clinical judgement   ADL Overall ADL's : Needs assistance/impaired Eating/Feeding: Set up;Sitting   Grooming: Contact guard assist;Standing   Upper Body Bathing: Minimal assistance;Sitting   Lower Body Bathing: Moderate assistance   Upper Body Dressing : Minimal assistance   Lower Body Dressing: Moderate assistance   Toilet Transfer: Contact guard assist;Ambulation;Regular Toilet;Rolling walker (2 wheels)   Toileting- Clothing Manipulation and Hygiene: Contact guard assist       Functional mobility during ADLs: Contact guard assist;Rolling walker (2 wheels)       Vision   Vision Assessment?: No apparent visual deficits     Perception Perception: Not tested  Praxis Praxis: Not tested       Pertinent Vitals/Pain Pain Assessment Pain Assessment: No/denies pain     Extremity/Trunk Assessment Upper Extremity  Assessment Upper Extremity Assessment: Overall WFL for tasks assessed   Lower Extremity Assessment Lower Extremity Assessment: Defer to PT evaluation       Communication Communication Communication: No apparent difficulties   Cognition Arousal: Alert Behavior During Therapy: WFL for tasks assessed/performed, Anxious Overall Cognitive Status: Within Functional Limits for tasks assessed                                 General Comments: mild anxiety with mobility, states he is ready to "get this over with", benefits from encouragement     General Comments  VSS on RA    Exercises     Shoulder Instructions      Home Living Family/patient expects to be discharged to:: Private residence Living Arrangements: Alone Available Help at Discharge: Friend(s) (sister calls 3x/day, friend lives in [place]) Type of Home: House Home Access: Stairs to enter Secretary/administrator of Steps: 1   Home Layout: One level     Bathroom Shower/Tub: Chief Strategy Officer: Standard     Home Equipment: BSC/3in1          Prior Functioning/Environment Prior Level of Function : Independent/Modified Independent;Driving             Mobility Comments: ind ADLs Comments: has had incr difficulty since thanksgiving        OT Problem List: Decreased strength;Decreased range of motion;Decreased activity tolerance;Impaired balance (sitting and/or standing);Cardiopulmonary status limiting activity;Decreased safety awareness      OT Treatment/Interventions: Self-care/ADL training;Therapeutic exercise;Energy conservation;DME and/or AE instruction;Therapeutic activities;Balance training;Patient/family education    OT Goals(Current goals can be found in the care plan section) Acute Rehab OT Goals Patient Stated Goal: none stated OT Goal Formulation: With patient Time For Goal Achievement: 07/23/23 Potential to Achieve Goals: Good ADL Goals Pt Will Perform  Upper Body Dressing: with supervision;sitting Pt Will Perform Lower Body Dressing: with contact guard assist;sitting/lateral leans;sit to/from stand Pt Will Transfer to Toilet: with supervision;ambulating;regular height toilet Pt Will Perform Tub/Shower Transfer: Tub transfer;Shower transfer;with supervision;ambulating  OT Frequency: Min 1X/week    Co-evaluation              AM-PAC OT "6 Clicks" Daily Activity     Outcome Measure Help from another person eating meals?: None Help from another person taking care of personal grooming?: A Little Help from another person toileting, which includes using toliet, bedpan, or urinal?: A Lot Help from another person bathing (including washing, rinsing, drying)?: A Lot Help from another person to put on and taking off regular upper body clothing?: A Little Help from another person to put on and taking off regular lower body clothing?: A Lot 6 Click Score: 16   End of Session Equipment Utilized During Treatment: Gait belt;Rolling walker (2 wheels) Nurse Communication: Mobility status  Activity Tolerance: Patient limited by fatigue Patient left: in chair;with call bell/phone within reach;with chair alarm set  OT Visit Diagnosis: Unsteadiness on feet (R26.81);Other abnormalities of gait and mobility (R26.89);Muscle weakness (generalized) (M62.81)                Time: 4010-2725 OT Time Calculation (min): 21 min Charges:  OT General Charges $OT Visit: 1 Visit OT Evaluation $OT Eval Moderate Complexity: 1 Mod  Baer Hinton K, OTD, OTR/L SecureChat Preferred  Acute Rehab [phone]   Dalphine Handing 07/09/2023, 12:14 PM

## 2023-07-09 NOTE — Progress Notes (Addendum)
Daily Progress Note  DOA: 07/03/2023 Hospital Day: 7   Chief Complaint:  Decompensated cirrhosis   ASSESSMENT    Brief Narrative:  Erik Howe is a 53 y.o. year old male with a history of  Etoh abuse, decompensated cirrhosis followed by [facility], chronic loose stool, cholecystectomy, multiple polyps including an advanced polyp(s), colon AMV, NSAID induced duodenal ulcers, severe esophagitis, hiatal hernia . Admitted with decompensated cirrhosis and weakness. Found to have occluded TIPS   Decompensated MASH cirrhosis s/p TIPS.   Admitted with abdominal pain / SHOB , large volume ascites, TIPS occlusion. MELD 3.0: 29 at 07/07/2023  7:43 AM Thrombectomy and TIPS revision wasn't able to be done as right atrial pressures were discordant with echocardiogram from 07/08/22. Cardiology has evaluated. Repeat Echo showing normal diastolic function and the RA pressure is low. Right heart cath felt not to be needed.  AKI , secondary to intravascular volume depletion post TIPS x 2, diuretics and HRS.   Ur sodium < 10 . Nephrology following. Cr improved on HRS treatment.  Now diuresing with IV Lasix  Abdominal distention.  KUB showed ileus.  Narcotics have been stopped.  Patient is continue to pass stools.  Principal Problem:   Decompensated cirrhosis (HCC) Active Problems:   Hyponatremia   AKI (acute kidney injury) (HCC)   PLAN   --Continue midodrine and octreotide.  Holding on albumin now --Continue IV diuresis -- Plan for paracentesis tmrw for patient comfort --Nephrology following --Low-sodium diet <2 grams per day --Continue lactulose and rifaximin for treatment of hepatic encephalopathy --Avoiding opioids in the setting of ileus --Previously discussed with [facility] transplant hepatology and they would favor holding off on TIPS revision for now since patient will likely get a transfer in the near future --Awaiting transfer to [facility] in [place]  Subjective   Still awaiting  transfer to [facility].  Patient passed several stools today.  Abdomen continues to feel distended. He is feeling uncomfortable currently.   Objective    Recent Labs    07/07/23 0743  WBC 6.4  HGB 8.3*  HCT 24.0*  PLT 96*   BMET Recent Labs    07/07/23 0743 07/08/23 0944 07/09/23 0731  NA 125* 124* 128*  K 4.9 4.5 4.0  CL 96* 95* 97*  CO2 21* 21* 26  GLUCOSE 81 121* 101*  BUN 26* 26* 27*  CREATININE 1.90* 1.59* 1.40*  CALCIUM 8.6* 8.7* 8.7*   LFT Recent Labs    07/09/23 0731  ALBUMIN 3.0*   PT/INR No results for input(s): "LABPROT", "INR" in the last 72 hours.    Imaging:  DG Abd 1 View CLINICAL DATA:  96789 Abdominal distention [id number]  EXAM: ABDOMEN - 1 VIEW  COMPARISON:  07/04/2023  FINDINGS: Diffuse gaseous distension of the colon. No abnormally dilated loops of small bowel are evident. No free intraperitoneal identified. No radio-opaque calculi or other significant radiographic abnormality are seen.  IMPRESSION: Diffuse gaseous distension of the colon, suggestive of ileus.  Electronically Signed   By: Duanne Guess D.O.   On: 07/07/2023 19:00     Scheduled inpatient medications:   amitriptyline  10 mg Oral QHS   Chlorhexidine Gluconate Cloth  6 each Topical Daily   feeding supplement  237 mL Oral BID BM   furosemide  40 mg Intravenous Q12H   lactulose  30 g Oral BID   midodrine  10 mg Oral TID WC   pantoprazole  40 mg Oral BID   rifaximin  550  mg Oral BID   Continuous inpatient infusions:   octreotide (SANDOSTATIN) 500 mcg in sodium chloride 0.9 % 250 mL (2 mcg/mL) infusion 50 mcg/hr (07/09/23 1837)   PRN inpatient medications: acetaminophen **OR** acetaminophen, iohexol, ondansetron **OR** ondansetron (ZOFRAN) IV, mouth rinse  Vital signs in last 24 hours: Temp:  [97.8 F (36.6 C)-98.1 F (36.7 C)] 98 F (36.7 C) (01/19 1524) Pulse Rate:  [90] 90 (01/19 1524) Resp:  [19] 19 (01/19 0601) BP: (99-125)/(59-72) 122/72 (01/19  1524) SpO2:  [100 %] 100 % (01/19 1524) Last BM Date : 07/09/23  Intake/Output Summary (Last 24 hours) at 07/09/2023 1947 Last data filed at 07/09/2023 1500 Gross per 24 hour  Intake 1198.05 ml  Output 1725 ml  Net -526.95 ml    Intake/Output from previous day: 01/18 0701 - 01/19 0700 In: 1298.1 [P.O.:700; I.V.:598.1] Out: 2525 [Urine:2525] Intake/Output this shift: No intake/output data recorded.   Physical Exam:  General: Alert male in NAD Heart:  Regular rate and rhythm.  Pulmonary: Normal respiratory effort Abdomen: Soft, distended, tympanic, non-tender Extremities: No significant lower extremity edema  Neurologic: Alert and oriented Psych: Pleasant. Cooperative.     LOS: 5 days   Imogene Burn ,MD 07/09/2023, 7:47 PM

## 2023-07-09 NOTE — Progress Notes (Signed)
[facility] PROGRESS NOTE  Assessment/ Plan: Pt is a 53 y.o. yo male  with past medical history significant for hypertension, anxiety depression, alcohol abuse with decompensated liver cirrhosis status post TIPS now undergoing liver transplant evaluation at [facility], cholecystectomy, presented with abdominal pain and worsening ascites, seen as a consultation for further evaluation and management of acute kidney injury.   # Acute kidney injury likely hepatorenal syndrome and hemodynamic changes due to recent large-volume paracentesis x2. Urine Na<10. Foley catheter for urinary retension in ER.   UA with no protein, RBCs probably because of the catheter. Has improved with HRS treatment.  Using IV Lasix for volume excess.  Waiting for labs to return today but if they are stable or improved we will likely sign off.  Could stop octreotide once creatinine is back to baseline and then wean off midodrine as able   # Decompensated liver cirrhosis with history of hepatic encephalopathy, ascites requiring frequent paracentesis, status post TIPS procedure which is currently occluded.  IR is following for occluded TIPS.  GI is following and managing.  Hopefully can get revision.  Awaiting transfer to [facility]   # Hyponatremia related with liver cirrhosis/hypervolemic.  Continue fluid restriction.  Improving with diuretics.  Follow-up labs today   # Hypotension: Continue midodrine  # Hyperkalemia: Improved with diuretics  Subjective: Patient continues to feel well with no complaints.  Good urine output.  Labs have not returned.  Still awaiting transfer  Objective Vital signs in last 24 hours: Vitals:   07/08/23 1523 07/08/23 1935 07/09/23 0601 07/09/23 0744  BP: 121/79 117/71 (!) 99/59 125/63  Pulse: 89 91 90   Resp:  15 19   Temp: 98 F (36.7 C) 98.3 F (36.8 C) 98.1 F (36.7 C) 97.8 F (36.6 C)  TempSrc: Oral Oral Oral Oral  SpO2: 100% 100% 100% 100%  Weight:       Height:       Weight change:   Intake/Output Summary (Last 24 hours) at 07/09/2023 0814 Last data filed at 07/09/2023 0742 Gross per 24 hour  Intake 1538.05 ml  Output 2525 ml  Net -986.95 ml       Labs: RENAL PANEL Recent Labs  Lab 07/03/23 1354 07/04/23 0551 07/04/23 1701 07/05/23 0356 07/05/23 0954 07/06/23 0353 07/07/23 0743 07/08/23 0944  NA 120* 121*   < > 122* 122* 122* 125* 124*  K 4.6 4.7   < > 4.9 5.0 5.4* 4.9 4.5  CL 90* 92*   < > 93* 93* 92* 96* 95*  CO2 19* 20*   < > 19* 19* 20* 21* 21*  GLUCOSE 104* 98   < > 96 93 89 81 121*  BUN 17 19   < > 19 22* 25* 26* 26*  CREATININE 1.79* 1.92*   < > 2.15* 2.32* 2.29* 1.90* 1.59*  CALCIUM 8.7* 9.0   < > 8.9 8.8* 8.9 8.6* 8.7*  ALBUMIN 2.9* 3.0*  --   --  2.9* 3.7  --   --    < > = values in this interval not displayed.    Liver Function Tests: Recent Labs  Lab 07/04/23 0551 07/05/23 0954 07/06/23 0353  AST 66* 63* 47*  ALT 48* 46* 38  ALKPHOS 108 115 96  BILITOT 3.6* 2.2* 2.7*  PROT 5.8* 5.8* 5.9*  ALBUMIN 3.0* 2.9* 3.7   No results for input(s): "LIPASE", "AMYLASE" in the last 168 hours. Recent Labs  Lab 07/03/23 1354 07/06/23 1541 07/07/23 1610  AMMONIA 23 33 32   CBC: Recent Labs    09/22/22 1034 10/19/22 1213 07/03/23 1354 07/04/23 0551 07/05/23 0356 07/06/23 0353 07/07/23 0743  HGB  --    < > 9.8* 9.1* 9.5* 8.4* 8.3*  MCV  --    < > 89.2 88.4 88.8 90.0 92.0  VITAMINB12 544  --   --   --   --   --   --   FOLATE 10.9  --   --   --   --   --   --   FERRITIN 754.8*  --   --   --   --   --   --   TIBC 184.8*  --   --   --   --   --   --   IRON 180*  --   --   --   --   --   --    < > = values in this interval not displayed.    Cardiac Enzymes: No results for input(s): "CKTOTAL", "CKMB", "CKMBINDEX", "TROPONINI" in the last 168 hours. CBG: No results for input(s): "GLUCAP" in the last 168 hours.  Iron Studies: No results for input(s): "IRON", "TIBC", "TRANSFERRIN", "FERRITIN" in  the last 72 hours. Studies/Results: DG Abd 1 View Result Date: 07/07/2023 CLINICAL DATA:  [id number] Abdominal distention [id number] EXAM: ABDOMEN - 1 VIEW COMPARISON:  07/04/2023 FINDINGS: Diffuse gaseous distension of the colon. No abnormally dilated loops of small bowel are evident. No free intraperitoneal identified. No radio-opaque calculi or other significant radiographic abnormality are seen. IMPRESSION: Diffuse gaseous distension of the colon, suggestive of ileus. Electronically Signed   By: Duanne Guess D.O.   On: 07/07/2023 19:00    Medications: Infusions:  octreotide (SANDOSTATIN) 500 mcg in sodium chloride 0.9 % 250 mL (2 mcg/mL) infusion 50 mcg/hr (07/09/23 0750)    Scheduled Medications:  amitriptyline  10 mg Oral QHS   Chlorhexidine Gluconate Cloth  6 each Topical Daily   feeding supplement  237 mL Oral BID BM   furosemide  40 mg Intravenous Q12H   lactulose  30 g Oral QID   midodrine  10 mg Oral TID WC   pantoprazole  40 mg Oral BID   rifaximin  550 mg Oral BID    have reviewed scheduled and prn medications.  Physical Exam: General: Lying in bed, no distress Heart: Normal rate, no rub Lungs: Bilateral chest rise with no increased work of breathing Abdomen: Soft but distended. Extremities: 1+ Bilateral leg peripheral edema present Neurology: Awake and alert Darnell Level 07/09/2023,8:14 AM  LOS: 5 days

## 2023-07-09 NOTE — Progress Notes (Signed)
[facility]                                                                               Erik Howe, is a 53 y.o. male, DOB - 1970-11-13, WGN:[id number] Admit date - 07/03/2023    Outpatient Primary MD for the patient is [name], PA-C  LOS - 5  days    Brief summary   53 yr old man who presented to [facility] on 07/03/2023 with complaints of progressively worsening shortness of breath and abdominal distention. 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 Since that procedure the patient has had multiple paracentesis ( about q2 weeks), and he has been admitted for hepatic encephalopathy. 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 This was suspected to be related to TIPS occlusion, resulting in rapid reaccumulation of fluid. Revision of the TIPS was attempted by IR on 1/16, but right heart pressures were elevated and therefore, Cardiology was asked to evaluate. Fortunately, echo on 1/16 does not show this and no further workup is needed. [facility] transplant team was notified of this patient's hospitalization and is requesting transfer to their facility for further care. He continues to remain on Lasix for diuresis along with albumin. He is now in stable condition for transfer with further recommendations per Hepatology.    Assessment & Plan    Assessment and Plan:   Decompensated cirrhosis complicated by refractory ascites/ MASH Cirrhosis/ TIPS Occlusion -Transfer to [facility] - continue with octreotide, midodrine and rifaximin.  - diuresis with IV lasix.  - GI on board and appreciate recommendations.     AKI Possibly from HRS.   Improving. Creatinine at 1.4 today.     Hepatic Encephalopathy Appears to have  improved.  Continue with lactulose and rifaximin.    Insomnia:  Resume Elavil   GERD-continue Protonix     Thrombocytopenia -  Due to decompensated hepatic cirrhosis.  Monitor.    Anemia of chronic disease Hemoglobin stable around 8.    Hyponatremia From fluid overload.  Improving.     Hyperkalemia  Resolved.      Estimated body mass index is 36.68 kg/m as calculated from the following:   Height as of this encounter: 6\' 1"  (1.854 m).   Weight as of this encounter: 126.1 kg.  Code Status: FULL CODE.  DVT Prophylaxis:  SCDs Start: 07/03/23 2032   Level of Care: Level of care: Telemetry Medical Family Communication: Updated patient's family on the phone.   Disposition Plan:     Remains inpatient appropriate:  awaiting transfer to [facility].   Procedures:  ECHO.   Consultants:   Nephrology GI  Antimicrobials:   Anti-infectives (From admission, onward)    Start     Dose/Rate Route Frequency Ordered Stop   07/07/23 0000  rifaximin (XIFAXAN) 550 MG TABS tablet        550 mg Oral 2 times daily 07/07/23 1515     07/05/23 1500  cefTRIAXone (ROCEPHIN) 2 g in sodium  chloride 0.9 % 100 mL IVPB        2 g 200 mL/hr over 30 Minutes Intravenous To Radiology 07/05/23 1237 07/06/23 1500   07/04/23 0200  rifaximin (XIFAXAN) tablet 550 mg        550 mg Oral 2 times daily 07/04/23 0140          Medications  Scheduled Meds:  amitriptyline  10 mg Oral QHS   Chlorhexidine Gluconate Cloth  6 each Topical Daily   feeding supplement  237 mL Oral BID BM   furosemide  40 mg Intravenous Q12H   lactulose  30 g Oral QID   midodrine  10 mg Oral TID WC   pantoprazole  40 mg Oral BID   rifaximin  550 mg Oral BID   Continuous Infusions:  octreotide (SANDOSTATIN) 500 mcg in sodium chloride 0.9 % 250 mL (2 mcg/mL) infusion 50 mcg/hr (07/09/23 0750)   PRN Meds:.acetaminophen **OR** acetaminophen, iohexol, ondansetron **OR** ondansetron (ZOFRAN) IV, mouth rinse    Subjective:    Erik Howe was seen and examined today.  No new complaints.   Objective:   Vitals:   07/08/23 1523 07/08/23 1935 07/09/23 0601 07/09/23 0744  BP: 121/79 117/71 (!) 99/59 125/63  Pulse: 89 91 90   Resp:  15 19   Temp: 98 F (36.7 C) 98.3 F (36.8 C) 98.1 F (36.7 C) 97.8 F (36.6 C)  TempSrc: Oral Oral Oral Oral  SpO2: 100% 100% 100% 100%  Weight:      Height:        Intake/Output Summary (Last 24 hours) at 07/09/2023 0932 Last data filed at 07/09/2023 0742 Gross per 24 hour  Intake 1418.05 ml  Output 2525 ml  Net -1106.95 ml   Filed Weights   07/04/23 1342 07/04/23 1502  Weight: 118 kg 126.1 kg     Exam General exam: Appears calm and comfortable  Respiratory system: Clear to auscultation. Respiratory effort normal. Cardiovascular system: S1 & S2 heard, RRR.  Gastrointestinal system: Abdomen is nondistended, soft and nontender.  Central nervous system: Alert and oriented. No focal neurological deficits. Extremities: Symmetric 5 x 5 power. Skin: No rashes, Psychiatry:  Mood & affect appropriate.     Data Reviewed:  I have personally reviewed following labs and imaging studies   CBC Lab Results  Component Value Date   WBC 6.4 07/07/2023   RBC 2.61 (L) 07/07/2023   HGB 8.3 (L) 07/07/2023   HCT 24.0 (L) 07/07/2023   MCV 92.0 07/07/2023   MCH 31.8 07/07/2023   PLT 96 (L) 07/07/2023   MCHC 34.6 07/07/2023   RDW 15.6 (H) 07/07/2023   LYMPHSABS 0.8 07/07/2023   MONOABS 0.9 07/07/2023   EOSABS 0.1 07/07/2023   BASOSABS 0.0 07/07/2023     Last metabolic panel Lab Results  Component Value Date   NA 128 (L) 07/09/2023   K 4.0 07/09/2023   CL 97 (L) 07/09/2023   CO2 26 07/09/2023   BUN 27 (H) 07/09/2023   CREATININE 1.40 (H) 07/09/2023   GLUCOSE 101 (H) 07/09/2023   GFRNONAA >60 07/09/2023   GFRAA >60 02/11/2018   CALCIUM 8.7 (L) 07/09/2023   PHOS 3.4 07/09/2023   PROT 5.9 (L) 07/06/2023   ALBUMIN 3.0 (L) 07/09/2023   BILITOT 2.7 (H) 07/06/2023    ALKPHOS 96 07/06/2023   AST 47 (H) 07/06/2023   ALT 38 07/06/2023   ANIONGAP 5 07/09/2023    CBG (last 3)  No results for input(s): "GLUCAP" in the last  72 hours.    Coagulation Profile: Recent Labs  Lab 07/03/23 1354 07/05/23 0954 07/06/23 0353  INR 1.4* 1.5* 1.7*     Radiology Studies: DG Abd 1 View Result Date: 07/07/2023 CLINICAL DATA:  16109 Abdominal distention 60454 EXAM: ABDOMEN - 1 VIEW COMPARISON:  07/04/2023 FINDINGS: Diffuse gaseous distension of the colon. No abnormally dilated loops of small bowel are evident. No free intraperitoneal identified. No radio-opaque calculi or other significant radiographic abnormality are seen. IMPRESSION: Diffuse gaseous distension of the colon, suggestive of ileus. Electronically Signed   By: Duanne Guess D.O.   On: 07/07/2023 19:00       Kathlen Mody M.D. [facility] 07/09/2023, 9:32 AM  Available via Epic secure chat 7am-7pm After 7 pm, please refer to night coverage provider listed on amion.

## 2023-07-10 ENCOUNTER — Inpatient Hospital Stay (HOSPITAL_COMMUNITY): Payer: BLUE CROSS/BLUE SHIELD

## 2023-07-10 DIAGNOSIS — K7290 Hepatic failure, unspecified without coma: Secondary | ICD-10-CM | POA: Diagnosis not present

## 2023-07-10 DIAGNOSIS — K7460 Unspecified cirrhosis of liver: Secondary | ICD-10-CM | POA: Diagnosis not present

## 2023-07-10 HISTORY — PX: IR PARACENTESIS: IMG2679

## 2023-07-10 HISTORY — PX: IR RADIOLOGIST EVAL & MGMT: IMG5224

## 2023-07-10 LAB — RENAL FUNCTION PANEL
Albumin: 3.1 g/dL — ABNORMAL LOW (ref 3.5–5.0)
Anion gap: 8 (ref 5–15)
BUN: 29 mg/dL — ABNORMAL HIGH (ref 6–20)
CO2: 22 mmol/L (ref 22–32)
Calcium: 8.6 mg/dL — ABNORMAL LOW (ref 8.9–10.3)
Chloride: 95 mmol/L — ABNORMAL LOW (ref 98–111)
Creatinine, Ser: 1.43 mg/dL — ABNORMAL HIGH (ref 0.61–1.24)
GFR, Estimated: 59 mL/min — ABNORMAL LOW (ref >60)
Glucose, Bld: 107 mg/dL — ABNORMAL HIGH (ref 70–99)
Phosphorus: 3.2 mg/dL (ref 2.5–4.6)
Potassium: 4.4 mmol/L (ref 3.5–5.1)
Sodium: 125 mmol/L — ABNORMAL LOW (ref 135–145)

## 2023-07-10 MED ORDER — MIDAZOLAM HCL 2 MG/2ML IJ SOLN
INTRAMUSCULAR | Status: AC
  Filled 2023-07-10: qty 4

## 2023-07-10 MED ORDER — LIDOCAINE-EPINEPHRINE 1 %-1:100000 IJ SOLN
INTRAMUSCULAR | Status: AC
  Filled 2023-07-10: qty 1

## 2023-07-10 MED ORDER — LIDOCAINE HCL 1 % IJ SOLN
INTRAMUSCULAR | Status: AC
  Filled 2023-07-10: qty 20

## 2023-07-10 MED ORDER — LIDOCAINE HCL 1 % IJ SOLN
20.0000 mL | Freq: Once | INTRAMUSCULAR | Status: AC
  Administered 2023-07-10: 10 mL
  Filled 2023-07-10: qty 20

## 2023-07-10 MED ORDER — LORAZEPAM 0.5 MG PO TABS
0.5000 mg | ORAL_TABLET | Freq: Once | ORAL | Status: AC
  Administered 2023-07-10: 0.5 mg via ORAL
  Filled 2023-07-10: qty 1

## 2023-07-10 NOTE — Progress Notes (Signed)
   07/09/23 2315  Pain Assessment  Pain Scale 0-10  Pain Score 8  Pain Type Chronic pain  Pain Location Abdomen  Pain Orientation Right;Left  Pain Radiating Towards Back  Pain Descriptors / Indicators Aching  Pain Frequency Constant  Pain Onset On-going  Patients Stated Pain Goal 0  Pain Intervention(s) Emotional support;MD notified (Comment)  Provider Notification  Provider Name/Title Dr. Loney Loh  Date Provider Notified 07/09/23  Time Provider Notified 2315  Method of Notification Page  Notification Reason Change in status  Provider response See new orders  Date of Provider Response 07/09/23   Patient is stating his pain is a 8/9 out of 10. Slightly worse than earlier in the night. The scheduled elavil did not help to relax him enough to ease the pain. Dr. Loney Loh made aware.  Order received for one time order of Fentanyl IV.  This was given at 0017 with pain reduction to 4/10.  Will continue to monitor patient.  Bernie Covey RN

## 2023-07-10 NOTE — Procedures (Signed)
PROCEDURE SUMMARY:  Successful US guided paracentesis from left lateral abdomen.  Yielded 5 liters of clear yellow fluid.  No immediate complications.  Patient tolerated well.  EBL = trace  Specimen not sent for labs.  Gael Londo Charmian Muff PA-C 07/10/2023 8:53 AM

## 2023-07-10 NOTE — Progress Notes (Signed)
   07/09/23 2134  Pain Assessment  Pain Scale 0-10  Pain Score 8  Pain Type Chronic pain  Pain Location Abdomen  Pain Orientation Right;Left  Pain Radiating Towards Back  Pain Descriptors / Indicators Aching  Pain Frequency Constant  Pain Onset On-going  Patients Stated Pain Goal 0  Pain Intervention(s) Emotional support  Provider Notification  Provider Name/Title Dr. Loney Loh  Date Provider Notified 07/09/23  Time Provider Notified 2134  Method of Notification Page  Notification Reason Requested by patient/family  Provider response No new orders  Date of Provider Response 07/09/23   Patient c/o increasing abdominal pain 7-8/10.  Abdomen is distended.  He is having frequent bowel movements secondary to Lactulose.  GI ordered US Paracentesis for next day.  He has Tylenol on his list but does not want to take because of risk to his liver function.  Dr. Loney Loh made aware.  Will give his HS Elavil at bedtime to see if this helps to relax him.  Need to be careful with opioids because of his ileus.  Patient understands.  Will call MD back if pain does not lessen.  Bernie Covey RN

## 2023-07-10 NOTE — H&P (Signed)
Chief Complaint: Occluded TIPS  History of Present Illness: Erik Howe is a 53 y.o. male with a medical history significant for anxiety/depression, HTN, obesity, GERD with esophagitis, chronic lower extremity swelling, polyposis and ichthyosis. 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 Lowella Dandy and has required regular paracenteses since the procedure albeit with less frequency and volume. He has complained of mild confusion since the procedure. A TIPS ultrasound 09/26/22 showed the TIPS to be patent.    His is followed closely by Dr. Tomasa Rand with GI and he is undergoing work up with [facility] for a liver transplant. He was hospitalized twice in December 2024 for hepatic encephalopathy. He presented to the ED 07/03/23 with abdominal pain, weakness, urinary retention and shortness of breath. The patient had been requiring a paracentesis approximately twice per month but felt like the fluid was reaccumulating faster. Work up in the ED showed a bilirubin of 3.6, lactic acid 2.2, AKI (likely secondary to hepatorenal syndrome), hypotension and abdominal distention. A paracentesis was performed in IR 07/04/23 with 6.1 L of clear yellow fluid removed. Imaging including [place] and a CT venogram showed the TIPS to be occluded.    Imaging reviewed by Dr. Elby Showers with the recommendation to proceed with a TIPS thrombectomy and revision under moderate sedation. The procedure was discussed with the hospitalist and GI teams with agreement to proceed.   TIPS thrombectomy attempt was thwarted on 1/16 due to mean right atrial pressures being measured as high as 31 mmHg.  Cardiology consultation occurred with repeat ECHO which was normal.  [facility] transplant has been consulted, and there are apparent plans to receive the patient however no beds  have become available.  He continues to require paracenteses.  His hepatorenal syndrome is being managed medically and slightly improved.  His bilirubin continues to increase.  No interim encephalopathy.    Past Medical History:  Diagnosis Date   Anxiety    Arthritis    Cellulitis 12/18/2012   left foot   Cirrhosis of liver (HCC)    Depression    GERD (gastroesophageal reflux disease)    Hypertension    no longer a problem   Obesity    Varicose veins     Past Surgical History:  Procedure Laterality Date   BIOPSY  08/25/2022   Procedure: BIOPSY;  Surgeon: Jenel Lucks, MD;  Location: [facility];  Service: Gastroenterology;;   CHOLECYSTECTOMY     COLONOSCOPY WITH PROPOFOL N/A 08/25/2022   Procedure: COLONOSCOPY WITH PROPOFOL;  Surgeon: Jenel Lucks, MD;  Location: [facility];  Service: Gastroenterology;  Laterality: N/A;   ENDOVENOUS ABLATION SAPHENOUS VEIN W/ LASER Left 09-30-2013   left greater saphenous vein   ESOPHAGOGASTRODUODENOSCOPY (EGD) WITH PROPOFOL N/A 08/25/2022   Procedure: ESOPHAGOGASTRODUODENOSCOPY (EGD) WITH PROPOFOL;  Surgeon: Jenel Lucks, MD;  Location: [facility];  Service: Gastroenterology;  Laterality: N/A;   HOT HEMOSTASIS N/A 08/25/2022   Procedure: HOT HEMOSTASIS (ARGON PLASMA COAGULATION/BICAP);  Surgeon: Jenel Lucks, MD;  Location: [facility];  Service: Gastroenterology;  Laterality: N/A;   IR IVUS EACH ADDITIONAL NON CORONARY VESSEL  07/20/2022   IR PARACENTESIS  05/03/2022   IR PARACENTESIS  05/18/2022   IR PARACENTESIS  06/02/2022   IR PARACENTESIS  06/10/2022   IR PARACENTESIS  06/24/2022   IR PARACENTESIS  06/30/2022   IR PARACENTESIS  07/08/2022  IR PARACENTESIS  07/15/2022   IR PARACENTESIS  07/20/2022   IR PARACENTESIS  07/29/2022   IR PARACENTESIS  08/12/2022   IR PARACENTESIS  08/19/2022   IR PARACENTESIS  09/06/2022   IR PARACENTESIS  09/22/2022   IR PARACENTESIS  10/21/2022   IR PARACENTESIS  11/17/2022   IR PARACENTESIS   12/07/2022   IR PARACENTESIS  12/21/2022   IR PARACENTESIS  01/06/2023   IR PARACENTESIS  01/23/2023   IR PARACENTESIS  02/23/2023   IR PARACENTESIS  03/09/2023   IR PARACENTESIS  03/31/2023   IR PARACENTESIS  05/01/2023   IR PARACENTESIS  05/16/2023   IR PARACENTESIS  06/19/2023   IR PARACENTESIS  07/04/2023   IR TIPS  07/20/2022   IR TIPS REVISION MOD SED  07/06/2023   IR US GUIDE VASC ACCESS RIGHT  07/20/2022   IR US GUIDE VASC ACCESS RIGHT  07/20/2022   IR US GUIDE VASC ACCESS RIGHT  07/06/2023   POLYPECTOMY  08/25/2022   Procedure: POLYPECTOMY;  Surgeon: Jenel Lucks, MD;  Location: [facility];  Service: Gastroenterology;;   RADIOLOGY WITH ANESTHESIA N/A 07/20/2022   Procedure: TIPS;  Surgeon: Richarda Overlie, MD;  Location: [facility] OR;  Service: Radiology;  Laterality: N/A;    Allergies: Tylenol [acetaminophen], Dust mite extract, Paroxetine, Pollen extract, and Tramadol  Medications: Prior to Admission medications   Medication Sig Start Date End Date Taking? Authorizing Provider  albuterol (VENTOLIN HFA) 108 (90 Base) MCG/ACT inhaler Inhale 2 puffs into the lungs every 6 (six) hours as needed for shortness of breath 04/11/22  Yes   amitriptyline (ELAVIL) 10 MG tablet Take 1 tablet (10 mg total) by mouth at bedtime. 09/15/22  Yes   ammonium lactate (LAC-HYDRIN) 12 % lotion Apply topically as needed for dry skin. Twice a day 03/02/23  Yes   Baclofen 5 MG TABS Take 2 tablets (10 mg total) by mouth 3 (three) times daily. 02/02/23  Yes Drazek, Dawn, CRNP  bismuth subsalicylate (PEPTO BISMOL) 262 MG/15ML suspension Take 30 mLs by mouth every 6 (six) hours as needed for indigestion.   Yes [provider]  clobetasol ointment (TEMOVATE) 0.05 % Apply topically 2 (two) times a day. To affected area (right hand) 03/02/23  Yes   diphenhydrAMINE (BENADRYL) 50 MG tablet Take 50 mg by mouth every 8 (eight) hours as needed for itching or allergies.   Yes [provider]  docusate sodium (COLACE)  100 MG capsule Take 200 mg by mouth daily as needed (constipation.).   Yes [provider]  lactulose (CHRONULAC) 10 GM/15ML solution Take 3.33 g by mouth 3 (three) times daily.   Yes [provider]  magnesium oxide (MAG-OX) 400 (240 Mg) MG tablet Take 400 mg by mouth daily.   Yes [provider]  midodrine (PROAMATINE) 5 MG tablet Take 1 tablet (5 mg total) by mouth 3 (three) times daily with meals. 06/11/23 07/11/23 Yes Pahwani, Daleen Bo, MD  ondansetron (ZOFRAN-ODT) 8 MG disintegrating tablet Take 1 tablet (8 mg total) by mouth 3 (three) times daily as needed for nausea. 06/07/22  Yes   pantoprazole (PROTONIX) 40 MG tablet Take 1 tablet (40 mg total) by mouth 2 (two) times daily. 06/29/23  Yes Jenel Lucks, MD  potassium chloride SA (KLOR-CON M) 20 MEQ tablet Take 2 tablets (40 mEq total) by mouth daily. 09/26/22  Yes Jenel Lucks, MD  silver sulfADIAZINE (SILVADENE) 1 % cream Apply topically 2 (two) times a day. To affected areas Patient taking differently:  Apply 1 Application topically 2 (two) times daily as needed (for burn, itch). 03/02/23  Yes   spironolactone (ALDACTONE) 100 MG tablet Take 2 tablets (200 mg total) by mouth daily. 05/24/23  Yes Dorcas Carrow, MD  sucralfate (CARAFATE) 1 g tablet Take 1 tablet (1 g total) by mouth 4 (four) times daily -  with meals and at bedtime. Please dissolve the tablet in a tablespoon of water and drink the liquid 4 times a day 06/06/23  Yes Jenel Lucks, MD  torsemide (DEMADEX) 20 MG tablet Take 2 tablets (40 mg total) by mouth daily. 05/25/23 07/03/23 Yes Dorcas Carrow, MD  furosemide (LASIX) 10 MG/ML injection Inject 4 mLs (40 mg total) into the vein every 12 (twelve) hours. 07/09/23   Kathlen Mody, MD  rifaximin (XIFAXAN) 550 MG TABS tablet Take 1 tablet (550 mg total) by mouth 2 (two) times daily. 07/07/23   Maurilio Lovely D, DO     Family History  Problem Relation Age of Onset   Varicose Veins Mother    Heart  disease Mother    Diabetes Mother    Arthritis Mother    Hypertension Mother    Stroke Father    Heart disease Father    Colon cancer Neg Hx    Stomach cancer Neg Hx    Rectal cancer Neg Hx    Esophageal cancer Neg Hx     Social History   Socioeconomic History   Marital status: Single    Spouse name: Not on file   Number of children: Not on file   Years of education: Not on file   Highest education level: Not on file  Occupational History   Not on file  Tobacco Use   Smoking status: Former    Current packs/day: 0.00    Types: Cigarettes    Quit date: 09/18/2012    Years since quitting: 10.8   Smokeless tobacco: Never  Vaping Use   Vaping status: Never Used  Substance and Sexual Activity   Alcohol use: No   Drug use: No   Sexual activity: Not on file  Other Topics Concern   Not on file  Social History Narrative   Not on file   Social Drivers of Health   Financial Resource Strain: Not on File (10/07/2021)   Received from [employer] [facility] [employer]    Financial Resource Strain: 0  Food Insecurity: No Food Insecurity (07/04/2023)   Hunger Vital Sign    Worried About Running Out of Food in the Last Year: Never true    Ran Out of Food in the Last Year: Never true  Transportation Needs: Patient Unable To Answer (07/04/2023)   PRAPARE - Transportation    Lack of Transportation (Medical): Patient unable to answer    Lack of Transportation (Non-Medical): Patient unable to answer  Physical Activity: Not on File (10/07/2021)   Received from [place], [place]   Physical Activity    Physical Activity: 0  Stress: Not on File (10/07/2021)   Received from [facility], [place]   Stress    Stress: 0  Social Connections: Not on File (03/02/2023)   Received from [employer]    Connectedness: 0    Review of Systems: A 12 point ROS discussed and pertinent positives are indicated in the HPI above.  All other systems are negative.  Vital Signs: BP 123/77 (BP  Location: Left Arm)   Pulse 98   Temp 98 F (36.7 C) (Oral)  Resp 18   Ht 6\' 1"  (1.854 m)   Wt 126.1 kg   SpO2 100%   BMI 36.68 kg/m     Physical Exam Constitutional:      General: He is not in acute distress. HENT:     Head: Normocephalic.     Mouth/Throat:     Mouth: Mucous membranes are moist.     Comments: MP2 Eyes:     General: No scleral icterus. Cardiovascular:     Rate and Rhythm: Normal rate and regular rhythm.  Pulmonary:     Effort: Pulmonary effort is normal. No respiratory distress.  Abdominal:     General: There is distension.  Skin:    General: Skin is warm and dry.     Coloration: Skin is not jaundiced.  Neurological:     Mental Status: He is alert and oriented to person, place, and time.     Imaging: CTV AP 07/04/23  Occluded TIPS, hepatic vein aspect of endograft just shy of caval confluence.  Patent portal system.   Labs:  CBC: Recent Labs    07/04/23 0551 07/05/23 0356 07/06/23 0353 07/07/23 0743  WBC 6.1 8.3 7.0 6.4  HGB 9.1* 9.5* 8.4* 8.3*  HCT 25.2* 26.9* 24.3* 24.0*  PLT 100* 107* 88* 96*    COAGS: Recent Labs    06/08/23 0646 07/03/23 1354 07/05/23 0954 07/06/23 0353  INR 1.8* 1.4* 1.5* 1.7*    BMP: Recent Labs    07/07/23 0743 07/08/23 0944 07/09/23 0731 07/10/23 0113  NA 125* 124* 128* 125*  K 4.9 4.5 4.0 4.4  CL 96* 95* 97* 95*  CO2 21* 21* 26 22  GLUCOSE 81 121* 101* 107*  BUN 26* 26* 27* 29*  CALCIUM 8.6* 8.7* 8.7* 8.6*  CREATININE 1.90* 1.59* 1.40* 1.43*  GFRNONAA 42* 52* >60 59*    LIVER FUNCTION TESTS: Recent Labs    07/03/23 1354 07/04/23 0551 07/05/23 0954 07/06/23 0353 07/09/23 0731 07/10/23 0113  BILITOT 3.6* 3.6* 2.2* 2.7*  --   --   AST 75* 66* 63* 47*  --   --   ALT 50* 48* 46* 38  --   --   ALKPHOS 125 108 115 96  --   --   PROT 6.0* 5.8* 5.8* 5.9*  --   --   ALBUMIN 2.9* 3.0* 2.9* 3.7 3.0* 3.1*    TUMOR MARKERS: No results for input(s): "AFPTM", "CEA", "CA199", "CHROMGRNA"  in the last 8760 hours.  Assessment and Plan: 53 year old male with history of EtOH/MASH cirrhosis status post TIPS creation on 07/20/22 now with recurrent refractory ascites and imaging evidence of TIPS occlusion.  Likely etiology is insufficient extension of endograft to hepatic/IVC confluence.  He has associated hepatorenal syndrome and hyperbilirubinemia, likely contributed to TIPS occlusion.    I discussed the rationale, periprocedural expectations, and long term outcomes after TIPS thrombectomy and revision.  He is amenable and wishes to proceed as soon as possible.  Plan for TIPS thrombectomy with possible revision and extension of stent complex to IVC confluence to prevent further occlusion.  Plan for single drug anxiolysis if performed prior to 14:00 today.    Marliss Coots, MD Pager: [phone] Clinic: [phone]   I spent a total of 20 Minutes in face to face in clinical consultation, greater than 50% of which was counseling/coordinating care for TIPS occlusion.

## 2023-07-10 NOTE — Progress Notes (Signed)
Interventional Radiology Update Note  The patient arrived to IR and was moved to the procedure table. He then stated he did not want to proceed with the procedure.  I spoke at length with the patient and let him know the team here had no ETA from the transplant team in [place] for when a bed would be available and the procedure should only take about an hour, but he was adamant that he was too worried about missing his window of transfer to [place].  After extensive re-assurance to the patient that we would not allow him to miss his ride to [place] if it became time during his procedure, he ultimately was profusely apologetic but refused the procedure.    We may be able to accommodate later in the week if the patient changes his mind and remains at [facility].  Marliss Coots, MD Pager: [phone] Clinic: [phone]

## 2023-07-10 NOTE — Progress Notes (Signed)
Report called to [facility], Care link present, VSS, AVS, EMTALA and medical necessity handed to care link staff, pt. Transferred to [facility] via ambulance in stable condition with all belongings  Margarita Grizzle

## 2023-07-10 NOTE — Sedation Documentation (Signed)
Patient refused procedure from table.(No meds administered and procedure T/O not performed) d/t perceived time restraints involving  "missing a ride to [facility]" Dr. Elby Showers educated to importance of having the procedure, while encouraging him to have it done at the time. Patient was fully educated and encouraged, and refused procedure. Writer called report to primary RN on 5mw. Transported via teletracking, VS stable and w/in normal limits.

## 2023-07-10 NOTE — Plan of Care (Signed)

## 2023-07-10 NOTE — Progress Notes (Signed)
PROGRESS NOTE    Erik Howe  VHQ:[id number] DOB: 1971-01-12 DOA: 07/03/2023 PCP: [name], PA-C   Brief Narrative:  This 53 yr old male who presented to [facility] on 07/03/2023 with complaints of progressively worsening shortness of breath and abdominal distention. 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 Since that procedure the patient has had multiple paracentesis ( about q2 weeks), and he has been admitted for hepatic encephalopathy. 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 This was suspected to be related to TIPS occlusion, resulting in rapid reaccumulation of fluid. Revision of the TIPS was attempted by IR on 1/16, but right heart pressures were elevated and therefore, Cardiology was asked to evaluate. Fortunately, echo on 1/16 does not show this and no further workup is needed. [facility] transplant team was notified of this patient's hospitalization and is requesting transfer to their facility for further care. He continues to remain on Lasix for diuresis along with albumin. He is now in stable condition for transfer with further recommendations per Hepatology.   Assessment & Plan:   Principal Problem:   Decompensated cirrhosis (HCC) Active Problems:   Hyponatremia   AKI (acute kidney injury) (HCC)  Decompensated liver cirrhosis complicated by refractory ascites: NASH Cirrhosis/ TIPS Occlusion: Patient is being transferred to [facility] for TIPS procedure. Continue with octreotide, midodrine and rifaximin.  Continue diuresis with IV lasix.  GI on board and appreciate recommendations.  Patient was rescheduled for TIPS revision / thrombectomy with possible extension of the stent complex to IVC to prevent further occlusion.   Patient was agreeable to the procedure but when patient arrived to the IR patient refused to have procedure as he was scared that this may interfere with his transfer to [place]. Patient underwent paracentesis 5 L of clear fluid drained report pending   Acute kidney injury possibly hepatorenal syndrome: Serum creatinine improving. Creatinine at 1.43 today.    Hepatic Encephalopathy: Appears to have improved.  Continue with lactulose and rifaximin.    Insomnia:  Continue Elavil.   GER: Continue Protonix  Thrombocytopenia -   Due to decompensated hepatic cirrhosis.  Monitor platelet count.   Anemia of chronic disease Hemoglobin stable around 8.    Hyponatremia From fluid overload.  Improving.     Hyperkalemia  Resolved.        Estimated body mass index is 36.68 kg/m as calculated from the following:   Height as of this encounter: 6\' 1"  (1.854 m).   Weight as of this encounter: 126.1 kg.   DVT prophylaxis: SCDs Code Status: Full code Family Communication: No family at bedside Disposition Plan:    Status is: Inpatient Remains inpatient appropriate because:  Patient is being transferred to [facility] for hepatology evaluation and TIPS procedure.   Consultants:  Gastroenterology IR  Procedures: Paracentesis Antimicrobials:  Anti-infectives (From admission, onward)    Start     Dose/Rate Route Frequency Ordered Stop   07/07/23 0000  rifaximin (XIFAXAN) 550 MG TABS tablet        550 mg Oral 2 times daily 07/07/23 1515     07/05/23 1500  cefTRIAXone (ROCEPHIN) 2 g in sodium chloride 0.9 % 100 mL IVPB        2 g 200 mL/hr over 30 Minutes Intravenous To Radiology 07/05/23 1237 07/06/23 1500  07/04/23 0200  rifaximin (XIFAXAN) tablet 550 mg        550 mg Oral 2 times daily 07/04/23 0140        Subjective: Patient was seen and examined at bedside.  Overnight events noted.   Patient reports he is feeling better after paracentesis is complete.  He is  awaiting transfer to [place].  Objective: Vitals:   07/09/23 2048 07/10/23 0417 07/10/23 0816 07/10/23 1154  BP: (!) 115/58 122/72 123/77 116/71  Pulse: 92 89 98 86  Resp: 14 14 18 18   Temp: 98.9 F (37.2 C) 98.3 F (36.8 C) 98 F (36.7 C) 98 F (36.7 C)  TempSrc: Oral Oral Oral Oral  SpO2: 100% 100% 100% 100%  Weight:      Height:        Intake/Output Summary (Last 24 hours) at 07/10/2023 1429 Last data filed at 07/10/2023 1300 Gross per 24 hour  Intake 940.36 ml  Output 3126 ml  Net -2185.64 ml   Filed Weights   07/04/23 1342 07/04/23 1502  Weight: 118 kg 126.1 kg    Examination:  General exam: Appears comfortable, deconditioned, not in any acute distress. Respiratory system: Clear to auscultation. Respiratory effort normal.  RR 16 Cardiovascular system: S1 & S2 heard, RRR. No JVD, murmurs, rubs, gallops or clicks.  Gastrointestinal system: Abdomen is mildly distended, soft and nontender. Normal bowel sounds heard. Central nervous system: Alert and oriented x 3. No focal neurological deficits. Extremities: No edema, no cyanosis, no clubbing Skin: No rashes, lesions or ulcers Psychiatry: Judgement and insight appear normal. Mood & affect appropriate.     Data Reviewed: I have personally reviewed following labs and imaging studies  CBC: Recent Labs  Lab 07/04/23 0551 07/05/23 0356 07/06/23 0353 07/07/23 0743  WBC 6.1 8.3 7.0 6.4  NEUTROABS  --  6.5  --  4.5  HGB 9.1* 9.5* 8.4* 8.3*  HCT 25.2* 26.9* 24.3* 24.0*  MCV 88.4 88.8 90.0 92.0  PLT 100* 107* 88* 96*   Basic Metabolic Panel: Recent Labs  Lab 07/06/23 0353 07/07/23 0743 07/08/23 0944 07/09/23 0731 07/10/23 0113  NA 122* 125* 124* 128* 125*  K 5.4* 4.9 4.5 4.0 4.4  CL 92* 96* 95* 97* 95*  CO2 20* 21* 21* 26 22  GLUCOSE 89 81 121* 101* 107*  BUN 25* 26* 26* 27* 29*  CREATININE 2.29* 1.90* 1.59* 1.40* 1.43*  CALCIUM 8.9 8.6* 8.7* 8.7* 8.6*  PHOS  --   --   --  3.4 3.2   GFR: Estimated  Creatinine Clearance: 84.1 mL/min (A) (by C-G formula based on SCr of 1.43 mg/dL (H)). Liver Function Tests: Recent Labs  Lab 07/04/23 0551 07/05/23 0954 07/06/23 0353 07/09/23 0731 07/10/23 0113  AST 66* 63* 47*  --   --   ALT 48* 46* 38  --   --   ALKPHOS 108 115 96  --   --   BILITOT 3.6* 2.2* 2.7*  --   --   PROT 5.8* 5.8* 5.9*  --   --   ALBUMIN 3.0* 2.9* 3.7 3.0* 3.1*   No results for input(s): "LIPASE", "AMYLASE" in the last 168 hours. Recent Labs  Lab 07/06/23 1541 07/07/23 0743  AMMONIA 33 32   Coagulation Profile: Recent Labs  Lab 07/05/23 0954 07/06/23 0353  INR 1.5* 1.7*   Cardiac Enzymes: No results for input(s): "CKTOTAL", "CKMB", "CKMBINDEX", "TROPONINI" in the last 168 hours. BNP (last 3 results) No results for input(s): "PROBNP" in the last  8760 hours. HbA1C: No results for input(s): "HGBA1C" in the last 72 hours. CBG: No results for input(s): "GLUCAP" in the last 168 hours. Lipid Profile: No results for input(s): "CHOL", "HDL", "LDLCALC", "TRIG", "CHOLHDL", "LDLDIRECT" in the last 72 hours. Thyroid Function Tests: No results for input(s): "TSH", "T4TOTAL", "FREET4", "T3FREE", "THYROIDAB" in the last 72 hours. Anemia Panel: No results for input(s): "VITAMINB12", "FOLATE", "FERRITIN", "TIBC", "IRON", "RETICCTPCT" in the last 72 hours. Sepsis Labs: Recent Labs  Lab 07/04/23 0551  LATICACIDVEN 1.7    Recent Results (from the past 240 hours)  Body fluid culture w Gram Stain     Status: None   Collection Time: 07/03/23  4:39 PM   Specimen: Peritoneal Cavity; Peritoneal Fluid  Result Value Ref Range Status   Specimen Description PERITONEAL CAVITY  Final   Special Requests NONE  Final   Gram Stain NO WBC SEEN NO ORGANISMS SEEN   Final   Culture   Final    NO GROWTH 3 DAYS Performed at [facility], [address], [place], [place] [address]    Report Status 07/07/2023 FINAL  Final   Radiology Studies: IR Paracentesis Result Date:  07/10/2023 INDICATION: 53 year old male with a history of EtOH/MASH cirrhosis with recurrent ascites. Request for therapeutic paracentesis. EXAM: ULTRASOUND GUIDED left PARACENTESIS MEDICATIONS: 1% lidocaine, 9mL. COMPLICATIONS: None immediate. PROCEDURE: Informed written consent was obtained from the patient after a discussion of the risks, benefits and alternatives to treatment. A timeout was performed prior to the initiation of the procedure. Initial ultrasound scanning demonstrates a large amount of ascites within the left lower abdominal quadrant. The left lower abdomen was prepped and draped in the usual sterile fashion. 1% lidocaine was used for local anesthesia. Following this, a 19 gauge, 10-cm, Yueh catheter was introduced. An ultrasound image was saved for documentation purposes. The paracentesis was performed. The catheter was removed and a dressing was applied. The patient tolerated the procedure well without immediate post procedural complication. FINDINGS: A total of approximately 5L of clear yellow fluid was removed. IMPRESSION: Successful ultrasound-guided paracentesis yielding 5 liters of peritoneal fluid. Procedure Performed by: Sherlene Shams, PA-C Electronically Signed   By: Irish Lack M.D.   On: 07/10/2023 13:33   Scheduled Meds:  amitriptyline  10 mg Oral QHS   Chlorhexidine Gluconate Cloth  6 each Topical Daily   feeding supplement  237 mL Oral BID BM   furosemide  40 mg Intravenous Q12H   lactulose  30 g Oral BID   midodrine  10 mg Oral TID WC   pantoprazole  40 mg Oral BID   rifaximin  550 mg Oral BID   Continuous Infusions:  octreotide (SANDOSTATIN) 500 mcg in sodium chloride 0.9 % 250 mL (2 mcg/mL) infusion 50 mcg/hr (07/10/23 0452)     LOS: 6 days    Time spent: 50 mins    Willeen Niece, MD [facility]   If 7PM-7AM, please contact night-coverage

## 2023-07-10 NOTE — Progress Notes (Addendum)
Patient ID: Erik Howe, male   DOB: 04-02-1971, 53 y.o.   MRN: [id number]    Progress Note   Subjective   Day # 7 CC; severely decompensated cirrhosis secondary to EtOH, admitted with weakness/abdominal pain, ascites, and workup revealed occluded TIPS Also with AKI/volume depletion/HRS  Currently awaiting transfer to [facility]  MELD 3.0= 29 on 07/07/2023  Midodrine Octreotide Diuresis per nephrology IV lasix 40 BID  Labs today-NA 125/potassium 4.4/BUN 29/creatinine 1.43  Initially thrombectomy and TIPS revision was not able to be done as right atrial pressures were discordant with echo from 07/08/2022.  Repeat echo shows normal diastolic function and the RA pressure is low, right heart cath not felt indicated.  Patient was able to be rescheduled for TIPS revision/thrombectomy today with possible extension of stent complex to IVC to prevent further occlusion. Patient was initially agreeable but then when he arrived to IR refused to have the procedure done as he was afraid this may interfere with his transfer to [place].  Paracentesis this a.m.-report pending- 5 liters    Objective   Vital signs in last 24 hours: Temp:  [98 F (36.7 C)-98.9 F (37.2 C)] 98 F (36.7 C) (01/20 1154) Pulse Rate:  [86-98] 86 (01/20 1154) Resp:  [14-18] 18 (01/20 1154) BP: (115-123)/(58-77) 116/71 (01/20 1154) SpO2:  [100 %] 100 % (01/20 1154) Last BM Date : 07/10/23 General:  older  White male in NAD, anxious, chronically ill appearing Heart:  Regular rate and rhythm; no murmurs Lungs: Respirations even and unlabored, lungs CTA bilaterally Abdomen:  Soft, protuberant, nontense ascites. Normal bowel sounds. Extremities: Chronic brawny edema Neurologic:  Alert and oriented,  grossly normal neurologically, no asterixis, Psych:  Cooperative. Normal mood and affect, anxious  Intake/Output from previous day: 01/19 0701 - 01/20 0700 In: 1180.4 [P.O.:600; I.V.:580.4] Out: 2026 [Urine:2025;  Stool:1] Intake/Output this shift: No intake/output data recorded.  Lab Results: No results for input(s): "WBC", "HGB", "HCT", "PLT" in the last 72 hours. BMET Recent Labs    07/08/23 0944 07/09/23 0731 07/10/23 0113  NA 124* 128* 125*  K 4.5 4.0 4.4  CL 95* 97* 95*  CO2 21* 26 22  GLUCOSE 121* 101* 107*  BUN 26* 27* 29*  CREATININE 1.59* 1.40* 1.43*  CALCIUM 8.7* 8.7* 8.6*   LFT Recent Labs    07/10/23 0113  ALBUMIN 3.1*   PT/INR No results for input(s): "LABPROT", "INR" in the last 72 hours.     Assessment / Plan:    #56 53 year old white male with decompensated EtOH induced cirrhosis, status post TIPS, admitted with abdominal pain, shortness of breath, large volume ascites and found to have occluded TIPS Initial M ELD 3.0= 29 on admission  Thrombectomy and TIPS revision advised by IR however initial right atrial pressures were discordant with recent echo done on 07/08/2022.  Echo was repeated and showed normal diastolic function and low right atrial pressure, right heart cath not felt indicated.  Patient has been awaiting transfer to [facility] over the past 3 days, as it was not certain when he would be able to be transferred, IR offered to proceed today. When patient went to radiology he became very anxious and decided he did not want to proceed with the procedure here, stating he was afraid he would miss his ride to [place].  Fortunately bed has become available and he will be able to be transferred to [facility] later today  #2 abdominal pain/distention-patient did have 5 L paracentesis earlier this morning for comfort #  3 acute kidney injury can [place] to volume depletion, diuretics and HRS   urine sodium less than 10 Parameters have continued to improve on midodrine and octreotide Completed  albumin Diuresing with IV Lasix per nephrology  #4 ileus-having loose stools on lactulose  #5-hepatic encephalopathy, continue lactulose and  rifaximin   Principal Problem:   Decompensated cirrhosis (HCC) Active Problems:   Hyponatremia   AKI (acute kidney injury) (HCC)     LOS: 6 days   Amy EsterwoodPA-C  07/10/2023, 1:01 PM    Attending physician's note   I have taken history, reviewed the chart and examined the patient. I performed a substantive portion of this encounter, including complete performance of at least one of the key components, in conjunction with the APP. I agree with the Advanced Practitioner's note, impression and recommendations.   Got signout from Dr. Leonides Schanz  Decompensated MASH/ETOH cirrhosis s/p TIPS presented with recurrent ascites-found to have occluded TIPS. Patient was found to have occluded TIPS, HRS.  Elevated MELD score 3.0 29 (1/17).  S/P LVP 5 lit today On midodrine/octreotide/IV Lasix  Refused TIPS revision.  Being transferred to [facility] this afternoon for possible liver transplant.     Edman Circle, MD [facility] [phone]

## 2023-07-21 ENCOUNTER — Encounter (HOSPITAL_COMMUNITY): Payer: Self-pay

## 2023-07-27 ENCOUNTER — Other Ambulatory Visit: Payer: Self-pay

## 2023-07-28 ENCOUNTER — Other Ambulatory Visit: Payer: Self-pay

## 2023-07-28 ENCOUNTER — Other Ambulatory Visit (HOSPITAL_COMMUNITY): Payer: Self-pay

## 2023-07-31 ENCOUNTER — Other Ambulatory Visit: Payer: Self-pay

## 2023-07-31 ENCOUNTER — Ambulatory Visit: Payer: Self-pay | Admitting: Nurse Practitioner

## 2023-08-11 ENCOUNTER — Other Ambulatory Visit (HOSPITAL_COMMUNITY): Payer: Self-pay

## 2023-08-11 ENCOUNTER — Telehealth: Payer: Self-pay

## 2023-08-11 NOTE — Telephone Encounter (Signed)
Pharmacy Patient Advocate Encounter   Received notification from CoverMyMeds that prior authorization for Xifaxan 550MG  tablets is required/requested.   Insurance verification completed.   The patient is insured through [facility] .   Per test claim: PA required; PA started via CoverMyMeds. KEY [id number] . Waiting for clinical questions to populate.

## 2023-08-11 NOTE — Telephone Encounter (Signed)
Pharmacy Patient Advocate Encounter  Received notification from [facility] that Prior Authorization for Xifaxan has been APPROVED from 08/11/2023 to 08/10/2024

## 2023-11-15 ENCOUNTER — Telehealth: Payer: Self-pay | Admitting: Gastroenterology

## 2023-11-15 NOTE — Telephone Encounter (Signed)
 Patient called and stated that he was wondering if he could make a virtual appointment with Dr. Cherryl Corona. Patient is requesting a call back. Please advise.

## 2023-11-16 NOTE — Telephone Encounter (Signed)
 Dr Cherryl Corona-  Are you willing to let patient complete a virtual visit for abdominal pain with oral intake? Patient with hx of cirrhosis (follows with [facility] liver clinic); had TIPS 06/2022 and recent liver transplant 07/11/23. Last appointment with our office was 09/08/22. Had endoscopy 08/25/22 with recommendation for 1 year repeat procedure (not yet completed/scheduled).  Patient has apparently moved to the [place], [place] area so not in close proximity to be seen at out our office in person.

## 2023-11-16 NOTE — Telephone Encounter (Signed)
 Called patient and he stated that he had a bad stomach attack and it caused an ulcer. Patient stated that it was very painful to eat or drink anything. Patient mentioned the reason for the virtual visit was because he has moved this week. Please advise.

## 2023-11-30 ENCOUNTER — Encounter: Payer: Self-pay | Admitting: Gastroenterology

## 2023-11-30 ENCOUNTER — Telehealth (INDEPENDENT_AMBULATORY_CARE_PROVIDER_SITE_OTHER): Admitting: Gastroenterology

## 2023-11-30 DIAGNOSIS — Z860100 Personal history of colon polyps, unspecified: Secondary | ICD-10-CM | POA: Diagnosis not present

## 2023-11-30 DIAGNOSIS — Z09 Encounter for follow-up examination after completed treatment for conditions other than malignant neoplasm: Secondary | ICD-10-CM

## 2023-11-30 DIAGNOSIS — K2100 Gastro-esophageal reflux disease with esophagitis, without bleeding: Secondary | ICD-10-CM | POA: Diagnosis not present

## 2023-11-30 DIAGNOSIS — Z944 Liver transplant status: Secondary | ICD-10-CM

## 2023-11-30 MED ORDER — OMEPRAZOLE 20 MG PO CPDR: 20.0000 mg | DELAYED_RELEASE_CAPSULE | Freq: Two times a day (BID) | ORAL | 3 refills | Status: AC

## 2023-11-30 NOTE — Patient Instructions (Addendum)
 We will call to schedule you a Colonoscopy at the hospital.  We have sent the following medications to your pharmacy for you to pick up at your convenience: Omeprazole  20 mg twice daily.  _______________________________________________________  If your blood pressure at your visit was 140/90 or greater, please contact your primary care physician to follow up on this.  _______________________________________________________  If you are age 53 or older, your body mass index should be between 23-30. Your There is no height or weight on file to calculate BMI. If this is out of the aforementioned range listed, please consider follow up with your Primary Care Provider.  If you are age 49 or younger, your body mass index should be between 19-25. Your There is no height or weight on file to calculate BMI. If this is out of the aformentioned range listed, please consider follow up with your Primary Care Provider.   ________________________________________________________  The [facility] providers would like to encourage you to use MYCHART to communicate with providers for non-urgent requests or questions.  Due to long hold times on the telephone, sending your provider a message by [facility] may be a faster and more efficient way to get a response.  Please allow 48 business hours for a response.  Please remember that this is for non-urgent requests.  _______________________________________________________

## 2023-11-30 NOTE — Progress Notes (Signed)
 Virtual Visit via Video Note  I connected with Erik Howe on 11/30/23 at  8:30 AM EDT by a video enabled telemedicine application and verified that I am speaking with the correct person using two identifiers.  Location: Patient: Home Provider: Office   I discussed the limitations of evaluation and management by telemedicine and the availability of in person appointments. The patient expressed understanding and agreed to proceed.  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  He experiences gastrointestinal symptoms, including diarrhea, nausea, and vomiting, approximately every two weeks. Vomit is described as 'nothing but acid.' These episodes have been ongoing, with the last occurrence being either Thursday or Friday of the previous week. In between these episodes, he sometimes experiences abdominal burning and nausea.  He is currently taking omeprazole  20 mg once daily, but missing a dose results in symptoms returning within 12 hours. He previously took pantoprazole  twice daily and notes that his symptoms were better controlled at that time.   Symptoms primarily occur in the morning, often waking up with a sensation akin to having 'drunk a bottle of acid.' These symptoms typically occur between 8 and 9 AM, sometimes as late as 9:30 AM. His last meal is usually around 9 PM, and he takes his omeprazole  with his morning medications between 7 and 9 AM.  He has a history of severe GERD, with LA grade D reflux esophagitis and severe duodenal ulceration with partial obstruction, noted on EGD in December 2023.  He recently moved to [place], [place]  in order to be closer to his  sister who lives in [place].  He has no other family or support in the [place] area.  He has lost a significant amount of weight in the past 6 months.  He tells me that he weighs 185 pounds now.  His weight was listed at 278 pounds in January of this year.   Some of this was because of his major surgery, but he states that he has significantly improved his diet, now on a 1500-calorie/day   He has a history of numerous adenomatous polyps, with the last colonoscopy in March 2024 revealing eight polyps, following a previous finding of thirteen polyps in Dec 2023.  He was recommended to repeat colonoscopy in 9-12 months following his colonoscopy in March 2024 due to his numerous polyps as well as a technical difficulty with the procedure (only part of the cecum was visualized)   Observations/Objective:  Patient was alert and interactive during the encounter.  Appeared well.  Assessment and Plan:   53 year old male with history of severe erosive esophagitis and peptic ulcer disease, with frequent breakthrough symptoms on low-dose omeprazole .  Given his history of severe erosive esophagitis and frequent symptoms, I think maintaining him on a higher dose of acid suppression is needed.  Gastroesophageal reflux disease with esophagitis Chronic GERD with esophagitis, symptoms persist despite current omeprazole  regimen. History of severe esophagitis and ulcers. Weight loss has reduced symptoms but medication remains inadequate. - Change omeprazole  to twice daily. - Advise over-the-counter Pepcid at night. - Recommend Gaviscon at night. - Coordinate prescription change with nursing staff.  Colonic polyps Multiple colonic polyps with history of numerous polyps. Follow-up colonoscopy overdue. Discussed  hospital setting for procedure due to complex medical history.  We discussed the logistical problems in preparing for colonoscopy when he lives 3 hours away.  I suggested he consider looking at  gastroenterologist providers closer to where he lives, although I am happy to continue to follow him. - Schedule colonoscopy for September in hospital setting. - Consider local gastroenterologist in [facility] area for future care.  Status post liver transplant January 2025 Continue management per [facility] team  Recording duration: 21 minutes         Follow Up Instructions:    I discussed the assessment and treatment plan with the patient. The patient was provided an opportunity to ask questions and all were answered. The patient agreed with the plan and demonstrated an understanding of the instructions.   The patient was advised to call back or seek an in-person evaluation if the symptoms worsen or if the condition fails to improve as anticipated.  I provided 35 minutes of non-face-to-face time during this encounter.   Elois Hair, MD   Colonoscopy Jun 17, 2022 (Positive FIT test) Impression Extremely difficult colonoscopy due to excessive looping, inadequate prep and patient intolerance (coughing, retching): cecum not visualized - Preparation of the colon was fair.  - Six 3 to 8 mm polyps in the ascending colon, removed with a cold snare. Resected and retrieved.  - Five 3 to 7 mm polyps in the transverse colon, removed with a cold snare. Resected and retrieved.  - One 10 mm polyp in the sigmoid colon, removed with a cold snare. Resected and retrieved.  - One 8 mm polyp in the rectum, removed with a cold snare. Resected and retrieved. Clip (MR conditional) was placed.  - A few non-bleeding colonic angioectasias.  - Non-bleeding hemorrhoids.   EGD Jun 17, 2022 (Variceal screening, nausea/vomiting) Impression - The examined portions of the nasopharynx, oropharynx and larynx were normal.  - LA Grade D reflux esophagitis with no bleeding.  - Portal hypertensive gastropathy. Biopsied.  - 6 cm hiatal hernia.  - Partially obstructing non-bleeding  severe duodenal ulceration. Biopsied.  - Erythematous duodenopathy   1. Surgical [P], duodenal ulcers - DUODENAL MUCOSA WITH REACTIVE/REGENERATIVE TYPE CHANGES CONSISTENT WITH ADJACENT ULCER - NEGATIVE FOR MALIGNANCY - SEE NOTE 2. Surgical [P], duodenal - DUODENAL MUCOSA WITH PROMINENT BRUNNER'S GLANDS, FOCAL FOVEOLAR METAPLASIA AND REACTIVE/REGENERATIVE CHANGES CONSISTENT WITH CHRONIC PEPTIC DUODENITIS - NEGATIVE FOR MALIGNANCY - SEE NOTE 3. Surgical [P], gastric - ANTRAL AND OXYNTIC MUCOSA WITH FOCAL, SLIGHT CHRONIC INFLAMMATION - OXYNTIC MUCOSA WITH EARLY PROTON PUMP INHIBITOR TYPE EFFECT/EARLY FUNDIC GLAND POLYP LIKE CHANGES - IMMUNOHISTOCHEMICAL STAIN FOR HELICOBACTER ORGANISMS IS NEGATIVE 4. Surgical [P], colon, ascending -6, transverse - 5, polyp (11) - TUBULAR ADENOMA(S) (MULTIPLE FRAGMENTS) - NEGATIVE FOR HIGH-GRADE DYSPLASIA OR MALIGNANCY - SEE NOTE 5. Surgical [P], colon, sigmoid and rectal, polyp (2) - TUBULOVILLOUS ADENOMA (1 FRAGMENT) - TUBULAR ADENOMA (1 FRAGMENT) - NEGATIVE FOR HIGH-GRADE DYSPLASIA OR MALIGNANCY   -----------------------------------------------------------------------------------------------     Colonoscopy Aug 25, 2022 (incomplete colonoscopy, surveillance >10 adenomas Technically difficult procedure due to significant looping.  Cecum was only partially visualized Impression - Three 3 to 6 mm polyps in the ascending colon, removed with a cold snare. Resected and retrieved.  - One 2 mm polyp in the ascending colon, removed with a jumbo cold forceps. Resected and retrieved.  - One 6 mm polyp in the transverse colon, removed with a cold snare. Resected and retrieved.  - One 3 mm polyp in  the descending colon, removed with a cold snare. Resected and retrieved.  - One 5 mm polyp in the sigmoid colon, removed with a cold snare. Resected and retrieved.  - Hemoclip from previous polypectomy in the rectum with mucosa suspicious for remnantadenoma,  biopsied.  - A single colonic angiodysplastic lesion. Treated with argon plasma coagulation (APC).  - The distal rectum and anal verge are normal on retroflexion view.  - Not all of the cecum was visualized and the appendiceal orifice was never definitively identified. The patient's refractory ascites is likely contributing to the technical difficulty of the procedure.   EGD Aug 25, 2022 Impression - The examined portions of the nasopharynx, oropharynx and larynx were normal. - Z-line irregular. The previously noted esophagitis has healed.  - Grade I esophageal varices. These do no require banding or beta blocker prophylaxis  - 5 cm hiatal hernia.  - Portal hypertensive gastropathy. Biopsied.  - Focal granular mucosa in the second portion of the duodenum. Likely healing inflammation. Biopsied to exclude dysplasia  - Duodenal scarring from previous ulceration.   Path A. DUODENUM, INFLAMED MUCOSA, BIOPSY: - Duodenal mucosa with reactive histopathologic changes - Negative for increased intraepithelial lymphocytes or villous architectural changes  B. STOMACH, BIOPSY: - Gastric oxyntic mucosa with parietal cell hyperplasia as can be seen in hypergastrinemic states such as PPI therapy. - Helicobacter pylori-like organisms are not identified on routine HE stain  C. COLON, ASCENDING, TRANSVERSE, POLYPECTOMY: - Tubular adenoma(s) - Negative for high-grade dysplasia or malignancy  D. COLON, DESCENDING, SIGMOID, POLYPECTOMY: - Tubular adenoma(s) - Negative for high-grade dysplasia or malignancy  E. RECTAL POLYPECTOMY SITE, BIOPSY: - Polypoid rectal mucosa with mild nonspecific hyperplastic changes - Negative for dysplasia or malignancy        TTE Jul 08, 2022 Impression 1. Left ventricular ejection fraction, by estimation, is 65 to 70%. The  left ventricle has normal function. The left ventricle has no regional  wall motion abnormalities. Left ventricular diastolic parameters were   normal.   2. Right ventricular systolic function is normal. The right ventricular  size is normal.   3. The mitral valve is normal in structure. No evidence of mitral valve  regurgitation. No evidence of mitral stenosis.   4. The aortic valve is tricuspid. There is mild calcification of the  aortic valve. There is mild thickening of the aortic valve. Aortic valve  regurgitation is not visualized. Aortic valve sclerosis is present, with  no evidence of aortic valve stenosis.   5. The inferior vena cava is normal in size with greater than 50%  respiratory variability, suggesting right atrial pressure of 3 mmHg.    CT abdomen/pelvis Sept 21, 2023 CLINICAL DATA:  Abdominal pain   EXAM: CT ABDOMEN AND PELVIS WITH CONTRAST   TECHNIQUE: Multidetector CT imaging of the abdomen and pelvis was performed using the standard protocol following bolus administration of intravenous contrast.   RADIATION DOSE REDUCTION: This exam was performed according to the departmental dose-optimization program which includes automated exposure control, adjustment of the mA and/or kV according to patient size and/or use of iterative reconstruction technique.   CONTRAST:  75mL OMNIPAQUE  IOHEXOL  350 MG/ML SOLN   COMPARISON:  02/22/2006   FINDINGS: Lower chest: Visualized lower lung fields are clear.   Hepatobiliary: Mild lobulations are seen in the margin of the liver. No focal abnormalities are seen. There is no dilation of bile ducts. Surgical clips are seen in gallbladder fossa.   Pancreas: No focal abnormalities are seen.  Spleen: Spleen measures 13.6 cm in maximum diameter.   Adrenals/Urinary Tract: Adrenals are unremarkable. There is no hydronephrosis. There are no renal or ureteral stones. There are few smooth marginated low-density lesions in the left kidney suggesting possible cysts. Urinary bladder is not distended.   Stomach/Bowel: Small hiatal hernia is seen. Small bowel loops are not  dilated. Appendix is not seen. There is no significant wall thickening in colon.   Vascular/Lymphatic: Scattered arterial calcifications are seen.   Reproductive: Prostate appears smaller than usual in size.   Other: There is large ascites. There is no pneumoperitoneum. Small umbilical hernia containing fat is seen. There is subcutaneous edema, possibly anasarca.   Musculoskeletal: No acute findings are seen.   IMPRESSION: There is no evidence of intestinal obstruction or pneumoperitoneum. There is no hydronephrosis.   Lobulations in the margin of liver suggests possible cirrhosis. Spleen is enlarged in size. There is large ascites. Small hiatal hernia.   Other findings as described in the body of the report.       US  Sept 21, 2023 FINDINGS: Gallbladder: Surgically absent   Common bile duct: Diameter: Not visualized   Liver: Coarse nodular appearance favoring cirrhosis. Poor sonic penetration. Portal vein is patent on color Doppler imaging with normal direction of blood flow towards the liver.   IVC: Not visualized   Pancreas: Not visualized   Spleen: Size and appearance within normal limits. Splenic volume 203 cc (within normal limits).   Right Kidney: Length: 14.3. Echogenicity within normal limits. No mass or hydronephrosis visualized.   Left Kidney: Length: 13.2. Echogenicity within normal limits. No mass or hydronephrosis visualized.   Abdominal aorta: Not visualized   Other findings: At least moderate ascites. Body habitus limits visualization of various structures.   IMPRESSION: 1. Coarse nodular liver favoring cirrhosis. Poor sonic penetration of the liver. 2. Nonvisualization of various structures including the aorta, IVC, pancreas, common bile duct primarily attributable to body habitus. 3. Moderate ascites.  Past Medical History:  Diagnosis Date   Anxiety    Arthritis    Cellulitis 12/18/2012   left foot   Cirrhosis of liver (HCC)     Depression    GERD (gastroesophageal reflux disease)    Hypertension    no longer a problem   Obesity    Varicose veins      Past Surgical History:  Procedure Laterality Date   BIOPSY  08/25/2022   Procedure: BIOPSY;  Surgeon: Elois Hair, MD;  Location: [facility];  Service: Gastroenterology;;   CHOLECYSTECTOMY     COLONOSCOPY WITH PROPOFOL  N/A 08/25/2022   Procedure: COLONOSCOPY WITH PROPOFOL ;  Surgeon: Elois Hair, MD;  Location: [facility];  Service: Gastroenterology;  Laterality: N/A;   ENDOVENOUS ABLATION SAPHENOUS VEIN W/ LASER Left 09-30-2013   left greater saphenous vein   ESOPHAGOGASTRODUODENOSCOPY (EGD) WITH PROPOFOL  N/A 08/25/2022   Procedure: ESOPHAGOGASTRODUODENOSCOPY (EGD) WITH PROPOFOL ;  Surgeon: Elois Hair, MD;  Location: [facility];  Service: Gastroenterology;  Laterality: N/A;   HOT HEMOSTASIS N/A 08/25/2022   Procedure: HOT HEMOSTASIS (ARGON PLASMA COAGULATION/BICAP);  Surgeon: Elois Hair, MD;  Location: [facility];  Service: Gastroenterology;  Laterality: N/A;   IR IVUS EACH ADDITIONAL NON CORONARY VESSEL  07/20/2022   IR PARACENTESIS  05/03/2022   IR PARACENTESIS  05/18/2022   IR PARACENTESIS  06/02/2022   IR PARACENTESIS  06/10/2022   IR PARACENTESIS  06/24/2022   IR PARACENTESIS  06/30/2022   IR PARACENTESIS  07/08/2022   IR PARACENTESIS  07/15/2022  IR PARACENTESIS  07/20/2022   IR PARACENTESIS  07/29/2022   IR PARACENTESIS  08/12/2022   IR PARACENTESIS  08/19/2022   IR PARACENTESIS  09/06/2022   IR PARACENTESIS  09/22/2022   IR PARACENTESIS  10/21/2022   IR PARACENTESIS  11/17/2022   IR PARACENTESIS  12/07/2022   IR PARACENTESIS  12/21/2022   IR PARACENTESIS  01/06/2023   IR PARACENTESIS  01/23/2023   IR PARACENTESIS  02/23/2023   IR PARACENTESIS  03/09/2023   IR PARACENTESIS  03/31/2023   IR PARACENTESIS  05/01/2023   IR PARACENTESIS  05/16/2023   IR PARACENTESIS  06/19/2023   IR PARACENTESIS  07/04/2023   IR PARACENTESIS  07/10/2023    IR RADIOLOGIST EVAL & MGMT  07/10/2023   IR TIPS  07/20/2022   IR TIPS REVISION MOD SED  07/06/2023   IR US  GUIDE VASC ACCESS RIGHT  07/20/2022   IR US  GUIDE VASC ACCESS RIGHT  07/20/2022   IR US  GUIDE VASC ACCESS RIGHT  07/06/2023   POLYPECTOMY  08/25/2022   Procedure: POLYPECTOMY;  Surgeon: Elois Hair, MD;  Location: [facility];  Service: Gastroenterology;;   RADIOLOGY WITH ANESTHESIA N/A 07/20/2022   Procedure: TIPS;  Surgeon: Elene Griffes, MD;  Location: [facility] OR;  Service: Radiology;  Laterality: N/A;   Family History  Problem Relation Age of Onset   Varicose Veins Mother    Heart disease Mother    Diabetes Mother    Arthritis Mother    Hypertension Mother    Stroke Father    Heart disease Father    Colon cancer Neg Hx    Stomach cancer Neg Hx    Rectal cancer Neg Hx    Esophageal cancer Neg Hx    Social History   Tobacco Use   Smoking status: Former    Current packs/day: 0.00    Types: Cigarettes    Quit date: 09/18/2012    Years since quitting: 11.2   Smokeless tobacco: Never  Vaping Use   Vaping status: Never Used  Substance Use Topics   Alcohol use: No   Drug use: No   Current Outpatient Medications  Medication Sig Dispense Refill   bismuth subsalicylate (PEPTO BISMOL) 262 MG/15ML suspension Take 30 mLs by mouth every 6 (six) hours as needed for indigestion.     diphenhydrAMINE  (BENADRYL ) 50 MG tablet Take 50 mg by mouth every 8 (eight) hours as needed for itching or allergies.     docusate sodium  (COLACE) 100 MG capsule Take 200 mg by mouth daily as needed (constipation.).     No current facility-administered medications for this visit.   Allergies  Allergen Reactions   Tylenol  [Acetaminophen ] Other (See Comments)    Told to avoid due to liver issues   Dust Mite Extract    Paroxetine  Other (See Comments)    Stomach issues, felt wrong, and GI Intolerance   Pollen Extract    Tramadol  Nausea Only    CBC    Component Value Date/Time   WBC 6.4  07/07/2023 0743   RBC 2.61 (L) 07/07/2023 0743   HGB 8.3 (L) 07/07/2023 0743   HGB 11.5 (L) 10/19/2022 1213   HCT 24.0 (L) 07/07/2023 0743   PLT 96 (L) 07/07/2023 0743   PLT 163 10/19/2022 1213   MCV 92.0 07/07/2023 0743   MCH 31.8 07/07/2023 0743   MCHC 34.6 07/07/2023 0743   RDW 15.6 (H) 07/07/2023 0743   LYMPHSABS 0.8 07/07/2023 0743   MONOABS 0.9 07/07/2023 0743  EOSABS 0.1 07/07/2023 0743   BASOSABS 0.0 07/07/2023 0743    CMP     Component Value Date/Time   NA 125 (L) 07/10/2023 0113   K 4.4 07/10/2023 0113   CL 95 (L) 07/10/2023 0113   CO2 22 07/10/2023 0113   GLUCOSE 107 (H) 07/10/2023 0113   BUN 29 (H) 07/10/2023 0113   CREATININE 1.43 (H) 07/10/2023 0113   CREATININE 1.11 07/07/2022 1354   CALCIUM  8.6 (L) 07/10/2023 0113   PROT 5.9 (L) 07/06/2023 0353   ALBUMIN  3.1 (L) 07/10/2023 0113   AST 47 (H) 07/06/2023 0353   ALT 38 07/06/2023 0353   ALKPHOS 96 07/06/2023 0353   BILITOT 2.7 (H) 07/06/2023 0353   GFRNONAA 59 (L) 07/10/2023 0113   GFRAA >60 02/11/2018 0722       Latest Ref Rng & Units 07/07/2023    7:43 AM 07/06/2023    3:53 AM 07/05/2023    3:56 AM  CBC EXTENDED  WBC 4.0 - 10.5 K/uL 6.4  7.0  8.3   RBC 4.22 - 5.81 MIL/uL 2.61  2.70  3.03   Hemoglobin 13.0 - 17.0 g/dL 8.3  8.4  9.5   HCT 62.1 - 52.0 % 24.0  24.3  26.9   Platelets 150 - 400 K/uL 96  88  107   NEUT# 1.7 - 7.7 K/uL 4.5   6.5   Lymph# 0.7 - 4.0 K/uL 0.8   0.7

## 2024-02-23 ENCOUNTER — Telehealth: Payer: Self-pay | Admitting: *Deleted

## 2024-02-23 NOTE — Telephone Encounter (Signed)
 Removed patient from hospital wait list.

## 2024-02-23 NOTE — Telephone Encounter (Signed)
 Contacted patient to advise that we have hospital availability to complete endoscopy (variceal screen) and colonoscopy (previous incomplete colon) on 03/18/24. Patient states that he will be unable to come for any procedures and will have to put it off until next year.  Patient assures me he will call our office back when he is in a position to get these tests completed.
